# Patient Record
Sex: Male | Born: 1935 | Race: White | Hispanic: No | Smoking: Never smoker
Health system: Southern US, Community
[De-identification: ages and names within clinical notes are randomized; demographics above are authoritative.]

## PROBLEM LIST (undated history)

## (undated) DIAGNOSIS — IMO0002 Reserved for concepts with insufficient information to code with codable children: Secondary | ICD-10-CM

## (undated) DIAGNOSIS — I509 Heart failure, unspecified: Secondary | ICD-10-CM

## (undated) DIAGNOSIS — I480 Paroxysmal atrial fibrillation: Secondary | ICD-10-CM

## (undated) DIAGNOSIS — Z95810 Presence of automatic (implantable) cardiac defibrillator: Secondary | ICD-10-CM

## (undated) DIAGNOSIS — Z77090 Contact with and (suspected) exposure to asbestos: Secondary | ICD-10-CM

## (undated) DIAGNOSIS — I10 Essential (primary) hypertension: Secondary | ICD-10-CM

## (undated) DIAGNOSIS — I2510 Atherosclerotic heart disease of native coronary artery without angina pectoris: Secondary | ICD-10-CM

## (undated) DIAGNOSIS — Z950 Presence of cardiac pacemaker: Secondary | ICD-10-CM

## (undated) DIAGNOSIS — I219 Acute myocardial infarction, unspecified: Secondary | ICD-10-CM

## (undated) DIAGNOSIS — R0602 Shortness of breath: Secondary | ICD-10-CM

## (undated) DIAGNOSIS — Z9289 Personal history of other medical treatment: Secondary | ICD-10-CM

## (undated) DIAGNOSIS — I442 Atrioventricular block, complete: Secondary | ICD-10-CM

## (undated) DIAGNOSIS — I255 Ischemic cardiomyopathy: Secondary | ICD-10-CM

## (undated) DIAGNOSIS — J449 Chronic obstructive pulmonary disease, unspecified: Secondary | ICD-10-CM

## (undated) DIAGNOSIS — I4821 Permanent atrial fibrillation: Secondary | ICD-10-CM

## (undated) DIAGNOSIS — E039 Hypothyroidism, unspecified: Secondary | ICD-10-CM

## (undated) DIAGNOSIS — M1990 Unspecified osteoarthritis, unspecified site: Secondary | ICD-10-CM

## (undated) HISTORY — DX: Ischemic cardiomyopathy: I25.5

## (undated) HISTORY — PX: CORONARY ANGIOPLASTY: SHX604

## (undated) HISTORY — DX: Atrioventricular block, complete: I44.2

## (undated) HISTORY — DX: Chronic obstructive pulmonary disease, unspecified: J44.9

## (undated) HISTORY — DX: Personal history of other medical treatment: Z92.89

## (undated) HISTORY — DX: Reserved for concepts with insufficient information to code with codable children: IMO0002

## (undated) HISTORY — DX: Contact with and (suspected) exposure to asbestos: Z77.090

## (undated) HISTORY — DX: Paroxysmal atrial fibrillation: I48.0

## (undated) HISTORY — DX: Permanent atrial fibrillation: I48.21

## (undated) HISTORY — PX: CATARACT EXTRACTION W/ INTRAOCULAR LENS  IMPLANT, BILATERAL: SHX1307

## (undated) HISTORY — PX: INGUINAL HERNIA REPAIR: SUR1180

## (undated) HISTORY — DX: Presence of cardiac pacemaker: Z95.0

---

## 1994-11-01 HISTORY — PX: CORONARY ARTERY BYPASS GRAFT: SHX141

## 1998-10-05 HISTORY — PX: INSERT / REPLACE / REMOVE PACEMAKER: SUR710

## 1998-10-10 ENCOUNTER — Encounter: Payer: Self-pay | Admitting: Family Medicine

## 1998-10-10 ENCOUNTER — Inpatient Hospital Stay (HOSPITAL_COMMUNITY): Admission: EM | Admit: 1998-10-10 | Discharge: 1998-10-22 | Payer: Self-pay | Admitting: *Deleted

## 1998-10-11 ENCOUNTER — Encounter: Payer: Self-pay | Admitting: Family Medicine

## 1998-10-15 ENCOUNTER — Encounter: Payer: Self-pay | Admitting: Cardiology

## 1998-10-16 ENCOUNTER — Encounter: Payer: Self-pay | Admitting: Internal Medicine

## 1998-10-21 ENCOUNTER — Encounter: Payer: Self-pay | Admitting: Cardiology

## 1999-04-24 ENCOUNTER — Emergency Department (HOSPITAL_COMMUNITY): Admission: EM | Admit: 1999-04-24 | Discharge: 1999-04-24 | Payer: Self-pay | Admitting: Emergency Medicine

## 2000-01-25 ENCOUNTER — Encounter: Admission: RE | Admit: 2000-01-25 | Discharge: 2000-01-25 | Payer: Self-pay | Admitting: Neurosurgery

## 2000-01-25 ENCOUNTER — Encounter: Payer: Self-pay | Admitting: Neurosurgery

## 2000-05-25 ENCOUNTER — Ambulatory Visit (HOSPITAL_COMMUNITY): Admission: RE | Admit: 2000-05-25 | Discharge: 2000-05-25 | Payer: Self-pay | Admitting: Internal Medicine

## 2001-12-14 ENCOUNTER — Encounter: Payer: Self-pay | Admitting: Cardiology

## 2001-12-14 ENCOUNTER — Encounter: Admission: RE | Admit: 2001-12-14 | Discharge: 2001-12-14 | Payer: Self-pay | Admitting: Cardiology

## 2001-12-17 ENCOUNTER — Ambulatory Visit (HOSPITAL_COMMUNITY): Admission: RE | Admit: 2001-12-17 | Discharge: 2001-12-18 | Payer: Self-pay | Admitting: Cardiology

## 2001-12-17 HISTORY — PX: CORONARY ANGIOPLASTY WITH STENT PLACEMENT: SHX49

## 2002-07-23 ENCOUNTER — Ambulatory Visit (HOSPITAL_COMMUNITY): Admission: RE | Admit: 2002-07-23 | Discharge: 2002-07-23 | Payer: Self-pay | Admitting: Cardiology

## 2002-07-23 ENCOUNTER — Encounter: Payer: Self-pay | Admitting: Cardiology

## 2004-01-06 ENCOUNTER — Encounter: Admission: RE | Admit: 2004-01-06 | Discharge: 2004-01-06 | Payer: Self-pay | Admitting: Internal Medicine

## 2005-03-23 ENCOUNTER — Inpatient Hospital Stay (HOSPITAL_COMMUNITY): Admission: AD | Admit: 2005-03-23 | Discharge: 2005-03-25 | Payer: Self-pay | Admitting: *Deleted

## 2006-03-12 ENCOUNTER — Inpatient Hospital Stay (HOSPITAL_COMMUNITY): Admission: EM | Admit: 2006-03-12 | Discharge: 2006-03-17 | Payer: Self-pay | Admitting: Obstetrics and Gynecology

## 2006-03-13 ENCOUNTER — Encounter (INDEPENDENT_AMBULATORY_CARE_PROVIDER_SITE_OTHER): Payer: Self-pay | Admitting: *Deleted

## 2006-03-13 ENCOUNTER — Encounter (INDEPENDENT_AMBULATORY_CARE_PROVIDER_SITE_OTHER): Payer: Self-pay | Admitting: Cardiology

## 2006-03-15 ENCOUNTER — Encounter: Payer: Self-pay | Admitting: *Deleted

## 2006-03-16 HISTORY — PX: CARDIAC CATHETERIZATION: SHX172

## 2006-04-17 ENCOUNTER — Ambulatory Visit: Payer: Self-pay | Admitting: Internal Medicine

## 2006-04-27 ENCOUNTER — Ambulatory Visit (HOSPITAL_COMMUNITY): Admission: RE | Admit: 2006-04-27 | Discharge: 2006-04-27 | Payer: Self-pay | Admitting: Internal Medicine

## 2006-04-28 ENCOUNTER — Ambulatory Visit (HOSPITAL_COMMUNITY): Admission: RE | Admit: 2006-04-28 | Discharge: 2006-04-28 | Payer: Self-pay | Admitting: Internal Medicine

## 2006-05-08 ENCOUNTER — Ambulatory Visit: Payer: Self-pay | Admitting: Internal Medicine

## 2006-06-07 ENCOUNTER — Encounter: Admission: RE | Admit: 2006-06-07 | Discharge: 2006-06-07 | Payer: Self-pay | Admitting: Gastroenterology

## 2006-07-10 ENCOUNTER — Ambulatory Visit: Payer: Self-pay | Admitting: Internal Medicine

## 2006-07-12 ENCOUNTER — Ambulatory Visit: Payer: Self-pay | Admitting: Cardiology

## 2006-11-28 ENCOUNTER — Ambulatory Visit: Payer: Self-pay | Admitting: Internal Medicine

## 2006-12-04 ENCOUNTER — Encounter: Admission: RE | Admit: 2006-12-04 | Discharge: 2006-12-04 | Payer: Self-pay | Admitting: Cardiology

## 2006-12-07 ENCOUNTER — Ambulatory Visit (HOSPITAL_COMMUNITY): Admission: RE | Admit: 2006-12-07 | Discharge: 2006-12-07 | Payer: Self-pay | Admitting: Cardiology

## 2007-01-08 ENCOUNTER — Ambulatory Visit: Payer: Self-pay | Admitting: Internal Medicine

## 2007-01-16 ENCOUNTER — Ambulatory Visit (HOSPITAL_COMMUNITY): Admission: RE | Admit: 2007-01-16 | Discharge: 2007-01-16 | Payer: Self-pay | Admitting: Gastroenterology

## 2007-01-16 ENCOUNTER — Encounter (INDEPENDENT_AMBULATORY_CARE_PROVIDER_SITE_OTHER): Payer: Self-pay | Admitting: *Deleted

## 2007-02-22 ENCOUNTER — Encounter: Admission: RE | Admit: 2007-02-22 | Discharge: 2007-02-22 | Payer: Self-pay | Admitting: Internal Medicine

## 2007-05-02 IMAGING — CR DG CHEST 2V
2 series · 2 of 2 positions shown · non-contrast
Comparison: none

CLINICAL DATA: Post pacer; some left arm pain.  
 [id number] VIEWS:
 PA and lateral views of the chest are made and are compared to previous studies of 03/23/05 and show a new pacemaker battery pack and lead to have been inserted.  New lead is in the right ventricle.  There is another lead also in the right ventricle and one in the right atrium.  There is no pneumothorax or pleural effusion.  The patient has had previous coronary artery bypass grafts.  The heart is minimally prominent in size but no edema is present.  There is minimal blunting of the left costophrenic angle.  There is a stable compression fracture of the mid thoracic spine.

[w chest pa]
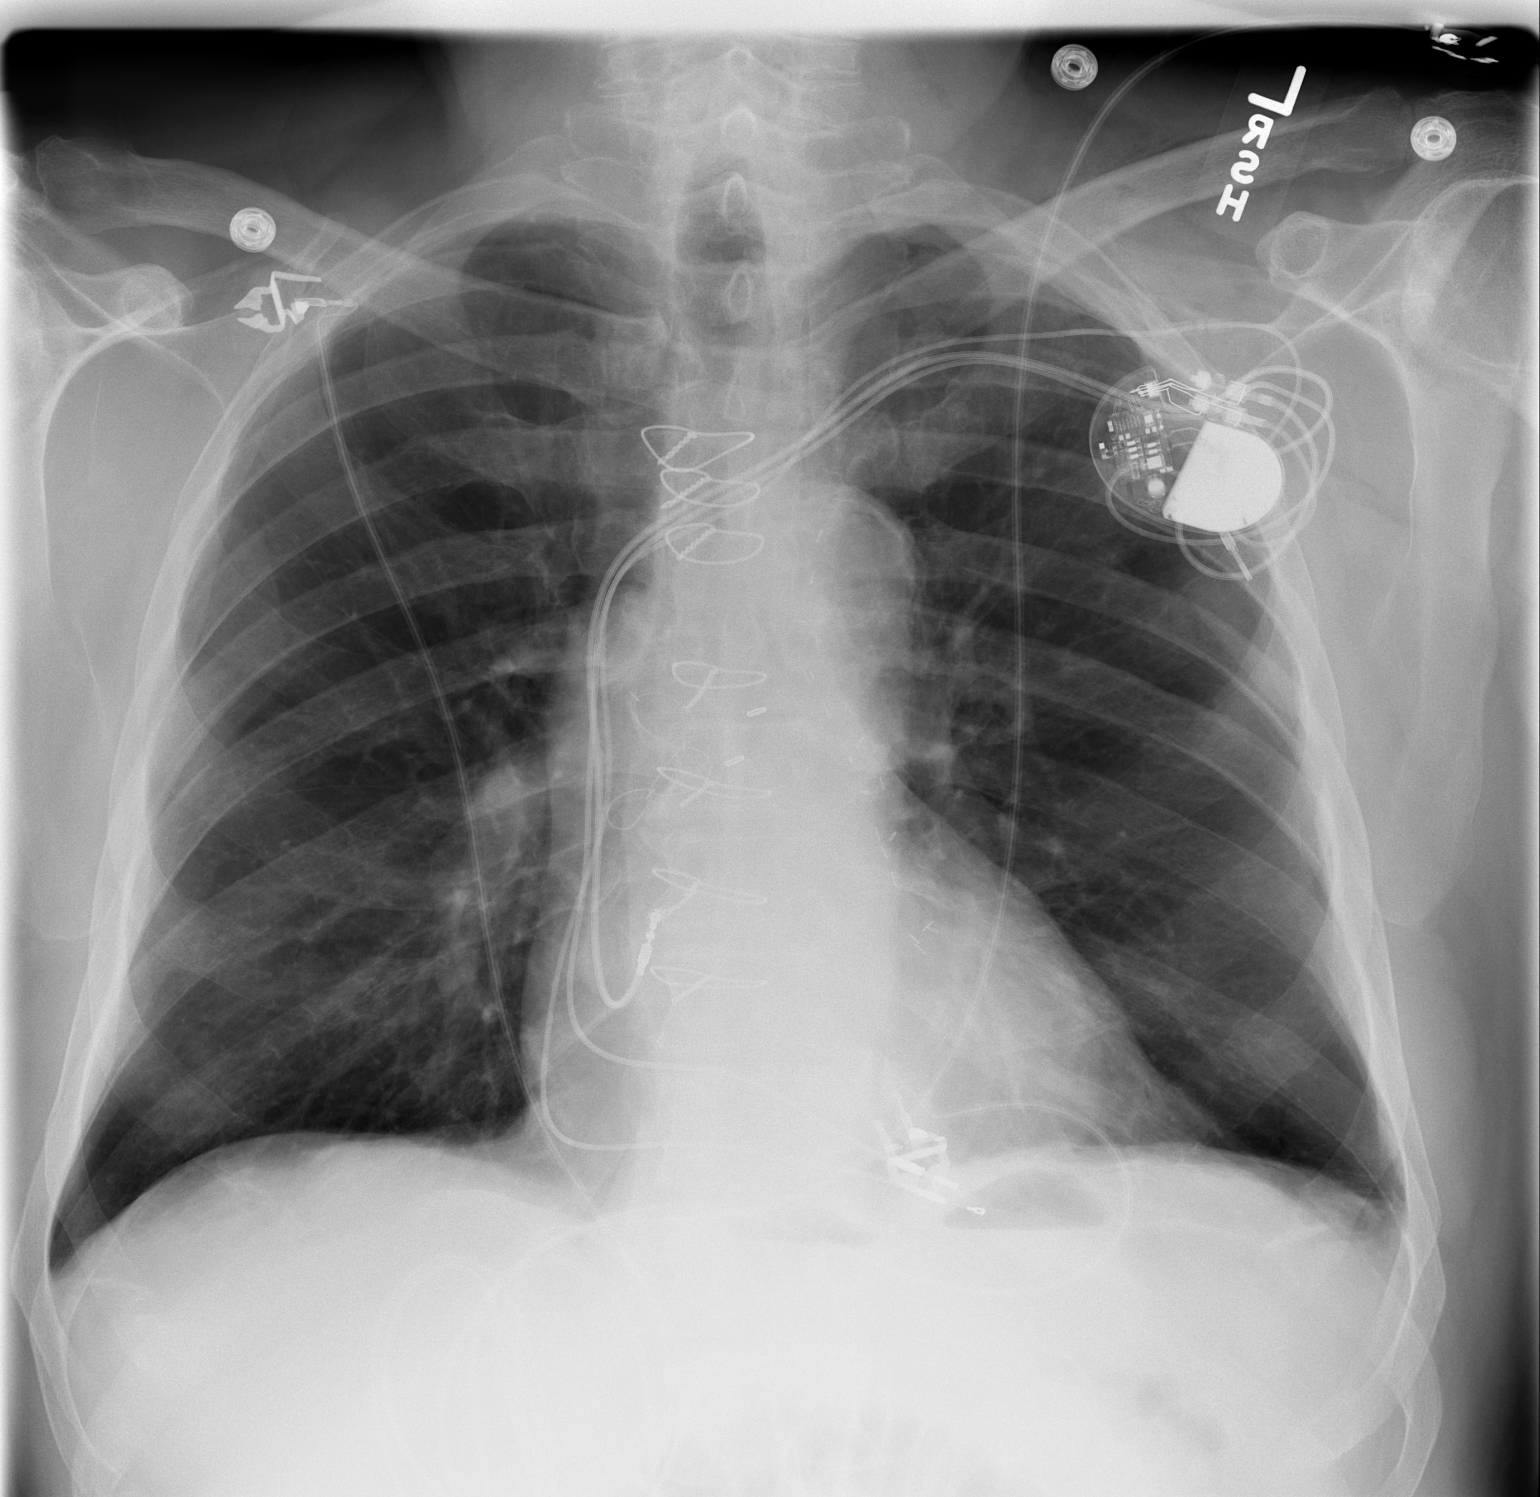

[w chest lat]
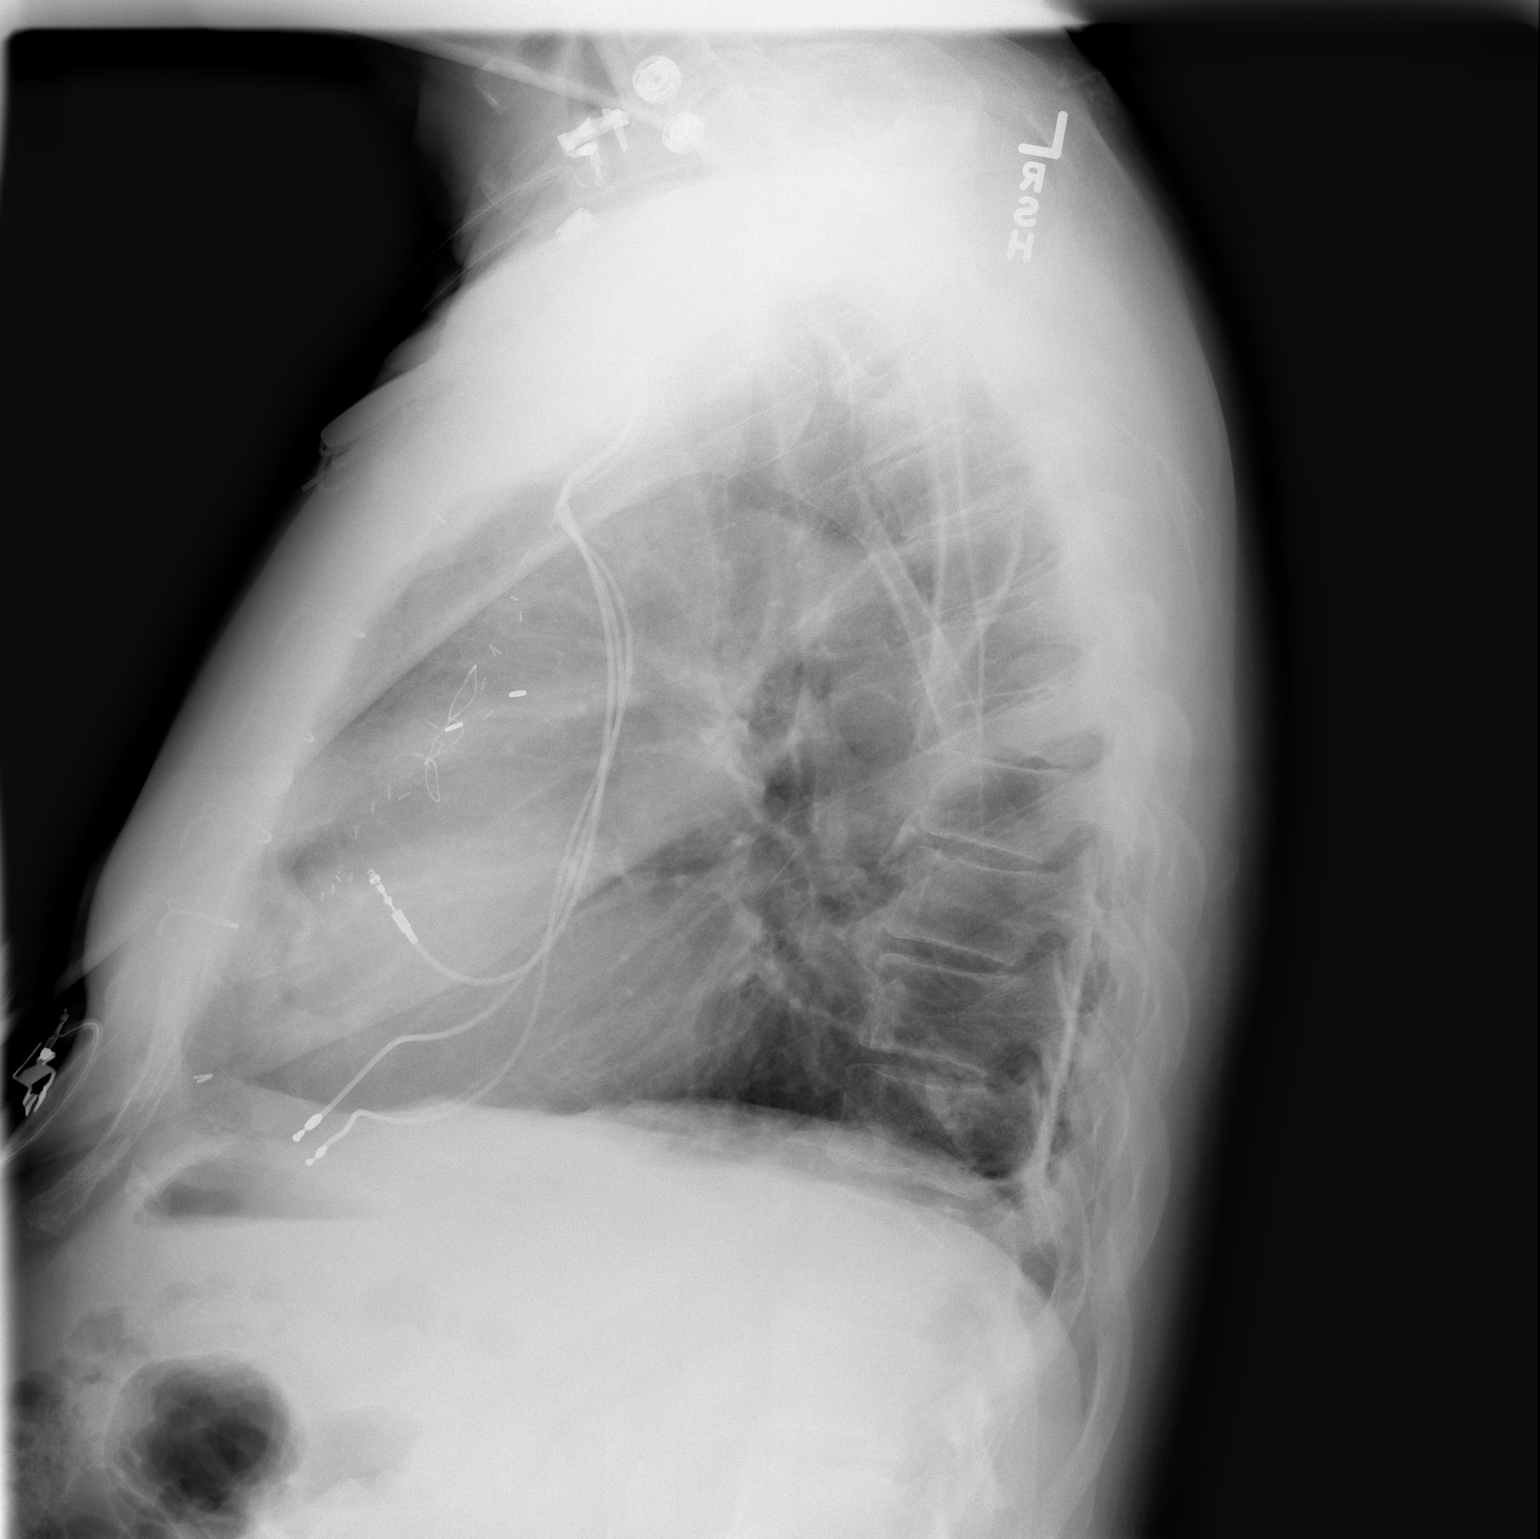

[2 of 2 positions shown; findings below may reference images not displayed]

IMPRESSION: New pacer and electrode appear to be in good position.  No definite pneumothorax.  Slight blunting, left costophrenic angle.  Stable anterior compression, mid thoracic vertebral body.

## 2007-05-14 ENCOUNTER — Ambulatory Visit: Payer: Self-pay | Admitting: Internal Medicine

## 2007-06-11 ENCOUNTER — Encounter: Admission: RE | Admit: 2007-06-11 | Discharge: 2007-06-11 | Payer: Self-pay | Admitting: Internal Medicine

## 2007-07-09 ENCOUNTER — Ambulatory Visit: Payer: Self-pay | Admitting: Internal Medicine

## 2007-09-17 ENCOUNTER — Ambulatory Visit (HOSPITAL_COMMUNITY): Admission: RE | Admit: 2007-09-17 | Discharge: 2007-09-17 | Payer: Self-pay | Admitting: Gastroenterology

## 2007-09-17 ENCOUNTER — Encounter (INDEPENDENT_AMBULATORY_CARE_PROVIDER_SITE_OTHER): Payer: Self-pay | Admitting: Gastroenterology

## 2007-10-08 ENCOUNTER — Ambulatory Visit: Payer: Self-pay | Admitting: Internal Medicine

## 2007-11-05 ENCOUNTER — Encounter: Admission: RE | Admit: 2007-11-05 | Discharge: 2007-11-05 | Payer: Self-pay | Admitting: Cardiology

## 2007-11-12 ENCOUNTER — Encounter: Payer: Self-pay | Admitting: Internal Medicine

## 2007-11-15 HISTORY — PX: CARDIAC CATHETERIZATION: SHX172

## 2007-11-21 ENCOUNTER — Encounter: Payer: Self-pay | Admitting: Internal Medicine

## 2007-11-22 ENCOUNTER — Encounter: Payer: Self-pay | Admitting: Internal Medicine

## 2007-11-26 ENCOUNTER — Encounter: Payer: Self-pay | Admitting: Internal Medicine

## 2007-11-26 ENCOUNTER — Ambulatory Visit: Payer: Self-pay | Admitting: Internal Medicine

## 2007-11-26 ENCOUNTER — Inpatient Hospital Stay (HOSPITAL_COMMUNITY): Admission: RE | Admit: 2007-11-26 | Discharge: 2007-11-28 | Payer: Self-pay | Admitting: Cardiology

## 2007-12-07 DIAGNOSIS — I2510 Atherosclerotic heart disease of native coronary artery without angina pectoris: Secondary | ICD-10-CM

## 2007-12-07 DIAGNOSIS — J45909 Unspecified asthma, uncomplicated: Secondary | ICD-10-CM | POA: Insufficient documentation

## 2007-12-07 DIAGNOSIS — J61 Pneumoconiosis due to asbestos and other mineral fibers: Secondary | ICD-10-CM

## 2007-12-07 DIAGNOSIS — R0602 Shortness of breath: Secondary | ICD-10-CM | POA: Insufficient documentation

## 2007-12-07 DIAGNOSIS — D71 Functional disorders of polymorphonuclear neutrophils: Secondary | ICD-10-CM

## 2007-12-10 ENCOUNTER — Ambulatory Visit: Payer: Self-pay | Admitting: Internal Medicine

## 2007-12-13 ENCOUNTER — Ambulatory Visit: Payer: Self-pay

## 2008-01-15 ENCOUNTER — Encounter: Payer: Self-pay | Admitting: Internal Medicine

## 2008-02-26 ENCOUNTER — Encounter: Payer: Self-pay | Admitting: Internal Medicine

## 2008-03-25 ENCOUNTER — Ambulatory Visit: Payer: Self-pay | Admitting: Internal Medicine

## 2008-06-09 ENCOUNTER — Ambulatory Visit: Payer: Self-pay | Admitting: Internal Medicine

## 2008-10-06 ENCOUNTER — Encounter: Payer: Self-pay | Admitting: Internal Medicine

## 2008-12-01 ENCOUNTER — Encounter: Admission: RE | Admit: 2008-12-01 | Discharge: 2008-12-01 | Payer: Self-pay | Admitting: Internal Medicine

## 2008-12-01 ENCOUNTER — Ambulatory Visit: Payer: Self-pay | Admitting: Internal Medicine

## 2009-01-08 ENCOUNTER — Encounter: Payer: Self-pay | Admitting: Internal Medicine

## 2009-02-23 ENCOUNTER — Ambulatory Visit: Payer: Self-pay | Admitting: Internal Medicine

## 2009-03-03 ENCOUNTER — Ambulatory Visit: Payer: Self-pay | Admitting: Internal Medicine

## 2009-03-16 ENCOUNTER — Encounter: Payer: Self-pay | Admitting: Internal Medicine

## 2009-07-02 ENCOUNTER — Encounter: Admission: RE | Admit: 2009-07-02 | Discharge: 2009-07-02 | Payer: Self-pay | Admitting: Internal Medicine

## 2009-07-02 ENCOUNTER — Ambulatory Visit: Payer: Self-pay | Admitting: Internal Medicine

## 2009-07-03 ENCOUNTER — Ambulatory Visit: Payer: Self-pay | Admitting: Internal Medicine

## 2009-07-09 ENCOUNTER — Ambulatory Visit: Payer: Self-pay | Admitting: Internal Medicine

## 2009-07-23 ENCOUNTER — Encounter: Admission: RE | Admit: 2009-07-23 | Discharge: 2009-07-23 | Payer: Self-pay | Admitting: Internal Medicine

## 2009-07-23 ENCOUNTER — Ambulatory Visit: Payer: Self-pay | Admitting: Internal Medicine

## 2009-08-24 ENCOUNTER — Encounter: Payer: Self-pay | Admitting: Internal Medicine

## 2009-08-24 ENCOUNTER — Encounter: Admission: RE | Admit: 2009-08-24 | Discharge: 2009-08-24 | Payer: Self-pay | Admitting: Internal Medicine

## 2009-08-24 ENCOUNTER — Ambulatory Visit: Payer: Self-pay | Admitting: Internal Medicine

## 2009-10-14 ENCOUNTER — Encounter: Payer: Self-pay | Admitting: Internal Medicine

## 2009-11-10 ENCOUNTER — Ambulatory Visit: Payer: Self-pay | Admitting: Internal Medicine

## 2010-01-22 ENCOUNTER — Encounter: Payer: Self-pay | Admitting: Internal Medicine

## 2010-02-01 ENCOUNTER — Ambulatory Visit: Payer: Self-pay | Admitting: Internal Medicine

## 2010-05-05 ENCOUNTER — Encounter: Payer: Self-pay | Admitting: Internal Medicine

## 2010-08-30 ENCOUNTER — Ambulatory Visit: Payer: Self-pay | Admitting: Internal Medicine

## 2010-09-08 ENCOUNTER — Encounter: Payer: Self-pay | Admitting: Internal Medicine

## 2010-10-06 ENCOUNTER — Encounter: Payer: Self-pay | Admitting: Internal Medicine

## 2010-10-06 ENCOUNTER — Encounter: Admission: RE | Admit: 2010-10-06 | Discharge: 2010-10-06 | Payer: Self-pay | Admitting: Cardiology

## 2010-10-12 ENCOUNTER — Ambulatory Visit (HOSPITAL_COMMUNITY)
Admission: RE | Admit: 2010-10-12 | Discharge: 2010-10-12 | Payer: Self-pay | Source: Home / Self Care | Admitting: Cardiology

## 2010-10-12 HISTORY — PX: CARDIOVERSION: SHX1299

## 2010-10-14 ENCOUNTER — Encounter: Payer: Self-pay | Admitting: Internal Medicine

## 2010-12-14 NOTE — Assessment & Plan Note (Signed)
Summary: rov/apc   Primary Provider/Referring Provider:  [place]/ Al Little  CC:  Follow up , pt had pacemaker replaced Jan. 2010 , and denies sob at this time.  History of Present Illness: 06/02/08-77 year old man returning for follow-up.  History of asthma/COPD, old granulomatous, pulmonary asbestosis, and coronary disease.  He has felt stable in the hot weather, with no new problems.  Pacemaker change made a big help for him as measured by his ability to go dancing.  He coughs up some phlegm from time to time, mostly in the mornings, mostly white.  He denies chest pain, palpitation, fever, purulent or bloody discharge, adenopathy, or leg edema. Pulmonary function testing in 2007 had indicated obstructive disease and his methacholine challenge test was positive, then.  His current study shows mild obstruction in small airways without response to bronchodilator.  FEV1 2.33/86%.  FEV1/FVC 0.67.  Small airway flows are at 50% of predicted.  Lung volumes are normal.  Diffusion is normal.  On a 6 minute walk test he went 516 m with saturations of 98%, 96%, 99% on room air.  This is quite good.  2010-02-20- Asthma/ COPD, old granulomatous diseae, Asbestosis, CAD Comes for F/U. Had a chest cold this winter but not full Flu syndrome. Otherwise has done very well. Says pacemaker change "cured my problems". Continues Amiodarone. Had CXR this year. Not much pollen rhinitis. Denies cough, tightness, wheeze, phlegm. Denies blood or chest pain. PFT 06/09/08- Mild bobstructive disease, FEV1/FVC, without response to BD. CXR 08/24/09- No residual infiltrate to suggest any amiodarone disease. Healing left rib fx, Stable CE, pacemaker.  Current Medications (verified): 1)  Symbicort 160-4.5 Mcg/act Aero (Budesonide-Formoterol Fumarate) .... Inhale 2 Puff Using Inhaler Twice A Day 2)  Synthroid 100 Mcg  Tabs (Levothyroxine Sodium) .... Take 1 Tablet By Mouth Once A Day 3)  Lipitor 10 Mg  Tabs (Atorvastatin  Calcium) .... Take 1 Tablet By Mouth Once A Day 4)  Vitamin E 400 Unit  Caps (Vitamin E) .... Take 1 Capsule By Mouth Once A Day 5)  Coq-10 100 Mg  Caps (Coenzyme Q10) .... Take 1 Capsule By Mouth Once A Day 6)  Lumigan 0.03 %  Soln (Bimatoprost) .... As Directed 7)  Cosopt 2-0.5 %  Soln (Dorzolamide-Timolol) .... As Directed 8)  Furosemide 80 Mg  Tabs (Furosemide) .... Take 1 Tablet By Mouth Once A Day 9)  Amiodarone Hcl 200 Mg  Tabs (Amiodarone Hcl) .... Take 1 Tablet By Mouth Once A Day 10)  Ventolin Hfa 108 (90 Base) Mcg/act  Aers (Albuterol Sulfate) .... As Needed 11)  Coumadin .... As Directed 12)  Captopril 25 Mg  Tabs (Captopril) .... 1/2 Two Times A Day 13)  Digoxin 0.125 Mg  Tabs (Digoxin) .... Once Daily 14)  Cvs Vitamin E 400 Unit  Caps (Vitamin E) .... Once Daily  Allergies (verified): No Known Drug Allergies  Past History:  Past Medical History: Last updated: 12/10/2007 ischemic cardiomyopathy-St. Jude BiV ICD Hx paroxysmal atrial fib-sinus rhythm on amiodarone COIPD Hx of asbestos exposure/ plaques on CT T7 compression fx inflammatory microdules in lung, resolved as of follow up CT 07/12/06  Family History: Last updated: 2010/02/20 Father- died MI age 43 Mother- died 25 old age  Social History: Last updated: 02/20/10 Patient never smoked.  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    Vital Signs:  Patient profile:    77 year old male Height:      71 inches Weight:      182.2 pounds BMI:     25.50 O2 Sat:      96 % on Room air Pulse rate:   102 / minute BP sitting:   110 / 68  (left arm)  Vitals Entered By: Renold Genta RCP, LPN (February 01, 2010 10:33 AM)  O2 Flow:  Room air CC: Follow up , pt had pacemaker replaced Jan. 2010 , denies sob at this time Comments Medications reviewed with patient Renold Genta RCP, LPN  February 01, 2010 10:44 AM    Physical Exam  Additional Exam:  GENERAL:  A/Ox3; pleasant & cooperative.NAD HEENT:  [place]/AT, EOM-wnl, PERRLA, EACs-clear, TMs-wnl, NOSE-clear, THROAT-clear & wnl. NECK:  Supple w/ fair ROM; no JVD; normal carotid impulses w/o bruits; no thyromegaly or nodules palpated; no lymphadenopathy. CHEST: Unlabored with bibasilar crackle. Room air sat 96%. HEART:  RRR, no m/r/g  heard (paced) ABDOMEN:  Soft & nt;  EXT: Warm bilat,  no calf pain, edema, clubbing, pulses intact Skin: no rash/lesion     Impression & Recommendations:  Problem # 1:  COPD (ICD-496) Never smoker with mild fixed small airway obstruction. We reviewed PFT. He does use Symbicort. Im not sure it is cost effective so we will try wihout it, but refill for now.  Problem # 2:  PULMONARY ASBESTOSIS (ICD-501)  Basilar crackles with previous imaging showing plaques. He had a CXR not showing interstitial disease from either amiodarone or asbedstos.  Other Orders: Est. Patient Level III (16109)  Patient Instructions: 1)  Please schedule a follow-up appointment in 1 year. 2)  We are refilling Symbicort. It would be ok to try doing without Symbicort to see if you really need. If you start noting cough, wheeze, shest tightness, then ok to restart. 3)  We will get your chest xray report to add to our file. Prescriptions: SYMBICORT 160-4.5 MCG/ACT AERO (BUDESONIDE-FORMOTEROL FUMARATE) Inhale 2 puff using inhaler twice a day  #3 x 3   Entered and Authorized by:   Waymon Budge MD   Signed  by:   Waymon Budge MD on 02/01/2010   Method used:   Print then Give to Patient   RxID:   [id number]

## 2010-12-14 NOTE — Letter (Signed)
Summary: [facility]   Imported By: Sherian Rein 09/25/2010 11:40:09  _____________________________________________________________________  External Attachment:    Type:   Image     Comment:   External Document

## 2010-12-14 NOTE — Letter (Signed)
Summary: [facility]  [facility]   Imported By: [name] Charlack 10/22/2010 11:11:51  _____________________________________________________________________  External Attachment:    Type:   Image     Comment:   External Document

## 2010-12-14 NOTE — Letter (Signed)
Summary: [facility]  [facility]   Imported By: Sherian Rein 02/15/2010 12:12:02  _____________________________________________________________________  External Attachment:    Type:   Image     Comment:   External Document

## 2010-12-14 NOTE — Letter (Signed)
Summary: [facility]  [facility]   Imported By: Sherian Rein 05/13/2010 07:51:42  _____________________________________________________________________  External Attachment:    Type:   Image     Comment:   External Document

## 2010-12-16 NOTE — Letter (Signed)
Summary: [facility]  [facility]   Imported By: [name] Chester 11/01/2010 08:39:07  _____________________________________________________________________  External Attachment:    Type:   Image     Comment:   External Document

## 2010-12-28 ENCOUNTER — Ambulatory Visit: Payer: Medicare Other | Admitting: Internal Medicine

## 2010-12-28 DIAGNOSIS — I2589 Other forms of chronic ischemic heart disease: Secondary | ICD-10-CM

## 2010-12-28 DIAGNOSIS — J209 Acute bronchitis, unspecified: Secondary | ICD-10-CM

## 2011-01-17 ENCOUNTER — Other Ambulatory Visit: Payer: Medicare Other | Admitting: Internal Medicine

## 2011-01-25 LAB — PROTIME-INR: Prothrombin Time: 28.3 seconds — ABNORMAL HIGH (ref 11.6–15.2)

## 2011-01-31 ENCOUNTER — Other Ambulatory Visit: Payer: Self-pay | Admitting: Dermatology

## 2011-03-29 NOTE — Op Note (Signed)
[name], [name]               ACCOUNT NO.:  [id number]   MEDICAL RECORD NO.:  [id number]          PATIENT TYPE:  OIB   LOCATION:  2037                         FACILITY:  [facility]   PHYSICIAN:  Doylene Canning. Ladona Ridgel, MD    DATE OF BIRTH:  Sep 21, 1926   DATE OF PROCEDURE:  11/27/2007  DATE OF DISCHARGE:                               OPERATIVE REPORT   PROCEDURE PERFORMED:  Implantation of a biventricular implantable cardio-  defibrillator with ICD pocket revision.   INDICATION FOR PROCEDURE:  The patient is an 77 year old male with  longstanding complete heart block and ischemic cardiomyopathy and  congestive heart failure, who had worsening symptoms over the last  several weeks.  He was admitted to hospital where he was found to have  congestive heart failure, but for the most part patent grafts.  He has  chronic renal insufficiency.  He is now referred for upgrade from his  dual-chamber pacemaker to his biventricular ICD.   PROCEDURE IN DETAIL:  After informed consent was obtained, the patient  was taken to the diagnostic EP lab in a fasting state.  After the usual  preparation and draping, intravenous fentanyl and midazolam was given  for sedation.  Next, 30 mL of lidocaine was infiltrated in the left  infraclavicular region.  A 7-cm incision was carried out over this  region and electrocautery was utilized to dissect down to the fascial  plane..  Care was taken not to enter the pacemaker pocket.  Next, 10 mL  of contrast was injected into the left upper extremity venous system  demonstrating a patent left subclavian vein.  It was subsequently  punctured x2 and the St. Jude, model 7121, 65-cm active fixation  defibrillation lead, serial number [id number], was advanced into the right  ventricle.  Mapping was carried out in the final site on the RV septum  and the R-waves measured 15.  Of note, at other locations the R-waves  were much decreased.  With these satisfactory parameters and with  the  lead actively fixed and threshold of 0.6 volts at 0.5 milliseconds, the  guiding EP catheter was advanced into the right atrium and the coronary  sinus was cannulated without particular difficulty.  Venography was  carried out in the coronary sinus demonstrating a posterior vein which  coursed laterally as well as a lateral vein that was fairly high lateral  and which was small.  Initial attempts to cannulate the lateral vein  were successful and a guidewire was advanced throughout the vein,  through the anastomoses and out actually back into the coronary sinus.  The initial attempts to place the [facility]. Jude quick flex 1156 passive  fixation lead were unsuccessful because of the small diameter of the  vein itself.  At this point, the lead was removed from the vein and the  guiding catheter withdrawn back into the portion near the os of the  coronary sinus.  In this location, the posterior vein was cannulated  without difficulty and advanced laterally onto the posterolateral wall  of left ventricle, a distance approximately two-thirds from base to  apex.  In this location, the LV threshold was 18, the impedance 480 ohms  and the threshold 2 V at 0.5 ms.  It was noted that the LV waves were  18.  The 10-volt pacing in this location did not stimulate the  diaphragm.  With both RV and LV leads in satisfactory position, they  were secured to the subpectoralis fascia with a figure-of-eight silk  suture.  The sewing sleeve was also secured with a silk suture.  At this  point, the previous subcutaneous pocket was entered and the old St. Jude  dual-chamber pacemaker was removed.  The old RV lead was capped.  The  atrial lead was evaluated and working satisfactorily with P-waves of 3  in a threshold 0.75 at 0.5, and at this point the pocket was irrigated  and electrocautery was utilized to assure hemostasis.  At this point,  having done so, the pocket was revised to accommodate the new   defibrillator lead, the new LV pacing lead and the new defibrillator  itself.  Having accomplished this, the St. Jude Promote RF, model [id number],  biventricular  ICD, serial number [id number], was advanced and connected to  the old right atrial, the new defibrillator lead in the RV and the new  LV lead and placed back in the subcutaneous pocket.  The pocket was then  irrigated with kanamycin.  Defibrillation threshold testing was carried  out.   After the patient was more deeply sedated with fentanyl and Versed, the  VF was induced with T-wave shock and a 15-joule shock which terminated  VF and restored sinus rhythm.  At this point, no additional  defibrillation threshold testing was carried out and the incision was  closed with a layer of 2-0 Vicryl, followed by a layer of 3-0 Vicryl.  Benzoin was painted on the skin, Steri-Strips were applied, a pressure  dressing was placed, and the patient was returned to his room in  satisfactory condition.   COMPLICATIONS:  There were no immediate complications.  Full results  demonstrated successful implantation of a St. Jude biventricular ICD,  utilizing a previously implanted right atrial pacing lead without  immediate procedure complication.      Doylene Canning. Ladona Ridgel, MD  Electronically Signed     GWT/MEDQ  D:  11/27/2007  T:  11/27/2007  Job:  [id number]   cc:   Thereasa Solo. Little, M.D.

## 2011-03-29 NOTE — Discharge Summary (Signed)
NAMETYDUS, SANMIGUEL               ACCOUNT NO.:  [id number]   MEDICAL RECORD NO.:  [id number]          PATIENT TYPE:  INP   LOCATION:  2037                         FACILITY:  [facility]   PHYSICIAN:  [name], P.A.   DATE OF BIRTH:  [birth year]   DATE OF ADMISSION:  11/26/2007  DATE OF DISCHARGE:  11/28/2007                               DISCHARGE SUMMARY   DISCHARGE DIAGNOSES:  1. Ischemic cardiomyopathy, status post upgrade to [facility]. Jude BiV ICD      this admission for worsening heart failure.  2. Coronary disease with history of coronary artery bypass grafting in      1995, catheterization this admission revealing patent grafts.  3. History of paroxysmal atrial fibrillation, sinus rhythm on      amiodarone.  4. History of chronic obstructive pulmonary disease with prior      asbestos exposure followed by Dr. Fannie Knee.   HOSPITAL COURSE:  Mr. [name] is a pleasant 77 year old male who was  seen by Dr. Clarene Duke in the office on November 22, 2007.  He had been having  increasing dyspnea and some chest pressure.  His previous EF had been in  the 35% range.  He was admitted for diagnostic catheterization and  further evaluation.  He was also seen in consult by the EP service for  evaluation of possible BiV ICD upgrade.  The patient had had a pacemaker  implanted in the past for heart block.  Catheterization was done by Dr.  Jenne Campus on November 26, 2007.  His grafts were patent.  His EF is 20-25%.  The EP service feels he has New York Heart Association class II to III  heart failure.  He was seen in consult by Dr. Ladona Ridgel.  His Coumadin had  been held prior to admission.  We went ahead and proceeded with a pacer  change out and upgrade to a BiV ICD with a St. Jude device.  This was  done on November 27, 2007.  The patient tolerated the procedure well.  We  feel he can be discharged on November 28, 2007.   DISCHARGE MEDICATIONS:  1. Amiodarone 200 mg a day.  2. Coreg 3.125 mg twice a  day.  3. Symbicort 2 puffs b.i.d.  4. Ventolin inhaler as taken at home.  5. Xopenex inhaler p.r.n.  6. Ipratropium as taken at home.  7. Spiriva daily.  8. Lasix 80 mg a day.  9. Potassium 10 mEq a day  10.Synthroid 0.1 mg a day.  11.Lipitor 10 mg a day.  12 . Coumadin 5 mg a day.   FOLLOWUP:  Dr. Ladona Ridgel would like to see him in follow up for a site  check on December 13, 2007 at 9:20, and then he will see Dr. Ladona Ridgel on  March 06, 2008.  He will follow up with Dr. Clarene Duke in a couple weeks.   LABORATORY DATA:  White count 4.7, hemoglobin 11.9, hematocrit 35.3,  platelets 126.  Sodium 139, potassium 3.4, BUN 20, creatinine 1.38.  BNP  is 1137.  TSH 5.5.  Chest x-ray shows no pneumothorax at discharge.  INR  at admission was 1.5.   DISPOSITION:  The patient is discharged in stable condition.  We did add  Lanoxin.  He can resume his Coumadin tonight and will get a pro time in  a week.  Keflex was added at discharge by the EP service.      [name], P.A.     LKK/MEDQ  D:  11/28/2007  T:  11/28/2007  Job:  [id number]   cc:   Doylene Canning. Ladona Ridgel, MD

## 2011-03-29 NOTE — Op Note (Signed)
NAME:  [name], [name]               ACCOUNT NO.:  [id number]   MEDICAL RECORD NO.:  [id number]          PATIENT TYPE:  AMB   LOCATION:  ENDO                         FACILITY:  [facility]   PHYSICIAN:  Petra Kuba, M.D.    DATE OF BIRTH:  11-Nov-1926   DATE OF PROCEDURE:  DATE OF DISCHARGE:                               OPERATIVE REPORT   PROCEDURES:  Colonoscopy and polypectomy.   INDICATION:  A patient with history of multiple difficult to remove  polyps due for repeat screening.  Consent was signed after risks,  benefits, methods, options thoroughly discussed multiple times in the  past.   MEDICINES USED:  Fentanyl 75 mcg, Versed 10.5 mg.   PROCEDURE IN DETAIL:  Rectal inspection is pertinent for external  hemorrhoids, small.  Digital exam is negative.  The video colonoscope  was inserted easily and advanced around the colon to the cecum.  This  did require some abdominal pressure but no position changes.  No  worrisome lesions were seen on insertion.  A moderate number of  diverticula in both left and right were seen on insertion.  The cecum  was identified by the appendiceal orifice and the ileocecal valve.  On  slow withdrawal through the colon probably 5 small polyps and one medium  sessile polyp were all snared in the right side of the colon.  All were  put in the same container.  All pieces removed were suctioned through  the scope and collected in the trap.  We did piecemeal the moderate  ascending one which was along the poles with 3 small snares.  The scope  was further withdrawn.  In the transverse approximately 5 other small  polyps were seen, one was a sessile one possibly even residual from one  of the previous polypectomies and all were snared and two were hot  biopsied including the sessile one and put in a second container.  No  worrisome lesions were seen as we slowly withdrew back to the rectum.  We did look at the right side with him in the transverse and several  on  his back and his left side.  No additional polyps or significant  findings were seen.  The scope was slowly withdrawn.  Anal rectal pull  through and retroflexion back in the rectum confirmed some small  hemorrhoids.  Scope was straightened and readvanced a short ways up the  left side of the colon.  Air was suctioned.  Scope was removed.  The  patient tolerated the procedure well.  There was no obvious immediate  complication.   ENDOSCOPIC DIAGNOSES:  1. Internal and external small hemorrhoids.  2. Left and right moderate diverticula.  3. Multiple small with two medium sized sessile polyps status post      multiple snares and a few hot biopsies in both the transverse and      the ascending colon.  4. Otherwise within normal limits to the cecum.   PLAN:  Await pathology and consider repeat colonic screening pending his  medical problems, otherwise, observe for delayed complications and if  none, slowly  advanced Coumadin, watch for signs of bleeding and happy to  see back p.r.n.           ______________________________  Petra Kuba, M.D.     MEM/MEDQ  D:  09/17/2007  T:  09/17/2007  Job:  [id number]   cc:   Petra Kuba, M.D.  Thereasa Solo. Little, M.D.  Luanna Cole. Lenord Fellers, M.D.

## 2011-03-29 NOTE — Assessment & Plan Note (Signed)
[facility]                             PULMONARY OFFICE NOTE   NAME:Sipos, ZIGMOND TRELA                      MRN:          [id number]  DATE:07/09/2007                            DOB:          09-Sep-1926    PROBLEM:  1. Dyspnea.  2. Pulmonary asbestosis.  3. Coronary disease/bypass graft/Coumadin.  4. Old granulomatous disease.  5. Asthma with COPD.  6. Atrial fibrillation/Coumadin/pacemaker.   HISTORY:  He had an episode of asthmatic bronchitis about a month ago  and went first to Urgent Care and then to Dr. Lenord Fellers, getting two rounds  of antibiotics. Now his nebulizer machine using Xopenex and ipratropium  but has not been using it recently.  Dr. Lenord Fellers did a chest x-ray about  a month ago.  He feels his chest has cleared from that acute episode.   MEDICATIONS:  His list is reviewed and charted noting he continues  Symbicort 160/4.5, Ventolin HFA and he does have a nebulizer machine  with Xopenex and ipratropium if needed.  He is on amiodarone.  Nobody  has told him he shows any signs of pulmonary toxicity so far.   OBJECTIVE:  Weight 174 pounds. Blood pressure 110/58, pulse 88, room air  saturation 94%, pulse is regular.  CHEST:  Sounds clear.  /NECK:  There is no neck vein distention or edema.  CARDIAC:  Heart sounds are regular without murmur.   IMPRESSION:  1. Asthma/COPD.  2. History of atrial fibrillation/pacemaker/coronary disease/Coumadin.   PLAN:  1. Medication talk. We reviewed appropriate use of his nebulizer      machine.  2. Scheduled to return in 6 months, earlier p.r.n.  3. Chest x-rays should be watched for evidence of progressive      interstitial disease that might reflect his amiodarone.     Clinton D. Maple Hudson, MD, Tonny Bollman, FACP  Electronically Signed   CDY/MedQ  DD: 07/16/2007  DT: 07/16/2007  Job #: [id number]   cc:   Luanna Cole. Lenord Fellers, M.D.  Thereasa Solo. Little, M.D.

## 2011-03-29 NOTE — Assessment & Plan Note (Signed)
[facility]                             PULMONARY OFFICE NOTE   NAME:[name], [name]                      MRN:          [id number]  DATE:05/14/2007                            DOB:          November 13, 1926    HISTORY OF PRESENT ILLNESS:  This is a 77 year old white male patient of  Dr. Maple Hudson who has a known history of pulmonary asbestosis, asthma with  COPD who presents today for an acute office visit.  Patient complains of  a 5-day history of increased wheezing, dry cough, and shortness of  breath.  Patient denies any fever, purulent sputum, chest pain,  orthopnea, PND, or leg swelling.   PAST MEDICAL HISTORY:  Reviewed.   CURRENT MEDICATIONS:  Reviewed.   PHYSICAL EXAMINATION:  Patient is a pleasant male in no acute distress.  He is afebrile with stable vital signs.  His O2 saturation is 99% on  room air.  HEENT:  Unremarkable.  NECK:  Supple without cervical adenopathy.  No JVD.  LUNGS:  Sounds reveal diminished breath sounds at the bases.  CARDIAC:  Regular rate.  ABDOMEN:  Soft and non-tender.  EXTREMITIES:  Warm without any edema.   IMPRESSION AND PLAN:  Mild asthmatic flare.  Patient is given a Xopenex  nebulizer treatment in the office.  Add in Mucinex DM twice daily, and  Depo-Medrol 80 mg IM was given today.  Patient is to return back with  Dr. Maple Hudson as scheduled, or sooner if needed.  Patient will contact our  office for sooner followup if symptoms do not improve or worsen.      Rubye Oaks, NP  Electronically Signed      Clinton D. Maple Hudson, MD, Tonny Bollman, FACP  Electronically Signed   TP/MedQ  DD: 05/15/2007  DT: 05/15/2007  Job #: [id number]

## 2011-03-29 NOTE — Assessment & Plan Note (Signed)
[facility]                             PULMONARY OFFICE NOTE   NAME:Agyeman, SENDER RUEB                      MRN:          [id number]  DATE:10/08/2007                            DOB:          18-Feb-1926    PROBLEM:  1. Dyspnea.  2. Pulmonary asbestosis.  3. Coronary disease/bypass graft/Coumadin.  4. Old granulomatous disease.  5. Asthma with chronic obstructive pulmonary disease.  6. Atrial fibrillation/Coumadin/pacemaker.   HISTORY:  He has been going dancing at 2 different places each weekend,  once on Friday night, once on Saturday night.  Last week he felt a  distinct increase in shortness of breath after dancing on Saturday  night.  He got a nebulizer treatment, next day he again felt somewhat  tight then raspy in the throat, developed progressive cough and went to  Dr. Lenord Fellers.  He is taking a prednisone taper and Levaquin.  Still feels  tight, can not sleep lying flat.  He denies any sense of reflux or  postnasal drainage.  Sitting here in my exam room he feels okay now.  Daughter implies that his short term exacerbations happen every month or  so and they suspect it might be something in one of the buildings where  he dances.   MEDICATIONS:  1. Coreg 3.125 mg b.i.d.  2. Synthroid 100 mcg.  3. Lipitor 10 mg.  4. Vasotek 10 mg times one-half.  5. Vitamins.  6. Coumadin.  7. Lumigan eye drops.  8. Cosopt eye drops.  9. Symbicort 160/4.5 one b.i.d.  10.Furosemide 80 mg.  11.Potassium.  12.Amiodarone 200 mg.  13.Folbic.  14.Ventolin HFA 2 puffs q.i.d. p.r.n.  15.Home nebulizer with Xopenex and ipratropium q.i.d. p.r.n.   NO MEDICATION ALLERGY.   OBJECTIVE:  Weight 181 pounds, blood pressure 110/58, pulse 70, room air  saturation 98%.  CHEST:  Clear, worker breathing is not increased.  There is no neck vein distention or edema.  His methacholine inhalation  challenge test had been negative for hyperreactive airways.   PLAN:  1. We  will ask Dr. Caprice Kluver to consider alternatives to his Jiles Harold      because of his recurrent pattern of chest tightness and shortness      of breath, but I think that the real problem is his dancing.  2. Try Spiriva.  3. I have suggested that he stop dancing on the day that he suspects      may be triggering him.  We discussed the availability of various      inhaled medication choices.  We are going to try Spiriva once      daily, he will schedule followup in about 6 months.     Clinton D. Maple Hudson, MD, Tonny Bollman, FACP  Electronically Signed    CDY/MedQ  DD: 10/08/2007  DT: 10/09/2007  Job #: [id number]   cc:   Luanna Cole. Lenord Fellers, M.D.  Thereasa Solo. Little, M.D.

## 2011-03-29 NOTE — Assessment & Plan Note (Signed)
[facility]                         ELECTROPHYSIOLOGY OFFICE NOTE   NAME:Rivero, JAZIEL BENNETT                      MRN:          [id number]  DATE:03/25/2008                            DOB:          1926-05-14    HISTORY:  Mr. Otten returns today for followup.  He is a very  pleasant man with a history of ischemic cardiomyopathy, congestive heart  failure, complete heart block and severe LV dysfunction who is status  post BIV/ICD insertion which was carried out back in January.  At that  time, he had an upgrade of his St. Jude pacemaker to a St. Jude BIV/ICD.  He returns today for followup.  He is doing well.  His heart failure has  improved.  He has gone back to fishing and recently got back from a trip  striker fishing in [place].  He had no specific complaints today.  His defibrillator site has healed nicely.   MEDICATIONS:  1. Captopril 20 mg half tablet twice a day.  2. Furosemide 80 a day.  3. Digoxin 0.125 daily.  4. Aspirin 81 mg daily.  5. Potassium 2 tablets daily.  6. Spiriva daily.   PHYSICAL EXAMINATION:  GENERAL:  He is a pleasant, elderly-appearing man  in no acute distress.  VITAL SIGNS:  Blood pressure was 124/64, pulse 72 and regular,  respirations were 18, weight was 177 pounds.  NECK:  Revealed no jugular venous distention.  LUNGS:  Clear bilaterally to auscultation.  No wheezes, rales or rhonchi  are present.  CARDIOVASCULAR:  Regular rate and rhythm.  Normal S1-S2.  His ICD  incision site was healed nicely.  The PMI was enlarged and laterally  displaced.  ABDOMINAL:  Soft, nontender.  EXTREMITIES:  Demonstrated no cyanosis, clubbing or edema.  Pulses were  2+ and symmetric.   Interrogation of his defibrillator demonstrates a St. Jude Promote.  P-  waves were 3.  There are no R-waves secondary to complete heart block.  The impedance is 340 in the A and 410 in the V, 510 in the LV, threshold  0.75 at 0.5 in the right  atrium, 0.5 at 0.5 in the RV and 1.5 at 0.7 in  the LV.  Today, we turned his outputs down in the RV to 2.5 at 0.5 in  the RV and 2.5 at 0.7 in the LV to maximize battery longevity.   IMPRESSION:  1. Ischemic cardiomyopathy.  2. Congestive heart failure.  3. Complete heart block.  4. Status post biventricular implantable cardioverter-defibrillator      insertion.   DISCUSSION:  Overall, Mr. Maberry is stable.  His defibrillator is  working normally.  We will see him back in the office on a p.r.n. basis.     Doylene Canning. Ladona Ridgel, MD  Electronically Signed    GWT/MedQ  DD: 03/25/2008  DT: 03/25/2008  Job #: [id number]   cc:   Thereasa Solo. Little, M.D.

## 2011-03-29 NOTE — Cardiovascular Report (Signed)
NAMEMAY, OZMENT               ACCOUNT NO.:  [id number]   MEDICAL RECORD NO.:  [id number]          PATIENT TYPE:  OIB   LOCATION:  2037                         FACILITY:  [facility]   PHYSICIAN:  Darlin Priestly, MD  DATE OF BIRTH:  [birth year]   DATE OF PROCEDURE:  11/26/2007  DATE OF DISCHARGE:                            CARDIAC CATHETERIZATION   PROCEDURES:  1. Left heart catheterization.  2. Coronary angiography.  3. Left ventriculogram.  4. Saphenous vein graft angiography.  5. Left internal mammary angiography.   SURGEON:  Darlin Priestly, MD.   COMPLICATIONS:  None.   INDICATIONS:  Mr. Bartolomei is an 77 year old male patient of Dr. Caprice Kluver, Dr. Luanna Cole. Baxley, Dr. Fannie Knee with a history of paroxysmal  atrial fibrillation with underlying complete heart block, now status  post dual-chamber pacer implant, history of ischemic cardiomyopathy, EF  approximately 35%, history of asbestos exposure with COPD who has  recently complained of increasing shortness of breath.  He is status  post coronary bypass surgery consisting of LIMA to LAD, vein graft to  diagonal, vein graft to ramus intermedius, vein graft to RCA.  He has  undergone previous stenting of the vein graft to the diagonal.  He is  now referred for repeat catheterization to reassess his grafts secondary  to his increasing shortness of breath.   DESCRIPTION OF PROCEDURE:  After getting informed written consent, the  patient was brought to the cardiac cath lab.  Right groin shaved,  prepped and draped in usual sterile fashion.  ECG monitoring was  established.  Using modified Seldinger technique, a #6-French arterial  sheath inserted in the femoral artery.  A 6-French diagnostic catheter  was used to perform diagnostic angiography.   Left main is a medium-size vessel with no significant disease.   The LAD is a medium-size vessel which gives rise to one diagonal branch  and is totally occluded.  The LAD  fills in the middle and distal segment  via patent LIMA.  There is no significant disease in the IMA or distal  IMA insertion.   First diagonal is a medium-size vessel which bifurcates distally with no  significant disease.   There is a second diagonal which fills via patent saphenous vein graft.  There is a stent noted in the midportion of the vein graft.  There is  mild 30% in-stent restenosis.  There is no further significant disease  in the body of the graft or distal graft insertion.   Left circumflex is a small vessel which gives rise to one small obtuse  marginal branch and is totally occluded.  The first OM is a small vessel  with an 80% proximal lesion.   There is a patent vein graft which inserts into the midportion of the  ramus intermedius.  The ramus intermedius then bifurcates.  There is no  significant disease in the body of the graft or the distal graft  insertion.   The right coronary artery is totally occluded in its mid segment.  There  is an 80% long midvessel stenosis.  There is a  patent vein graft which  fills the distal portion of the RCA as well as the PDA and  posterolateral branch.  The vein graft is widely patent.  PDA is a  medium-size vessel which bifurcates in the mid segment and no  significant disease.   The PLA is a small to medium size vessel with 70% midvessel lesion which  is unchanged.   Left ventriculogram reveals a severely dilated left ventricle with an EF  approximately 25% with global hypokinesis.   HEMODYNAMICS:  Systemic arterial pressure 102/52, LV systemic pressure  102/11, LVEDP of 30.   CONCLUSION:  1. Significant three-vessel coronary artery disease.  2. Patent left internal mammary artery to left anterior descending      with no significant disease in the internal mammary artery or      distal internal mammary artery insertion.  3. Patent vein graft to the diagonal with mild 30% in-stent restenosis      but no further  significant disease in the graft or distal graft      insertion.  4. Patent vein graft to the ramus intermedius with no significant      disease in the graft or distal graft insertion.  5. Patent vein graft to the right coronary artery with 70% mid      posterolateral lesion which is unchanged.  6. Severely depressed left ventricular function.  7. Elevated left ventricular end diastolic pressure.      Darlin Priestly, MD  Electronically Signed     RHM/MEDQ  D:  11/26/2007  T:  11/26/2007  Job:  [id number]   cc:   Thereasa Solo. Little, M.D.  Luanna Cole. Lenord Fellers, M.D.  Clinton D. Maple Hudson, MD, FCCP, FACP

## 2011-04-01 NOTE — Discharge Summary (Signed)
[name], [name]               ACCOUNT NO.:  [id number]   MEDICAL RECORD NO.:  [id number]          PATIENT TYPE:  INP   LOCATION:  2027                         FACILITY:  [facility]   PHYSICIAN:  Luanna Cole. Lenord Fellers, M.D.   DATE OF BIRTH:  1926-02-05   DATE OF ADMISSION:  03/12/2006  DATE OF DISCHARGE:  03/17/2006                                 DISCHARGE SUMMARY   DISCHARGE DIAGNOSES:  1.  Congestive heart failure secondary to ischemic myopathy.  2.  Coronary artery disease, patent grafts.  3.  History of second-degree heart block requiring pacemaker insertion      status post syncopal episode.  Episode December 1999.  4.  Chronic obstructive pulmonary disease.  5.  Possible asbestoses.  6.  Abdominal distention ? partial small bowel obstruction - resolved.   CONSULTANTS:  1.  Thereasa Solo. Little, M.D.  2.  Bernette Redbird, M.D.   BRIEF HISTORY:  This 77 year old white male with history of hypertension,  coronary artery disease status post coronary artery bypass graft x4 in 1995,  syncope secondary to AV nodal disease requiring pacemaker insertion in 1999,  called early this morning complaining of abdominal distention since March 10, 2006.  Was advised to present to the emergency department for further  evaluation.  He has had no vomiting, but has had decreased appetite.  He had  a normal movement April 27.  No melena.  Had small bowel movement on the day  of admission, but ate and drank very little the day prior to admission.  In  the emergency department, he had bowel sounds, but had marked abdominal  distention.  No significant tenderness to palpation.  He had some end  expiratory wheezing bilaterally and decreased breath sounds bilaterally,  with some crackles in the lung bases.  His sodium was noted to be 127.  His  white count was normal, and his hemoglobin was 12.2 g.  He has never had a  colonoscopy (had refused to do this in the past).  Hemoglobin in July 2006  was 13.0 g.  He  says he has not been exercising quite as much due to a right  knee injury 3 weeks ago.  He is on chronic Coumadin therapy.  His ejection  fraction in 2003, at the time of his last cardiac catheterization was noted  to be 35-40%.  At the time, a saphenous graft was stented with a non drug-  eluting stent.  The pacemaker was changed out in 2006.  He has a history of  right carotid bruit.  Doppler study in 2001 showed mild plaque.  He has a  history of allergic rhinitis, with positive allergy testing to ragweed, tree  pollen and mold.  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  History of  hypothyroidism.  An inferior myocardial infarction in 1992 and had  angioplasty of the right coronary artery in April 1992, July 1992 and  September 1992.  He had angioplasty of the circumflex artery in November  1993 and coronary artery bypass surgery with 4  grafts in 1995.  Right  inguinal hernia repair in 1997.  Stent to the saphenous vein graft was done  in 2003.   CURRENT MEDICATIONS:  1.  Vasotec 10 mg daily.  2.  Norvasc 10 mg daily.  3.  Coumadin 5 mg daily, except 2.5 mg on Tuesdays and Saturdays.  4.  Maxzide 25 daily.  5.  Synthroid 0.05 mg daily.  6.  Lipitor 10 mg daily.  7.  Toprol XL 25 mg daily.  8.  P.r.n. Viagra or Cialis.   IMMUNIZATIONS:  1.  Pneumovax immunization given in June 2004.  2.  Tetanus immunization done in June 1997.   PAST MEDICAL HISTORY:  Please see dictated history and physical exam.   ADDITIONAL PAST MEDICAL HISTORY:  Please see dictated history and physical  exam.   SOCIAL HISTORY:  Please see dictated history and physical exam.   FAMILY HISTORY:  Please see dictated history and physical exam.   HOSPITAL COURSE:  The patient was admitted to the hospital for further  evaluation and observation.  He was found to have very small bilateral  pleural effusions on CT scan and diverticulosis of the  colon without  diverticulitis.  The perforation of the intestines was noted.  No  hydronephrosis or focal renal mass.  There was some fatty changes in the  head of the pancreas.  Calcified granulomata noted in the liver.  Pelvic CT  showed a normal appendix and diverticulosis of the sigmoid colon.  However,  the patient was noted to be markedly distended and a GI consultation was  called.  Dr. Evette Cristal, on call for Dr. Matthias Hughs, saw the patient, and basically  just recommended observation.  Abdominal ultrasound was done on March 13, 2006 to assess for possible ascites, but no ascites was noted.  The patient  was reassessed on the evening of admission and was found to have a PO2 of 88  on 2 liters of oxygen.  He had slight JVD at 45 degrees.  I gave him 20 mg  of IV Lasix.  Cardiac enzymes were checked and proved to be negative for  myocardial infarction.  We held Vasotec and gave him Toprol.  He had slight  increase SGOT and SGPT, which was new since the last time we did lab work in  the office.  He had carotid Doppler studies on March 13, 2006, showing no  significant IC stenosis.  The next morning he felt better and had passed a  lot of gas.  The breathing was better after having had Lasix.  He tolerated  clear liquids okay and was noted to be less distended in his abdomen.  Dr.  Matthias Hughs saw the patient March 13, 2006 and felt that the patient had  underlying central adiposity with a probable mild colonic ileus.  The  patient was given potassium supplementation status post IV Lasix.  Observation was continued.  The patient continued to feel some discomfort in  his abdomen.  There was some concern about pulmonary embolism.  We did do a  CT of the chest to rule out pulmonary embolism, but he was noted to have an  abnormal chest CT, with a suggestion of possible asbestoses.  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  He continued to  have small bilateral pleural effusions on  radiology studies.  He was seen by  a cardiology consultant, Dr. Caprice Kluver, who was concerned about patient  having ischemia.  The patient underwent a  cardiac catheterization on Mar 16, 2006, showing patency of his grafts.  His ejection fraction was noted to be  30%.  He had left ventricular dilatation and some global dysfunction on the  ventricle.  Dr. Clarene Duke wondered if the patient had a partial small bowel  obstruction.  This seemed to be a reasonable assumption.  He got better with  conservative therapy.  Dr. Clarene Duke changed the patient on Toprol to Coreg  3.125 mg and placed him on Lasix 40 mg daily instead of Maxzide.  He also  gave him KCL 10 mEq daily.  The patient was restarted on Coumadin, but in  the meantime his INR had become subtherapeutic, and he had to receive some  Lovenox.  Pacemaker was noted to be working well.  The patient remained  afebrile.  O2 saturation improved to 96% on room air after receiving Lasix.  He had pulmonary function tests showing a decreased FVC, FEV1 and FEF 25-75  consistent with obstructive lung disease and small airways disease.  It was  decided that we would have a pulmonary consultation as an outpatient.  Wheezing and rales in the lower lung zones improved.  Cath site did well.  The patient was discharged home on Mar 17, 2006 in stable condition, and it  was anticipated the patient would be maintained on Lovenox for approximately  3 days, and the INR  would be checked at [facility].  Therefore, his  new medications are Lasix and Coreg as well as potassium supplement 10 mEq  daily.  The patient was to see Dr. Clarene Duke 2 weeks after discharge.  We will  arrange for pulmonary consultation as an outpatient.           ______________________________  Luanna Cole. Lenord Fellers, M.D.     MJB/MEDQ  D:  06/03/2006  T:  06/03/2006  Job:  [id number]   cc:   Thereasa Solo. Little, M.D.  Fax: [phone]   Bernette Redbird, M.D.  Fax: [phone]    [facility]

## 2011-04-01 NOTE — Discharge Summary (Signed)
NAMEJAMIERE, [name]               ACCOUNT NO.:  [id number]   MEDICAL RECORD NO.:  [id number]          PATIENT TYPE:  INP   LOCATION:  4713                         FACILITY:  [facility]   PHYSICIAN:  Darlin Priestly, MD  DATE OF BIRTH:  June 01, 1926   DATE OF ADMISSION:  03/23/2005  DATE OF DISCHARGE:  03/25/2005                                 DISCHARGE SUMMARY   ADMISSION DIAGNOSES:  1. End-of-life permanent transvenous pacemaker.  2. History of coronary artery disease, status post coronary artery bypass      grafting in 1994 by Dr. Tyrone Sage.  3. Ischemic cardiomyopathy, bifascicular block with frank syncope and      permanent transvenous pacemaker placement in 1999.  4. Paroxysmal atrial fibrillation on chronic Coumadin.  5. Hypertension.  6. Hyperlipidemia.  7. Hypothyroidism.     DISCHARGE DIAGNOSES:  1. End-of-life permanent transvenous pacemaker.  2. History of coronary artery disease, status post coronary artery bypass      grafting in 1994 by Dr. Tyrone Sage.  3. Ischemic cardiomyopathy, bifascicular block with frank syncope and      permanent transvenous pacemaker placement in 1999.  4. Paroxysmal atrial fibrillation on chronic Coumadin.  5. Hypertension.  6. Hyperlipidemia.  7. Hypothyroidism.     PROCEDURES:  Explantation Trilogy DR 2360 and implantation of a new DDDR St.  Jude's Model Victory-XLDR (Model No. [id number]) on Mar 24, 2005 by Dr. Pearletha Furl.  Alanda Amass, M.D.   BRIEF HISTORY:  The patient is a 77 year old white male, medical patient of  Dr. Lenise Herald, and followed by Luanna Cole. Lenord Fellers, M.D. as primary care.  The patient has the above-noted history and underwent coronary artery bypass  grafting in 1994.  Had an episode of frank syncope, and a bifascicular block  with prolonged pauses.  Underwent permanent transvenous pacemaker placement  in 1999.  He has an ischemic cardiomyopathy, a Class II congestive heart  failure, and a history of PAF on chronic  Coumadin.  He was recently seen in  the office and his pacemaker showed that nearing end of life.  He was  scheduled electively for pacemaker replacement at this time.  The patient is  chronically on Coumadin, so he was admitted a day early to make sure he was  at a safe prothrombin time and R-level prior to explantation of his old  pacemaker and implantation of a new pacemaker.   On admission, his INR was 1.6.  He was placed on heparin overnight; it was  discontinued prior to his surgery.  He was taken to the catheterization lab  and Dr. Alanda Amass did the above-noted replacement.  This included a [employer]. Jude's DDDR Model [id number] permanent transvenous pacemaker (Ser. No.  [id number]), and endocardial steroid eluding tine lead (58 cm; #[id number]).   The patient tolerated the procedure well. The following day the patient was  doing well.  He was currently in a sinus rhythm and DVD pacing.  It was  opted to let his INR come up as an outpatient, and he was discharged home on  Mar 25, 2005.   DISCHARGE MEDICATIONS:  He will go home on his preadmission medications,  which included:  1. Vasotec 10 mg b.i.d.  2. Valtrex one q.d.  3. Synthroid 50 mcg one q.d.  4. Toprol XL 25 mg q.d.  5. Lipitor 10 mg q.d.  6. Maxzide one tablet q.d.  7. Tylenol #3 or plain Tylenol 1-2 q.4h. p.r.n. pain.     DISPOSITION:  On his Coumadin, he will be given 5 mg on Friday, Saturday and  Sunday and Monday.  Starting Tuesday, he will resume his home dose (which is  5 mg Monday, Wednesday and Friday; 2.5 mg Tuesday, Thursday, Saturday and  Sunday).  We will recheck his prothrombin time on May 17 or 18, 2006.  We  will arrange for a followup appointment for a pacer wound check next week,  and then routine follow-up for pacer evaluation and office evaluation by Dr.  Jenne Campus.      WDJ/MEDQ  D:  03/25/2005  T:  03/26/2005  Job:  [id number]   cc:   Luanna Cole. Lenord Fellers, M.D.  [address], [place]  [place]  [place]  [address]  Fax: [phone]

## 2011-04-01 NOTE — Discharge Summary (Signed)
[name], [name]               ACCOUNT NO.:  [id number]   MEDICAL RECORD NO.:  [id number]          PATIENT TYPE:  INP   LOCATION:  2037                         FACILITY:  [facility]   PHYSICIAN:  Thereasa Solo. Little, M.D. DATE OF BIRTH:  [birth year]   DATE OF ADMISSION:  11/26/2007  DATE OF DISCHARGE:  11/28/2007                               DISCHARGE SUMMARY   ADDENDUM:   [name] was also discharged on aspirin 81 mg a day.      [name], P.A.    ______________________________  Thereasa Solo. Little, M.D.    Lenard Lance  D:  12/26/2007  T:  12/27/2007  Job:  [id number]

## 2011-04-01 NOTE — Cardiovascular Report (Signed)
[place]. [facility]  Patient:    [name], [name] Visit Number: [id number] MRN: [id number]          Service Type: CAT Location: [id number] [name] Dictated by:   [name], M.D. [name]   CC:         [name]. [name], M.D.  Cardiac Catheterization Laboratory  [name], M.D.   Cardiac Catheterization  INDICATIONS FOR TEST:  [name] is a 77 year old male who had had bypass surgery in 1995.  He presented with about three weeks of worsening exertional angina and is brought in for outpatient cardiac catheterization.  He is pain free.  DESCRIPTION OF PROCEDURE:  The patient is prepped and draped in the usual fashion exposing the right groin.  Following local anesthetic with 1% Xylocaine, the Seldinger technique was employed and a 6-French introducer sheath was placed into the right femoral artery.  Selective right and left coronary arteriography, graft visualization x 4, and ventriculography in the RAO projection was performed.  RESULTS:    I. HEMODYNAMIC MONITORING:          a. Central aortic pressure 156/62.          b. Left ventricular pressure 157/19.          c. There was no significant aortic valve gradient noted at the time             of pullback.   II. VENTRICULOGRAPHY:  Ventriculography in the RAO projection using 20 cc of      contrast at 12 cc/sec, revealed global hypokinesis of the left ventricle.      Ejection fraction was approximately 35-40%.  The end-diastolic pressure      was 19.  III. CORONARY ARTERIOGRAPHY:  There was calcification noted in the      distribution of the left main.           a. Left main:  Normal.          b. Left anterior descending:  The LAD had an 80% area of narrowing             just distal to the septal perforator.  There was bidirectional             flow in the mid portion of the vessel.  The first diagonal which             came off before the septal perforator was free of disease.          c. Circumflex:  The circumflex in the AV groove appeared to be             normal.  OM-1 showed mild bidirectional flow.          d. Right coronary artery:  The right coronary artery was 100%             occluded in its mid portion, however, I could not exclude some             mild bidirectional flow.   IV. VEIN GRAFTS:          a. Saphenous vein graft to the intermediate:  This graft was widely             patent and the intermediate was widely patent.  I cannot tell if             this is truly an intermediate or  a high first OM.          b. Saphenous vein graft to the PDA:  The graft itself is widely             patent.  The PDA is patent, as is the posterolateral branch.          c. Saphenous vein graft to diagonal #2:  The mid portion of the graft             was subtotaled.  There was TIMI-1 flow and questionable thrombus             material at this area.          d. Left internal mammary artery to the LAD:  The LIMA was widely             patent.  The LAD was widely patent.  Because of the high-grade complex stenosis with questionable thrombus in the saphenous vein graft to diagonal #2, arrangements were made for intervention.  INTERVENTIONAL PROCEDURE:  A 7-French left coronary bypass catheter was used, but it gave relatively poor backup.  A short luge wire was able to be placed through the area of obstruction into the distal diagonal.  A 2.5 x 15 mm CrossSail balloon was then placed and a single inflation of 11 atm for 60 seconds was performed.  Once flow was established, there was clear filling defect consistent with thrombus.  An Export catheter was then used and placed distal in the graft, right into the diagonal, and two passes with each of them showing some clot being aspirated were performed.  Following this, the vessel had a high-grade stenosis in the mid portion, but there were no real  filling defects left.  A 3.0 x 18 mm Zeta stent was then placed in the area of the initial obstruction, deployed at 16 atm for 60 seconds with a second inflation being 16 atm for 63 seconds.  The area of obstruction appeared to be normal after stenting, but there was a small filling defect just distal to the stent.  It appeared to be thrombus.  I did not feel comfortable trying to pass the Export catheter across a freshly placed stent and because of this, I took the stent balloon into this region with a single inflation of 10 atm for 60 seconds. This resolved, but it was still slightly hazy.  Again, I suspect this is thrombus.  PLAN:  The patient was on Integrilin and will be maintained on Integrilin for 24 hours.  Following the intervention, the vessel appeared to be normal. There was brisk TIMI-3 flow, with no evidence of a dissection.  I will plan to check cardiac enzymes and troponins in the morning, since the patient could have very easily had an event prior to his admission. Dictated by:   [name], M.D. [name] DD:  12/17/01 TD:  12/17/01 Job: [id number]

## 2011-04-01 NOTE — Assessment & Plan Note (Signed)
[facility]                             PULMONARY OFFICE NOTE   NAME:[name], HENSLEY TREAT                      MRN:          [id number]  DATE:11/28/2006                            DOB:          02-18-1926    PROBLEM:  1. Dyspnea.  2. Pulmonary asbestosis.  3. Coronary disease/bypass graft/Coumadin.  4. Old granulomatous.  5. Asthma with COPD.   HISTORY:  In the past week he has been more short of breath while  dancing, which he does 2 or 3 times a week.  He only notices it with  this exertion.  He denies chest pain, fevers, sputum, cold symptoms or  ankle edema.  He had called Dr. Clarene Duke who noted he was not using a  rescue inhaler and gave him a prescription for Ventolin HCFA which does  give temporary help.  I discussed this medication.  There has been no  change in the little bit of morning cough.  He had run out of  Symbicort  for about 4 days at the first of January but he has resumed it.  We  talked about how that might have contributed to his increased dyspnea  and we also discussed weather changes.  His chest CT scan at [facility] on  July 12, 2006 had shown interval resolution of some micronodulars in  the upper zones, consistent with a resolving inflammatory process as  well as stable small mediastinal nodes, thought to be reactive.  There  were stable pleural plaques consistent with previous asbestos exposure  and stable T7 vertebral compression.   MEDICATION:  1. Coreg 3.125 mg b.i.d.  2. Synthroid 50 mcg.  3. Lipitor 10 mg.  4. Vasotec 10 mg x1/2.  5. COQ10.  6. Coumadin.  7. Lumigan eye drops.  8. Cosopt eye drops.  9. Symbicort 160/4.5, 2 puffs b.i.d.  10.Furosemide 80 mg.  11.Potassium.  12.Amiodarone  200 mg.  13.Bentyl and HFA.   No medication allergy.   OBJECTIVE:  Weight 176 pounds which is stable.  Blood pressure 98/58,  pulse 70, room air saturation 99%.  He seems unlabored.  I do not hear rales or crackles.  He is not  coughing.  There is no neck vein distention or edema and no adenopathy.  Heart sounds are regular without murmur.   IMPRESSION:  1. 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  2. Asthma with chronic obstructive pulmonary disease, exacerbation      with dyspnea mainly associated with exercise. A cardiac component      is not yet excluded.  He does add that he is waking at 2 or 3 in      the morning, tight in the chest with some relief from his Ventolin      inhaler which suggests this is bronchospasm.   PLAN:  1. Chest x-ray.  2. Nebulizer treatment here.  Xopenex 0.63 with Depo-Medrol 80 mg IM      and steroid talk.  3. He is going to keep his appointment in February, earlier p.r.n.     Clinton D. Maple Hudson, MD, [facility], FACP  Electronically  Signed    CDY/MedQ  DD: 11/28/2006  DT: 11/29/2006  Job #: [id number]   cc:   Thereasa Solo. Little, M.D.  Luanna Cole. Lenord Fellers, M.D.

## 2011-04-01 NOTE — Assessment & Plan Note (Signed)
[facility]                             PULMONARY OFFICE NOTE   NAME:[name], [name]                      MRN:          [id number]  DATE:01/08/2007                            DOB:          [birth year]    PROBLEMS:  1. Dyspnea.  2. Pulmonary asbestosis.  3. Coronary disease/bypass graft/Coumadin.  4. Old granulomatous disease.  5. Asthma with COPD.  6. Atrial fibrillation/Coumadin/pacemaker.   HISTORY:  He says he is doing fine and thinks Symbicort has been a big  help.  He has not needed albuterol in quite a while.  Minimal morning  cough, mostly just as he first gets out of bed, which he does not seem  to mind.  There has been no chest pain, blood, adenopathy or  palpitation.  Has had cardioversion.   MEDICATIONS:  1. Coreg 3.125 mg.  2. Synthroid 50 mcg.  3. Lipitor 10 mg.  4. Vasotec one-half x10 mg.  5. Coumadin.  6. Lumigan eye drops.  7. Cosopt eye drops.  8. Symbicort 160/4.5 two puffs b.i.d.  9. Furosemide 80 mg.  10.Potassium.  11.Amiodarone 200 mg daily.  12.Ventolin rescue inhaler.   OBJECTIVE:  Weight 172 pounds, BP 108/68, pulse regular to my palpation  at 70, room air saturation 97%.  I do not hear a murmur.  Lungs sound clear, I do not hear crackles, pleural rub or dullness.  I find no adenopathy, no peripheral edema and no clubbing.   CHEST X-RAY:  Film of November 28, 2006, showed cardiomegaly, COPD and  pacemaker with degenerative changes in the thoracic spine and a  vertebral compression fracture, which appears old.  No obvious  interstitial disease or plaques.  Pulmonary function testing last summer  had shown normal total lung capacity, no evidence of restriction.  There  was mild obstruction with minimal response to bronchodilator and a  positive methacholine inhalation challenge test.   IMPRESSION:  Overall stable asthma with chronic obstructive pulmonary  disease.  Little radiologic evidence to suggest  asbestos-related  disease.  Chronic atrial fibrillation and coronary disease managed as  above.   PLAN:  No changes to offer.  I have encouraged him to walk for  endurance.  Schedule return in 6 months, earlier p.r.n.     Clinton D. Maple Hudson, MD, [facility], FACP  Electronically Signed    CDY/MedQ  DD: 01/10/2007  DT: 01/10/2007  Job #: [id number]   cc:   Luanna Cole. Lenord Fellers, M.D.  Thereasa Solo. Little, M.D.

## 2011-04-01 NOTE — Op Note (Signed)
NAME:  [name], [name]               ACCOUNT NO.:  [id number]   MEDICAL RECORD NO.:  [id number]          PATIENT TYPE:  INP   LOCATION:  4713                         FACILITY:  [facility]   PHYSICIAN:  Richard A. Alanda Amass, M.D.DATE OF BIRTH:  [birth year]   DATE OF PROCEDURE:  03/24/2005  DATE OF DISCHARGE:                                 OPERATIVE REPORT   PROCEDURE:  Pacemaker revision with end of life generator change and  implantation of new bipolar IS1 steroid eluting in line coaxial Pacesetter  number 1646T-58 cm, SN [id number] passive fixation ventricular electrode, left  upper extremity venogram, explant of end of life premature expired Trilogy  DR Pacesetter number [id number], SN [id number] (DOI 10/20/98).   COMPLICATIONS:  None.   ESTIMATED BLOOD LOSS:  Approximately 40 mL.   ANESTHESIA:  5 mg Valium p.o. premedication, Versed 4 mg IV in divided  doses, Fentanyl 37.5 mcg in divided doses IV for sedation.   PREOPERATIVE ANTIBIOTIC PROPHYLAXIS:  Ancef 1 gram IV (to be continued x 3  postoperative).   Pulse generator [employer] number [id number], [place] [id number] DDDR A-V  universal multi-programmable, auto-threshold, rate responsive.  Capped  previous atrial and ventricular electrode DOI 10/20/98, Pacesetter 13246T-58  cm, SN [id number], retained old atrial electrode DOI 10/20/98, Pacesetter  1388TC/52 cm, SN [id number], active fixation screw in bipolar IS1.   PREOPERATIVE DIAGNOSIS:  1. Bifascicular block with prolonged HV interval and frank syncope in      1999.  2. PTVP October 20, 1998, DDDR as outlined above.  3. Premature end of life due to chronic high ventricular thresholds.  4. Hypothyroidism on supplement.  5. Chronic ischemic cardiomyopathy well compensated class II status post      coronary artery bypass grafting in 1994, Dr. Tyrone Sage.  6. Recatheterization December 17, 2001, with left internal mammary artery      to left anterior descending  patent, saphenous vein graft to posterior     descending artery patent, saphenous vein graft to OD patent, subtotal      saphenous vein graft to second diagonal, ejection fraction 35-40%.  7. Hyperlipidemia.  8. Paroxysmal atrial fibrillation on chronic Coumadin therapy not      requiring anti-arrhythmic therapy on chronic beta blockers.     POSTOPERATIVE DIAGNOSIS:  1. Bifascicular block with prolonged HV interval and frank syncope in      1999.  2. PTVP October 20, 1998, DDDR as outlined above.  3. Premature end of life due to chronic high ventricular thresholds.  4. Hypothyroidism on supplement.  5. Chronic ischemic cardiomyopathy well compensated class II status post      coronary artery bypass grafting in 1994, Dr. Tyrone Sage.  6. Recatheterization December 17, 2001, with left internal mammary artery      to left anterior descending  patent, saphenous vein graft to posterior      descending artery patent, saphenous vein graft to OD patent, subtotal      saphenous vein graft to second diagonal, ejection fraction 35-40%.  7. Hyperlipidemia.  8. Paroxysmal atrial fibrillation on chronic Coumadin therapy not  requiring anti-arrhythmic therapy on chronic beta blockers.    Right atrial thresholds:  Unipolar V 4.8 MV, threshold 0.2 V at 0.5 milliseconds, resistance 327 ohms.  Bipolar atrial P 3 MV, threshold 0.7 V at 0.5 milliseconds, resistance 387  ohms.   New ventricular electrode:  Unipolar R 14 MV, resistance 503 ohms, threshold 0.1 V at 0.5 milliseconds.  Bipolar R 9.1 MV, resistance 809 ohms, threshold 0.4 V at 0.5 milliseconds.   There was antegrade AV node Wenckebach at an atrial pacing rate of 70-80 per  minute.   Explanted lead that was capped (DOI 10/20/98) unipolar threshold 2.8 volts at  0.5 milliseconds, resistance 490 ohms, R wave 6.9 MV, bipolar old  ventricular electrode R 5.8 MV, threshold 2 volts at 0.5 milliseconds,  resistance 575 ohms.   DESCRIPTION OF PROCEDURE:  The patient was brought to the  second floor EP  lab in a post absorptive state after 5 mg Valium p.o. premedication.  Informed consent was obtained to proceed with premature end of life  generator replacement along with consideration for new RV lead as  recommended to the patient following pacemaker expert Dr. Lenise Herald.  The patient had prior pacemaker implanted as outlined above in 1999 after a  syncopal episode when falling from a deer stand.  He had bifascicular block  with prolonged HV prompting pacemaker insertion, he has had no recurrent  syncope.  He has been stable clinically with cardiac history as outlined but  has had chronic high ventricular thresholds.  The most recent outpatient  threshold at 1.2 millisecond pulse width was 2 volts (Dr. Jenne Campus March 09, 2005).  It was felt that the patient would probably be best suited with a  new ventricular electrode to provide longer generator life and, hopefully,  avoid further operative procedures.   The left anterior chest was prepped and draped in the usual manner.  1%  Xylocaine was used for local anesthesia.  The previous pacemaker was  implanted by Dr. Armanda Magic.  It was positioned close to the left shoulder  laterally.  An incision was performed over the previously placed slightly  angulated incision.  The incision was brought down to the anterior fibrous  capsule using blunt dissection and electrocautery to control hemostasis.  1%  Xylocaine and conscious sedation were used during the procedure.  The  fibrous capsule was identified and incised and opened.  The generator was  delivered from the pocket.  The patient was overriding the pacer, was not  pacer dependent, with it programmed down with a long AV delay.  The  electrodes were disconnected from the pulse generator unscrewing the single  hex nut for each electrode.  By inspection, both pin connectors looked good and the proximal electrodes appeared normal with no blood in the leads or  defects  visible.  Fluoroscopically, there were no electrode defects or  insulation defects or conductor coil defects visualized.  The ventricular  electrode had continued significantly high threshold and it was felt best to  attempt to put in a new ventricular electrode in this setting.  Initial  attempt at subclavian puncture was not successful using fluoroscopic control  in the previously placed leads.  A left upper extremity venogram was then  done x 2 with 20 mL of dye.  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  There  were no significant collaterals demonstrated.  There was some vague  turbulent flow seen in the  proximal portion of the lead slightly superior.  Preoperative venous Dopplers did not show subclavian obstruction.  We then  made a second puncture with an 18 thin wall needle more medially and I was  able to get free flow back and I advanced the J-tip stainless steel guide-  wire and a 7 French peel away [place] introducer.  Because of some hang up at  the SVC RA junction, a long guide-wire was inserted and the sheath was  placed with the 7 [place] long safe sheath.  The ventricular electrode was  then inserted, the sheath was peeled away, and the ventricular electrode was  positioned in the RV apex inferior to the prior electrode in good position.  There were excellent acute unipolar bipolar thresholds and no diaphragmatic  stimulation at 10 volt output with PSA testing.  The previously placed  ventricular lead was then capped with a silicone cap and secured with two #1  silk suture ties.  The new electrode was secured at the insertion site with  a #1 figure-of-eight silk suture around the sewing collar to help control  hemostasis and prevent migration.  It was further secured with two  interrupted #1 silk sutures around the sewing collar.  The pocket was  irrigated with 500 mg kanamycin solution and was electrocautery was  used to  control hemostasis and oozing.  The patient had been on Coumadin  preoperatively which was held the night before.  The generator was hooked to  the new ventricular and old atrial electrodes with the single hex nut  tightened for each electrode.  The capped electrode was positioned in the  inferior portion of the pocket behind the generator and the generator was  delivered in the pocket with the electrodes still behind it and loosely  secured with #1 silk suture to prevent migration.  Sponge count was correct.  Hemostasis was controlled.  The subcutaneous tissue was closed with two  separate running layers of 2-0 Vicryl suture and the skin was closed with 5-  0 subcuticular Vicryl suture.  Steri-Strips were applied.  Fluoroscopy  showed good position of the electrodes and pulse generator.  There was no  pneumothorax.  The patient tolerated the procedure well and was transferred to the holding area for postoperative programming in stable condition.      RAW/MEDQ  D:  03/24/2005  T:  03/24/2005  Job:  [id number]   cc:   Darlin Priestly, MD  [address]  [place]  [place] [address]  Fax: [phone]   Thereasa Solo. Little, M.D.  [address]  [address]  [place]  [place] [address]  Fax: [phone]   Luanna Cole. Lenord Fellers, M.D.  [address] [place]  [place]  [place] [address]  Fax: [phone]

## 2011-04-01 NOTE — Discharge Summary (Signed)
NAMELUVERN, MCISAAC               ACCOUNT NO.:  [id number]   MEDICAL RECORD NO.:  [id number]          PATIENT TYPE:  INP   LOCATION:  2037                         FACILITY:  [facility]   PHYSICIAN:  Thereasa Solo. Little, M.D. DATE OF BIRTH:  [birth year]   DATE OF ADMISSION:  11/26/2007  DATE OF DISCHARGE:  11/28/2007                               DISCHARGE SUMMARY   ADDENDUM:  This is an Addendum to a Discharge Summary that was  originally dictated November 28, 2007, on [facility] [address].  This is Corine Shelter, [place], dictating for Dr. Clarene Duke.   Addendum to the Discharge Summary:  Mr. Siler was sent home on  aspirin 81 mg a day.  He is not on an ACE inhibitor because of  hypotension.      [name], P.A.    ______________________________  Thereasa Solo. Little, M.D.    Lenard Lance  D:  12/17/2007  T:  12/17/2007  Job:  [id number]

## 2011-04-01 NOTE — Assessment & Plan Note (Signed)
[facility]                               PULMONARY OFFICE NOTE   NAME:[name]                        MRN:          [id number]  DATE:07/10/2006                            DOB:          04-Dec-1925    PROBLEMS:  1. Dyspnea.  2. Pulmonary asbestosis.  3. Coronary disease/bypass graft/Coumadin.  4. Old granulomas.  5. Asthma with COPD.   HISTORY:  Hyperactive airways were demonstrated on his methacholine  inhalation challenge test with mild obstructive airways disease responding  to bronchodilator as described at the June visit.  Since then, he has  started Symbicort 160/4.5 and he says that he likes this a lot.  He is  noticing distinctly less dyspnea during his routine activities.  He has had  no mouth irritation, palpitations, or problems from it.  Today, we reviewed  his chest CT from May which had shown evidence of prior asbestos exposure  with pleural thickening and calcification, a suggestion of mild pulmonary  vascular congestion, some clustered micronodules, possibly from an old  atypical infection, mild mediastinal adenopathy, and a T7 vertebral  compression fracture with minimal impression upon the canal.  He agrees to a  follow-up film looking for progression.   MEDICATIONS:  1. Maxzide.  2. Coreg 3.125 mg b.i.d.  3. Synthroid 50 mcg.  4. Lipitor 10 mg.  5. Vasotec.  6. Coumadin.  7. Lumigan eye drops.  8. Symbicort 160/4.5 two puffs b.i.d.   No medication allergy.   PHYSICAL EXAMINATION:  Weight:  176 pounds.  BP:  98/58.  Pulse feels  regular to me at 70 per minute.  Room air saturation 98%.  __________  with  no adenopathy.  I do not hear crackles, rales, or wheeze on chest exam.  Work of breathing is not increased.  Heart sounds are without murmur or  gallop.  There is no clubbing, no edema, no adenopathy.   IMPRESSION:  1. 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  2. Asthma with COPD.  3. Lung nodules for followup.   PLAN:  1. He will continue Symbicort 160/4.5 at two puffs b.i.d.  2. We are scheduling a follow-up CT scan of the chest to be compared with      his May study.  3. I again discussed warning symptoms that might mark him as at increased      risk for developing asbestos-related disease, especially change in      cough, chest pain, and respiratory or GI tract blood.  4. Schedule return in six months, earlier p.r.n.                                   Dylan D. Maple Hudson, MD, FCCP, FACP   CDY/MedQ  DD:  07/12/2006  DT:  07/13/2006  Job #:  [id number]   cc:   Dylan Cole. Lenord Fellers, MD  Dylan Solo Little, MD

## 2011-04-01 NOTE — Op Note (Signed)
NAME:  [name], [name]               ACCOUNT NO.:  [id number]   MEDICAL RECORD NO.:  [id number]          PATIENT TYPE:  AMB   LOCATION:  ENDO                         FACILITY:  [facility]   PHYSICIAN:  Petra Kuba, M.D.    DATE OF BIRTH:  March 01, 1926   DATE OF PROCEDURE:  01/16/2007  DATE OF DISCHARGE:                               OPERATIVE REPORT   PROCEDURE:  Colonoscopy with polypectomy.   INDICATION:  Patient with history of colon polyps, one quite worrisome,  not sure if completely removed.  Consent was signed after risks,  benefits, methods, and options were thoroughly discussed multiple times  in the past.   MEDICINES USED:  Fentanyl 70 mcg, Versed 7 mg.   PROCEDURE:  Rectal inspections pertinent for external hemorrhoids.  Digital exam was negative.  The video colonoscope was inserted and  easily advanced from the colon to the cecum.  This did require some  abdominal pressure, but no position changes.  No significant mass lesion  was seen on insertion.  Prep was fairly adequate.  It did require a  moderate amount of washing and suctioning for adequate visualization.  There were diverticula throughout the colon.  The cecum was identified  by the appendiceal orifice and the ileocecal valve.  The cecum and  ascending polyps were essentially unchanged from previous seen.  Four of  them we elected to go ahead and snare.  Electrocautery was applied at a  lower setting of 15/15 and they were all suctioned through the scope and  collected in the trap.  There were 2 medium-sized sessile polyps that  were deemed difficult to remove and based on the other polyps in the  transverse and splenic flexure from previous colonoscopy, we elected to  withdraw.  Two were left, 1 in the cecum and 1 in the proximal  ascending.  Neither had worrisome stigmata, but they were medium-sized  sessile polyps.  On slow withdrawal back to the mid transverse, a small  proximal transverse polyp was seen and  again we did not elect to remove  that.  The medium-size sessile polyp that had been partially removed  last time was found.  Two snares were done and 3 hot biopsies and it was  put in a separate container.  The scope was further withdrawn.  We did  not find any evidence of the splenic flexure worrisome polyp that we had  seen before.  The scope was withdrawn back to the rectum.  Anorectal  pull through and retroflexion confirmed the hemorrhoids, internal and  external. The scope was then reinserted and advanced to the mid  transverse polypectomy site.  Again on insertion the splenic flexure  polyp was not seen and we withdrew one more time, again without signs of  this previously worrisome polyp.  Once back in the rectum one more time,  the scope is reinserted a short ways.  Air was suctioned.  The scope was  removed.  The patient tolerated the procedure well.  There was no  obvious immediate complication endoscopic.   DIAGNOSES:  1. Internal and external hemorrhoids.  2. Diverticula throughout.  3. Tiny sigmoid polyp not mentioned above and a small transverse      proximal small polyp seen but not biopsied.  4. Splenic flexure previously worrisome polyp not found in exam      crossing this area x2.  5. Medium sessile mid transverse polyp snared x2, hot biopsied x3.  6. Four cecum and ascending small and medium-sized sessile polyps were      snared x4 and put in a separate container.  7. Two cecum and proximal ascending medium-sized sessile polyps not      biopsied, not worrisome.  8. Otherwise within normal limits in the cecum.   PLAN:  Slowly readvance Coumadin.  Await pathology.  Probably recheck  colon screening in 6-12 months if doing well to re-evaluate the splenic  flexure, re-evaluate these other polyps, and try to remove the residual  ones if he does well with these polypectomies at this time.  Happy to  see back sooner p.r.n.           ______________________________   Petra Kuba, M.D.     MEM/MEDQ  D:  01/16/2007  T:  01/16/2007  Job:  [id number]   cc:   Luanna Cole. Lenord Fellers, M.D.

## 2011-04-01 NOTE — Op Note (Signed)
NAME:  [name], [name]               ACCOUNT NO.:  [id number]   MEDICAL RECORD NO.:  [id number]          PATIENT TYPE:  INP   LOCATION:  6532                         FACILITY:  [facility]   PHYSICIAN:  Thereasa Solo. Little, M.D. DATE OF BIRTH:  1926/02/16   DATE OF PROCEDURE:  03/16/2006  DATE OF DISCHARGE:                                 OPERATIVE REPORT   CARDIAC CATH REPORT.   INDICATIONS FOR TEST:  This 77 year old male was admitted with abdominal  distention and significant shortness of breath.  His BNP was elevated at  greater than 600.  His echocardiogram showed an ejection fraction around  35%, which is chronic, but it showed significant (moderate to severe) mitral  regurgitation.  He had had bypass surgery in 1999 and I had placed a stent  in the saphenous vein graft to his diagonal in the distant past.  He is  brought to the Cath Lab for right and left heart catheterization and graft  visualization.   After obtaining informed consent, the patient was prepped and draped in the  usual sterile fashion exposing the right groin.  Following local anesthetic  with 1% Xylocaine, the Seldinger technique was employed and a 6 [place]  introducer sheath was placed in the right femoral artery, a 7 French  introducer sheath in the right femoral vein.  The Swan-Ganz catheter was  then advanced to its normal root in the pulmonary artery with hemodynamic  monitoring undertaken throughout each station.  Cardiac output by thermal  dilution was then performed.  Ventriculography in the RAO projection, left  and right coronary arteriography and graft visualization was also performed.   COMPLICATIONS:  None.   TOTAL CONTRAST:  140 mL.   EQUIPMENT:  6 French diagnostic Judkins configuration catheters and a JL4  catheter was used to visualize all grafts.  A 7 French Swan-Ganz catheter  was used.   RESULTS:  Hemodynamic monitoring.  Right atrial pressure was 8, right  ventricular pressure 33/9,  pulmonary  artery pressure 29/15, weight was 15.   Central aortic pressure 119/64, left ventricular pressure 120/9 with an end-  diastolic pressure of 15.   VENTRICULOGRAPHY:  Ventriculography in the RAO projection using 25 mL of  contrast at 50 mL/sec, revealed the left ventricle to be markedly dilated  and with severe global LV dysfunction, ejection fraction around 25-30%, +1  mitral regurgitation was seen.   Cardiac output by thermodilution 5.9, by FICK 3.7, cardiac index  thermodilution 1.8, FICK 2.8.   CORONARY ARTERIOGRAPHY:  1.  Left main normal.  2.  Circumflex 100% occluded proximally.  3.  LAD.  There was bidirectional flow in the mid LAD.  The proximal LAD      after the first diagonal had an area of 80% narrowing.  The first      diagonal was free of disease.  The second diagonal was not visualized.  4.  Intermediate (optional diagonal).  This was a small vessel with proximal      60-70% area of narrowing and it was grafted.  5.  Right right coronary artery 100% occluded in  its mid portion.   GRAFTS:  1.  Saphenous vein graft to the optional diagonal of the graft was patent,      the optional diagonal was small but patent.  2.  Saphenous vein graft to diagonal-2.  There was stent in the mid portion      of this graft which was patent.  There was slow flow in the distal      portion of the vessel and the diagonal itself was very small. There was      no evidence of any thrombus within this graft.  3.  Saphenous vein graft to the RCA.  The graft was widely patent.  The PDA      and the posterolateral branches were all visualized and free of disease.  4.  Internal mammary artery to the LAD.  The internal mammary artery is      widely patent and inserted in the mid portion of the LAD and the distal      LAD showed good flow.   CONCLUSION:  1.  All grafts patent including the saphenous vein graft to the diagonal      which has a stent in its mid portion.  2.  Severe LV  dysfunction with an ejection fraction 25-30%.  3.  Mitral regurgitation +1.  4.  Slight elevation of his pulmonary artery pressure at 29/15.   The patient's INR was 1.8 on Mar 15, 2006.  After he was placed on the table  his INR came back at 1.9, but he was on heparin.  The heparin was, of  course, discontinued and an INR has been sent to the lab STAT.  If the INR  is still greater than 1.7, I plan to give the patient 1 unit of fresh frozen  plasma before the sheaths are removed.  Otherwise, the sheaths will be  removed, he will be started back on heparin later today and Coumadin with  plans for Lovenox as an outpatient as a bridge until his INR is 2.0.   It appears that his symptoms of increasing dyspnea are related to LV  dysfunction.  I have increased his Vasotec to 10 mg b.i.d., changed his  Toprol to Coreg 3.125 mg b.i.d., added Lasix 40 mg a day and potassium 10 mg  a day.   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  His CT of chest clearly shows asbestosis.  Pulmonary  function studies were performed and showed rather severe COPD with an FEV1  of around 47%.  He will need referral to Pulmonary for potential management  of COPD related issues.           ______________________________  Thereasa Solo Little, M.D.     ABL/MEDQ  D:  03/16/2006  T:  03/16/2006  Job:  [id number]   cc:   Luanna Cole. Lenord Fellers, M.D.  Fax: [phone]   Cath Lab

## 2011-04-01 NOTE — H&P (Signed)
NAME:  [name], [name]               ACCOUNT NO.:  [id number]   MEDICAL RECORD NO.:  [id number]           PATIENT TYPE:   LOCATION:                                 FACILITY:   PHYSICIAN:  Luanna Cole. Lenord Fellers, M.D.        DATE OF BIRTH:   DATE OF ADMISSION:  03/12/2006  DATE OF DISCHARGE:                                HISTORY & PHYSICAL   CHIEF COMPLAINT:  Abdominal distention.   HISTORY OF PRESENT ILLNESS:  This 77 year old white male well known to me  with history of hypertension, coronary artery disease, status post coronary  artery bypass graft x4 in 1995, syncope secondary to AV nodal disease with  DDD pacer insertion on November 01, 1998 and pacer change-out in 2006,  called at 7:00 a.m. today complaining of a two day history of abdominal  distention.  The last cardiac cath was in 2003 and at that time, he had an  ejection fraction of 35-45%.  He had thrombus in his saphenous vein graft,  which was stented with a non-drug eluting stent.  The patient is on chronic  Coumadin therapy with an INR of 2.5.  The patient first noted abdominal  distention while doing his routine daily walking on March 10, 2006.  He had  not been walking very regularly for three weeks previously because of a  right knee injury.  He noticed his abdomen was distended.  He felt pressure  and some shortness of breath but had no chest pain.  He had normal bowel  movement on April 27th.  Denies nausea and vomiting.  He has noted decreased  appetite, particularly yesterday.  No melena.  He had a small bowel movement  this morning.  In the emergency department, he has marked abdominal  distention, But bowel sounds are present.  There is no significant  tenderness to palpation.  Abdomen is tympanic.  He has some end-expiratory  wheezing bilaterally with decreased breath sounds bilaterally with crackles  in the lung bases.  His sodium is 127, presumably volume depletion with poor  p.o. intake over the past 24 hours or  so.  His white blood cell count is  normal.  His hemoglobin is 12.2 gm.  He has refused colonoscopy previously  in the past.  His hemoglobin in July, 2006 was 13 gm with normal MCV.  Hemoglobin July 2005 was 13.9 with a normal MCV.   PAST MEDICAL HISTORY:  No known drug allergies.  He has a history of right  carotid bruit on Doppler study in 2001 and CVTS showing some mild plaque  with no significant ICA stenosis.  History of allergic rhinitis with  positive skin test to ragweed, tree pollen, and mold.  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  History of paroxysmal atrial fibrillation.  He is on chronic Coumadin  therapy.  He had a Cardiolite study in June, 2006.  History of basal cell  carcinoma of the left chest, left and right back in 2002.  History of  hypothyroidism.  History of  hypertension.  He had a pacemaker insertion  secondary to syncope in November, 1999 after having fallen from a deer  hunting tree stand and suffering a fracture of T7 and C7.  He had an  inferior MI in 1992.  He has had angioplasty to the right coronary artery in  April 1992, July, 1992, September, 1992.  He had PTCA of the circumflex  artery in November, 1993.  Coronary artery bypass graft x4 in December,  1995.  Right inguinal hernia repair in February, 1997.  Stent to saphenous  vein graft in 2003.   MEDICATIONS:  1.  Vasotec 10 mg daily.  2.  Norvasc 10 mg daily.  3.  Coumadin 5 mg per day except for 2.5 mg on Tuesdays and Saturdays.  4.  Maxzide 25 daily.  5.  Synthroid 0.05 mg daily.  6.  Lipitor 10 mg daily.  7.  Toprol XL 25 mg daily.  8.  Viagra p.r.n. or Cialis.   Tetanus immunization in January, 1997.  Pneumovax immunization in June,  2004.   SOCIAL HISTORY:  He is a retired Advertising account planner.  He  is a widower and has five children, all adult, including one set of twin  girls.  Social alcohol consumption.   Nonsmoker.   FAMILY HISTORY:  Only child.  Father died at age 41 of acute MI.  Mother  died at age 80 with complications of arteriosclerotic cardiovascular  disease.   PHYSICAL EXAMINATION:  VITAL SIGNS:  Per emergency department.  SKIN:  Warm and dry.  NODES:  None.  HEENT:  Head is normocephalic and atraumatic.  Sclerae and conjunctivae are  clear.  TM's and pharynx are clear.  NECK:  Supple.  He has a right carotid bruit.  No thyromegaly.  CHEST:  Decreased breath sounds in the bases and noted end-expiratory  wheezes bilaterally, which were faint.  Crackles, both bases.  CARDIAC:  Irregularly irregular rhythm.  Normal S1 and S2.  ABDOMEN:  Distended, tympanic.  Bowel sounds are present.  No significant  tenderness.  Cannot address organomegaly.  RECTAL:  Soft stool.  Brown in color.  Guaiac negative in the emergency  department lab.  Prostate slightly enlarged.  No nodules.  EXTREMITIES:  Without pitting edema.  NEURO:  No gross focal deficits on brief neurological exam.   An abdominal CT is pending.   IMPRESSION:  1.  Small bowel obstruction versus acute diverticulitis.  2.  History of coronary artery disease, status post coronary artery bypass      graft and multiple angioplasties.  3.  History of paroxysmal atrial fibrillation on chronic Coumadin therapy.  4.  History of hypothyroidism.  5.  Mild hyponatremia, presumably from volume depletion, poor p.o. intake.  6.  History of pacemaker insertion secondary to AV nodal disease.  7.  History of fracture of T7 and C7 secondary to fall from deer stand in      1999.  8.  History of basal cell carcinoma __________  skin.   PLAN:  Patient will be admitted and placed to telemetry.  GI and cardiology  consults will be obtained.  Currently, he is on IV and normal saline at 50  cc/hr.           ______________________________  Luanna Cole. Lenord Fellers, M.D.    MJB/MEDQ  D:  03/12/2006  T:  03/12/2006  Job:  [id number]   cc:   Graylin Shiver, M.D.  Fax: [phone]   Cristy Hilts.  Jacinto Halim, MD  Fax: [phone]

## 2011-04-01 NOTE — Cardiovascular Report (Signed)
NAME:  [name], [name]               ACCOUNT NO.:  [id number]   MEDICAL RECORD NO.:  [id number]          PATIENT TYPE:  OIB   LOCATION:  2852                         FACILITY:  [facility]   PHYSICIAN:  Thereasa Solo. Little, M.D. DATE OF BIRTH:  June 15, 1926   DATE OF PROCEDURE:  DATE OF DISCHARGE:                            CARDIAC CATHETERIZATION   This 77 year old male has a permanent pacemaker bradycardia.  He has an  ejection fraction of 30% and has had recurrent problems with congestive  heart failure and has recently been diagnosed as having asthma.   Approximately 12 weeks ago, he was noted to be in atrial flutter which  was the onset of another exacerbation of his congestive heart failure.  He was placed on amiodarone and after having an appropriate INR for  greater than 2 months he is brought in for outpatient elective  cardioversion.   He has a Engineer, water. Jude pacer in place and __________ from [place]. Jude was  present.   Interrogation of his pacemaker shows underlying atrial flutter with a  rate of 300.   After obtaining an appropriate level of anesthesia using 200 mg of IV  Pentothal, the patient underwent elective cardioversion.  120 watt  seconds were used and he converted to an atrial rate of 70.   His pacemaker was programmed to a DDDR mode.   He should be ready for discharge later today and follow up in my office  tomorrow.           ______________________________  Thereasa Solo Little, M.D.     ABL/MEDQ  D:  12/07/2006  T:  12/07/2006  Job:  [id number]   cc:   Luanna Cole. Lenord Fellers, M.D.  Cath Lab

## 2011-04-01 NOTE — Consult Note (Signed)
NAME:  [name], [name] NO.:  [id number]   MEDICAL RECORD NO.:  [id number]          PATIENT TYPE:  INP   LOCATION:  6731                         FACILITY:  [facility]   PHYSICIAN:  Graylin Shiver, M.D.   DATE OF BIRTH:  [birth year]   DATE OF CONSULTATION:  03/12/2006  DATE OF DISCHARGE:                                   CONSULTATION   REASON FOR CONSULTATION:  The patient is a 77 year old male who for the past  three days has been experiencing what he describes as abdominal bloating and  distension.  He presented to the emergency room with this complaint.  He was  also short of breath.  Dr. Lenord Fellers saw the patient in the emergency room and  felt that his abdomen was abnormally distended.  A CT scan of the abdomen  was ordered.  Nothing acute was seen.  There was no evidence of bowel  obstruction, ileus, inflammatory condition, or ascites.  There was a lot of  intraabdominal fat.  I reviewed the CT scan with Dr. Jolaine Click from  radiology.  The patient denies vomiting.  He has been having some belching.  He has been having bowel movements and passing flatus.   PAST HISTORY:  Coronary artery disease status post coronary artery bypass  graft in the past, ischemic cardiomyopathy, atrial fibrillation,  hypertension, hyperlipidemia, hypothyroidism.  The patient has a pacemaker.   MEDICATIONS:  Vasotec, Norvasc, Coumadin, Maxzide, Synthroid, Lipitor,  Toprol XL, Viagra.   SOCIAL HISTORY:  He does not smoke but drinks occasional alcohol.   REVIEW OF SYSTEMS:  No complaints of chest pain.  He has been experiencing  some shortness of breath.   PHYSICAL EXAMINATION:  VITAL SIGNS:  Vital signs are stable.  GENERAL:  He does not appear in any acute distress.  HEENT:  He is nonicteric.  HEART:  Irregular rhythm.  LUNGS:  Crackles at bases, also some wheezing heard.  ABDOMEN:  Abdomen does appear distended, slightly tympanitic, not tender to  deep palpation, no obvious  hepatosplenomegaly.   IMPRESSION:  Abdominal distension of unclear etiology.  There is nothing  obvious on the CT scan to explain this.  There is a lot of intraperitoneal  fat, and in reviewing the CT scan with Dr. Bonnielee Haff, he feels that the enlarged  abdomen is secondary to adipose tissue.   PLAN:  Observe the patient.  Repeat x-ray of the abdomen with flat and  upright tomorrow.           ______________________________  Graylin Shiver, M.D.     SFG/MEDQ  D:  03/12/2006  T:  03/13/2006  Job:  [id number]   cc:   Luanna Cole. Lenord Fellers, M.D.  Fax: [phone]

## 2011-04-11 ENCOUNTER — Other Ambulatory Visit: Payer: Self-pay | Admitting: Internal Medicine

## 2011-04-21 ENCOUNTER — Telehealth: Payer: Self-pay | Admitting: Internal Medicine

## 2011-04-21 MED ORDER — BUDESONIDE-FORMOTEROL FUMARATE 160-4.5 MCG/ACT IN AERO: 2.0000 | INHALATION_SPRAY | Freq: Two times a day (BID) | RESPIRATORY_TRACT | Status: DC

## 2011-04-21 NOTE — Telephone Encounter (Signed)
Patient last seen by CDY 3.21.11 and was told to follow up in 1 year.  No pending appts.  appt needs to be scheduled before we can send refills.  LMOM TCB x1.

## 2011-04-21 NOTE — Telephone Encounter (Signed)
Pt returned call.  He was ok to schedule f/u appt.  Scheduled for CDY's first availabe - July 10 at 2:45 -- pt aware.  3 month symbicort rx sent to [facility] and pt was informed he must keep this appt for additional rxs.  He verbalized understanding of this and voiced no further questions at this time.

## 2011-05-23 ENCOUNTER — Encounter: Payer: Self-pay | Admitting: Internal Medicine

## 2011-05-24 ENCOUNTER — Ambulatory Visit (INDEPENDENT_AMBULATORY_CARE_PROVIDER_SITE_OTHER): Payer: Medicare Other | Admitting: Internal Medicine

## 2011-05-24 ENCOUNTER — Encounter: Payer: Self-pay | Admitting: Internal Medicine

## 2011-05-24 ENCOUNTER — Ambulatory Visit (INDEPENDENT_AMBULATORY_CARE_PROVIDER_SITE_OTHER)
Admission: RE | Admit: 2011-05-24 | Discharge: 2011-05-24 | Disposition: A | Discharge status: Home / Self Care | Payer: Medicare Other | Source: Ambulatory Visit | Attending: Internal Medicine | Admitting: Internal Medicine

## 2011-05-24 VITALS — BP 114/70 | HR 79 | Ht 71.0 in | Wt 176.0 lb

## 2011-05-24 DIAGNOSIS — J61 Pneumoconiosis due to asbestos and other mineral fibers: Secondary | ICD-10-CM

## 2011-05-24 DIAGNOSIS — J45909 Unspecified asthma, uncomplicated: Secondary | ICD-10-CM

## 2011-05-24 NOTE — Progress Notes (Signed)
  Subjective:    Patient ID: [name], male    DOB: [birth year], 77 y.o.   MRN: [id number]  HPI 05/24/11- COPD, Asbestosis, chronic granulomatous disease, hx AFib Last here - February 01, 2010 after pacemaker He has had a good year without major flare ups. He tried off Symbicort but resumed after 10 days because of increasing dyspnea, tightness and chest congestion. Still on amiodarone. Has not had major cardiac problems since last here.  Denies usual cough or wheeze. Ok around home activities, but deer hunting, will have to stop to rest while climbing a hill.   Review of Systems Constitutional:   No weight loss, night sweats,  Fevers, chills, fatigue, lassitude. HEENT:   No headaches,  Difficulty swallowing,  Tooth/dental problems,  Sore throat,                No sneezing, itching, ear ache, nasal congestion, post nasal drip,   CV:  No chest pain,  Orthopnea, PND, swelling in lower extremities, anasarca, dizziness, palpitations  GI  No heartburn, indigestion, abdominal pain, nausea, vomiting, diarrhea, change in bowel habits, loss of appetite  Resp: No acute shortness of breath with exertion or at rest.  No excess mucus, no productive cough,  No non-productive cough,  No coughing up of blood.  No change in color of mucus.  No wheezing.    Skin: no rash or lesions.  GU: no dysuria, change in color of urine, no urgency or frequency.  No flank pain.  MS:  No joint pain or swelling.  No decreased range of motion.  No back pain.  Psych:  No change in mood or affect. No depression or anxiety.  No memory loss.     Objective:   Physical Exam General- Alert, Oriented, Affect-appropriate, Distress- none acute  Skin- rash-none, lesions- none, excoriation- none  Lymphadenopathy- none  Head- atraumatic  Eyes- Gross vision intact, PERRLA, conjunctivae clear secretions  Ears- Hearing, canals, Tm- normal  Nose- Clear, No- Septal dev, mucus, polyps, erosion, perforation   Throat-  Mallampati II , mucosa clear , drainage- none, tonsils- atrophic  Neck- flexible , trachea midline, no stridor , thyroid nl, carotid no bruit  Chest - symmetrical excursion , unlabored     Heart/CV- RRR , no murmur , no gallop  , no rub, nl s1 s2                     - JVD- none , edema- none, stasis changes- none, varices- none     Lung- clear to P&A, wheeze- none, cough- none , dullness-none, rub- none     Chest wall-  Pacemaker left  Abd- tender-no, distended-no, bowel sounds-present, HSM- no  Br/ Gen/ Rectal- Not done, not indicated  Extrem- cyanosis- none, clubbing, none, atrophy- none, strength- nl  Neuro- grossly intact to observation          Assessment & Plan:

## 2011-05-24 NOTE — Assessment & Plan Note (Addendum)
Well controlled. Current meds are sufficient.

## 2011-05-24 NOTE — Assessment & Plan Note (Addendum)
We have not seen clear radiologic changes of asbestos exposure. Will follow-up CXR today, but sounds good

## 2011-05-24 NOTE — Patient Instructions (Signed)
Order- CXR   Dx asbestosis, Asthma  Continue present meds. Please call as needed

## 2011-05-26 ENCOUNTER — Encounter: Payer: Self-pay | Admitting: Internal Medicine

## 2011-05-26 ENCOUNTER — Telehealth: Payer: Self-pay | Admitting: Internal Medicine

## 2011-05-26 NOTE — Telephone Encounter (Signed)
Called and spoke with pt.  Pt aware of cxr results.  

## 2011-05-26 NOTE — Progress Notes (Signed)
Quick Note:  LMTCB ______ 

## 2011-07-27 ENCOUNTER — Encounter: Payer: Self-pay | Admitting: Internal Medicine

## 2011-07-28 ENCOUNTER — Ambulatory Visit: Payer: Medicare Other | Admitting: Internal Medicine

## 2011-08-03 LAB — CBC
HCT: 41.1
Hemoglobin: 13.9
MCHC: 33.8
MCV: 92.7
Platelets: 126 — ABNORMAL LOW
Platelets: 159
WBC: 5

## 2011-08-03 LAB — BASIC METABOLIC PANEL
BUN: 22
BUN: 25 — ABNORMAL HIGH
CO2: 29
Calcium: 9
Chloride: 102
Creatinine, Ser: 1.38
GFR calc non Af Amer: 46 — ABNORMAL LOW
Glucose, Bld: 99
Potassium: 3.4 — ABNORMAL LOW
Sodium: 141

## 2011-08-03 LAB — URINALYSIS, ROUTINE W REFLEX MICROSCOPIC
Glucose, UA: NEGATIVE
Hgb urine dipstick: NEGATIVE
Ketones, ur: NEGATIVE
Protein, ur: NEGATIVE

## 2011-08-03 LAB — B-NATRIURETIC PEPTIDE (CONVERTED LAB): Pro B Natriuretic peptide (BNP): 1137 — ABNORMAL HIGH

## 2011-08-03 LAB — PROTIME-INR: INR: 1.5

## 2011-08-08 ENCOUNTER — Other Ambulatory Visit: Payer: Self-pay | Admitting: Internal Medicine

## 2011-08-08 NOTE — Telephone Encounter (Signed)
Have approved but check chart- may need appt.

## 2011-08-08 NOTE — Telephone Encounter (Signed)
Approve but may need appointment

## 2011-10-05 ENCOUNTER — Other Ambulatory Visit: Payer: Self-pay | Admitting: Internal Medicine

## 2012-01-23 ENCOUNTER — Other Ambulatory Visit: Payer: Self-pay

## 2012-01-23 MED ORDER — SYNTHROID 125 MCG PO TABS: 125.0000 ug | ORAL_TABLET | Freq: Every day | ORAL | Status: DC

## 2012-02-21 ENCOUNTER — Telehealth: Payer: Self-pay | Admitting: Internal Medicine

## 2012-02-22 ENCOUNTER — Other Ambulatory Visit: Payer: Self-pay

## 2012-02-22 MED ORDER — ATORVASTATIN CALCIUM 10 MG PO TABS: 10.0000 mg | ORAL_TABLET | Freq: Every day | ORAL | Status: DC

## 2012-02-23 NOTE — Telephone Encounter (Signed)
Rx faxed per Odette Horns.

## 2012-03-08 ENCOUNTER — Other Ambulatory Visit: Payer: Self-pay | Admitting: Internal Medicine

## 2012-04-19 ENCOUNTER — Telehealth: Payer: Self-pay | Admitting: Internal Medicine

## 2012-04-19 NOTE — Telephone Encounter (Signed)
He can have labs drawn for CPE now and book CPE in July

## 2012-04-19 NOTE — Telephone Encounter (Signed)
CPE labs to be done 6/11. CPE sched for 7/26  @ 3pm. He will see his Dr. Clarene Duke on 6/20 and we'll send his labs over to him.

## 2012-04-24 ENCOUNTER — Other Ambulatory Visit: Payer: Self-pay | Admitting: Internal Medicine

## 2012-04-24 ENCOUNTER — Other Ambulatory Visit: Payer: Medicare Other | Admitting: Internal Medicine

## 2012-04-24 DIAGNOSIS — Z79899 Other long term (current) drug therapy: Secondary | ICD-10-CM

## 2012-04-24 DIAGNOSIS — Z8679 Personal history of other diseases of the circulatory system: Secondary | ICD-10-CM

## 2012-04-24 DIAGNOSIS — J449 Chronic obstructive pulmonary disease, unspecified: Secondary | ICD-10-CM

## 2012-04-24 DIAGNOSIS — I2510 Atherosclerotic heart disease of native coronary artery without angina pectoris: Secondary | ICD-10-CM

## 2012-04-25 LAB — CBC WITH DIFFERENTIAL/PLATELET
Eosinophils Absolute: 0.1 10*3/uL (ref 0.0–0.7)
Hemoglobin: 12.5 g/dL — ABNORMAL LOW (ref 13.0–17.0)
Lymphocytes Relative: 23 % (ref 12–46)
Lymphs Abs: 0.8 10*3/uL (ref 0.7–4.0)
MCH: 30.6 pg (ref 26.0–34.0)
MCV: 91.4 fL (ref 78.0–100.0)
Monocytes Relative: 11 % (ref 3–12)
Neutrophils Relative %: 62 % (ref 43–77)
RBC: 4.08 MIL/uL — ABNORMAL LOW (ref 4.22–5.81)

## 2012-04-25 LAB — CMP AND LIVER
Alkaline Phosphatase: 64 U/L (ref 39–117)
BUN: 22 mg/dL (ref 6–23)
Bilirubin, Direct: 0.1 mg/dL (ref 0.0–0.3)
Creat: 1.38 mg/dL — ABNORMAL HIGH (ref 0.50–1.35)
Glucose, Bld: 100 mg/dL — ABNORMAL HIGH (ref 70–99)
Indirect Bilirubin: 0.6 mg/dL (ref 0.0–0.9)
Sodium: 141 mEq/L (ref 135–145)
Total Bilirubin: 0.7 mg/dL (ref 0.3–1.2)
Total Protein: 6.6 g/dL (ref 6.0–8.3)

## 2012-04-25 LAB — LIPID PANEL
HDL: 30 mg/dL — ABNORMAL LOW (ref >39)
Total CHOL/HDL Ratio: 4.1 Ratio
Triglycerides: 147 mg/dL (ref <150)

## 2012-04-25 LAB — TSH: TSH: 0.707 u[IU]/mL (ref 0.350–4.500)

## 2012-05-22 ENCOUNTER — Ambulatory Visit (INDEPENDENT_AMBULATORY_CARE_PROVIDER_SITE_OTHER): Payer: Medicare Other | Admitting: Internal Medicine

## 2012-05-22 ENCOUNTER — Ambulatory Visit (INDEPENDENT_AMBULATORY_CARE_PROVIDER_SITE_OTHER)
Admission: RE | Admit: 2012-05-22 | Discharge: 2012-05-22 | Disposition: A | Discharge status: Home / Self Care | Payer: Medicare Other | Source: Ambulatory Visit | Attending: Internal Medicine | Admitting: Internal Medicine

## 2012-05-22 ENCOUNTER — Encounter: Payer: Self-pay | Admitting: Internal Medicine

## 2012-05-22 VITALS — BP 108/76 | HR 80 | Ht 71.0 in | Wt 175.4 lb

## 2012-05-22 DIAGNOSIS — J61 Pneumoconiosis due to asbestos and other mineral fibers: Secondary | ICD-10-CM

## 2012-05-22 DIAGNOSIS — Z8679 Personal history of other diseases of the circulatory system: Secondary | ICD-10-CM

## 2012-05-22 DIAGNOSIS — J4489 Other specified chronic obstructive pulmonary disease: Secondary | ICD-10-CM

## 2012-05-22 DIAGNOSIS — J449 Chronic obstructive pulmonary disease, unspecified: Secondary | ICD-10-CM

## 2012-05-22 MED ORDER — BUDESONIDE-FORMOTEROL FUMARATE 160-4.5 MCG/ACT IN AERO: 2.0000 | INHALATION_SPRAY | Freq: Two times a day (BID) | RESPIRATORY_TRACT | Status: DC

## 2012-05-22 NOTE — Patient Instructions (Addendum)
Script to refill Symbicort  Order CXR- dx COPD, asbestosis

## 2012-05-22 NOTE — Progress Notes (Signed)
Subjective:    Patient ID: [name], male    DOB: [birth year], 78 y.o.   MRN: [id number]  HPI 05/24/11- COPD, Asbestosis, chronic granulomatous disease, hx AFib Last here - February 01, 2010 after pacemaker He has had a good year without major flare ups. He tried off Symbicort but resumed after 10 days because of increasing dyspnea, tightness and chest congestion. Still on amiodarone. Has not had major cardiac problems since last here.  Denies usual cough or wheeze. Ok around home activities, but deer hunting, will have to stop to rest while climbing a hill.   05/22/12-  86 yoM never smoker followed for COPD, Asbestosis, complicated by chronic granulomatous disease, hx AFib:  Pt states no complaints-breathing is good.  He finds one puff of Symbicort twice daily sufficient. Does not use a rescue inhaler. No edema on furosemide, with an occasional extra dose. Continues amiodarone with no recent cardiac event CXR 05/26/11 IMPRESSION:  Stable cardiomegaly and chronic pulmonary venous hypertension. No  acute findings.  Original Report Authenticated By: Danae Orleans, M.D.   ROS-see HPI Constitutional:   No-   weight loss, night sweats, fevers, chills, fatigue, lassitude. HEENT:   No-  headaches, difficulty swallowing, tooth/dental problems, sore throat,       No-  sneezing, itching, ear ache, nasal congestion, post nasal drip,  CV:  No-   chest pain, orthopnea, PND, swelling in lower extremities, anasarca, dizziness, palpitations Resp: No-  acute shortness of breath with exertion or at rest.              No-   productive cough,  No non-productive cough,  No- coughing up of blood.              No-   change in color of mucus.  No- wheezing.   Skin: No-   rash or lesions. GI:  No-   heartburn, indigestion, abdominal pain, nausea, vomiting,  GU:  MS:  No-   joint pain or swelling.  Neuro-     nothing unusual Psych:  No- change in mood or affect. No depression or anxiety.  No memory  loss.  OBJ- Physical Exam BP 108/76  Pulse 80  Ht 5\' 11"  (1.803 m)  Wt 175 lb 6.4 oz (79.561 kg)  BMI 24.46 kg/m2  SpO2 98%  General- Alert, Oriented, Affect-appropriate, Distress- none acute Skin- rash-none, lesions- none, excoriation- none Lymphadenopathy- none Head- atraumatic            Eyes- Gross vision intact, PERRLA, conjunctivae and secretions clear            Ears- Hearing, canals-normal            Nose- Clear, no-Septal dev, mucus, polyps, erosion, perforation             Throat- Mallampati II , mucosa clear , drainage- none, tonsils- atrophic Neck- flexible , trachea midline, no stridor , thyroid nl, carotid no bruit Chest - symmetrical excursion , unlabored           Heart/CV- RRR , no murmur , no gallop  , no rub, nl s1, ? Increased P2                           - JVD- none , edema- none, stasis changes- none, varices- none           Lung- clear to P&A, unlabored, wheeze- none, cough- none , dullness-none, rub- none  Chest wall-  Abd-  Br/ Gen/ Rectal- Not done, not indicated Extrem- cyanosis- none, clubbing, none, atrophy- none, strength- nl Neuro- grossly intact to observation

## 2012-05-25 ENCOUNTER — Telehealth: Payer: Self-pay | Admitting: Internal Medicine

## 2012-05-25 NOTE — Telephone Encounter (Signed)
Notes Recorded by Waymon Budge, MD on 05/22/2012 at 8:53 PM Nothing active. Chronic heart changes including pacemaker and old surgery. Small scar might possibly be from asbestos, but too small to be sure.  I spoke with patient about results and he verbalized understanding and had no questions

## 2012-05-25 NOTE — Progress Notes (Signed)
Quick Note:  LMTCB ______ 

## 2012-05-28 NOTE — Assessment & Plan Note (Signed)
We discussed potential impact of chronic amiodarone on his lungs. For now he is doing well.

## 2012-05-28 NOTE — Assessment & Plan Note (Addendum)
He finds Symbicort sufficient as a maintenance medicine without need for rescue inhaler. The reactive component of his lung disease has been well controlled. Watch for pulmonary hypertension Plan-chest x-ray

## 2012-06-08 ENCOUNTER — Ambulatory Visit (INDEPENDENT_AMBULATORY_CARE_PROVIDER_SITE_OTHER): Payer: Medicare Other | Admitting: Internal Medicine

## 2012-06-08 ENCOUNTER — Encounter: Payer: Self-pay | Admitting: Internal Medicine

## 2012-06-08 VITALS — BP 126/70 | HR 76 | Temp 97.8°F | Ht 70.25 in | Wt 173.0 lb

## 2012-06-08 DIAGNOSIS — D531 Other megaloblastic anemias, not elsewhere classified: Secondary | ICD-10-CM

## 2012-06-08 DIAGNOSIS — R6889 Other general symptoms and signs: Secondary | ICD-10-CM

## 2012-06-08 DIAGNOSIS — D649 Anemia, unspecified: Secondary | ICD-10-CM

## 2012-06-08 DIAGNOSIS — D638 Anemia in other chronic diseases classified elsewhere: Secondary | ICD-10-CM

## 2012-06-08 DIAGNOSIS — Z0000 Encounter for general adult medical examination without abnormal findings: Secondary | ICD-10-CM

## 2012-06-08 DIAGNOSIS — D539 Nutritional anemia, unspecified: Secondary | ICD-10-CM

## 2012-06-08 DIAGNOSIS — R7301 Impaired fasting glucose: Secondary | ICD-10-CM

## 2012-06-08 DIAGNOSIS — J449 Chronic obstructive pulmonary disease, unspecified: Secondary | ICD-10-CM

## 2012-06-08 DIAGNOSIS — Z125 Encounter for screening for malignant neoplasm of prostate: Secondary | ICD-10-CM

## 2012-06-08 LAB — CREATININE, SERUM: Creat: 1.12 mg/dL (ref 0.50–1.35)

## 2012-06-08 LAB — RETICULOCYTES
ABS Retic: 63.4 10*3/uL (ref 19.0–186.0)
RBC.: 4.53 MIL/uL (ref 4.22–5.81)
Retic Ct Pct: 1.4 % (ref 0.4–2.3)

## 2012-06-08 LAB — VITAMIN B12: Vitamin B-12: >2000 pg/mL — ABNORMAL HIGH (ref 211–911)

## 2012-06-08 NOTE — Progress Notes (Signed)
Subjective:    Patient ID: [name], male    DOB: 1926-03-21, 78 y.o.   MRN: [id number] 78 year old white male with history of coronary artery disease, right carotid bruit, AICD device, hypertension, hyperlipidemia, hypothyroidism, glaucoma in today for health maintenance and evaluation of medical problems. He also has asthma and pulmonary asbestosis followed by Dr. Griselda Miner and treated with albuterol and Symbicort inhalers. He is retired Personnel officer. In 1999 he had a syncopal episode due to AV nodal disease and fell from a tree fracturing T7 and C7. A pacemaker was inserted at that time. The device was upgraded in 2009 to a [employer] promote device. His ejection fraction is approximately 30%. History of chronic atrial fibrillation on chronic Coumadin therapy. He also has been on amiodarone. He was on Lipitor for while but that was discontinued by Dr. Clarene Duke and he feels better off that medication. Sometimes feels a bit orthostatic with captopril. He does have recurrent respiratory infections from time to time. Enjoys dancing at public dances throughout the [place] several nights a week. He is an Teacher, music. He is a widow with 5 adult children including twin daughters. Only son lives in [place]. 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  Family history: Mother died with complications from dementia. Father died of a sudden MI. No siblings.  Social history: Occasional wine. Never smoked.  Coronary artery bypass surgery times 10/17/1994  Right inguinal hernia repair 1997.  Inferior MI 1992  Angioplasty right coronary artery April 1992, July 1992, September 1992  PTCA of the circumflex November 1993  Colonoscopy 2008  Pneumovax June 2004  History of urticaria and allergic rhinitis. Has had allergy skin test positive to ragweed tree pollens and molds tested by Dr. Quinn Plowman in 2004. This was because he had had hives off and on for 3 years. Treated for glaucoma by Dr.  Dione Booze. HPI Subjective:   Patient presents for Medicare Annual/Subsequent preventive examination.   Review Past Medical/Family/Social: see  Above    Risk Factors  Current exercise habits: dance twice a week Dietary issues discussed: not overweight  Cardiac risk factors: CAD   Depression Screen  (Note: if answer to either of the following is "Yes", a more complete depression screening is indicated)  Over the past two weeks, have you felt down, depressed or hopeless? no Over the past two weeks, have you felt little interest or pleasure in doing things? no Have you lost interest or pleasure in daily life? no Do you often feel hopeless? no Do you cry easily over simple problems? No   Activities of Daily Living  In your present state of health, do you have any difficulty performing the following activities?:  Driving? No  Managing money? No  Feeding yourself? No  Getting from bed to chair? No  Climbing a flight of stairs? No  Preparing food and eating?: No  Bathing or showering? No  Getting dressed: No  Getting to the toilet? No  Using the toilet:No  Moving around from place to place: No  In the past year have you fallen or had a near fall?:No  Are you sexually active? No  Do you have more than one partner? No   Hearing Difficulties: No  Do you often ask people to speak up or repeat themselves? No  Do you experience ringing or noises in your ears? Yes  Do you have difficulty understanding soft or whispered voices? No  Do you feel that you have a problem with memory? no Do  you often misplace items? No  Do you feel safe at home? Yes   Cognitive Testing  Alert? Yes Normal Appearance?Yes  Oriented to person? Yes Place? Yes  Time? Yes  Recall of three objects? Yes  Can perform simple calculations? Yes  Displays appropriate judgment?Yes  Can read the correct time from a watch face?Yes   List the Names of Other Physician/Practitioners you currently use: Dr. Caprice Kluver,  cardiologist and also ophthalmologist for glaucoma treatment   Indicate any recent Medical Services you may have received from other than [facility] providers in the past year (date may be approximate).   Screening Tests / Date Colonoscopy  -2008                   Zostavax -2013 Mammogram not applicable  Influenza Vaccine 2012 Tetanus/tdap  Pneumovax 2004; ophthalmologist    Objective:      General appearance: Appears stated age and mildly obese  Head: Normocephalic, without obvious abnormality, atraumatic  Eyes: conj clear, EOMi PEERLA  Ears: normal TM's and external ear canals both ears  Nose: Nares normal. Septum midline. Mucosa normal. No drainage or sinus tenderness.  Throat: lips, mucosa, and tongue normal; teeth and gums normal  Neck: no adenopathy, no carotid bruit, no JVD, supple, symmetrical, trachea midline and thyroid not enlarged, symmetric, no tenderness/mass/nodules  No CVA tenderness.  Lungs: clear to auscultation bilaterally  Breasts: normal appearance, no masses or tenderness, top of the pacemaker on left upper chest. Incision well-healed. It is tender.  Heart: regular rate and rhythm, S1, S2 normal, no murmur, click, rub or gallop  Abdomen: soft, non-tender; bowel sounds normal; no masses, no organomegaly  Musculoskeletal: ROM normal in all joints, no crepitus, no deformity, Normal muscle strengthen. Back  is symmetric, no curvature. Skin: Skin color, texture, turgor normal. No rashes or lesions  Lymph nodes: Cervical, supraclavicular, and axillary nodes normal.  Neurologic: CN 2 -12 Normal, Normal symmetric reflexes. Normal coordination and gait  Psych: Alert & Oriented x 3, Mood appear stable.    Assessment:    Annual wellness medicare exam   Plan:    During the course of the visit the patient was educated and counseled about appropriate screening and preventive services. Had colonoscopy 2008 patient is now 78 years old. Repeat testing is unlikely to be  necessary. Zostavax vaccine given 2013. Is to get annual influenza immunization. Has had Pneumovax immunization.  Diet review for nutrition referral? Yes ____ Not Indicated __x__  Patient Instructions (the written plan) was given to the patient.  Medicare Attestation  I have personally reviewed:  The patient's medical and social history  Their use of alcohol, tobacco or illicit drugs  Their current medications and supplements  The patient's functional ability including ADLs,fall risks, home safety risks, cognitive, and hearing and visual impairment  Diet and physical activities  Evidence for depression or mood disorders  The patient's weight, height, BMI, and visual acuity have been recorded in the chart. I have made referrals, counseling, and provided education to the patient based on review of the above and I have provided the patient with a written personalized care plan for preventive services.         Review of Systems  Constitutional: Negative.   HENT: Negative.   Eyes: Negative.   Respiratory: Negative.   Cardiovascular: Negative.   Gastrointestinal: Negative.   Genitourinary: Negative.   Musculoskeletal: Negative.   Neurological: Negative.   Hematological: Negative.   Psychiatric/Behavioral: Negative.  Objective:   Physical Exam  Vitals reviewed. Constitutional: He is oriented to person, place, and time. He appears well-developed and well-nourished. No distress.  HENT:  Head: Normocephalic and atraumatic.  Right Ear: External ear normal.  Left Ear: External ear normal.  Mouth/Throat: Oropharynx is clear and moist. No oropharyngeal exudate.  Eyes: Conjunctivae normal and EOM are normal. Pupils are equal, round, and reactive to light. Right eye exhibits no discharge. Left eye exhibits no discharge. No scleral icterus.  Neck: Neck supple. No JVD present. No thyromegaly present.  Cardiovascular: Normal rate, regular rhythm, normal heart sounds and intact distal  pulses.   No murmur heard. Pulmonary/Chest: Effort normal and breath sounds normal. He has no wheezes. He has no rales.  Abdominal: Soft. Bowel sounds are normal. He exhibits no distension and no mass. There is no tenderness. There is no rebound and no guarding.  Genitourinary: Prostate normal.  Musculoskeletal: Normal range of motion. He exhibits no edema and no tenderness.  Lymphadenopathy:    He has no cervical adenopathy.  Neurological: He is alert and oriented to person, place, and time. He has normal reflexes. No cranial nerve deficit. Coordination normal.  Skin: Skin is warm and dry. No rash noted. He is not diaphoretic.  Psychiatric: He has a normal mood and affect. His behavior is normal. Judgment and thought content normal.          Assessment & Plan:  Coronary artery disease  AICD implanted 2009 biventricular  History of chronic atrial fibrillation on chronic Coumadin therapy  Ejection fraction approximately 30%  Hypothyroidism  Glaucoma  Pulmonary asbestosis  Asthma  Allergic rhinitis  History of chronic urticaria  History of hypertension  History of hyperlipidemia  Glaucoma  Plan: Continue same medications and return in 6 months.

## 2012-06-19 ENCOUNTER — Other Ambulatory Visit: Payer: Medicare Other | Admitting: Internal Medicine

## 2012-06-19 DIAGNOSIS — R6889 Other general symptoms and signs: Secondary | ICD-10-CM

## 2012-06-19 LAB — CREATININE, SERUM: Creat: 1.19 mg/dL (ref 0.50–1.35)

## 2012-06-22 LAB — CREATININE CLEARANCE, URINE, 24 HOUR
Creatinine Clearance: 69 mL/min — ABNORMAL LOW (ref 75–125)
Creatinine, 24H Ur: 1175 mg/d (ref 800–2000)
Creatinine, Urine: 65.3 mg/dL
Creatinine: 1.19 mg/dL (ref 0.50–1.35)

## 2012-07-30 ENCOUNTER — Other Ambulatory Visit: Payer: Self-pay | Admitting: Internal Medicine

## 2012-08-02 ENCOUNTER — Encounter: Payer: Self-pay | Admitting: Internal Medicine

## 2012-08-02 ENCOUNTER — Ambulatory Visit (INDEPENDENT_AMBULATORY_CARE_PROVIDER_SITE_OTHER): Payer: Medicare Other | Admitting: Internal Medicine

## 2012-08-02 VITALS — BP 142/82 | HR 84 | Temp 97.1°F | Wt 174.0 lb

## 2012-08-02 DIAGNOSIS — Z95810 Presence of automatic (implantable) cardiac defibrillator: Secondary | ICD-10-CM

## 2012-08-02 DIAGNOSIS — R9430 Abnormal result of cardiovascular function study, unspecified: Secondary | ICD-10-CM

## 2012-08-02 DIAGNOSIS — Z8679 Personal history of other diseases of the circulatory system: Secondary | ICD-10-CM

## 2012-08-02 DIAGNOSIS — H409 Unspecified glaucoma: Secondary | ICD-10-CM

## 2012-08-02 DIAGNOSIS — E039 Hypothyroidism, unspecified: Secondary | ICD-10-CM

## 2012-08-02 DIAGNOSIS — I509 Heart failure, unspecified: Secondary | ICD-10-CM

## 2012-08-02 DIAGNOSIS — J40 Bronchitis, not specified as acute or chronic: Secondary | ICD-10-CM

## 2012-08-02 DIAGNOSIS — R0989 Other specified symptoms and signs involving the circulatory and respiratory systems: Secondary | ICD-10-CM

## 2012-08-02 DIAGNOSIS — Z862 Personal history of diseases of the blood and blood-forming organs and certain disorders involving the immune mechanism: Secondary | ICD-10-CM

## 2012-08-02 DIAGNOSIS — Z8639 Personal history of other endocrine, nutritional and metabolic disease: Secondary | ICD-10-CM

## 2012-08-02 MED ORDER — CEFTRIAXONE SODIUM 1 G IJ SOLR
1.0000 g | Freq: Once | INTRAMUSCULAR | Status: AC
  Administered 2012-08-02: 1 g via INTRAMUSCULAR

## 2012-08-02 NOTE — Patient Instructions (Signed)
Rocephin one gram IM given today. Levaquin 500 mg daily for 10 days.

## 2012-08-06 ENCOUNTER — Other Ambulatory Visit: Payer: Self-pay | Admitting: Dermatology

## 2012-08-10 ENCOUNTER — Encounter: Payer: Self-pay | Admitting: Internal Medicine

## 2012-08-10 DIAGNOSIS — Z8639 Personal history of other endocrine, nutritional and metabolic disease: Secondary | ICD-10-CM | POA: Insufficient documentation

## 2012-08-10 DIAGNOSIS — I5042 Chronic combined systolic (congestive) and diastolic (congestive) heart failure: Secondary | ICD-10-CM | POA: Insufficient documentation

## 2012-08-10 DIAGNOSIS — Z95810 Presence of automatic (implantable) cardiac defibrillator: Secondary | ICD-10-CM | POA: Insufficient documentation

## 2012-08-10 DIAGNOSIS — Z8679 Personal history of other diseases of the circulatory system: Secondary | ICD-10-CM | POA: Insufficient documentation

## 2012-08-10 DIAGNOSIS — H409 Unspecified glaucoma: Secondary | ICD-10-CM | POA: Insufficient documentation

## 2012-08-10 DIAGNOSIS — E039 Hypothyroidism, unspecified: Secondary | ICD-10-CM | POA: Insufficient documentation

## 2012-08-10 NOTE — Progress Notes (Signed)
  Subjective:    Patient ID: [name], male    DOB: 1925/12/04, 78 y.o.   MRN: [id number]  HPI 78 year old white male with coronary artery disease, hypothyroidism, asthma, pulmonary asbestosis in today with URI symptoms for several days. Doesn't think that he's had any fever but has cough and congestion. Cough is not productive. No shaking chills. Patient has a pacemaker. Cardiologist is Dr. Clarene Duke. He has been taken off of Lipitor by cardiologist. Says he feels better off of it with less myalgias. He is  on chronic Coumadin therapy. Uses Symbicort and albuterol inhalers. He has glaucoma. History of right carotid bruit. History of hypertension.  Anterior MI 1992, angioplasty the right coronary artery April 1992, July 1992, September 1992. PTCA of the circumflex November 1993. Syncope while deer hunting in a tree stay and November 1999 with subsequent T7 and C7 fractures. This resulted in him obtaining a pacemaker significant AV nodal disease. He initially had a DDD pacemaker however in 2009 he had an AICD placed. History of paroxysmal atrial fibrillation and underwent cardioversion November 2011. He is on Coumadin for this.  He sometimes has some mild orthostasis which is thought to be due to Captopril. His ejection fraction has been approximately 30%. He has a Arts administrator in a  V V I R mode, upper rate 120, lower rate 170,  V- paced 99% of the time. Underlying rhythm is paced at 40. It is expected he will need battery change sometime in early 2014.  History of coronary artery bypass surgery times 4 in 1995. Right inguinal hernia repair 1997.  Pneumovax 2004, and gets annual influenza immunization. Zostavax vaccine August 2013.  Colonoscopy 2008.          Review of Systems     Objective:   Physical Exam HEENT exam: Pharynx is injected without exudate. TMs are clear. Neck is supple without adenopathy. Chest clear to auscultation. Cardiac exam regular rate and  rhythm normal S1 and S2. Extremities without edema. Skin is warm and dry. He is alert and oriented x3. Pulse oximetry is within normal limits.        Assessment & Plan:  Bronchitis: Given 1 g IM Rocephin and started on Levaquin 500 milligrams daily for one week. Call if symptoms do not improve.   Asthma-continue Symbicort and albuterol inhalers  Pulmonary asbestosis-stable followed by Dr. Maple Hudson  Hypothyroidism-stable on Synthroid   Coronary artery disease  Chronic atrial fibrillation on chronic Coumadin therapy  AICD biventricular device  History of mild congestive heart failure  History of hyperlipidemia-off statin therapy

## 2012-08-10 NOTE — Patient Instructions (Addendum)
Continue same medications and return in 6 months 

## 2012-08-14 ENCOUNTER — Telehealth: Payer: Self-pay | Admitting: Internal Medicine

## 2012-08-15 ENCOUNTER — Ambulatory Visit (INDEPENDENT_AMBULATORY_CARE_PROVIDER_SITE_OTHER): Payer: Medicare Other | Admitting: Internal Medicine

## 2012-08-15 DIAGNOSIS — Z23 Encounter for immunization: Secondary | ICD-10-CM

## 2012-10-22 ENCOUNTER — Ambulatory Visit
Admission: RE | Admit: 2012-10-22 | Discharge: 2012-10-22 | Disposition: A | Discharge status: Home / Self Care | Payer: Medicare Other | Source: Ambulatory Visit | Attending: Cardiovascular Disease | Admitting: Cardiovascular Disease

## 2012-10-22 ENCOUNTER — Other Ambulatory Visit: Payer: Self-pay | Admitting: Cardiovascular Disease

## 2012-10-22 DIAGNOSIS — Z01811 Encounter for preprocedural respiratory examination: Secondary | ICD-10-CM

## 2012-10-23 ENCOUNTER — Encounter (HOSPITAL_COMMUNITY): Payer: Self-pay | Admitting: Respiratory Therapy

## 2012-10-24 ENCOUNTER — Other Ambulatory Visit: Payer: Self-pay | Admitting: Cardiovascular Disease

## 2012-10-25 MED ORDER — CEFAZOLIN SODIUM-DEXTROSE 2-3 GM-% IV SOLR
2.0000 g | INTRAVENOUS | Status: AC
  Filled 2012-10-25: qty 50

## 2012-10-25 MED ORDER — SODIUM CHLORIDE 0.9 % IR SOLN
80.0000 mg | Status: AC
  Filled 2012-10-25: qty 2

## 2012-10-26 ENCOUNTER — Encounter (HOSPITAL_COMMUNITY)
Admission: RE | Disposition: A | Discharge status: Home / Self Care | Payer: Self-pay | Source: Ambulatory Visit | Attending: Cardiovascular Disease

## 2012-10-26 ENCOUNTER — Ambulatory Visit (HOSPITAL_COMMUNITY)
Admission: RE | Admit: 2012-10-26 | Discharge: 2012-10-26 | Disposition: A | Discharge status: Home / Self Care | Payer: Medicare Other | Source: Ambulatory Visit | Attending: Cardiovascular Disease | Admitting: Cardiovascular Disease

## 2012-10-26 DIAGNOSIS — Z4502 Encounter for adjustment and management of automatic implantable cardiac defibrillator: Secondary | ICD-10-CM | POA: Insufficient documentation

## 2012-10-26 HISTORY — PX: IMPLANTABLE CARDIOVERTER DEFIBRILLATOR (ICD) GENERATOR CHANGE: SHX5469

## 2012-10-26 SURGERY — ICD GENERATOR CHANGE
Anesthesia: LOCAL

## 2012-10-26 MED ORDER — SODIUM CHLORIDE 0.9 % IV SOLN
INTRAVENOUS | Status: DC
  Administered 2012-10-26: 13:00:00 via INTRAVENOUS

## 2012-10-26 MED ORDER — MUPIROCIN 2 % EX OINT
TOPICAL_OINTMENT | CUTANEOUS | Status: AC
  Administered 2012-10-26: 1 via NASAL
  Filled 2012-10-26: qty 22

## 2012-10-26 MED ORDER — MUPIROCIN 2 % EX OINT
TOPICAL_OINTMENT | Freq: Two times a day (BID) | CUTANEOUS | Status: DC
  Administered 2012-10-26: 1 via NASAL

## 2012-10-26 MED ORDER — SODIUM CHLORIDE 0.9 % IJ SOLN: 3.0000 mL | INTRAMUSCULAR | Status: DC | PRN

## 2012-10-26 MED ORDER — SODIUM CHLORIDE 0.45 % IV SOLN
INTRAVENOUS | Status: DC
  Administered 2012-10-26: 10:00:00 via INTRAVENOUS

## 2012-10-26 MED ORDER — ACETAMINOPHEN 325 MG PO TABS: 325.0000 mg | ORAL_TABLET | ORAL | Status: DC | PRN

## 2012-10-26 MED ORDER — HEPARIN (PORCINE) IN NACL 2-0.9 UNIT/ML-% IJ SOLN
INTRAMUSCULAR | Status: AC
  Filled 2012-10-26: qty 500

## 2012-10-26 MED ORDER — FENTANYL CITRATE 0.05 MG/ML IJ SOLN
INTRAMUSCULAR | Status: AC
  Filled 2012-10-26: qty 2

## 2012-10-26 MED ORDER — LIDOCAINE HCL (PF) 1 % IJ SOLN
INTRAMUSCULAR | Status: AC
  Filled 2012-10-26: qty 30

## 2012-10-26 MED ORDER — ONDANSETRON HCL 4 MG/2ML IJ SOLN: 4.0000 mg | Freq: Four times a day (QID) | INTRAMUSCULAR | Status: DC | PRN

## 2012-10-26 MED ORDER — MIDAZOLAM HCL 2 MG/2ML IJ SOLN
INTRAMUSCULAR | Status: AC
  Filled 2012-10-26: qty 2

## 2012-10-26 MED ORDER — CHLORHEXIDINE GLUCONATE 4 % EX LIQD
60.0000 mL | Freq: Once | CUTANEOUS | Status: AC
  Administered 2012-10-26: 4 via TOPICAL

## 2012-10-26 MED ORDER — CEFAZOLIN SODIUM-DEXTROSE 2-3 GM-% IV SOLR
INTRAVENOUS | Status: AC
  Filled 2012-10-26: qty 50

## 2012-10-26 NOTE — CV Procedure (Signed)
[name],[name] Male, 78 y.o., 06-18-1926  Location: [facility]  Bed: NONE  MRN: [id number]  CSN: [id number]  Admit Dt: 10/26/12  Procedure report  Procedure performed:  1. Dual chamber CRT-D generator changeout  2. Light sedation  Reason for procedure:  1. Device generator at elective replacement interval  Procedure performed by:  Thurmon Fair, MD  Complications:  None  Estimated blood loss:  <5 mL  Medications administered during procedure:  Ancef 2 g intravenously,  lidocaine 1% 30 mL locally, fentanyl 50 mcg intravenously, Versed 3 mg intravenously Device details:   [employer] model number [id number], serial number [id number] Right atrial lead (chronic) SJM 1388TC/52, serial number [id number] (implanted 10/20/1998) Right ventricular lead (chronic)  SJM 7121/65, serial number [id number] (implanted 11/27/2007) Left ventricular lead  SJM 1156T/86, serial number [id number], (implanted 11/27/2007)  Explanted generator SJM Promote 3207-36, serial number  [id number] (implanted 11/27/2007)  Procedure details:  After the risks and benefits of the procedure were discussed the patient provided informed consent. She was brought to the cardiac catheter lab in the fasting state. The patient was prepped and draped in usual sterile fashion. Local anesthesia with 1% lidocaine was administered to to the left infraclavicular area. A 5-6cm horizontal incision was made parallel with and 2-3 cm caudal to the left clavicle. An older scar was seen closer to the left clavicle. Using minimal electrocautery and mostly sharp and blunt dissection the prepectoral pocket was opened carefully to avoid injury to the loops of chronic leads. Extensive dissection was necessary. Large subcutaneous veins (posible signs of collateral vein formation from the left upper extremity) were repeatedly encountered and ligated for hemostasis. Multiple loops of leads, encased in capsule scar, were carefully detached  from around the device. The device was explanted. The pocket had to be extensively revised to accomodate the new device, with a different shape and size from the old one. The pocket was carefully inspected for hemostasis and flushed with copious amounts of antibiotic solution.   The leads were disconnected from the old generator and testing of the lead parameters later showed excellent values. The new generator was connected to the chronic leads, with appropriate pacing noted.   The entire system was then carefully inserted in the pocket with care been taking that the leads and device assumed a comfortable position without pressure on the incision. Great care was taken that the leads be located deep to the generator. The pocket was then closed in layers using 2 layers of 2-0 Vicryl and cutaneous staples after which a sterile dressing was applied.   At the end of the procedure the following lead parameters were encountered:   Right atrial lead sensed P waves 0.4-1.2 mV (atrial fibrillation), impedance 380 ohms, threshold not tested. Right ventricular lead sensed R waves:  None detected, impedance 400 ohms, threshold 0.75 at 0.5 ms pulse width. High voltage 47 ohm. Left ventricular lead sensed R waves  Paced R waves 14 mV mV, impedance 560 ohms, threshold 1.5 at 1.0 ms pulse width (LV ring to RV coil).  Thurmon Fair, MD, [facility] [phone] office [phone] pager  10/26/2012 12:29 PM

## 2012-10-26 NOTE — H&P (Signed)
Date of Initial H&P: 10/22/2012  History reviewed, patient examined, no change in status, stable for surgery. CRT-D device at [facility]. Here for generator change. This procedure has been fully reviewed with the patient and written informed consent has been obtained.  Thurmon Fair, MD, [facility] [phone] office [phone] pager 10/26/2012 12:16 PM

## 2012-12-13 ENCOUNTER — Other Ambulatory Visit: Payer: Medicare Other | Admitting: Internal Medicine

## 2012-12-13 DIAGNOSIS — I10 Essential (primary) hypertension: Secondary | ICD-10-CM

## 2012-12-13 DIAGNOSIS — E785 Hyperlipidemia, unspecified: Secondary | ICD-10-CM

## 2012-12-13 DIAGNOSIS — E039 Hypothyroidism, unspecified: Secondary | ICD-10-CM

## 2012-12-13 LAB — LIPID PANEL
HDL: 28 mg/dL — ABNORMAL LOW (ref >39)
LDL Cholesterol: 92 mg/dL (ref 0–99)

## 2012-12-13 LAB — CBC WITH DIFFERENTIAL/PLATELET
Eosinophils Absolute: 0.1 10*3/uL (ref 0.0–0.7)
Eosinophils Relative: 3 % (ref 0–5)
HCT: 39.5 % (ref 39.0–52.0)
Hemoglobin: 13.4 g/dL (ref 13.0–17.0)
Lymphs Abs: 0.9 10*3/uL (ref 0.7–4.0)
MCH: 31.2 pg (ref 26.0–34.0)
MCV: 92.1 fL (ref 78.0–100.0)
Monocytes Absolute: 0.3 10*3/uL (ref 0.1–1.0)
Monocytes Relative: 8 % (ref 3–12)
RBC: 4.29 MIL/uL (ref 4.22–5.81)

## 2012-12-13 LAB — BASIC METABOLIC PANEL
CO2: 31 mEq/L (ref 19–32)
Calcium: 9.4 mg/dL (ref 8.4–10.5)
Glucose, Bld: 115 mg/dL — ABNORMAL HIGH (ref 70–99)
Sodium: 140 mEq/L (ref 135–145)

## 2012-12-14 ENCOUNTER — Ambulatory Visit (INDEPENDENT_AMBULATORY_CARE_PROVIDER_SITE_OTHER): Payer: Medicare Other | Admitting: Internal Medicine

## 2012-12-14 ENCOUNTER — Encounter: Payer: Self-pay | Admitting: Internal Medicine

## 2012-12-14 VITALS — BP 134/76 | HR 76 | Temp 96.9°F | Wt 177.0 lb

## 2012-12-14 DIAGNOSIS — I2510 Atherosclerotic heart disease of native coronary artery without angina pectoris: Secondary | ICD-10-CM

## 2012-12-14 DIAGNOSIS — Z8639 Personal history of other endocrine, nutritional and metabolic disease: Secondary | ICD-10-CM

## 2012-12-14 DIAGNOSIS — E039 Hypothyroidism, unspecified: Secondary | ICD-10-CM

## 2012-12-14 DIAGNOSIS — J61 Pneumoconiosis due to asbestos and other mineral fibers: Secondary | ICD-10-CM

## 2012-12-14 DIAGNOSIS — Z862 Personal history of diseases of the blood and blood-forming organs and certain disorders involving the immune mechanism: Secondary | ICD-10-CM

## 2012-12-14 DIAGNOSIS — Z95810 Presence of automatic (implantable) cardiac defibrillator: Secondary | ICD-10-CM

## 2012-12-14 DIAGNOSIS — I10 Essential (primary) hypertension: Secondary | ICD-10-CM

## 2012-12-15 ENCOUNTER — Encounter: Payer: Self-pay | Admitting: Internal Medicine

## 2012-12-15 NOTE — Patient Instructions (Addendum)
Continue same medications and return for physical exam in 6 months. Continue followup with cardiologist.

## 2012-12-15 NOTE — Progress Notes (Signed)
  Subjective:    Patient ID: [name], male    DOB: Jul 14, 1926, 79 y.o.   MRN: [id number]  HPI 79 year old white male in today for six-month followup. History of coronary artery disease status post anterior MI in 1992. He had angioplasty of the right coronary artery April 1992, July 1992, September 1992. PTCA of the circumflex November 1993. He had syncope while deer hunting in a tree stand in November 1999 with subsequent fall from the stand causing T7 and C7 fractures. This resulted in him obtaining a pacemaker for significant AV nodal disease. He initially had a DDD pacemaker, however in 2009 he had an AICD placed. He recently had pacemaker replaced. History of paroxysmal atrial fibrillation. Underwent cardioversion November 2011. He is on Coumadin for this. He sometimes has some mild orthostasis which is thought to be due to captopril. His ejection fraction has been approximately 30%.  History of coronary artery bypass surgery x 4 1995. Right inguinal hernia repair 1997.  Pneumovax given 2004. Gets annual influenza immunization. Had Zostavax vaccine August 2013 colonoscopy in 2008.  He has a history of pulmonary asbestosis. Gets bronchitis from time to time. Followed by [facility] as well as with our pulmonary. He has been taken off of Lipitor by cardiologist and says he feels better off of it would last myalgias. He uses Symbicort and albuterol inhalers. History of glaucoma. History of hypertension and right carotid bruit.  Continues to go to dances several nights a week. 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  Father died of a sudden MI. Mother died of complications of dementia. No siblings. He is a widow. Has 5 adult children including one set of twins. Maternal aunt with history of multiple myeloma died of sudden MI. Another maternal aunt died  of acute leukemia. Another maternal aunt with history of nasal plasmacytoma but not multiple myeloma treated successfully with radiation but died of mesenteric ischemia. Another maternal aunt died of complications of dementia and possible stroke.    Review of Systems     Objective:   Physical Exam skin warm and dry. Chest clear to auscultation. Pacemaker noted left upper anterior chest. Cardiac exam regular rate and rhythm normal S1 and S2. Extremities without edema. Neck supple without JVD thyromegaly or carotid bruits.        Assessment & Plan:  Hypothyroidism-on thyroid replacement therapy and TSH is within normal limits  Hyperlipidemia-stable off statin therapy  History of chronic Coumadin therapy for history of paroxysmal atrial fibrillation  Coronary artery disease-stable  History of pulmonary asbestosis-stable  History of hypertension-stable on current regimen  Plan: Return in 6 months for physical exam and evaluation of medical issues. Continue followup with cardiologist.

## 2012-12-31 ENCOUNTER — Telehealth: Payer: Self-pay

## 2012-12-31 ENCOUNTER — Telehealth: Payer: Self-pay | Admitting: Internal Medicine

## 2012-12-31 NOTE — Telephone Encounter (Signed)
Patient called this morning with nosebleed that would not stop after 10 minutes of holding pressure. He is on Coumadin. Says recent protime a couple weeks ago was 2.1. Usually does not have issues with anticoagulation. We will get him in to see ENT physician. Patient says cautery did not work and they packed his nose for 2 weeks. He called cardiologist. They had advised that he not take Coumadin for several days. He says nose is still oozing just a bit.

## 2012-12-31 NOTE — Telephone Encounter (Signed)
Patient call this am with nosebleed that he cannot stop. Is on Coumadin. Given instructions on how to pinch his nose and tilt head forward. Still bleeding. Referred to [facility]NT as walk-in. Patient informed

## 2013-01-23 ENCOUNTER — Other Ambulatory Visit: Payer: Self-pay | Admitting: Internal Medicine

## 2013-01-31 ENCOUNTER — Ambulatory Visit: Payer: Self-pay | Admitting: Cardiovascular Disease

## 2013-01-31 DIAGNOSIS — I480 Paroxysmal atrial fibrillation: Secondary | ICD-10-CM | POA: Insufficient documentation

## 2013-01-31 DIAGNOSIS — I4891 Unspecified atrial fibrillation: Secondary | ICD-10-CM

## 2013-01-31 DIAGNOSIS — Z7901 Long term (current) use of anticoagulants: Secondary | ICD-10-CM | POA: Insufficient documentation

## 2013-04-14 ENCOUNTER — Other Ambulatory Visit: Payer: Self-pay | Admitting: Cardiovascular Disease

## 2013-04-14 DIAGNOSIS — I428 Other cardiomyopathies: Secondary | ICD-10-CM

## 2013-04-14 LAB — ICD DEVICE OBSERVATION

## 2013-04-25 ENCOUNTER — Ambulatory Visit (INDEPENDENT_AMBULATORY_CARE_PROVIDER_SITE_OTHER): Payer: Medicare Other | Admitting: Pharmacist Clinician (PhC)/ Clinical Pharmacy Specialist

## 2013-04-25 VITALS — BP 116/56 | HR 72

## 2013-04-25 DIAGNOSIS — Z7901 Long term (current) use of anticoagulants: Secondary | ICD-10-CM

## 2013-04-25 DIAGNOSIS — I4891 Unspecified atrial fibrillation: Secondary | ICD-10-CM

## 2013-04-25 LAB — POCT INR: INR: 2.2

## 2013-05-09 ENCOUNTER — Encounter: Payer: Self-pay | Admitting: *Deleted

## 2013-05-14 HISTORY — PX: PACEMAKER REMOVAL: SHX5066

## 2013-05-20 ENCOUNTER — Other Ambulatory Visit: Payer: Self-pay | Admitting: *Deleted

## 2013-05-20 MED ORDER — CAPTOPRIL 25 MG PO TABS: 12.5000 mg | ORAL_TABLET | Freq: Two times a day (BID) | ORAL | Status: DC

## 2013-05-21 ENCOUNTER — Encounter: Payer: Self-pay | Admitting: Cardiology

## 2013-05-21 ENCOUNTER — Encounter (HOSPITAL_COMMUNITY): Payer: Self-pay | Admitting: General Practice

## 2013-05-21 ENCOUNTER — Inpatient Hospital Stay (HOSPITAL_COMMUNITY)
Admission: AD | Admit: 2013-05-21 | Discharge: 2013-05-22 | DRG: 315 | Disposition: A | Discharge status: Home / Self Care | Payer: Medicare Other | Source: Ambulatory Visit | Attending: Cardiology | Admitting: Cardiology

## 2013-05-21 ENCOUNTER — Ambulatory Visit (INDEPENDENT_AMBULATORY_CARE_PROVIDER_SITE_OTHER): Payer: Medicare Other | Admitting: Cardiology

## 2013-05-21 ENCOUNTER — Inpatient Hospital Stay (HOSPITAL_COMMUNITY): Payer: Medicare Other

## 2013-05-21 VITALS — BP 112/60 | Ht 71.0 in | Wt 171.1 lb

## 2013-05-21 DIAGNOSIS — Z862 Personal history of diseases of the blood and blood-forming organs and certain disorders involving the immune mechanism: Secondary | ICD-10-CM

## 2013-05-21 DIAGNOSIS — T827XXA Infection and inflammatory reaction due to other cardiac and vascular devices, implants and grafts, initial encounter: Principal | ICD-10-CM | POA: Diagnosis present

## 2013-05-21 DIAGNOSIS — I4891 Unspecified atrial fibrillation: Secondary | ICD-10-CM | POA: Diagnosis present

## 2013-05-21 DIAGNOSIS — J449 Chronic obstructive pulmonary disease, unspecified: Secondary | ICD-10-CM

## 2013-05-21 DIAGNOSIS — I480 Paroxysmal atrial fibrillation: Secondary | ICD-10-CM

## 2013-05-21 DIAGNOSIS — J4489 Other specified chronic obstructive pulmonary disease: Secondary | ICD-10-CM | POA: Diagnosis present

## 2013-05-21 DIAGNOSIS — Z95810 Presence of automatic (implantable) cardiac defibrillator: Secondary | ICD-10-CM

## 2013-05-21 DIAGNOSIS — Z8679 Personal history of other diseases of the circulatory system: Secondary | ICD-10-CM

## 2013-05-21 DIAGNOSIS — Z8639 Personal history of other endocrine, nutritional and metabolic disease: Secondary | ICD-10-CM

## 2013-05-21 DIAGNOSIS — Z7901 Long term (current) use of anticoagulants: Secondary | ICD-10-CM

## 2013-05-21 DIAGNOSIS — Z951 Presence of aortocoronary bypass graft: Secondary | ICD-10-CM

## 2013-05-21 DIAGNOSIS — T829XXA Unspecified complication of cardiac and vascular prosthetic device, implant and graft, initial encounter: Secondary | ICD-10-CM

## 2013-05-21 DIAGNOSIS — Y831 Surgical operation with implant of artificial internal device as the cause of abnormal reaction of the patient, or of later complication, without mention of misadventure at the time of the procedure: Secondary | ICD-10-CM | POA: Diagnosis present

## 2013-05-21 DIAGNOSIS — Z77090 Contact with and (suspected) exposure to asbestos: Secondary | ICD-10-CM

## 2013-05-21 DIAGNOSIS — R9430 Abnormal result of cardiovascular function study, unspecified: Secondary | ICD-10-CM

## 2013-05-21 DIAGNOSIS — I2589 Other forms of chronic ischemic heart disease: Secondary | ICD-10-CM | POA: Diagnosis present

## 2013-05-21 DIAGNOSIS — I509 Heart failure, unspecified: Secondary | ICD-10-CM | POA: Diagnosis present

## 2013-05-21 DIAGNOSIS — I5022 Chronic systolic (congestive) heart failure: Secondary | ICD-10-CM | POA: Diagnosis present

## 2013-05-21 DIAGNOSIS — I2510 Atherosclerotic heart disease of native coronary artery without angina pectoris: Secondary | ICD-10-CM

## 2013-05-21 HISTORY — DX: Atherosclerotic heart disease of native coronary artery without angina pectoris: I25.10

## 2013-05-21 HISTORY — DX: Acute myocardial infarction, unspecified: I21.9

## 2013-05-21 HISTORY — DX: Essential (primary) hypertension: I10

## 2013-05-21 HISTORY — DX: Unspecified osteoarthritis, unspecified site: M19.90

## 2013-05-21 HISTORY — DX: Presence of cardiac pacemaker: Z95.0

## 2013-05-21 LAB — COMPREHENSIVE METABOLIC PANEL
ALT: 26 U/L (ref 0–53)
AST: 36 U/L (ref 0–37)
Albumin: 3.8 g/dL (ref 3.5–5.2)
Alkaline Phosphatase: 113 U/L (ref 39–117)
BUN: 28 mg/dL — ABNORMAL HIGH (ref 6–23)
CO2: 29 mEq/L (ref 19–32)
Calcium: 9 mg/dL (ref 8.4–10.5)
Chloride: 101 mEq/L (ref 96–112)
Creatinine, Ser: 1.06 mg/dL (ref 0.50–1.35)
GFR calc Af Amer: 72 mL/min — ABNORMAL LOW (ref >90)
GFR calc non Af Amer: 62 mL/min — ABNORMAL LOW (ref >90)
Glucose, Bld: 170 mg/dL — ABNORMAL HIGH (ref 70–99)
Potassium: 4.1 mEq/L (ref 3.5–5.1)
Sodium: 138 mEq/L (ref 135–145)
Total Bilirubin: 0.8 mg/dL (ref 0.3–1.2)
Total Protein: 7.2 g/dL (ref 6.0–8.3)

## 2013-05-21 LAB — CBC WITH DIFFERENTIAL/PLATELET
Basophils Absolute: 0 10*3/uL (ref 0.0–0.1)
Basophils Relative: 0 % (ref 0–1)
Eosinophils Absolute: 0.1 10*3/uL (ref 0.0–0.7)
Eosinophils Relative: 2 % (ref 0–5)
HCT: 38.1 % — ABNORMAL LOW (ref 39.0–52.0)
Hemoglobin: 12.6 g/dL — ABNORMAL LOW (ref 13.0–17.0)
Lymphocytes Relative: 20 % (ref 12–46)
Lymphs Abs: 0.8 10*3/uL (ref 0.7–4.0)
MCH: 29.3 pg (ref 26.0–34.0)
MCHC: 33.1 g/dL (ref 30.0–36.0)
MCV: 88.6 fL (ref 78.0–100.0)
Monocytes Absolute: 0.3 10*3/uL (ref 0.1–1.0)
Monocytes Relative: 8 % (ref 3–12)
Neutro Abs: 2.6 10*3/uL (ref 1.7–7.7)
Neutrophils Relative %: 70 % (ref 43–77)
Platelets: 108 10*3/uL — ABNORMAL LOW (ref 150–400)
RBC: 4.3 MIL/uL (ref 4.22–5.81)
RDW: 16.2 % — ABNORMAL HIGH (ref 11.5–15.5)
WBC: 3.7 10*3/uL — ABNORMAL LOW (ref 4.0–10.5)

## 2013-05-21 LAB — URINALYSIS, ROUTINE W REFLEX MICROSCOPIC
Bilirubin Urine: NEGATIVE
Glucose, UA: NEGATIVE mg/dL
Hgb urine dipstick: NEGATIVE
Ketones, ur: NEGATIVE mg/dL
Leukocytes, UA: NEGATIVE
Nitrite: NEGATIVE
Protein, ur: NEGATIVE mg/dL
Specific Gravity, Urine: 1.019 (ref 1.005–1.030)
Urobilinogen, UA: 0.2 mg/dL (ref 0.0–1.0)
pH: 5.5 (ref 5.0–8.0)

## 2013-05-21 LAB — PROTIME-INR
INR: 1.62 — ABNORMAL HIGH (ref 0.00–1.49)
Prothrombin Time: 18.8 seconds — ABNORMAL HIGH (ref 11.6–15.2)

## 2013-05-21 MED ORDER — OMEGA-3 FATTY ACIDS 1000 MG PO CAPS: 2.0000 g | ORAL_CAPSULE | Freq: Every day | ORAL | Status: DC

## 2013-05-21 MED ORDER — DORZOLAMIDE HCL 2 % OP SOLN
1.0000 [drp] | Freq: Two times a day (BID) | OPHTHALMIC | Status: DC
  Administered 2013-05-21 – 2013-05-22 (×2): 1 [drp] via OPHTHALMIC
  Filled 2013-05-21: qty 10

## 2013-05-21 MED ORDER — DIGOXIN 125 MCG PO TABS
125.0000 ug | ORAL_TABLET | Freq: Every day | ORAL | Status: DC
  Administered 2013-05-22: 125 ug via ORAL
  Filled 2013-05-21: qty 1

## 2013-05-21 MED ORDER — FUROSEMIDE 80 MG PO TABS
80.0000 mg | ORAL_TABLET | Freq: Every day | ORAL | Status: DC
  Administered 2013-05-22: 80 mg via ORAL
  Filled 2013-05-21: qty 1

## 2013-05-21 MED ORDER — MAGNESIUM OXIDE 400 (241.3 MG) MG PO TABS
400.0000 mg | ORAL_TABLET | Freq: Every day | ORAL | Status: DC
  Administered 2013-05-22: 400 mg via ORAL
  Filled 2013-05-21: qty 1

## 2013-05-21 MED ORDER — WARFARIN - PHARMACIST DOSING INPATIENT: Freq: Every day | Status: DC

## 2013-05-21 MED ORDER — ALBUTEROL SULFATE HFA 108 (90 BASE) MCG/ACT IN AERS: 2.0000 | INHALATION_SPRAY | Freq: Four times a day (QID) | RESPIRATORY_TRACT | Status: DC | PRN

## 2013-05-21 MED ORDER — ALPRAZOLAM 0.25 MG PO TABS: 0.2500 mg | ORAL_TABLET | Freq: Three times a day (TID) | ORAL | Status: DC | PRN

## 2013-05-21 MED ORDER — CAPTOPRIL 12.5 MG PO TABS
12.5000 mg | ORAL_TABLET | Freq: Two times a day (BID) | ORAL | Status: DC
  Administered 2013-05-21 – 2013-05-22 (×2): 12.5 mg via ORAL
  Filled 2013-05-21 (×3): qty 1

## 2013-05-21 MED ORDER — BUDESONIDE-FORMOTEROL FUMARATE 160-4.5 MCG/ACT IN AERO
1.0000 | INHALATION_SPRAY | Freq: Two times a day (BID) | RESPIRATORY_TRACT | Status: DC
  Administered 2013-05-21 – 2013-05-22 (×2): 1 via RESPIRATORY_TRACT
  Filled 2013-05-21: qty 6

## 2013-05-21 MED ORDER — TIMOLOL MALEATE 0.5 % OP SOLN
1.0000 [drp] | Freq: Two times a day (BID) | OPHTHALMIC | Status: DC
  Administered 2013-05-22: 1 [drp] via OPHTHALMIC
  Filled 2013-05-21: qty 5

## 2013-05-21 MED ORDER — LEVOTHYROXINE SODIUM 125 MCG PO TABS
125.0000 ug | ORAL_TABLET | Freq: Every day | ORAL | Status: DC
  Administered 2013-05-22: 125 ug via ORAL
  Filled 2013-05-21 (×2): qty 1

## 2013-05-21 MED ORDER — BIMATOPROST 0.01 % OP SOLN
1.0000 [drp] | Freq: Every day | OPHTHALMIC | Status: DC
  Administered 2013-05-21: 1 [drp] via OPHTHALMIC
  Filled 2013-05-21: qty 2.5

## 2013-05-21 MED ORDER — BIMATOPROST 0.03 % OP SOLN
1.0000 [drp] | Freq: Every day | OPHTHALMIC | Status: DC
  Filled 2013-05-21: qty 2.5

## 2013-05-21 MED ORDER — BRIMONIDINE TARTRATE 0.2 % OP SOLN
1.0000 [drp] | Freq: Two times a day (BID) | OPHTHALMIC | Status: DC
  Administered 2013-05-21 – 2013-05-22 (×2): 1 [drp] via OPHTHALMIC
  Filled 2013-05-21: qty 5

## 2013-05-21 MED ORDER — OMEGA-3-ACID ETHYL ESTERS 1 G PO CAPS
2.0000 g | ORAL_CAPSULE | Freq: Every day | ORAL | Status: DC
  Administered 2013-05-22: 2 g via ORAL
  Filled 2013-05-21: qty 2

## 2013-05-21 MED ORDER — FA-PYRIDOXINE-CYANOCOBALAMIN 2.5-25-2 MG PO TABS
1.0000 | ORAL_TABLET | Freq: Every day | ORAL | Status: DC
  Administered 2013-05-22: 1 via ORAL
  Filled 2013-05-21: qty 1

## 2013-05-21 MED ORDER — MAGNESIUM OXIDE 400 MG PO TABS: 400.0000 mg | ORAL_TABLET | Freq: Every day | ORAL | Status: DC

## 2013-05-21 MED ORDER — WARFARIN SODIUM 5 MG PO TABS
5.0000 mg | ORAL_TABLET | Freq: Once | ORAL | Status: DC
  Filled 2013-05-21: qty 1

## 2013-05-21 MED ORDER — COQ10 100 MG PO CAPS: 100.0000 mg | ORAL_CAPSULE | Freq: Every day | ORAL | Status: DC

## 2013-05-21 MED ORDER — BRIMONIDINE TARTRATE-TIMOLOL 0.2-0.5 % OP SOLN: 1.0000 [drp] | Freq: Two times a day (BID) | OPHTHALMIC | Status: DC

## 2013-05-21 MED ORDER — TRAMADOL HCL 50 MG PO TABS: 50.0000 mg | ORAL_TABLET | Freq: Four times a day (QID) | ORAL | Status: DC | PRN

## 2013-05-21 MED ORDER — POTASSIUM CHLORIDE ER 10 MEQ PO TBCR
10.0000 meq | EXTENDED_RELEASE_TABLET | Freq: Every day | ORAL | Status: DC
  Administered 2013-05-22: 10 meq via ORAL
  Filled 2013-05-21: qty 1

## 2013-05-21 MED ORDER — VITAMIN E 180 MG (400 UNIT) PO CAPS
400.0000 [IU] | ORAL_CAPSULE | Freq: Every day | ORAL | Status: DC
  Administered 2013-05-22: 400 [IU] via ORAL
  Filled 2013-05-21: qty 1

## 2013-05-21 MED ORDER — ACETAMINOPHEN 325 MG PO TABS: 650.0000 mg | ORAL_TABLET | ORAL | Status: DC | PRN

## 2013-05-21 MED ORDER — ZOLPIDEM TARTRATE 5 MG PO TABS: 5.0000 mg | ORAL_TABLET | Freq: Every evening | ORAL | Status: DC | PRN

## 2013-05-21 NOTE — Assessment & Plan Note (Signed)
Controlled.  

## 2013-05-21 NOTE — Assessment & Plan Note (Signed)
Stable

## 2013-05-21 NOTE — Assessment & Plan Note (Signed)
Pacer site erosion, appears to be sterile

## 2013-05-21 NOTE — H&P (Signed)
Patient ID: [name] MRN: [id number], DOB/AGE: September 11, 1926   Admit date: 05/21/13   Primary Physician: Margaree Mackintosh, MD Primary Cardiologist: Dr Royann Shivers  HPI:  [name] is an 79y/o pt followed by our group with a history of CAD and ICM. He had CABG X 4 in '95. He had a pacemaker placed in 1999 after he had a syncopal spell while in a deer stand. He had EOL Gen change in 2006. Cath in 2009 revealed patent grafts but severe LVD. He had a BiV ICD placed in 2009 by Dr Ladona Ridgel. Dr Royann Shivers notes he has PAF and is pacer dependent. He had a device change 12/13  by Dr Royann Shivers.  Dr [name]'s last note mentions concern for the site, this was 01/09/13. He presents to the office today with complaints of an open area on the lateral aspect of his device. There is no drainage. He denies fever or chills. The site was inspected by Dr Alanda Amass and myself. He is admitted now for further evaluation by EP service. As there is no sign of obvious infection will hold off on ABs for now.   Problem List: Past Medical History  Diagnosis Date  . Ischemic cardiomyopathy     St. Jude BiV ICD  . Paroxysmal atrial fibrillation     sinus rhythm on amiodarone  . COPD (chronic obstructive pulmonary disease)   . Asbestos exposure     plaques on CT  . Compression fracture     T7    Past Surgical History  Procedure Laterality Date  . Coronary artery bypass graft  11/01/1994    SVG to first diagonal, to distal RCA, to the ramus intermedius artery, and LIMA the LAD  . Pacemaker insertion    . Inguinal hernia repair       Allergies: No Known Allergies   Home Medications No current facility-administered medications for this encounter.   Current Outpatient Prescriptions  Medication Sig Dispense Refill  . albuterol (VENTOLIN HFA) 108 (90 BASE) MCG/ACT inhaler Inhale 2 puffs into the lungs every 6 (six) hours as needed.        . bimatoprost (LUMIGAN) 0.03 % ophthalmic solution Place 1 drop into both eyes at  bedtime.       . budesonide-formoterol (SYMBICORT) 160-4.5 MCG/ACT inhaler Inhale 1 puff into the lungs 2 (two) times daily.      . captopril (CAPOTEN) 25 MG tablet Take 0.5 tablets (12.5 mg total) by mouth 2 (two) times daily.  90 tablet  2  . Coenzyme Q10 (COQ10) 100 MG CAPS Take 100 mg by mouth daily.       . COMBIGAN 0.2-0.5 % ophthalmic solution Place 1 drop into both eyes Twice daily.      . digoxin (LANOXIN) 0.125 MG tablet Take 125 mcg by mouth daily.        . dorzolamide (TRUSOPT) 2 % ophthalmic solution Place 1 drop into the left eye Twice daily.      . fish oil-omega-3 fatty acids 1000 MG capsule Take 2 g by mouth daily.      . folic acid-pyridoxine-cyancobalamin (FOLTX) 2.5-25-2 MG TABS Take 1 tablet by mouth daily.      . furosemide (LASIX) 80 MG tablet Take 80 mg by mouth daily.        Marland Kitchen KLOR-CON 10 10 MEQ CR tablet Take 10 mEq by mouth daily.       . magnesium oxide (MAG-OX) 400 MG tablet Take 400 mg by mouth daily.      Marland Kitchen  SYNTHROID 125 MCG tablet TAKE 1 TABLET (125 MCG TOTAL) BY MOUTH DAILY.  90 tablet  1  . vitamin E 400 UNIT capsule Take 400 Units by mouth daily.        Marland Kitchen warfarin (COUMADIN) 5 MG tablet Take 2.5-5 mg by mouth daily. Alternating between a half and a whole tablet. Starting on 10/21/12 2.5mg , 5mg , 2.5mg , 5mg ...         Family History  Problem Relation Age of Onset  . Heart disease Father      History   Social History  . Marital Status: Widowed    Spouse Name: N/A    Number of Children: N/A  . 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 Smoking status: Never Smoker   . Smokeless tobacco: Never Used  . Alcohol Use: 0.6 oz/week    1 Glasses of wine per week  . Drug Use: Not on file  . Sexually Active: Not on file   Other Topics Concern  . Not on file   Social History Narrative  . No narrative on file     Review of Systems: General: negative for chills, fever,  night sweats or weight changes.  Cardiovascular: negative for chest pain, dyspnea on exertion, edema, orthopnea, palpitations, paroxysmal nocturnal dyspnea or shortness of breath Dermatological: negative for rash Respiratory: negative for cough or wheezing Urologic: negative for hematuria Abdominal: negative for nausea, vomiting, diarrhea, bright red blood per rectum, melena, or hematemesis Neurologic: negative for visual changes, syncope, or dizziness All other systems reviewed and are otherwise negative except as noted above.  Physical Exam: There were no vitals taken for this visit.  General appearance: alert, cooperative, appears stated age and no distress Neck: no carotid bruit and no JVD Lungs: clear to auscultation bilaterally and Device site on Lt chest has a small area of bare metal showing at the lateral wound edge Heart: regular rate and rhythm Abdomen: soft, non-tender; bowel sounds normal; no masses,  no organomegaly, ventral hernia noted Extremities: extremities normal, atraumatic, no cyanosis or edema Pulses: 2+ and symmetric Skin: pale Neurologic: Grossly normal    Labs:  No results found for this or any previous visit (from the past 24 hour(s)).   Radiology/Studies: No results found.   ASSESSMENT AND PLAN:    Sterile Device erosion CAD- CABG X 4 '99 COPD PAF Coumadin Rx    PLAN:  Admit,  EP consult. No antibiotics at this point until seen by EP.   Deland Pretty, PA-C 05/21/2013, 4:44 PM

## 2013-05-21 NOTE — Progress Notes (Signed)
ANTICOAGULATION CONSULT NOTE - Initial Consult  Pharmacy Consult for Coumadin Indication: atrial fibrillation  No Known Allergies  Patient Measurements: Height: 5\' 11"  (180.3 cm) Weight: 168 lb 10.4 oz (76.5 kg) IBW/kg (Calculated) : 75.3  Vital Signs: Temp: 97.6 F (36.4 C) (07/08 2117) Temp src: Oral (07/08 2117) BP: 131/65 mmHg (07/08 2117) Pulse Rate: 72 (07/08 2117)  Labs:  Recent Labs  05/21/13 2034  HGB 12.6*  HCT 38.1*  PLT PENDING  LABPROT 18.8*  INR 1.62*  CREATININE 1.06    Estimated Creatinine Clearance: 54.3 ml/min (by C-G formula based on Cr of 1.06).   Medical History: Past Medical History  Diagnosis Date  . Ischemic cardiomyopathy     St. Jude BiV ICD  . Paroxysmal atrial fibrillation     sinus rhythm on amiodarone  . COPD (chronic obstructive pulmonary disease)   . Asbestos exposure     plaques on CT  . Compression fracture     T7   Assessment:  Admitted today with open area on lateral aspect of BiV ICD.  Evaluated by PA and MD. No drainage noted, no signs of infection, no antibiotics for now.  On Coumadin for atrial fibrillation.    Home Coumadin regimen:  5 mg MWF, 2.5 mg TTSS. Last dose taken at home on 7/7.   INR is subtherapeutic tonight.  Goal of Therapy:  INR 2-3 Monitor platelets by anticoagulation protocol: Yes   Plan:   Coumadin to be held tonight.  RN just took telephone order from on call MD (Dr. Jon Billings).  ICD site may need to be explored.  Daily PT/INR ordered.  Will follow up on 7/9.  Dennie Fetters, [place] Pager: [phone] 05/21/2013,9:25 PM

## 2013-05-21 NOTE — Assessment & Plan Note (Signed)
Chronic Coumadin 

## 2013-05-21 NOTE — Assessment & Plan Note (Signed)
PTVDP '99, gen Change '06, BiV ICD '09 (st Jude), gen change 12/13

## 2013-05-22 ENCOUNTER — Encounter (HOSPITAL_COMMUNITY): Payer: Self-pay | Admitting: Nurse Practitioner

## 2013-05-22 DIAGNOSIS — R0989 Other specified symptoms and signs involving the circulatory and respiratory systems: Secondary | ICD-10-CM

## 2013-05-22 DIAGNOSIS — Z8639 Personal history of other endocrine, nutritional and metabolic disease: Secondary | ICD-10-CM

## 2013-05-22 DIAGNOSIS — Z862 Personal history of diseases of the blood and blood-forming organs and certain disorders involving the immune mechanism: Secondary | ICD-10-CM

## 2013-05-22 DIAGNOSIS — Z8679 Personal history of other diseases of the circulatory system: Secondary | ICD-10-CM

## 2013-05-22 DIAGNOSIS — T827XXA Infection and inflammatory reaction due to other cardiac and vascular devices, implants and grafts, initial encounter: Principal | ICD-10-CM

## 2013-05-22 DIAGNOSIS — T82897A Other specified complication of cardiac prosthetic devices, implants and grafts, initial encounter: Secondary | ICD-10-CM

## 2013-05-22 LAB — PROTIME-INR: INR: 1.66 — ABNORMAL HIGH (ref 0.00–1.49)

## 2013-05-22 MED ORDER — WARFARIN SODIUM 5 MG PO TABS
5.0000 mg | ORAL_TABLET | Freq: Once | ORAL | Status: AC
  Administered 2013-05-22: 5 mg via ORAL
  Filled 2013-05-22: qty 1

## 2013-05-22 MED ORDER — CEPHALEXIN 500 MG PO CAPS: 500.0000 mg | ORAL_CAPSULE | Freq: Two times a day (BID) | ORAL | Status: DC

## 2013-05-22 NOTE — Progress Notes (Signed)
Reviewed discharge instructions with pt and daughter, questions answered, verbalized understanding. Dc home with dc information and belongings Georgette Dover

## 2013-05-22 NOTE — Consult Note (Signed)
Reason for Consult: ICD pocket erosion  Referring Physician: Dr. Ivan Croft is an 79 y.o. male.   HPI: The patient is a very pleasant and functional 79 yo man with a h/o an ICM, chronic systolic CHF, CHB, s/p multiple device implants including BiV ICD, chronic atrial fibrillation who presented with his ICD open at the skin, exposing the device. The patient denies fevers/chills/night sweats/weight loss or malaise. No anorexia. He has not had tenderness at the site. He did have some drainage at the site.   PMH: Past Medical History  Diagnosis Date  . Ischemic cardiomyopathy     St. Jude BiV ICD  . Paroxysmal atrial fibrillation     sinus rhythm on amiodarone  . Asbestos exposure     plaques on CT  . Compression fracture     T7  . Pacemaker   . Hypertension   . Coronary artery disease   . Arthritis     "little, in my hands" (05/21/2013)  . COPD (chronic obstructive pulmonary disease)     pt denies this hx on 05/21/2013    PSHX: Past Surgical History  Procedure Laterality Date  . Inguinal hernia repair Right   . Coronary angioplasty      "several through the years; 3 or 4 or 5" (05/21/2013)  . Coronary angioplasty with stent placement      "I have 1 stent" (05/21/2013)  . Coronary artery bypass graft  11/01/1994    SVG to first diagonal, to distal RCA, to the ramus intermedius artery, and LIMA the LAD  . Insert / replace / remove pacemaker  ?1999  . Cataract extraction w/ intraocular lens  implant, bilateral Bilateral     FAMHX: Family History  Problem Relation Age of Onset  . Heart disease Father     Social History:  reports that he has never smoked. He has never used smokeless tobacco. He reports that he drinks about 8.4 ounces of alcohol per week. He reports that he does not use illicit drugs.  Allergies: No Known Allergies  Medications: reviewed  Dg Chest 2 View  05/21/2013   *RADIOLOGY REPORT*  Clinical Data: Pacemaker placement  CHEST - 2 VIEW   Comparison: 10/22/2012  Findings: Cardiomegaly again noted.  Four leads cardiac pacemaker is unchanged in position.  No acute infiltrate or pleural effusion. No pulmonary edema.  Bony thorax is stable.  IMPRESSION: No active disease.  Stable four leads cardiac pacemaker position.   Original Report Authenticated By: Natasha Mead, M.D.    ROS  As stated in the HPI and negative for all other systems.  Physical Exam  Vitals:Blood pressure 130/65, pulse 73, temperature 97.6 F (36.4 C), temperature source Oral, resp. rate 18, height 5\' 11"  (1.803 m), weight 168 lb 10.4 oz (76.5 kg), SpO2 97.00%.  Well appearing elderly man, NAD HEENT: Unremarkable Neck:  7 cm JVD, no thyromegally Lungs:  Clear with no wheezes; ICD insertion site demonstrates direct visualization of the device, no erythema HEART:  Regular rate rhythm, no murmurs, no rubs, no clicks Abd:  Flat, positive bowel sounds, no organomegally, no rebound, no guarding Ext:  2 plus pulses, no edema, no cyanosis, no clubbing Skin:  No rashes no nodules Neuro:  CN II through XII intact, motor grossly intact  CXR - demonstrates an atrial lead, 2 RV pacing lead, an ICD lead and an LV lead  Assessment/Plan: 1.  ICD pocket infection with drainage 2. CHB 3. ICM 4. Chronic systolic CHF 5. Atrial  fibrillation Rec: This is a difficult problem. The pocket likely has a Staph Epi infection as the patient does not appear systemically ill. His system will need extraction as there is no way to heal pocket without total removal of entire device. As he is pacemaker dependent, he will require a temporary perm pacemaker for several weeks prior to re-implantation of a new device. I suspect a BiV PPM would be more appropriate in the future but will discuss this once his device is out.   Tawney Vanorman,M.D.  Sharlot Gowda TaylorMD 05/22/2013, 10:58 AM

## 2013-05-22 NOTE — Discharge Summary (Signed)
Physician Discharge Summary  Patient ID: [name] MRN: [id number] DOB/AGE: 04-17-1926 79 y.o.  Admit date: 05/21/2013 Discharge date: 05/22/2013  Admission Diagnoses: AICD Pocket Erosion  Discharge Diagnoses:  Active Problems:   AICD Pocket Erosion   AICD (automatic cardioverter/defibrillator) present   ICM- lat EF 35-40% Nov 2011   Discharged Condition: stable  Hospital Course: The patient is a 79 y/o male, followed at [facility] by Dr. Royann Shivers, with a biventricular pacemaker defibrillator, who inially presented to [facility] on 05/21/13 with a complaint of an open area on the lateral aspect of his device. He was seen and examined by Clarene Duke, PA-C and Dr. Susa Griffins. The patient had denied fever and chills. He did however have mild drainage from the site, but no erythema or tenderness. He was admitted directly to [facility] for further evaluation. He was afebrile and no Leukocytosis. Electrophysiology was consulted. He was seen and examined by Dr. Lewayne Bunting, who felt that the site was likely infected with Staph Epi, since he was not systemically ill. It was felt that in order for the pocket to heal, the entire device wold have to be extracted. As he is pacemaker dependent, he will require a temporary pacemaker for several weeks prior to re-implantation of a new device. Dr. Ladona Ridgel discussed the nature of the situation in detail with the patient and his family. It was decided to have the patient return, at a later day for the device extraction. He was last seen and examined by Dr. Ladona Ridgel and Dr. Tresa Endo, who felt that he was stable for discharge home. He was ordered to take a course of oral antibiotics. 500 mg of Kelfelx, BID x 10 days was prescribed. The patient was also ordered to temporarily discontinue he coumadin, until after the extraction. The patient verbalized understanding. He will return [facility] on 05/27/13 to undergo device extraction, to be performed by Dr. Ladona Ridgel.   Consults:  Electrophysiology  Significant Diagnostic Studies: none  Treatments: See Hospital Course  Discharge Exam: Blood pressure 127/69, pulse 69, temperature 97.7 F (36.5 C), temperature source Oral, resp. rate 18, height 5\' 11"  (1.803 m), weight 168 lb 10.4 oz (76.5 kg), SpO2 98.00%.   Disposition: 01-Home or Self Care       Future Appointments Provider Department Dept Phone   06/06/2013 10:00 AM Phillips Hay, RPH-CPP [facility] [place] [phone]   06/07/2013 9:00 AM Margaree Mackintosh, MD Sharlet Salina, MD [phone]   06/10/2013 10:00 AM Margaree Mackintosh, MD Sharlet Salina, MD [phone]       Medication List    STOP taking these medications       warfarin 5 MG tablet  Commonly known as:  COUMADIN      TAKE these medications       albuterol 108 (90 BASE) MCG/ACT inhaler  Commonly known as:  PROVENTIL HFA;VENTOLIN HFA  Inhale 2 puffs into the lungs every 6 (six) hours as needed for wheezing or shortness of breath.     bimatoprost 0.03 % ophthalmic solution  Commonly known as:  LUMIGAN  Place 1 drop into both eyes at bedtime.     budesonide-formoterol 160-4.5 MCG/ACT inhaler  Commonly known as:  SYMBICORT  Inhale 1 puff into the lungs 2 (two) times daily.     captopril 25 MG tablet  Commonly known as:  CAPOTEN  Take 0.5 tablets (12.5 mg total) by mouth 2 (two) times daily.     COMBIGAN 0.2-0.5 % ophthalmic solution  Generic drug:  brimonidine-timolol  Place 1 drop into both eyes Twice daily.     CoQ10 100 MG Caps  Take 100 mg by mouth daily.     digoxin 0.125 MG tablet  Commonly known as:  LANOXIN  Take 125 mcg by mouth daily.     dorzolamide 2 % ophthalmic solution  Commonly known as:  TRUSOPT  Place 1 drop into the left eye Twice daily.     fish oil-omega-3 fatty acids 1000 MG capsule  Take 2 g by mouth daily.     folic acid-pyridoxine-cyancobalamin 2.5-25-2 MG Tabs  Commonly known as:  FOLTX  Take 1 tablet by mouth  daily.     furosemide 80 MG tablet  Commonly known as:  LASIX  Take 80 mg by mouth daily.     KLOR-CON 10 10 MEQ tablet  Generic drug:  potassium chloride  Take 10 mEq by mouth daily.     levothyroxine 125 MCG tablet  Commonly known as:  SYNTHROID, LEVOTHROID  Take 125 mcg by mouth daily before breakfast.     magnesium oxide 400 MG tablet  Commonly known as:  MAG-OX  Take 400 mg by mouth daily.     vitamin E 400 UNIT capsule  Take 400 Units by mouth daily.       Follow-up Information   Follow up with Lewayne Bunting, MD. (Dr.Taylor's office will call you with day and time for pacemaker removal)    Contact information:   [address] [place] [address] [phone]      TIME SPENT ON DISCHARGE, INCLUDING PHYSICIAN TIME: >30 MINUTES  Signed: Allayne Butcher, PA-C 05/24/2013, 1:36 PM

## 2013-05-22 NOTE — Progress Notes (Signed)
[facility]  Subjective: No fevers, chills, no drainage or pain near device site.   Objective: Vital signs in last 24 hours: Temp:  [97.6 F (36.4 C)] 97.6 F (36.4 C) (07/09 0506) Pulse Rate:  [70-72] 70 (07/09 0506) Resp:  [18] 18 (07/09 0506) BP: (112-134)/(60-70) 127/69 mmHg (07/09 0506) SpO2:  [97 %-100 %] 97 % (07/09 0756) Weight:  [168 lb 10.4 oz (76.5 kg)-171 lb 1.6 oz (77.61 kg)] 168 lb 10.4 oz (76.5 kg) (07/08 1745) Last BM Date: 05/20/13  Intake/Output from previous day: 07/08 0701 - 07/09 0700 In: 240 [P.O.:240] Out: -  Intake/Output this shift:    Medications Current Facility-Administered Medications  Medication Dose Route Frequency Provider Last Rate Last Dose  . acetaminophen (TYLENOL) tablet 650 mg  650 mg Oral Q4H PRN Luke K Kilroy, PA-C      . albuterol (PROVENTIL HFA;VENTOLIN HFA) 108 (90 BASE) MCG/ACT inhaler 2 puff  2 puff Inhalation Q6H PRN [name], PA-C      . ALPRAZolam Prudy Feeler) tablet 0.25 mg  0.25 mg Oral TID PRN [name], PA-C      . bimatoprost (LUMIGAN) 0.01 % ophthalmic solution 1 drop  1 drop Both Eyes QHS Marykay Lex, MD   1 drop at 05/21/13 2130  . brimonidine (ALPHAGAN) 0.2 % ophthalmic solution 1 drop  1 drop Both Eyes BID Marykay Lex, MD   1 drop at 05/21/13 2130   And  . timolol (TIMOPTIC) 0.5 % ophthalmic solution 1 drop  1 drop Both Eyes BID Marykay Lex, MD      . budesonide-formoterol [facility]) 160-4.5 MCG/ACT inhaler 1 puff  1 puff Inhalation BID [name], PA-C   1 puff at 05/22/13 0755  . captopril (CAPOTEN) tablet 12.5 mg  12.5 mg Oral BID [name], PA-C   12.5 mg at 05/21/13 2132  . digoxin (LANOXIN) tablet 125 mcg  125 mcg Oral Daily Luke K Kilroy, PA-C      . dorzolamide (TRUSOPT) 2 % ophthalmic solution 1 drop  1 drop Left Eye BID Eda Paschal [place], PA-C   1 drop at 05/21/13 2130  . folic acid-pyridoxine-cyancobalamin (FOLTX) 2.5-25-2 MG per tablet 1 tablet  1 tablet Oral  Daily Luke K Kilroy, PA-C      . furosemide (LASIX) tablet 80 mg  80 mg Oral Daily Luke K Kilroy, PA-C      . levothyroxine (SYNTHROID, LEVOTHROID) tablet 125 mcg  125 mcg Oral QAC breakfast [name], PA-C   125 mcg at 05/22/13 [phone]  . magnesium oxide (MAG-OX) tablet 400 mg  400 mg Oral Daily Marykay Lex, MD      . omega-3 acid ethyl esters (LOVAZA) capsule 2 g  2 g Oral Daily Marykay Lex, MD      . potassium chloride (K-DUR) CR tablet 10 mEq  10 mEq Oral Daily Luke K Kilroy, PA-C      . traMADol Janean Sark) tablet 50 mg  50 mg Oral Q6H PRN Eda Paschal Kilroy, PA-C      . vitamin E capsule 400 Units  400 Units Oral Daily [name], PA-C      . Warfarin - Pharmacist Dosing Inpatient   Does not apply q1800 Dennie Fetters, RPH      . zolpidem (AMBIEN) tablet 5 mg  5 mg Oral QHS PRN [name], PA-C        PE: General appearance: alert, cooperative  and no distress Lungs: clear to auscultation bilaterally Heart: regular rate and rhythm Extremities: no LEE Pulses: 2+ and symmetric Skin: warm and dry, Device site on Lt chest has a small area of bare metal showing at the lateral wound edge, no erythema, or drainage  Neurologic: Grossly normal  Lab Results:   Recent Labs  05/21/13 2034  WBC 3.7*  HGB 12.6*  HCT 38.1*  PLT 108*   BMET  Recent Labs  05/21/13 2034  NA 138  K 4.1  CL 101  CO2 29  GLUCOSE 170*  BUN 28*  CREATININE 1.06  CALCIUM 9.0   PT/INR  Recent Labs  05/21/13 2034 05/22/13 0455  LABPROT 18.8* 19.1*  INR 1.62* 1.66*    Assessment/Plan Device erosion  CAD- CABG X 4 '99  COPD  PAF  Coumadin Rx   Plan: Per patient and family members by bedside, he has already been evaluated by Dr. Ladona Ridgel (no written consult note yet). Apparently, Dr. Ladona Ridgel feels that the site is infected with staph. Plan is for device/lead removal and reinsertion to the right side. Target surgery date is next week. ? Discharging home today on antibiotics and have pt  return for procedure. Will need to discuss with Dr. Ladona Ridgel the definite plan.     LOS: 1 day    Brittainy M. Delmer Islam 05/22/2013 10:19 AM   Patient seen and examined. Agree with assessment and plan. Afebrile. Pacemaker site examined. For initiation of antiobiotic therapy today with ultimate plan for removal and re-implantation per Dr. Ladona Ridgel. Awaiting his final recommendations. Prob DC home later today.   Lennette Bihari, MD, [facility] 05/22/2013 10:35 AM

## 2013-05-22 NOTE — Progress Notes (Signed)
See H&P   Chandria Rookstool PA-C 05/22/2013 8:07 AM

## 2013-05-23 ENCOUNTER — Ambulatory Visit (INDEPENDENT_AMBULATORY_CARE_PROVIDER_SITE_OTHER): Payer: Medicare Other | Admitting: Internal Medicine

## 2013-05-23 ENCOUNTER — Telehealth: Payer: Self-pay | Admitting: *Deleted

## 2013-05-23 ENCOUNTER — Encounter: Payer: Self-pay | Admitting: Internal Medicine

## 2013-05-23 ENCOUNTER — Other Ambulatory Visit: Payer: Self-pay | Admitting: *Deleted

## 2013-05-23 VITALS — BP 114/60 | HR 74 | Ht 71.0 in | Wt 170.8 lb

## 2013-05-23 DIAGNOSIS — J61 Pneumoconiosis due to asbestos and other mineral fibers: Secondary | ICD-10-CM

## 2013-05-23 DIAGNOSIS — Z95810 Presence of automatic (implantable) cardiac defibrillator: Secondary | ICD-10-CM

## 2013-05-23 DIAGNOSIS — J449 Chronic obstructive pulmonary disease, unspecified: Secondary | ICD-10-CM

## 2013-05-23 MED ORDER — BUDESONIDE-FORMOTEROL FUMARATE 160-4.5 MCG/ACT IN AERO: INHALATION_SPRAY | RESPIRATORY_TRACT | Status: DC

## 2013-05-23 NOTE — Progress Notes (Signed)
Subjective:    Patient ID: [name], male    DOB: 1926-04-01, 79 y.o.   MRN: [id number]  HPI 05/24/11- COPD, Asbestosis, chronic granulomatous disease, hx AFib Last here - February 01, 2010 after pacemaker He has had a good year without major flare ups. He tried off Symbicort but resumed after 10 days because of increasing dyspnea, tightness and chest congestion. Still on amiodarone. Has not had major cardiac problems since last here.  Denies usual cough or wheeze. Ok around home activities, but deer hunting, will have to stop to rest while climbing a hill.   05/22/12-  86 yoM never smoker followed for COPD, Asbestosis, complicated by chronic granulomatous disease, hx AFib:  Pt states no complaints-breathing is good.  He finds one puff of Symbicort twice daily sufficient. Does not use a rescue inhaler. No edema on furosemide, with an occasional extra dose. Continues amiodarone with no recent cardiac event CXR 05/26/11 IMPRESSION:  Stable cardiomegaly and chronic pulmonary venous hypertension. No  acute findings.  Original Report Authenticated By: Danae Orleans, M.D.   05/23/13- 43 yoM never smoker followed for COPD, Asbestosis, complicated by  Hx chronic granulomatous disease, hx AFib follows for:  pt stated no complaints today.  Eroding a hole in the skin over his pacemaker, pending repair. He feels he depends on Symbicort, one puff daily, but does not need a rescue inhaler. PFT 2009- FEV1/FVC 0.67- mild obstructive airways disease mainly in small airways with little response to bronchodilator. CXR 05/21/13 IMPRESSION:  No active disease. Stable four leads cardiac pacemaker position.  Original Report Authenticated By: Natasha Mead, M.D.  ROS-see HPI Constitutional:   No-   weight loss, night sweats, fevers, chills, fatigue, lassitude. HEENT:   No-  headaches, difficulty swallowing, tooth/dental problems, sore throat,       No-  sneezing, itching, ear ache, nasal congestion, post nasal  drip,  CV:  No-   chest pain, orthopnea, PND, swelling in lower extremities, anasarca, dizziness, palpitations Resp: No-  acute shortness of breath with exertion or at rest.              No-   productive cough,  No non-productive cough,  No- coughing up of blood.              No-   change in color of mucus.  No- wheezing.   Skin: No-   rash or lesions. GI:  No-   heartburn, indigestion, abdominal pain, nausea, vomiting,  GU:  MS:  No-   joint pain or swelling.  Neuro-     nothing unusual Psych:  No- change in mood or affect. No depression or anxiety.  No memory loss.  OBJ- Physical Exam  General- Alert, Oriented, Affect-appropriate, Distress- none acute Skin- rash-none, lesions- none, excoriation- none Lymphadenopathy- none Head- atraumatic            Eyes- Gross vision intact, PERRLA, conjunctivae and secretions clear            Ears- Hearing, canals-normal            Nose- Clear, no-Septal dev, mucus, polyps, erosion, perforation             Throat- Mallampati II , mucosa clear , drainage- none, tonsils- atrophic Neck- flexible , trachea midline, no stridor , thyroid nl, carotid no bruit Chest - symmetrical excursion , unlabored           Heart/CV- RRR , no murmur , no gallop  , no rub,  nl s1, ? Increased P2                           - JVD- none , edema- none, stasis changes- none, varices- none           Lung- clear to P&A, unlabored, wheeze- none, cough- none , dullness-none, rub- none           Chest wall- Pacemaker L Abd-  Br/ Gen/ Rectal- Not done, not indicated Extrem- cyanosis- none, clubbing, none, atrophy- none, strength- nl Neuro- grossly intact to observation

## 2013-05-23 NOTE — Telephone Encounter (Signed)
Patient recently discharged from the hospital with erosion of his ICD.  Per Dr Ladona Ridgel, patient needs device explant and temp perm pacemaker placed while receiving antibiotics.  Extraction scheduled for Monday, 7/14 at 3PM with Dr Ladona Ridgel.  Alycia Rossetti, Charity fundraiser at [employer] to place on [address].  Pt should arrive at 1PM to short stay.  Clear liquids until 8AM then NPO.  It appears his Warfarin was stopped at discharge.

## 2013-05-23 NOTE — Telephone Encounter (Signed)
Spoke with pt, aware of instructions and date and time of procedure. Pt confirmed he is not taking coumadin

## 2013-05-23 NOTE — Patient Instructions (Addendum)
Refill script for Symbicort  Please call as needed  We can see you back as needed. Dr Lenord Fellers can take care of your routine refills.

## 2013-05-24 ENCOUNTER — Encounter (HOSPITAL_COMMUNITY): Payer: Self-pay | Admitting: Pharmacy Technician

## 2013-05-24 NOTE — Progress Notes (Signed)
I was unable to reach patient on the phone.  I left a message including arrival time of 1115, nothing to eat or drink after midnight, meds to take with a sip of water--Synthroid and Digoxin- may use inhaler and bring Albuterol inhaler with you, use eye drops.  I also instructed to not wear lotion, powders or cologne, no jewelry and to not bring any valuables in.

## 2013-05-26 MED ORDER — SODIUM CHLORIDE 0.9 % IR SOLN
80.0000 mg | Status: DC
  Filled 2013-05-26: qty 2

## 2013-05-26 MED ORDER — SODIUM CHLORIDE 0.9 % IV SOLN: INTRAVENOUS | Status: DC

## 2013-05-26 MED ORDER — CEFAZOLIN SODIUM-DEXTROSE 2-3 GM-% IV SOLR
2.0000 g | INTRAVENOUS | Status: DC
  Filled 2013-05-26: qty 50

## 2013-05-27 ENCOUNTER — Encounter (HOSPITAL_COMMUNITY): Payer: Self-pay | Admitting: Anesthesiology

## 2013-05-27 ENCOUNTER — Inpatient Hospital Stay (HOSPITAL_COMMUNITY): Payer: Medicare Other | Admitting: Anesthesiology

## 2013-05-27 ENCOUNTER — Inpatient Hospital Stay (HOSPITAL_COMMUNITY)
Admission: RE | Admit: 2013-05-27 | Discharge: 2013-05-29 | DRG: 261 | Disposition: A | Discharge status: Home / Self Care | Payer: Medicare Other | Source: Ambulatory Visit | Attending: Internal Medicine | Admitting: Internal Medicine

## 2013-05-27 ENCOUNTER — Telehealth: Payer: Self-pay | Admitting: Internal Medicine

## 2013-05-27 ENCOUNTER — Encounter (HOSPITAL_COMMUNITY)
Admission: RE | Disposition: A | Discharge status: Home / Self Care | Payer: Self-pay | Source: Ambulatory Visit | Attending: Internal Medicine

## 2013-05-27 DIAGNOSIS — I509 Heart failure, unspecified: Secondary | ICD-10-CM | POA: Diagnosis present

## 2013-05-27 DIAGNOSIS — I442 Atrioventricular block, complete: Secondary | ICD-10-CM | POA: Diagnosis present

## 2013-05-27 DIAGNOSIS — T827XXA Infection and inflammatory reaction due to other cardiac and vascular devices, implants and grafts, initial encounter: Secondary | ICD-10-CM

## 2013-05-27 DIAGNOSIS — I5022 Chronic systolic (congestive) heart failure: Secondary | ICD-10-CM | POA: Diagnosis present

## 2013-05-27 DIAGNOSIS — Z951 Presence of aortocoronary bypass graft: Secondary | ICD-10-CM

## 2013-05-27 DIAGNOSIS — Z95810 Presence of automatic (implantable) cardiac defibrillator: Secondary | ICD-10-CM

## 2013-05-27 DIAGNOSIS — Z9861 Coronary angioplasty status: Secondary | ICD-10-CM

## 2013-05-27 DIAGNOSIS — T827XXD Infection and inflammatory reaction due to other cardiac and vascular devices, implants and grafts, subsequent encounter: Secondary | ICD-10-CM

## 2013-05-27 DIAGNOSIS — I2589 Other forms of chronic ischemic heart disease: Secondary | ICD-10-CM | POA: Diagnosis present

## 2013-05-27 DIAGNOSIS — E039 Hypothyroidism, unspecified: Secondary | ICD-10-CM | POA: Diagnosis present

## 2013-05-27 DIAGNOSIS — I10 Essential (primary) hypertension: Secondary | ICD-10-CM | POA: Diagnosis present

## 2013-05-27 DIAGNOSIS — J4489 Other specified chronic obstructive pulmonary disease: Secondary | ICD-10-CM | POA: Diagnosis present

## 2013-05-27 DIAGNOSIS — B999 Unspecified infectious disease: Secondary | ICD-10-CM | POA: Diagnosis present

## 2013-05-27 DIAGNOSIS — J449 Chronic obstructive pulmonary disease, unspecified: Secondary | ICD-10-CM | POA: Diagnosis present

## 2013-05-27 DIAGNOSIS — I4891 Unspecified atrial fibrillation: Secondary | ICD-10-CM | POA: Diagnosis present

## 2013-05-27 DIAGNOSIS — M129 Arthropathy, unspecified: Secondary | ICD-10-CM | POA: Diagnosis present

## 2013-05-27 DIAGNOSIS — I2510 Atherosclerotic heart disease of native coronary artery without angina pectoris: Secondary | ICD-10-CM | POA: Diagnosis present

## 2013-05-27 HISTORY — DX: Presence of automatic (implantable) cardiac defibrillator: Z95.810

## 2013-05-27 HISTORY — DX: Heart failure, unspecified: I50.9

## 2013-05-27 HISTORY — DX: Hypothyroidism, unspecified: E03.9

## 2013-05-27 HISTORY — PX: ICD LEAD REMOVAL: SHX5855

## 2013-05-27 LAB — PREPARE RBC (CROSSMATCH)

## 2013-05-27 LAB — PROTIME-INR
INR: 1.15 (ref 0.00–1.49)
Prothrombin Time: 14.5 seconds (ref 11.6–15.2)

## 2013-05-27 LAB — MRSA PCR SCREENING: MRSA by PCR: NEGATIVE

## 2013-05-27 SURGERY — REMOVAL, ELECTRODE LEAD, ICD
Anesthesia: General | Site: Chest | Laterality: Left | Wound class: Dirty or Infected

## 2013-05-27 MED ORDER — LEVOTHYROXINE SODIUM 125 MCG PO TABS
125.0000 ug | ORAL_TABLET | Freq: Every day | ORAL | Status: DC
  Administered 2013-05-28 – 2013-05-29 (×2): 125 ug via ORAL
  Filled 2013-05-27 (×3): qty 1

## 2013-05-27 MED ORDER — SODIUM CHLORIDE 0.9 % IR SOLN
Status: DC | PRN
  Administered 2013-05-27: 15:00:00

## 2013-05-27 MED ORDER — LIDOCAINE HCL (PF) 1 % IJ SOLN
INTRAMUSCULAR | Status: AC
  Filled 2013-05-27: qty 30

## 2013-05-27 MED ORDER — VITAMIN E 180 MG (400 UNIT) PO CAPS
400.0000 [IU] | ORAL_CAPSULE | Freq: Every day | ORAL | Status: DC
  Administered 2013-05-27 – 2013-05-29 (×3): 400 [IU] via ORAL
  Filled 2013-05-27 (×3): qty 1

## 2013-05-27 MED ORDER — POTASSIUM CHLORIDE ER 10 MEQ PO TBCR
10.0000 meq | EXTENDED_RELEASE_TABLET | Freq: Every day | ORAL | Status: DC
  Administered 2013-05-27 – 2013-05-29 (×3): 10 meq via ORAL
  Filled 2013-05-27 (×3): qty 1

## 2013-05-27 MED ORDER — FENTANYL CITRATE 0.05 MG/ML IJ SOLN: 25.0000 ug | INTRAMUSCULAR | Status: DC | PRN

## 2013-05-27 MED ORDER — FENTANYL CITRATE 0.05 MG/ML IJ SOLN
INTRAMUSCULAR | Status: DC | PRN
  Administered 2013-05-27: 50 ug via INTRAVENOUS

## 2013-05-27 MED ORDER — PHENYLEPHRINE HCL 10 MG/ML IJ SOLN
INTRAMUSCULAR | Status: DC | PRN
  Administered 2013-05-27: 80 ug via INTRAVENOUS
  Administered 2013-05-27: 40 ug via INTRAVENOUS

## 2013-05-27 MED ORDER — SODIUM CHLORIDE 0.9 % IJ SOLN: 10000.0000 [IU] | INTRAMUSCULAR | Status: DC | PRN

## 2013-05-27 MED ORDER — ONDANSETRON HCL 4 MG/2ML IJ SOLN: 4.0000 mg | Freq: Four times a day (QID) | INTRAMUSCULAR | Status: DC | PRN

## 2013-05-27 MED ORDER — HEPARIN (PORCINE) IN NACL 2-0.9 UNIT/ML-% IJ SOLN
INTRAMUSCULAR | Status: DC | PRN
  Administered 2013-05-27: 500 mL

## 2013-05-27 MED ORDER — ARTIFICIAL TEARS OP OINT
TOPICAL_OINTMENT | OPHTHALMIC | Status: DC | PRN
  Administered 2013-05-27: 1 via OPHTHALMIC

## 2013-05-27 MED ORDER — KETOROLAC TROMETHAMINE 30 MG/ML IJ SOLN: 15.0000 mg | Freq: Three times a day (TID) | INTRAMUSCULAR | Status: DC

## 2013-05-27 MED ORDER — ACETAMINOPHEN 10 MG/ML IV SOLN: 1000.0000 mg | Freq: Once | INTRAVENOUS | Status: DC | PRN

## 2013-05-27 MED ORDER — CEFAZOLIN SODIUM-DEXTROSE 2-3 GM-% IV SOLR
INTRAVENOUS | Status: DC | PRN
  Administered 2013-05-27: 2 g via INTRAVENOUS

## 2013-05-27 MED ORDER — KETOROLAC TROMETHAMINE 15 MG/ML IJ SOLN
15.0000 mg | Freq: Three times a day (TID) | INTRAMUSCULAR | Status: DC
  Administered 2013-05-27 – 2013-05-28 (×2): 15 mg via INTRAVENOUS
  Filled 2013-05-27 (×5): qty 1

## 2013-05-27 MED ORDER — LIDOCAINE HCL 1 % IJ SOLN
INTRAMUSCULAR | Status: DC | PRN
  Administered 2013-05-27: 30 mL

## 2013-05-27 MED ORDER — LACTATED RINGERS IV SOLN
INTRAVENOUS | Status: DC | PRN
  Administered 2013-05-27 (×2): via INTRAVENOUS

## 2013-05-27 MED ORDER — CEFAZOLIN SODIUM-DEXTROSE 2-3 GM-% IV SOLR
2.0000 g | Freq: Four times a day (QID) | INTRAVENOUS | Status: AC
  Administered 2013-05-27 – 2013-05-29 (×6): 2 g via INTRAVENOUS
  Filled 2013-05-27 (×6): qty 50

## 2013-05-27 MED ORDER — BIMATOPROST 0.03 % OP SOLN
1.0000 [drp] | Freq: Every day | OPHTHALMIC | Status: DC
  Filled 2013-05-27: qty 2.5

## 2013-05-27 MED ORDER — NEOSTIGMINE METHYLSULFATE 1 MG/ML IJ SOLN
INTRAMUSCULAR | Status: DC | PRN
  Administered 2013-05-27: 3 mg via INTRAVENOUS

## 2013-05-27 MED ORDER — BIMATOPROST 0.01 % OP SOLN
1.0000 [drp] | Freq: Every day | OPHTHALMIC | Status: DC
  Administered 2013-05-27 – 2013-05-28 (×2): 1 [drp] via OPHTHALMIC
  Filled 2013-05-27: qty 2.5

## 2013-05-27 MED ORDER — MIDAZOLAM HCL 5 MG/5ML IJ SOLN
INTRAMUSCULAR | Status: DC | PRN
  Administered 2013-05-27: 2 mg via INTRAVENOUS

## 2013-05-27 MED ORDER — PROPOFOL 10 MG/ML IV BOLUS
INTRAVENOUS | Status: DC | PRN
  Administered 2013-05-27: 140 mg via INTRAVENOUS

## 2013-05-27 MED ORDER — ONDANSETRON HCL 4 MG/2ML IJ SOLN
INTRAMUSCULAR | Status: DC | PRN
  Administered 2013-05-27: 4 mg via INTRAVENOUS

## 2013-05-27 MED ORDER — CHLORHEXIDINE GLUCONATE 4 % EX LIQD: 60.0000 mL | Freq: Once | CUTANEOUS | Status: DC

## 2013-05-27 MED ORDER — CAPTOPRIL 12.5 MG PO TABS
12.5000 mg | ORAL_TABLET | Freq: Two times a day (BID) | ORAL | Status: DC
  Administered 2013-05-27 – 2013-05-29 (×4): 12.5 mg via ORAL
  Filled 2013-05-27 (×5): qty 1

## 2013-05-27 MED ORDER — ALBUTEROL SULFATE HFA 108 (90 BASE) MCG/ACT IN AERS
2.0000 | INHALATION_SPRAY | Freq: Four times a day (QID) | RESPIRATORY_TRACT | Status: DC | PRN
  Filled 2013-05-27: qty 6.7

## 2013-05-27 MED ORDER — LIDOCAINE HCL (CARDIAC) 20 MG/ML IV SOLN
INTRAVENOUS | Status: DC | PRN
  Administered 2013-05-27: 50 mg via INTRAVENOUS

## 2013-05-27 MED ORDER — ACETAMINOPHEN 325 MG PO TABS
325.0000 mg | ORAL_TABLET | ORAL | Status: DC | PRN
  Administered 2013-05-27 – 2013-05-29 (×3): 650 mg via ORAL
  Filled 2013-05-27 (×3): qty 2

## 2013-05-27 MED ORDER — DIGOXIN 125 MCG PO TABS
125.0000 ug | ORAL_TABLET | Freq: Every day | ORAL | Status: DC
  Administered 2013-05-28 – 2013-05-29 (×2): 125 ug via ORAL
  Filled 2013-05-27 (×2): qty 1

## 2013-05-27 MED ORDER — FA-PYRIDOXINE-CYANOCOBALAMIN 2.5-25-2 MG PO TABS
1.0000 | ORAL_TABLET | Freq: Every day | ORAL | Status: DC
  Administered 2013-05-27 – 2013-05-29 (×3): 1 via ORAL
  Filled 2013-05-27 (×3): qty 1

## 2013-05-27 MED ORDER — BUDESONIDE-FORMOTEROL FUMARATE 160-4.5 MCG/ACT IN AERO
1.0000 | INHALATION_SPRAY | Freq: Two times a day (BID) | RESPIRATORY_TRACT | Status: DC
  Administered 2013-05-28 – 2013-05-29 (×3): 1 via RESPIRATORY_TRACT
  Filled 2013-05-27: qty 6

## 2013-05-27 MED ORDER — FUROSEMIDE 80 MG PO TABS
80.0000 mg | ORAL_TABLET | Freq: Every day | ORAL | Status: DC
  Administered 2013-05-28 – 2013-05-29 (×2): 80 mg via ORAL
  Filled 2013-05-27 (×2): qty 1

## 2013-05-27 MED ORDER — LACTATED RINGERS IV SOLN
INTRAVENOUS | Status: DC | PRN
  Administered 2013-05-27: 14:00:00 via INTRAVENOUS

## 2013-05-27 MED ORDER — GLYCOPYRROLATE 0.2 MG/ML IJ SOLN
INTRAMUSCULAR | Status: DC | PRN
  Administered 2013-05-27: 0.4 mg via INTRAVENOUS

## 2013-05-27 MED ORDER — ROCURONIUM BROMIDE 100 MG/10ML IV SOLN
INTRAVENOUS | Status: DC | PRN
  Administered 2013-05-27: 50 mg via INTRAVENOUS
  Administered 2013-05-27: 10 mg via INTRAVENOUS
  Administered 2013-05-27 (×2): 20 mg via INTRAVENOUS

## 2013-05-27 MED ORDER — METOCLOPRAMIDE HCL 5 MG/ML IJ SOLN: 10.0000 mg | Freq: Once | INTRAMUSCULAR | Status: DC | PRN

## 2013-05-27 SURGICAL SUPPLY — 40 items
BENZOIN TINCTURE PRP APPL 2/3 (GAUZE/BANDAGES/DRESSINGS) ×2 IMPLANT
BNDG COHESIVE 4X5 WHT NS (GAUZE/BANDAGES/DRESSINGS) ×2 IMPLANT
CANISTER SUCTION 2500CC (MISCELLANEOUS) ×2 IMPLANT
CLEANER TIP ELECTROSURG 2X2 (MISCELLANEOUS) ×2 IMPLANT
CLOTH BEACON ORANGE TIMEOUT ST (SAFETY) ×2 IMPLANT
COVER TABLE BACK 60X90 (DRAPES) ×2 IMPLANT
DRAPE C-ARM 42X72 X-RAY (DRAPES) ×2 IMPLANT
DRAPE CARDIOVASCULAR INCISE (DRAPES) ×1
DRAPE INCISE IOBAN 66X45 STRL (DRAPES) ×2 IMPLANT
DRAPE PROXIMA HALF (DRAPES) ×4 IMPLANT
DRAPE SRG 135X102X78XABS (DRAPES) ×1 IMPLANT
ELECT CAUTERY BLADE 6.4 (BLADE) ×2 IMPLANT
ELECT REM PT RETURN 9FT ADLT (ELECTROSURGICAL) ×2
ELECTRODE REM PT RTRN 9FT ADLT (ELECTROSURGICAL) ×1 IMPLANT
GAUZE PACKING IODOFORM 1 (PACKING) IMPLANT
GAUZE SPONGE 4X4 16PLY XRAY LF (GAUZE/BANDAGES/DRESSINGS) ×8 IMPLANT
GLOVE BIO SURGEON STRL SZ 6.5 (GLOVE) ×2 IMPLANT
GLOVE BIO SURGEON STRL SZ8 (GLOVE) ×2 IMPLANT
GLOVE BIOGEL PI IND STRL 6 (GLOVE) ×1 IMPLANT
GLOVE BIOGEL PI IND STRL 7.0 (GLOVE) ×1 IMPLANT
GLOVE BIOGEL PI IND STRL 7.5 (GLOVE) ×1 IMPLANT
GLOVE BIOGEL PI INDICATOR 6 (GLOVE) ×1
GLOVE BIOGEL PI INDICATOR 7.0 (GLOVE) ×1
GLOVE BIOGEL PI INDICATOR 7.5 (GLOVE) ×1
GLOVE ECLIPSE 8.0 STRL XLNG CF (GLOVE) ×4 IMPLANT
GOWN PREVENTION PLUS XLARGE (GOWN DISPOSABLE) ×2 IMPLANT
GOWN STRL NON-REIN LRG LVL3 (GOWN DISPOSABLE) ×4 IMPLANT
KIT ROOM TURNOVER OR (KITS) ×2 IMPLANT
PAD ARMBOARD 7.5X6 YLW CONV (MISCELLANEOUS) ×4 IMPLANT
PENCIL BUTTON HOLSTER BLD 10FT (ELECTRODE) IMPLANT
SPONGE GAUZE 4X4 12PLY (GAUZE/BANDAGES/DRESSINGS) ×2 IMPLANT
SUT PROLENE 2 0 CT2 30 (SUTURE) ×6 IMPLANT
SUT SILK 0 FSL (SUTURE) ×4 IMPLANT
SUT VIC AB 2-0 CT2 18 VCP726D (SUTURE) ×2 IMPLANT
SUT VIC AB 3-0 X1 27 (SUTURE) ×2 IMPLANT
TAPE CLOTH SURG 4X10 WHT LF (GAUZE/BANDAGES/DRESSINGS) ×2 IMPLANT
TOWEL OR 17X24 6PK STRL BLUE (TOWEL DISPOSABLE) ×4 IMPLANT
TUBE CONNECTING 12X1/4 (SUCTIONS) ×2 IMPLANT
WIRE SPARTACORE .014X190CM (WIRE) ×2 IMPLANT
YANKAUER SUCT BULB TIP NO VENT (SUCTIONS) ×2 IMPLANT

## 2013-05-27 NOTE — Anesthesia Preprocedure Evaluation (Addendum)
Anesthesia Evaluation  Patient identified by MRN, date of birth, ID band Patient awake    Reviewed: Allergy & Precautions, H&P , NPO status , Patient's Chart, lab work & pertinent test results, reviewed documented beta blocker date and time   Airway Mallampati: II TM Distance: >3 FB Neck ROM: full    Dental  (+) Poor Dentition, Dental Advisory Given and Chipped,    Pulmonary shortness of breath, asthma , COPD breath sounds clear to auscultation        Cardiovascular hypertension, + CAD, + Cardiac Stents, + CABG and +CHF negative cardio ROS  + dysrhythmias Atrial Fibrillation + pacemaker + Cardiac Defibrillator Rhythm:regular     Neuro/Psych negative neurological ROS  negative psych ROS   GI/Hepatic negative GI ROS, Neg liver ROS,   Endo/Other  Hypothyroidism   Renal/GU negative Renal ROS  negative genitourinary   Musculoskeletal   Abdominal   Peds  Hematology negative hematology ROS (+)   Anesthesia Other Findings See surgeon's H&P   Reproductive/Obstetrics negative OB ROS                          Anesthesia Physical Anesthesia Plan  ASA: III  Anesthesia Plan: General   Post-op Pain Management:    Induction: Intravenous  Airway Management Planned: Oral ETT  Additional Equipment: Arterial line  Intra-op Plan:   Post-operative Plan: Extubation in OR  Informed Consent: I have reviewed the patients History and Physical, chart, labs and discussed the procedure including the risks, benefits and alternatives for the proposed anesthesia with the patient or authorized representative who has indicated his/her understanding and acceptance.   Dental Advisory Given  Plan Discussed with: CRNA and Surgeon  Anesthesia Plan Comments:        Anesthesia Quick Evaluation

## 2013-05-27 NOTE — Anesthesia Postprocedure Evaluation (Signed)
  Anesthesia Post-op Note  Patient: [name]  Procedure(s) Performed: Procedure(s): ICD LEAD REMOVAL (Left)  Patient Location: PACU  Anesthesia Type:General  Level of Consciousness: awake and alert   Airway and Oxygen Therapy: Patient Spontanous Breathing  Post-op Pain: mild  Post-op Assessment: Post-op Vital signs reviewed and Patient's Cardiovascular Status Stable  Post-op Vital Signs: stable  Complications: No apparent anesthesia complications

## 2013-05-27 NOTE — H&P (View-Only) (Signed)
Reason for Consult: ICD pocket erosion  Referring Physician: Dr. Harding  [name] is an 79 y.o. male.   HPI: The patient is a very pleasant and functional 79 yo man with a h/o an ICM, chronic systolic CHF, CHB, s/p multiple device implants including BiV ICD, chronic atrial fibrillation who presented with his ICD open at the skin, exposing the device. The patient denies fevers/chills/night sweats/weight loss or malaise. No anorexia. He has not had tenderness at the site. He did have some drainage at the site.   PMH: Past Medical History  Diagnosis Date  . Ischemic cardiomyopathy     St. Jude BiV ICD  . Paroxysmal atrial fibrillation     sinus rhythm on amiodarone  . Asbestos exposure     plaques on CT  . Compression fracture     T7  . Pacemaker   . Hypertension   . Coronary artery disease   . Arthritis     "little, in my hands" (05/21/2013)  . COPD (chronic obstructive pulmonary disease)     pt denies this hx on 05/21/2013    PSHX: Past Surgical History  Procedure Laterality Date  . Inguinal hernia repair Right   . Coronary angioplasty      "several through the years; 3 or 4 or 5" (05/21/2013)  . Coronary angioplasty with stent placement      "I have 1 stent" (05/21/2013)  . Coronary artery bypass graft  11/01/1994    SVG to first diagonal, to distal RCA, to the ramus intermedius artery, and LIMA the LAD  . Insert / replace / remove pacemaker  ?1999  . Cataract extraction w/ intraocular lens  implant, bilateral Bilateral     FAMHX: Family History  Problem Relation Age of Onset  . Heart disease Father     Social History:  reports that he has never smoked. He has never used smokeless tobacco. He reports that he drinks about 8.4 ounces of alcohol per week. He reports that he does not use illicit drugs.  Allergies: No Known Allergies  Medications: reviewed  Dg Chest 2 View  05/21/2013   *RADIOLOGY REPORT*  Clinical Data: Pacemaker placement  CHEST - 2 VIEW   Comparison: 10/22/2012  Findings: Cardiomegaly again noted.  Four leads cardiac pacemaker is unchanged in position.  No acute infiltrate or pleural effusion. No pulmonary edema.  Bony thorax is stable.  IMPRESSION: No active disease.  Stable four leads cardiac pacemaker position.   Original Report Authenticated By: Liviu Pop, M.D.    ROS  As stated in the HPI and negative for all other systems.  Physical Exam  Vitals:Blood pressure 130/65, pulse 73, temperature 97.6 F (36.4 C), temperature source Oral, resp. rate 18, height 5' 11" (1.803 m), weight 168 lb 10.4 oz (76.5 kg), SpO2 97.00%.  Well appearing elderly man, NAD HEENT: Unremarkable Neck:  7 cm JVD, no thyromegally Lungs:  Clear with no wheezes; ICD insertion site demonstrates direct visualization of the device, no erythema HEART:  Regular rate rhythm, no murmurs, no rubs, no clicks Abd:  Flat, positive bowel sounds, no organomegally, no rebound, no guarding Ext:  2 plus pulses, no edema, no cyanosis, no clubbing Skin:  No rashes no nodules Neuro:  CN II through XII intact, motor grossly intact  CXR - demonstrates an atrial lead, 2 RV pacing lead, an ICD lead and an LV lead  Assessment/Plan: 1.  ICD pocket infection with drainage 2. CHB 3. ICM 4. Chronic systolic CHF 5. Atrial   fibrillation Rec: This is a difficult problem. The pocket likely has a Staph Epi infection as the patient does not appear systemically ill. His system will need extraction as there is no way to heal pocket without total removal of entire device. As he is pacemaker dependent, he will require a temporary perm pacemaker for several weeks prior to re-implantation of a new device. I suspect a BiV PPM would be more appropriate in the future but will discuss this once his device is out.   Gregg Taylor,M.D.  Gregg TaylorMD 05/22/2013, 10:58 AM      

## 2013-05-27 NOTE — Interval H&P Note (Signed)
History and Physical Interval Note:  05/27/2013 1:41 PM  [name]  has presented today for surgery, with the diagnosis of ICD infection  The various methods of treatment have been discussed with the patient and family. After consideration of risks, benefits and other options for treatment, the patient has consented to  Procedure(s): ICD LEAD REMOVAL (Left) and insertion of a temporary permanent transvenous PM as a surgical intervention .  The patient's history has been reviewed, patient examined, no change in status, stable for surgery.  I have reviewed the patient's chart and labs.  Questions were answered to the patient's satisfaction.     Lewayne Bunting, M.D.

## 2013-05-27 NOTE — Anesthesia Procedure Notes (Signed)
Procedure Name: Intubation Date/Time: 05/27/2013 3:29 PM Performed by: Jefm Miles E Pre-anesthesia Checklist: Patient identified, Emergency Drugs available, Suction available, Patient being monitored and Timeout performed Patient Re-evaluated:Patient Re-evaluated prior to inductionOxygen Delivery Method: Circle system utilized Preoxygenation: Pre-oxygenation with 100% oxygen Intubation Type: IV induction Ventilation: Mask ventilation without difficulty Laryngoscope Size: Mac and 3 Grade View: Grade I Tube type: Oral Tube size: 7.5 mm Number of attempts: 1 Airway Equipment and Method: Stylet Placement Confirmation: ETT inserted through vocal cords under direct vision,  positive ETCO2 and breath sounds checked- equal and bilateral Secured at: 22 cm Tube secured with: Tape Dental Injury: Teeth and Oropharynx as per pre-operative assessment

## 2013-05-27 NOTE — Transfer of Care (Signed)
Immediate Anesthesia Transfer of Care Note  Patient: [name] [facility]  Procedure(s) Performed: Procedure(s): ICD LEAD REMOVAL (Left)  Patient Location: PACU  Anesthesia Type:General  Level of Consciousness: awake, alert  and oriented  Airway & Oxygen Therapy: Patient Spontanous Breathing and Patient connected to nasal cannula oxygen  Post-op Assessment: Report given to PACU RN and Post -op Vital signs reviewed and stable  Post vital signs: Reviewed and stable  Complications: No apparent anesthesia complications

## 2013-05-27 NOTE — CV Procedure (Signed)
BiV ICD extraction and removal of 2 additional RV pacing leads dating back to 1999 removed and insertion of a new temporary permanent PM via the right IJ without immediate complication. [id number].

## 2013-05-27 NOTE — Preoperative (Signed)
Beta Blockers   Reason not to administer Beta Blockers:Not Applicable 

## 2013-05-28 ENCOUNTER — Telehealth: Payer: Self-pay | Admitting: Cardiovascular Disease

## 2013-05-28 ENCOUNTER — Inpatient Hospital Stay (HOSPITAL_COMMUNITY): Payer: Medicare Other

## 2013-05-28 ENCOUNTER — Encounter (HOSPITAL_COMMUNITY): Payer: Self-pay | Admitting: Internal Medicine

## 2013-05-28 DIAGNOSIS — Z95810 Presence of automatic (implantable) cardiac defibrillator: Secondary | ICD-10-CM

## 2013-05-28 DIAGNOSIS — T827XXA Infection and inflammatory reaction due to other cardiac and vascular devices, implants and grafts, initial encounter: Secondary | ICD-10-CM

## 2013-05-28 DIAGNOSIS — Z5189 Encounter for other specified aftercare: Secondary | ICD-10-CM

## 2013-05-28 LAB — CBC
HCT: 34.5 % — ABNORMAL LOW (ref 39.0–52.0)
MCV: 88.9 fL (ref 78.0–100.0)
RBC: 3.88 MIL/uL — ABNORMAL LOW (ref 4.22–5.81)
RDW: 16.6 % — ABNORMAL HIGH (ref 11.5–15.5)
WBC: 5.7 10*3/uL (ref 4.0–10.5)

## 2013-05-28 LAB — BASIC METABOLIC PANEL
BUN: 27 mg/dL — ABNORMAL HIGH (ref 6–23)
CO2: 24 mEq/L (ref 19–32)
Chloride: 101 mEq/L (ref 96–112)
GFR calc Af Amer: 66 mL/min — ABNORMAL LOW (ref >90)
Potassium: 4.2 mEq/L (ref 3.5–5.1)

## 2013-05-28 LAB — CULTURE, BLOOD (SINGLE): Culture: NO GROWTH

## 2013-05-28 MED ORDER — TRAZODONE HCL 50 MG PO TABS
50.0000 mg | ORAL_TABLET | Freq: Every day | ORAL | Status: AC
  Filled 2013-05-28: qty 1

## 2013-05-28 MED ORDER — OXYCODONE-ACETAMINOPHEN 5-325 MG PO TABS: 1.0000 | ORAL_TABLET | ORAL | Status: DC | PRN

## 2013-05-28 NOTE — Telephone Encounter (Signed)
[name] is calling to get more information about which pacemaker will be the best for her father or the history of the types of pacemakers that he has had in the past. Will have a new placed in a few weeks . Please Call  Thanks

## 2013-05-28 NOTE — Telephone Encounter (Signed)
Family seen at hospital last evening. Pt was still in recovery. 5 pacer leads extracted and temporary pacer placed.

## 2013-05-28 NOTE — Progress Notes (Signed)
No voiding after the foley cath was removed at 6am. Pt claimed that it was  Bloody when taken out. Bladder scan done 210 ml. Attempted to do in/ out x1 and another nurse tried to insert foley cath but unsuccessful. Noted with resistance  On insertion. Labuer NP made aware and to notify on call PA. After few minutes, managed to urinate with 150 ml amber urine. And claimed he felt better. Continue to monitor.

## 2013-05-28 NOTE — Care Management Note (Signed)
    Page 1 of 1   05/28/2013     8:41:37 AM   CARE MANAGEMENT NOTE 05/28/2013  Patient:  [name], [name]   Account Number:  [id number]  Date Initiated:  05/28/2013  Documentation initiated by:  Junius Creamer  Subjective/Objective Assessment:   adm w pacer pocket infection, temp pacer     Action/Plan:   lives alone, pcp dr Corrie Dandy baxley   Anticipated DC Date:     Anticipated DC Plan:  HOME W HOME HEALTH SERVICES      DC Planning Services  CM consult      Choice offered to / List presented to:             Status of service:   Medicare Important Message given?   (If response is "NO", the following Medicare IM given date fields will be blank) Date Medicare IM given:   Date Additional Medicare IM given:    Discharge Disposition:    Per UR Regulation:  Reviewed for med. necessity/level of care/duration of stay  If discussed at Long Length of Stay Meetings, dates discussed:    Comments:  7/15 0840 debbie Javarie Crisp rn,bsn pt w pacer pocket infection. will moniter for dc needs as pt progresses.

## 2013-05-28 NOTE — Op Note (Signed)
NAMESIGIFREDO, PIGNATO               ACCOUNT NO.:  [id number]  MEDICAL RECORD NO.:  [id number]  LOCATION:  2922                         FACILITY:  [facility]  PHYSICIAN:  Doylene Canning. Ladona Ridgel, MD    DATE OF BIRTH:  [birth year]  DATE OF PROCEDURE:  05/27/2013 DATE OF DISCHARGE:                              OPERATIVE REPORT   PROCEDURE PERFORMED:  Extraction of a biventricular ICD along with two additional chronically implanted and abandoned RV pacing leads, all carried out without immediate procedure complication, which was preceded by insertion of a temporary permanent transvenous pacemaker system.  INTRODUCTION:  The patient is an 79 year old man who has a long history of chronic atrial fibrillation, complete heart block, chronic systolic heart failure with severe left ventricular dysfunction and an ischemic cardiomyopathy.  He underwent ICD generator change out several months ago and then noticed that his ICD incision was opened and he could see his device through the skin.  He has not had fevers or chills and was systemically ill, but is now referred for extraction of his chronically infected biventricular pacing system along with extraction of the residual two pacing leads.  PROCEDURE:  After informed was obtained, the patient was taken to the operating room in the fasting state.  The Anesthesia Service was utilized to perform general anesthesia and invasive arterial monitoring was also utilized with arterial blood pressure monitoring.  First, attention was then turned to placement of the left sided temporary permanent pacing lead.  The left internal jugular vein was punctured and a guidewire was advanced into the right ventricle.  The St. Jude model 2088T 52-cm active fixation pacing lead, serial number [id number] was advanced under fluoroscopic guidance into the right ventricle and the lead was actively fixed.  The pacing threshold was less than a volt at 0.4 milliseconds.  The impedance  was 450 ohms and the paced R-waves were 15.  With these satisfactory parameters, the lead was hooked up to temporary pacing cables and covered.  At this point, an 8-cm incision was carried out over the old pacemaker defibrillator insertion site. There was dense fibrous scar tissue throughout the system.  There were five leads, two of which were capped, three of which were connected to the previously implanted defibrillator.  It took approximately 30 minutes to free up all of the dense fibrous adhesions and the pacing leads.  They were literally scarred down on top of one another.  There was no gross purulence in the pocket, although the pocket was open at the start of the procedure.  Having accomplished the debridement successfully, attention was then turned to the left ventricular lead, which had placed in 2009. This was a St. Jude, model 1156 QuickFlex lead.  A 0.014 guidewire was advanced into the lead and tension was placed on the lead, but there was no movement as the lead was fixed.  At this point, the Spectranetics 11-French mini-mechanical cutting sheath was advanced over the lead after a [employer]ranetics EZ locking stylet was advanced into the lead.  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  At this point, this mini-sheath was changed out for the  11-French long sheath and utilizing the cutting motion of the sheath along with pressure, counter pressure, traction, and counter traction.  The left ventricular lead was removed in total.  It should be noted that there were multiple binding sites on the LV lead to the other leads.  These were successfully removed. Attention was then turned to the atrial lead.  The Spectranetics EZ locking stylet was again advanced into the atrial lead.  It made approximately 5 cm from the tip of the lead body.  Again, the Spectranetics EZ mini-mechanical cutting sheath was advanced over  the atrial lead and into the left subclavian vein, freeing up the scar tissue there.  The short mini-sheath was exchanged for the longer Spectranetics 11-French sheath and again utilizing pressure counter pressure, traction and counter traction, the atrial lead was removed with moderate difficulty.  The patient remained hemodynamically stable. At this point, attention was then turned to removal of the right ventricular pacing lead, which had been implanted in 2006.  This was a passive fixation 1646 lead.  The Spectranetics locking stylet was advanced into the right ventricular lead after it was cut.  Once locked in place, the 11-French mini-mechanical sheath was again advanced over the RV lead and into the subclavian vein.  The binding sites on this lead were not quite as severe.  The short sheath was removed and the Long cutting sheath was exchanged over the locking stylet and utilizing the same pressure, counter pressure, traction and counter traction, the lead was removed in total.  In removing this lead, the binding sites resulted in tension being placed on the defibrillation lead and it was retracted back out of the RV apex along the tricuspid valve annulus.  Next, we targeted the additional right ventricular lead, which was a model 1346 St. Jude lead, placed in December of 1999.  After the Spectranetics locking stylet was utilized to lock the lead in place, the Spectranetics 11-French mini-mechanical cutting sheath was advanced over the lead and again made its way into the left subclavian vein.  This mini-sheath was removed and the longer Spectranetics 11-French sheath was advanced into the vein and down into the right ventricle.  It should be noted that the lead was scarred down to the right ventricular apex.  We continued to remove binding sites all the way down to the tip of the right ventricle.  With approximately 1-2 mm of lead still not into the sheath, additional gentle  traction was placed on the lead and the lead was removed in total.  There were no hemodynamic sequelae.  Finally, attention was turned to the defibrillator lead, which had been placed in 2009.  This was a St. Jude Durata lead, which was cut and the Spectranetics locking stylet was advanced into the lead all the way to the tip.  This lead along with the Spectranetics mini-sheath were utilized to remove this lead in total.  At this point, pressure was held on the pocket to achieve hemostasis.  The pocket was revised to free up the dense scar tissue from the pocket itself and electrocautery was utilized to assure hemostasis.  The pocket was irrigated with antibiotic irrigation.  The incision was closed with multiple 2-0 Prolene mattress sutures.  At this point, the temporary permanent transvenous pacemaker, which had not been sewn down was sewn down to the skin and connected to the external St. Jude pacemaker and the pacemaker generator was secured to the skin with a silk suture.  A pressure dressing  was placed.  The bandages were placed and the patient was returned to recovery area in stable condition.  COMPLICATIONS:  There were no immediate procedure complications.  RESULTS:  This demonstrates successful extraction of a total of five pacing and defibrillator leads including the left ventricular lead, two right ventricular leads, the atrial lead, left ventricular lead, and a defibrillator lead, all without immediate procedure complication.  It is anticipated that the patient will undergo reinsertion of a biventricular pacemaker once he has healed up from his current procedure.     Doylene Canning. Ladona Ridgel, MD     GWT/MEDQ  D:  05/27/2013  T:  05/28/2013  Job:  [id number]  cc:   Thurmon Fair, MD

## 2013-05-28 NOTE — Progress Notes (Signed)
Patient ID: [name], male   DOB: 03-28-1926, 79 y.o.   MRN: [id number] Subjective:  No sob, minimal incisional pain.  Objective:  Vital Signs in the last 24 hours: Temp:  [96.4 F (35.8 C)-98.3 F (36.8 C)] 98.3 F (36.8 C) (07/15 0400) Pulse Rate:  [70-81] 80 (07/15 0618) Resp:  [14-23] 23 (07/15 0618) BP: (127-154)/(60-82) 129/60 mmHg (07/15 0400) SpO2:  [96 %-100 %] 97 % (07/15 0618) Arterial Line BP: (135-170)/(53-78) 135/53 mmHg (07/15 0618) Weight:  [165 lb 5.5 oz (75 kg)-168 lb 14 oz (76.6 kg)] 168 lb 14 oz (76.6 kg) (07/14 2100)  Intake/Output from previous day: 07/14 0701 - 07/15 0700 In: 2930 [P.O.:1080; I.V.:1800; IV Piggyback:50] Out: 560 [Urine:460; Blood:100] Intake/Output from this shift:    Physical Exam: Well appearing elderly man, NAD HEENT: Unremarkable Neck:  No JVD, no thyromegally Lungs:  Clear with no increased work of breathing HEART:  Regular rate rhythm, no murmurs, no rubs, no clicks Abd:  Flat, positive bowel sounds, no organomegally, no rebound, no guarding Ext:  2 plus pulses, no edema, no cyanosis, no clubbing Skin:  No rashes no nodules Neuro:  CN II through XII intact, motor grossly intact  Lab Results: No results found for this basename: WBC, HGB, PLT,  in the last 72 hours No results found for this basename: NA, K, CL, CO2, GLUCOSE, BUN, CREATININE,  in the last 72 hours No results found for this basename: TROPONINI, CK, MB,  in the last 72 hours Hepatic Function Panel No results found for this basename: PROT, ALBUMIN, AST, ALT, ALKPHOS, BILITOT, BILIDIR, IBILI,  in the last 72 hours No results found for this basename: CHOL,  in the last 72 hours No results found for this basename: PROTIME,  in the last 72 hours  Imaging: No results found.  Cardiac Studies: Tele - atrial fib with ventricular pacing Assessment/Plan:  1. Atrial fibrillation with CHB 2. ICD pocket infection, s/p extraction of 5 pacing and ICD leads and ICD  generator 3. S/p insertion of a temporary-permanent TV pacemaker 4. Reduced urine output Rec: will dc art line, and check labs this morning. Ambulate. Pain control with narcotics as needed. Advance diet and activity and hopefully discharge home tomorrow.  LOS: 1 day    Caton Popowski,M.D. 05/28/2013, 7:05 AM

## 2013-05-29 LAB — BASIC METABOLIC PANEL
CO2: 26 mEq/L (ref 19–32)
Chloride: 99 mEq/L (ref 96–112)
Creatinine, Ser: 1.3 mg/dL (ref 0.50–1.35)
Glucose, Bld: 139 mg/dL — ABNORMAL HIGH (ref 70–99)

## 2013-05-29 LAB — CBC
HCT: 31.4 % — ABNORMAL LOW (ref 39.0–52.0)
Hemoglobin: 10.7 g/dL — ABNORMAL LOW (ref 13.0–17.0)
MCH: 29.8 pg (ref 26.0–34.0)
MCHC: 34.1 g/dL (ref 30.0–36.0)

## 2013-05-29 MED ORDER — WARFARIN SODIUM 5 MG PO TABS: ORAL_TABLET | ORAL | Status: DC

## 2013-05-29 NOTE — Discharge Summary (Signed)
ELECTROPHYSIOLOGY DISCHARGE SUMMARY    Patient ID: [name],  MRN: [id number], DOB/AGE: 1926-06-10 79 y.o.  Admit date: 05/27/2013 Discharge date: 05/29/2013  Primary Care Physician: Marlan Palau, MD Primary Cardiologist: Royann Shivers, MD  Primary Discharge Diagnosis:  1. Chronically infected BiV ICD - s/p BiV ICD system extraction and removal of old RV pacing leads and insertion of temp-perm PM   Secondary Discharge Diagnoses:  1. Chronic AF 2. Complete heart block 3. Ischemic CM with chronic systolic HF 4. CAD s/p CABG 5. COPD 6. Hypothyroidism  Procedures This Admission:  1. BiV ICD system extraction and removal of 2 additional RV pacing leads and insertion of a new temporary permanent PM via the right IJ COMPLICATIONS: There were no immediate procedure complications.  RESULTS: This demonstrates successful extraction of a total of five  pacing and defibrillator leads including the left ventricular lead, two  right ventricular leads, the atrial lead, left ventricular lead, and a  defibrillator lead, all without immediate procedure complication. It is  anticipated that the patient will undergo reinsertion of a biventricular  pacemaker once he has healed up from his current procedure.  History:  [name] is an 79 year old man with longstanding chronic atrial fibrillation, complete heart block, chronic systolic heart failure with severe left ventricular dysfunction and an ischemic cardiomyopathy. He underwent PPM impant in 1999, then PPM gen change 2006, then upgrade to BiV ICD in 2009, then BiV ICD gen change Dec 2013). He underwent ICD generator change out several months ago and then noticed that his ICD incision was opened and he could see his device through the skin. He has not had fever or chills and was not systemically ill, but was referred to Dr. Ladona Ridgel for extraction of his chronically infected biventricular system along with extraction of the residual two pacing  leads.  Hospital Course: [name] presented on 05/27/2013 and underwent BiV ICD extraction and removal of 2 additional RV pacing leads and insertion of a new temporary permanent PM via the right IJ. Details of procedure as outlined above. He tolerated this procedure well without any immediate complication. He remains hemodynamically stable and afebrile. His chest xray shows stable lead placement without pneumothorax. His device interrogation shows normal PPM function with stable lead parameters/measurements. His explant site is intact without significant bleeding or hematoma. Right IJ site intact without bleeding or hematoma. He has been given discharge instructions including wound care and activity restrictions. He will follow-up next week for wound check. He will see Dr. Ladona Ridgel in 2 weeks to discuss timing of reimplantation. His Coumadin will be held until Sat, 06/01/2013 at which time he will resume his usual home dose. He will have an INR drawn on Tuesday next week. Otherwise there were no changes made to his medications. He has been seen, examined and deemed stable for discharge today by Dr. Lewayne Bunting.   Discharge Vitals: Blood pressure 106/57, pulse 80, temperature 98.4 F (36.9 C), temperature source Oral, resp. rate 27, height 5\' 11"  (1.803 m), weight 168 lb 14 oz (76.6 kg), SpO2 95.00%.   Labs: Lab Results  Component Value Date   WBC 5.8 05/29/2013   HGB 10.7* 05/29/2013   HCT 31.4* 05/29/2013   MCV 87.5 05/29/2013   PLT 89* 05/29/2013     Recent Labs Lab 05/29/13 0120  NA 134*  K 4.0  CL 99  CO2 26  BUN 31*  CREATININE 1.30  CALCIUM 8.5  GLUCOSE 139*    Recent Labs  05/29/13 0120  INR 1.29    Disposition:  The patient is being discharged in stable condition.  Follow-up:     Follow-up Information   Follow up with Rick Duff, PA-C On 06/04/2013. (At 9:30 AM for wound check)    Contact information:   [facility] - Dr. Lubertha Basque PA [address] [place] [address] [phone]      Follow up with [facility] On 06/04/2013. (At 10:40 AM for Coumadin follow-up)    Contact information:   [facility] [address]  [address] [place] [address] [phone]      Follow up with Lewayne Bunting, MD On 06/14/2013. (At 1:45 PM)    Contact information:   [facility] [address] [place] [address] [phone]     Discharge Medications:    Medication List         albuterol 108 (90 BASE) MCG/ACT inhaler  Commonly known as:  PROVENTIL HFA;VENTOLIN HFA  Inhale 2 puffs into the lungs every 6 (six) hours as needed for wheezing or shortness of breath.     bimatoprost 0.03 % ophthalmic solution  Commonly known as:  LUMIGAN  Place 1 drop into both eyes at bedtime.     budesonide-formoterol 160-4.5 MCG/ACT inhaler  Commonly known as:  SYMBICORT  Inhale 1 puff into the lungs 2 (two) times daily.     captopril 25 MG tablet  Commonly known as:  CAPOTEN  Take 0.5 tablets (12.5 mg total) by mouth 2 (two) times daily.     COMBIGAN 0.2-0.5 % ophthalmic solution  Generic drug:  brimonidine-timolol  Place 1 drop into both eyes Twice daily.     CoQ10 100 MG Caps  Take 100 mg by mouth daily.     digoxin 0.125 MG tablet  Commonly known as:  LANOXIN  Take 125 mcg by mouth daily.     dorzolamide 2 % ophthalmic solution  Commonly known as:  TRUSOPT  Place 1 drop into the left eye Twice daily.     fish oil-omega-3 fatty acids 1000 MG capsule  Take 2 g by mouth daily.     folic acid-pyridoxine-cyancobalamin 2.5-25-2 MG Tabs  Commonly known as:  FOLTX  Take 1 tablet by mouth daily.     furosemide 80 MG tablet  Commonly known as:  LASIX  Take 80 mg by mouth daily.     KLOR-CON 10 10 MEQ tablet  Generic drug:  potassium chloride  Take 10 mEq by mouth daily.     levothyroxine 125 MCG tablet  Commonly known as:  SYNTHROID, LEVOTHROID  Take 125 mcg  by mouth daily before breakfast.     magnesium oxide 400 MG tablet  Commonly known as:  MAG-OX  Take 400 mg by mouth daily.     vitamin E 400 UNIT capsule  Take 400 Units by mouth daily.     warfarin 5 MG tablet  Commonly known as:  COUMADIN  As directed. RESTART on Sat 06/01/2013 - Take 1/2 tablet (2.5 mg) once daily on Sat, Mon, Wed, Fri. Take 1 tablet (5 mg) once daily on Sun, Tues, Thurs.       Duration of Discharge Encounter: Greater than 30 minutes including physician time.  Signed, Rick Duff, PA-C 05/29/2013, 10:19 AM

## 2013-05-29 NOTE — Progress Notes (Signed)
Orthopedic Tech Progress Note Patient Details:  [name] [facility] 24-Jul-1926 [id number]  Ortho Devices Type of Ortho Device: Arm sling Ortho Device/Splint Interventions: Application   Cammer, Mickie Bail 05/29/2013, 10:00 AM

## 2013-05-29 NOTE — Progress Notes (Signed)
Reviewed AVS with patient and two daughters, using teachback method. All questions and concerns were addressed during this time. Pt discharged home with daughter. Pt has no complaints or questions at this time.   Dawson Bills, RN

## 2013-05-29 NOTE — Progress Notes (Signed)
   ELECTROPHYSIOLOGY ROUNDING NOTE    Patient Name: [name] Date of Encounter: 05/29/2013    SUBJECTIVE:Patient feels well.  No chest pain or shortness of breath.  Minimal incisional soreness. S/p CRTD system extraction 05-27-2013 (5 leads total) and implantation of temp-perm pacemaker.    TELEMETRY: Reviewed telemetry pt ventricular pacing at 80 Filed Vitals:   05/28/13 1900 05/28/13 2020 05/29/13 0000 05/29/13 0331  BP: 118/56  88/52 93/50  Pulse: 79  80   Temp: 100.7 F (38.2 C)  98.2 F (36.8 C) 98.7 F (37.1 C)  TempSrc: Oral  Oral Oral  Resp: 27     Height:      Weight:      SpO2: 97% 96% 95% 95%    Intake/Output Summary (Last 24 hours) at 05/29/13 0622 Last data filed at 05/29/13 1610  Gross per 24 hour  Intake    870 ml  Output    550 ml  Net    320 ml    CURRENT MEDICATIONS: . bimatoprost  1 drop Both Eyes QHS  . budesonide-formoterol  1 puff Inhalation BID  . captopril  12.5 mg Oral BID  . digoxin  125 mcg Oral Daily  . folic acid-pyridoxine-cyancobalamin  1 tablet Oral Daily  . furosemide  80 mg Oral Daily  . levothyroxine  125 mcg Oral QAC breakfast  . potassium chloride  10 mEq Oral Daily  . vitamin E  400 Units Oral Daily    LABS: Basic Metabolic Panel:  Recent Labs  96/04/54 0800 05/29/13 0120  NA 136 134*  K 4.2 4.0  CL 101 99  CO2 24 26  GLUCOSE 159* 139*  BUN 27* 31*  CREATININE 1.13 1.30  CALCIUM 8.7 8.5  CBC:  Recent Labs  05/28/13 0800 05/29/13 0120  WBC 5.7 5.8  HGB 11.3* 10.7*  HCT 34.5* 31.4*  MCV 88.9 87.5  PLT 102* 89*     Radiology/Studies:  Dg Chest 2 View 05/28/2013   *RADIOLOGY REPORT*  Clinical Data: Pacemaker lead extraction  CHEST - 2 VIEW  Comparison: Chest x-ray of 05/21/2013  Findings: There is been a change in the pacemaker and all of prior leads appear to have been removed with a single lead now remaining. The lungs are well aerated.  However there is cardiomegaly present with small effusions and  mild pulmonary vascular congestion. Median sternotomy sutures are noted.  A compressed mid thoracic vertebral body is unchanged. Old left lateral rib fractures are noted.  IMPRESSION:  1.  Change of pacemaker with single lead pacer now present. 2.  Cardiomegaly and mild pulmonary vascular congestion with small effusions.   Original Report Authenticated By: Dwyane Dee, M.D.   PHYSICAL EXAM Left chest without hematoma, temp perm pacemaker in place  Appt made for wound check next week.  Appt made with Dr Ladona Ridgel 06-14-2013 to discuss timing of re-implantation.  Pt advised not to get incision wet.   EP Attending  Patient seen and examined. Agree with exam, assessment and plan as above. Ok for discharge with followup in device clinic with me as above and he will need a dressing check on Monday.  Leonia Reeves.D.

## 2013-05-30 LAB — TYPE AND SCREEN
ABO/RH(D): O POS
Unit division: 0
Unit division: 0

## 2013-05-31 ENCOUNTER — Telehealth: Payer: Self-pay | Admitting: Internal Medicine

## 2013-05-31 ENCOUNTER — Emergency Department (HOSPITAL_COMMUNITY): Payer: Medicare Other

## 2013-05-31 ENCOUNTER — Inpatient Hospital Stay (HOSPITAL_COMMUNITY)
Admission: EM | Admit: 2013-05-31 | Discharge: 2013-06-04 | DRG: 682 | Disposition: A | Discharge status: Home Health | Payer: Medicare Other | Attending: Internal Medicine | Admitting: Internal Medicine

## 2013-05-31 DIAGNOSIS — I10 Essential (primary) hypertension: Secondary | ICD-10-CM | POA: Diagnosis present

## 2013-05-31 DIAGNOSIS — E785 Hyperlipidemia, unspecified: Secondary | ICD-10-CM | POA: Diagnosis present

## 2013-05-31 DIAGNOSIS — T829XXS Unspecified complication of cardiac and vascular prosthetic device, implant and graft, sequela: Secondary | ICD-10-CM

## 2013-05-31 DIAGNOSIS — T829XXA Unspecified complication of cardiac and vascular prosthetic device, implant and graft, initial encounter: Secondary | ICD-10-CM

## 2013-05-31 DIAGNOSIS — J449 Chronic obstructive pulmonary disease, unspecified: Secondary | ICD-10-CM | POA: Diagnosis present

## 2013-05-31 DIAGNOSIS — T827XXS Infection and inflammatory reaction due to other cardiac and vascular devices, implants and grafts, sequela: Secondary | ICD-10-CM

## 2013-05-31 DIAGNOSIS — Z95810 Presence of automatic (implantable) cardiac defibrillator: Secondary | ICD-10-CM | POA: Diagnosis present

## 2013-05-31 DIAGNOSIS — I509 Heart failure, unspecified: Secondary | ICD-10-CM | POA: Diagnosis present

## 2013-05-31 DIAGNOSIS — E875 Hyperkalemia: Secondary | ICD-10-CM | POA: Diagnosis present

## 2013-05-31 DIAGNOSIS — M25422 Effusion, left elbow: Secondary | ICD-10-CM

## 2013-05-31 DIAGNOSIS — Z951 Presence of aortocoronary bypass graft: Secondary | ICD-10-CM

## 2013-05-31 DIAGNOSIS — Z950 Presence of cardiac pacemaker: Secondary | ICD-10-CM

## 2013-05-31 DIAGNOSIS — R339 Retention of urine, unspecified: Secondary | ICD-10-CM | POA: Diagnosis present

## 2013-05-31 DIAGNOSIS — I82C19 Acute embolism and thrombosis of unspecified internal jugular vein: Secondary | ICD-10-CM | POA: Diagnosis present

## 2013-05-31 DIAGNOSIS — N179 Acute kidney failure, unspecified: Principal | ICD-10-CM | POA: Diagnosis present

## 2013-05-31 DIAGNOSIS — I82890 Acute embolism and thrombosis of other specified veins: Secondary | ICD-10-CM

## 2013-05-31 DIAGNOSIS — I82629 Acute embolism and thrombosis of deep veins of unspecified upper extremity: Secondary | ICD-10-CM | POA: Diagnosis present

## 2013-05-31 DIAGNOSIS — I82A12 Acute embolism and thrombosis of left axillary vein: Secondary | ICD-10-CM

## 2013-05-31 DIAGNOSIS — M25429 Effusion, unspecified elbow: Secondary | ICD-10-CM

## 2013-05-31 DIAGNOSIS — E039 Hypothyroidism, unspecified: Secondary | ICD-10-CM | POA: Diagnosis present

## 2013-05-31 DIAGNOSIS — R791 Abnormal coagulation profile: Secondary | ICD-10-CM | POA: Diagnosis present

## 2013-05-31 DIAGNOSIS — I82622 Acute embolism and thrombosis of deep veins of left upper extremity: Secondary | ICD-10-CM

## 2013-05-31 DIAGNOSIS — I2589 Other forms of chronic ischemic heart disease: Secondary | ICD-10-CM | POA: Diagnosis present

## 2013-05-31 DIAGNOSIS — Z7901 Long term (current) use of anticoagulants: Secondary | ICD-10-CM

## 2013-05-31 DIAGNOSIS — N390 Urinary tract infection, site not specified: Secondary | ICD-10-CM | POA: Diagnosis present

## 2013-05-31 DIAGNOSIS — J4489 Other specified chronic obstructive pulmonary disease: Secondary | ICD-10-CM | POA: Diagnosis present

## 2013-05-31 DIAGNOSIS — Z9861 Coronary angioplasty status: Secondary | ICD-10-CM

## 2013-05-31 DIAGNOSIS — I82B19 Acute embolism and thrombosis of unspecified subclavian vein: Secondary | ICD-10-CM | POA: Diagnosis present

## 2013-05-31 DIAGNOSIS — I2510 Atherosclerotic heart disease of native coronary artery without angina pectoris: Secondary | ICD-10-CM | POA: Diagnosis present

## 2013-05-31 DIAGNOSIS — B965 Pseudomonas (aeruginosa) (mallei) (pseudomallei) as the cause of diseases classified elsewhere: Secondary | ICD-10-CM | POA: Diagnosis present

## 2013-05-31 DIAGNOSIS — I480 Paroxysmal atrial fibrillation: Secondary | ICD-10-CM | POA: Diagnosis present

## 2013-05-31 DIAGNOSIS — R9430 Abnormal result of cardiovascular function study, unspecified: Secondary | ICD-10-CM

## 2013-05-31 DIAGNOSIS — E876 Hypokalemia: Secondary | ICD-10-CM | POA: Diagnosis present

## 2013-05-31 DIAGNOSIS — I4891 Unspecified atrial fibrillation: Secondary | ICD-10-CM | POA: Diagnosis present

## 2013-05-31 DIAGNOSIS — I5043 Acute on chronic combined systolic (congestive) and diastolic (congestive) heart failure: Secondary | ICD-10-CM | POA: Diagnosis present

## 2013-05-31 LAB — URINE MICROSCOPIC-ADD ON

## 2013-05-31 LAB — URINALYSIS, ROUTINE W REFLEX MICROSCOPIC
Bilirubin Urine: NEGATIVE
Specific Gravity, Urine: 1.015 (ref 1.005–1.030)
pH: 5.5 (ref 5.0–8.0)

## 2013-05-31 LAB — CBC WITH DIFFERENTIAL/PLATELET
Eosinophils Absolute: 0 10*3/uL (ref 0.0–0.7)
Eosinophils Relative: 0 % (ref 0–5)
Hemoglobin: 11.4 g/dL — ABNORMAL LOW (ref 13.0–17.0)
Lymphs Abs: 0.3 10*3/uL — ABNORMAL LOW (ref 0.7–4.0)
MCH: 29.4 pg (ref 26.0–34.0)
MCV: 87.6 fL (ref 78.0–100.0)
Monocytes Relative: 9 % (ref 3–12)
RBC: 3.88 MIL/uL — ABNORMAL LOW (ref 4.22–5.81)

## 2013-05-31 LAB — POCT I-STAT, CHEM 8
BUN: 47 mg/dL — ABNORMAL HIGH (ref 6–23)
Calcium, Ion: 1.13 mmol/L (ref 1.13–1.30)
Creatinine, Ser: 1.7 mg/dL — ABNORMAL HIGH (ref 0.50–1.35)
Sodium: 133 mEq/L — ABNORMAL LOW (ref 135–145)
TCO2: 22 mmol/L (ref 0–100)

## 2013-05-31 MED ORDER — DEXTROSE 5 % IV SOLN
1.0000 g | Freq: Once | INTRAVENOUS | Status: AC
  Administered 2013-06-01: 1 g via INTRAVENOUS
  Filled 2013-05-31: qty 10

## 2013-05-31 MED ORDER — IOHEXOL 350 MG/ML SOLN
80.0000 mL | Freq: Once | INTRAVENOUS | Status: AC | PRN
  Administered 2013-05-31: 80 mL via INTRAVENOUS

## 2013-05-31 NOTE — Telephone Encounter (Signed)
will defer to Dr. Royann Shivers and Levora Angel for answer.

## 2013-05-31 NOTE — ED Notes (Signed)
Patient is resting comfortably. 

## 2013-05-31 NOTE — Telephone Encounter (Signed)
Spoke with pt's daughter who reports they are going to call 911 because pt is so short of breath that he is having trouble talking. He has used inhaler but is still wheezing. Also feet are swollen. I told pt I would notify team at the hospital. He will be transported to [facility]

## 2013-05-31 NOTE — ED Notes (Signed)
Daguhter(Ann) at bedside. [phone]

## 2013-05-31 NOTE — Telephone Encounter (Signed)
New Prob      Daughter calling on behalf of pt. States pt is having SOB and would like to speak to nurse regarding this.

## 2013-05-31 NOTE — ED Notes (Signed)
Per EMS: Pt sent here by family with c/o dizziness, SOB. Pt denies both at this time. Pt does report urinary frequency and dysuria x 2 days. AO x 4. Pt seen here earlier this week to have internal pacemaker removed, external pacemaker placed. Per pt, MD told him that an infection was noted where pacemaker was removed. 132/74. 80 SR. 18 RR. 98% RA.

## 2013-05-31 NOTE — Telephone Encounter (Signed)
Follow up  Pt daughter is calling wanting to speak with a nurse.

## 2013-05-31 NOTE — ED Provider Notes (Signed)
History    CSN: [id number] Arrival date & time 05/31/13  1810  First MD Initiated Contact with Patient 05/31/13 1822     Chief Complaint  Patient presents with  . Urinary Tract Infection   she is obtained from patient's daughter and from patient (Consider location/radiation/quality/duration/timing/severity/associated sxs/prior Treatment) HPI  patient complained of shortness of breath and chills onset 3 days ago. He's shortness of breath improved after he he treated himself with his rescue inhaler. He also complains of burning on urination since being catheterized for urine while in the hospital. He denies cough maximum temperature 99.5. No chest pain. Daughter also reports that his legs have been swollen in his abdomen appears swollen. Patient denies pain anywhere. Denies shortness of breath currently. Also of note is left upper extremity has been swollen and red since pacemaker was removed  days ago, and replace with a temporary external pacemaker. Past Medical History  Diagnosis Date  . Ischemic cardiomyopathy     [employer] Jude BiV ICD  . Paroxysmal atrial fibrillation     sinus rhythm on amiodarone  . Asbestos exposure     plaques on CT  . Compression fracture     T7  . Pacemaker   . Hypertension   . Coronary artery disease   . Arthritis     "little, in my hands" (05/21/2013)  . COPD (chronic obstructive pulmonary disease)     pt denies this hx on 05/21/2013  . Automatic implantable cardioverter-defibrillator in situ   . Hypothyroidism   . CHF (congestive heart failure)     hx.   Past Surgical History  Procedure Laterality Date  . Inguinal hernia repair Right   . Coronary angioplasty      "several through the years; 3 or 4 or 5" (05/21/2013)  . Coronary angioplasty with stent placement      "I have 1 stent" (05/21/2013)  . Coronary artery bypass graft  11/01/1994    SVG to first diagonal, to distal RCA, to the ramus intermedius artery, and LIMA the LAD  . Insert / replace / remove  pacemaker  ?1999  . Cataract extraction w/ intraocular lens  implant, bilateral Bilateral   . Icd lead removal Left 05/27/2013    Procedure: ICD LEAD REMOVAL;  Surgeon: Marinus Maw, MD;  Location: [facility] OR;  Service: Cardiovascular;  Laterality: Left;   Family History  Problem Relation Age of Onset  . Heart disease Father    History  Substance Use Topics  . Smoking status: Never Smoker   . Smokeless tobacco: Never Used  . Alcohol Use: 8.4 oz/week    14 Glasses of wine per week     Comment: 05/21/2013 "couple glasses of wine qd"    Review of Systems  Constitutional: Positive for chills.  HENT: Negative.   Respiratory: Positive for shortness of breath.   Cardiovascular: Positive for leg swelling.  Gastrointestinal: Negative.   Genitourinary: Positive for dysuria.  Musculoskeletal: Negative.   Skin: Negative.   Neurological: Negative.   Psychiatric/Behavioral: Negative.   All other systems reviewed and are negative.    Allergies  Review of patient's allergies indicates no known allergies.  Home Medications   Current Outpatient Rx  Name  Route  Sig  Dispense  Refill  . albuterol (PROVENTIL HFA;VENTOLIN HFA) 108 (90 BASE) MCG/ACT inhaler   Inhalation   Inhale 2 puffs into the lungs every 6 (six) hours as needed for wheezing or shortness of breath.         Marland Kitchen  bimatoprost (LUMIGAN) 0.03 % ophthalmic solution   Both Eyes   Place 1 drop into both eyes at bedtime.          . budesonide-formoterol (SYMBICORT) 160-4.5 MCG/ACT inhaler   Inhalation   Inhale 1 puff into the lungs 2 (two) times daily.         . captopril (CAPOTEN) 25 MG tablet   Oral   Take 0.5 tablets (12.5 mg total) by mouth 2 (two) times daily.   90 tablet   2   . Coenzyme Q10 (COQ10) 100 MG CAPS   Oral   Take 100 mg by mouth daily.          . COMBIGAN 0.2-0.5 % ophthalmic solution   Both Eyes   Place 1 drop into both eyes Twice daily.         . digoxin (LANOXIN) 0.125 MG tablet   Oral    Take 125 mcg by mouth daily.           . dorzolamide (TRUSOPT) 2 % ophthalmic solution   Left Eye   Place 1 drop into the left eye Twice daily.         . fish oil-omega-3 fatty acids 1000 MG capsule   Oral   Take 2 g by mouth daily.         . folic acid-pyridoxine-cyancobalamin (FOLTX) 2.5-25-2 MG TABS   Oral   Take 1 tablet by mouth daily.         . furosemide (LASIX) 80 MG tablet   Oral   Take 80 mg by mouth daily.           Marland Kitchen KLOR-CON 10 10 MEQ CR tablet   Oral   Take 10 mEq by mouth daily.          Marland Kitchen levothyroxine (SYNTHROID, LEVOTHROID) 125 MCG tablet   Oral   Take 125 mcg by mouth daily before breakfast.         . magnesium oxide (MAG-OX) 400 MG tablet   Oral   Take 400 mg by mouth daily.         . vitamin E 400 UNIT capsule   Oral   Take 400 Units by mouth daily.           Marland Kitchen warfarin (COUMADIN) 5 MG tablet      As directed. RESTART on Sat 06/01/2013 - Take 1/2 tablet (2.5 mg) once daily on Sat, Mon, Wed, Fri. Take 1 tablet (5 mg) once daily on Sun, Tues, Thurs.          BP 120/68  Pulse 80  Resp 29  SpO2 100% Physical Exam  Nursing note and vitals reviewed. Constitutional: He appears well-developed and well-nourished.  HENT:  Head: Normocephalic and atraumatic.  Eyes: Conjunctivae are normal. Pupils are equal, round, and reactive to light.  Neck: Neck supple. No tracheal deviation present. No thyromegaly present.  Cardiovascular: Normal rate and regular rhythm.   No murmur heard. Pulmonary/Chest: Effort normal and breath sounds normal.  External pacemaker left chest wall  Abdominal: Soft. Bowel sounds are normal. He exhibits no distension. There is no tenderness.  Genitourinary: Penis normal.  Musculoskeletal: Normal range of motion. He exhibits edema. He exhibits no tenderness.  Left upper extremity is ecchymotic and swollen diffusely. Radial pulse 2+.  Neurological: He is alert. Coordination normal.  Skin: Skin is warm and dry.  No rash noted.  Psychiatric: He has a normal mood and affect.    ED Course  Procedures (  including critical care time) Labs Reviewed - No data to display No results found. No diagnosis found.  Date: 05/31/2013  Rate: 80  Rhythm: Ventricular pacemaker rhythm  QRS Axis: left  Intervals: normal  ST/T Wave abnormalities: nonspecific T wave changes  Conduction Disutrbances:left bundle branch block  Narrative Interpretation:   Old EKG Reviewed: Unchanged from 05/28/2013 interpreted by me Results for orders placed during the hospital encounter of 05/31/13  URINALYSIS, ROUTINE W REFLEX MICROSCOPIC      Result Value Range   Color, Urine AMBER (*) YELLOW   APPearance CLOUDY (*) CLEAR   Specific Gravity, Urine 1.015  1.005 - 1.030   pH 5.5  5.0 - 8.0   Glucose, UA NEGATIVE  NEGATIVE mg/dL   Hgb urine dipstick MODERATE (*) NEGATIVE   Bilirubin Urine NEGATIVE  NEGATIVE   Ketones, ur NEGATIVE  NEGATIVE mg/dL   Protein, ur 30 (*) NEGATIVE mg/dL   Urobilinogen, UA 0.2  0.0 - 1.0 mg/dL   Nitrite NEGATIVE  NEGATIVE   Leukocytes, UA MODERATE (*) NEGATIVE  CBC WITH DIFFERENTIAL      Result Value Range   WBC 11.7 (*) 4.0 - 10.5 K/uL   RBC 3.88 (*) 4.22 - 5.81 MIL/uL   Hemoglobin 11.4 (*) 13.0 - 17.0 g/dL   HCT 81.1 (*) 91.4 - 78.2 %   MCV 87.6  78.0 - 100.0 fL   MCH 29.4  26.0 - 34.0 pg   MCHC 33.5  30.0 - 36.0 g/dL   RDW 95.6 (*) 21.3 - 08.6 %   Platelets 119 (*) 150 - 400 K/uL   Neutrophils Relative % 88 (*) 43 - 77 %   Neutro Abs 10.3 (*) 1.7 - 7.7 K/uL   Lymphocytes Relative 3 (*) 12 - 46 %   Lymphs Abs 0.3 (*) 0.7 - 4.0 K/uL   Monocytes Relative 9  3 - 12 %   Monocytes Absolute 1.1 (*) 0.1 - 1.0 K/uL   Eosinophils Relative 0  0 - 5 %   Eosinophils Absolute 0.0  0.0 - 0.7 K/uL   Basophils Relative 0  0 - 1 %   Basophils Absolute 0.0  0.0 - 0.1 K/uL  PRO B NATRIURETIC PEPTIDE      Result Value Range   Pro B Natriuretic peptide (BNP) 8821.0 (*) 0 - 450 pg/mL  URINE  MICROSCOPIC-ADD ON      Result Value Range   Squamous Epithelial / LPF FEW (*) RARE   WBC, UA 21-50  <3 WBC/hpf   RBC / HPF 3-6  <3 RBC/hpf   Bacteria, UA FEW (*) RARE   Casts HYALINE CASTS (*) NEGATIVE   Urine-Other SPERM PRESENT    POCT I-STAT, CHEM 8      Result Value Range   Sodium 133 (*) 135 - 145 mEq/L   Potassium 5.2 (*) 3.5 - 5.1 mEq/L   Chloride 100  96 - 112 mEq/L   BUN 47 (*) 6 - 23 mg/dL   Creatinine, Ser 5.78 (*) 0.50 - 1.35 mg/dL   Glucose, Bld 469 (*) 70 - 99 mg/dL   Calcium, Ion 6.29  5.28 - 1.30 mmol/L   TCO2 22  0 - 100 mmol/L   Hemoglobin 11.9 (*) 13.0 - 17.0 g/dL   HCT 41.3 (*) 24.4 - 01.0 %   Dg Chest 2 View  05/28/2013   *RADIOLOGY REPORT*  Clinical Data: Pacemaker lead extraction  CHEST - 2 VIEW  Comparison: Chest x-ray of 05/21/2013  Findings: There is been  a change in the pacemaker and all of prior leads appear to have been removed with a single lead now remaining. The lungs are well aerated.  However there is cardiomegaly present with small effusions and mild pulmonary vascular congestion. Median sternotomy sutures are noted.  A compressed mid thoracic vertebral body is unchanged. Old left lateral rib fractures are noted.  IMPRESSION:  1.  Change of pacemaker with single lead pacer now present. 2.  Cardiomegaly and mild pulmonary vascular congestion with small effusions.   Original Report Authenticated By: Dwyane Dee, M.D.   Dg Chest 2 View  05/21/2013   *RADIOLOGY REPORT*  Clinical Data: Pacemaker placement  CHEST - 2 VIEW  Comparison: 10/22/2012  Findings: Cardiomegaly again noted.  Four leads cardiac pacemaker is unchanged in position.  No acute infiltrate or pleural effusion. No pulmonary edema.  Bony thorax is stable.  IMPRESSION: No active disease.  Stable four leads cardiac pacemaker position.   Original Report Authenticated By: Natasha Mead, M.D.   Ct Angio Chest Pe W/cm &/or Wo Cm  05/31/2013   *RADIOLOGY REPORT*  Clinical Data: Recent AICD lead removal.   History of a cardiomyopathy.  CT ANGIOGRAPHY CHEST  Technique:  Multidetector CT imaging of the chest using the standard protocol during bolus administration of intravenous contrast. Multiplanar reconstructed images including MIPs were obtained and reviewed to evaluate the vascular anatomy.  Contrast: 80mL OMNIPAQUE IOHEXOL 350 MG/ML SOLN  Comparison: Chest radiograph, 05/28/2013  Findings: No evidence of a pulmonary embolus.  There is moderate enlargement of the heart.  There are dense coronary artery calcifications and changes from previous CABG surgery.  No mediastinal or hilar masses are appreciated and there are no pathologically enlarged lymph nodes.  There is lung base subsegmental atelectasis.  There are mild areas of peripheral interstitial thickening which appear chronic.  No infiltrate or pulmonary edema.  There is no pleural effusion or pneumothorax.  Limited evaluation of the upper abdomen is unremarkable.  There is a burst type fracture of T7, which appears old.  This was present on the prior chest radiograph.  IMPRESSION: No evidence of a pulmonary embolus.  Moderate cardiomegaly.  Mild lung base subsegmental atelectasis. No edema or infiltrate.   Original Report Authenticated By: Amie Portland, M.D.    11:45pm patient is resting comfortably. Denies shortness of breath  MDM  Spoke with Dr.Patel plan admission, telemetry, antibiotics Diagnosis #1 urinary tract infection 2 renal insufficiency #3 hyperglycemia   Doug Sou, MD 06/01/13 0003

## 2013-06-01 ENCOUNTER — Encounter (HOSPITAL_COMMUNITY): Payer: Self-pay | Admitting: Internal Medicine

## 2013-06-01 DIAGNOSIS — I2510 Atherosclerotic heart disease of native coronary artery without angina pectoris: Secondary | ICD-10-CM

## 2013-06-01 DIAGNOSIS — N390 Urinary tract infection, site not specified: Secondary | ICD-10-CM | POA: Diagnosis present

## 2013-06-01 DIAGNOSIS — M7989 Other specified soft tissue disorders: Secondary | ICD-10-CM

## 2013-06-01 DIAGNOSIS — Z95810 Presence of automatic (implantable) cardiac defibrillator: Secondary | ICD-10-CM

## 2013-06-01 DIAGNOSIS — I82629 Acute embolism and thrombosis of deep veins of unspecified upper extremity: Secondary | ICD-10-CM | POA: Diagnosis present

## 2013-06-01 DIAGNOSIS — I5043 Acute on chronic combined systolic (congestive) and diastolic (congestive) heart failure: Secondary | ICD-10-CM | POA: Diagnosis present

## 2013-06-01 DIAGNOSIS — R0989 Other specified symptoms and signs involving the circulatory and respiratory systems: Secondary | ICD-10-CM

## 2013-06-01 LAB — COMPREHENSIVE METABOLIC PANEL
ALT: 120 U/L — ABNORMAL HIGH (ref 0–53)
AST: 220 U/L — ABNORMAL HIGH (ref 0–37)
CO2: 23 mEq/L (ref 19–32)
Calcium: 9.3 mg/dL (ref 8.4–10.5)
Creatinine, Ser: 1.47 mg/dL — ABNORMAL HIGH (ref 0.50–1.35)
GFR calc non Af Amer: 42 mL/min — ABNORMAL LOW (ref >90)
Sodium: 132 mEq/L — ABNORMAL LOW (ref 135–145)
Total Protein: 7 g/dL (ref 6.0–8.3)

## 2013-06-01 LAB — CBC
Hemoglobin: 11.2 g/dL — ABNORMAL LOW (ref 13.0–17.0)
MCH: 29.5 pg (ref 26.0–34.0)
MCHC: 33.2 g/dL (ref 30.0–36.0)

## 2013-06-01 LAB — TSH: TSH: 0.718 u[IU]/mL (ref 0.350–4.500)

## 2013-06-01 LAB — PRO B NATRIURETIC PEPTIDE: Pro B Natriuretic peptide (BNP): 9189 pg/mL — ABNORMAL HIGH (ref 0–450)

## 2013-06-01 LAB — PROTIME-INR: INR: 1.32 (ref 0.00–1.49)

## 2013-06-01 MED ORDER — WARFARIN SODIUM 2.5 MG PO TABS: 2.5000 mg | ORAL_TABLET | Freq: Once | ORAL | Status: DC

## 2013-06-01 MED ORDER — SODIUM POLYSTYRENE SULFONATE 15 GM/60ML PO SUSP
15.0000 g | Freq: Once | ORAL | Status: AC
  Administered 2013-06-01: 15 g via ORAL
  Filled 2013-06-01: qty 60

## 2013-06-01 MED ORDER — HEPARIN SODIUM (PORCINE) 5000 UNIT/ML IJ SOLN: 5000.0000 [IU] | Freq: Three times a day (TID) | INTRAMUSCULAR | Status: DC

## 2013-06-01 MED ORDER — WARFARIN SODIUM 5 MG PO TABS
5.0000 mg | ORAL_TABLET | Freq: Once | ORAL | Status: AC
  Administered 2013-06-01: 5 mg via ORAL
  Filled 2013-06-01: qty 1

## 2013-06-01 MED ORDER — FA-PYRIDOXINE-CYANOCOBALAMIN 2.5-25-2 MG PO TABS
1.0000 | ORAL_TABLET | Freq: Every day | ORAL | Status: DC
  Administered 2013-06-01 – 2013-06-04 (×4): 1 via ORAL
  Filled 2013-06-01 (×4): qty 1

## 2013-06-01 MED ORDER — HEPARIN BOLUS VIA INFUSION
4000.0000 [IU] | Freq: Once | INTRAVENOUS | Status: AC
  Administered 2013-06-01: 4000 [IU] via INTRAVENOUS
  Filled 2013-06-01: qty 4000

## 2013-06-01 MED ORDER — SODIUM CHLORIDE 0.9 % IJ SOLN
3.0000 mL | Freq: Two times a day (BID) | INTRAMUSCULAR | Status: DC
  Administered 2013-06-01 – 2013-06-04 (×4): 3 mL via INTRAVENOUS

## 2013-06-01 MED ORDER — TIMOLOL MALEATE 0.5 % OP SOLN
1.0000 [drp] | Freq: Two times a day (BID) | OPHTHALMIC | Status: DC
  Administered 2013-06-01 – 2013-06-04 (×6): 1 [drp] via OPHTHALMIC
  Filled 2013-06-01: qty 5

## 2013-06-01 MED ORDER — DORZOLAMIDE HCL 2 % OP SOLN
1.0000 [drp] | Freq: Two times a day (BID) | OPHTHALMIC | Status: DC
  Administered 2013-06-01 – 2013-06-04 (×6): 1 [drp] via OPHTHALMIC
  Filled 2013-06-01: qty 10

## 2013-06-01 MED ORDER — BUDESONIDE-FORMOTEROL FUMARATE 160-4.5 MCG/ACT IN AERO
1.0000 | INHALATION_SPRAY | Freq: Two times a day (BID) | RESPIRATORY_TRACT | Status: DC
  Administered 2013-06-01 – 2013-06-04 (×6): 1 via RESPIRATORY_TRACT
  Filled 2013-06-01: qty 6

## 2013-06-01 MED ORDER — FUROSEMIDE 40 MG PO TABS
40.0000 mg | ORAL_TABLET | Freq: Every day | ORAL | Status: DC
  Filled 2013-06-01: qty 1

## 2013-06-01 MED ORDER — WARFARIN - PHARMACIST DOSING INPATIENT
Freq: Every day | Status: DC
  Administered 2013-06-01 – 2013-06-02 (×2)

## 2013-06-01 MED ORDER — TAMSULOSIN HCL 0.4 MG PO CAPS
0.4000 mg | ORAL_CAPSULE | Freq: Every day | ORAL | Status: DC
  Administered 2013-06-01 – 2013-06-04 (×4): 0.4 mg via ORAL
  Filled 2013-06-01 (×4): qty 1

## 2013-06-01 MED ORDER — CAPTOPRIL 12.5 MG PO TABS
12.5000 mg | ORAL_TABLET | Freq: Two times a day (BID) | ORAL | Status: DC
  Administered 2013-06-01: 12.5 mg via ORAL
  Filled 2013-06-01 (×2): qty 1

## 2013-06-01 MED ORDER — BRIMONIDINE TARTRATE 0.2 % OP SOLN
1.0000 [drp] | Freq: Two times a day (BID) | OPHTHALMIC | Status: DC
  Administered 2013-06-01 – 2013-06-04 (×6): 1 [drp] via OPHTHALMIC
  Filled 2013-06-01: qty 5

## 2013-06-01 MED ORDER — ALBUTEROL SULFATE HFA 108 (90 BASE) MCG/ACT IN AERS: 2.0000 | INHALATION_SPRAY | Freq: Four times a day (QID) | RESPIRATORY_TRACT | Status: DC | PRN

## 2013-06-01 MED ORDER — DEXTROSE 5 % IV SOLN
1.0000 g | INTRAVENOUS | Status: DC
  Administered 2013-06-01: 1 g via INTRAVENOUS
  Filled 2013-06-01 (×2): qty 10

## 2013-06-01 MED ORDER — FUROSEMIDE 10 MG/ML IJ SOLN
80.0000 mg | Freq: Once | INTRAMUSCULAR | Status: AC
  Administered 2013-06-01: 80 mg via INTRAVENOUS
  Filled 2013-06-01: qty 8

## 2013-06-01 MED ORDER — HEPARIN (PORCINE) IN NACL 100-0.45 UNIT/ML-% IJ SOLN
1400.0000 [IU]/h | INTRAMUSCULAR | Status: AC
  Administered 2013-06-01: 1250 [IU]/h via INTRAVENOUS
  Administered 2013-06-02 – 2013-06-03 (×2): 1400 [IU]/h via INTRAVENOUS
  Filled 2013-06-01 (×4): qty 250

## 2013-06-01 MED ORDER — LEVOTHYROXINE SODIUM 125 MCG PO TABS
125.0000 ug | ORAL_TABLET | Freq: Every day | ORAL | Status: DC
  Administered 2013-06-01 – 2013-06-04 (×4): 125 ug via ORAL
  Filled 2013-06-01 (×6): qty 1

## 2013-06-01 MED ORDER — DIGOXIN 125 MCG PO TABS
125.0000 ug | ORAL_TABLET | Freq: Every day | ORAL | Status: DC
  Administered 2013-06-01 – 2013-06-02 (×2): 125 ug via ORAL
  Filled 2013-06-01 (×3): qty 1

## 2013-06-01 NOTE — Progress Notes (Signed)
06/01/2013 patient daughter was concern patient was not voiding enough. Bladder scan was done at 1700 and he had 372 cc. Before then patient voided 200 cc. Dr Gonzella Lex is aware and per MD continue to monitor, and do a bladder scan tonight if needed. [facility] RN.

## 2013-06-01 NOTE — Progress Notes (Signed)
ANTICOAGULATION CONSULT NOTE - Initial Consult  Pharmacy Consult for Heparin  Indication: DVT  No Known Allergies  Patient Measurements: Height: 5\' 11"  (180.3 cm) Weight: 176 lb 6.4 oz (80.015 kg) IBW/kg (Calculated) : 75.3 Heparin Dosing Weight: 80 kg  Vital Signs: Temp: 97.5 F (36.4 C) (07/19 0946) Temp src: Oral (07/19 0946) BP: 123/86 mmHg (07/19 0946) Pulse Rate: 80 (07/19 0946)  Labs:  Recent Labs  05/31/13 1951 05/31/13 2001 06/01/13 0400 06/01/13 0430  HGB 11.4* 11.9* 11.2*  --   HCT 34.0* 35.0* 33.7*  --   PLT 119*  --  124*  --   LABPROT  --   --   --  16.1*  INR  --   --   --  1.32  CREATININE  --  1.70* 1.47*  --     Estimated Creatinine Clearance: 39.1 ml/min (by C-G formula based on Cr of 1.47).   Medical History: Past Medical History  Diagnosis Date  . Ischemic cardiomyopathy     [employer]. Jude BiV ICD  . Paroxysmal atrial fibrillation     sinus rhythm on amiodarone  . Asbestos exposure     plaques on CT  . Compression fracture     T7  . Pacemaker   . Hypertension   . Coronary artery disease   . Arthritis     "little, in my hands" (05/21/2013)  . COPD (chronic obstructive pulmonary disease)     pt denies this hx on 05/21/2013  . Automatic implantable cardioverter-defibrillator in situ   . Hypothyroidism   . CHF (congestive heart failure)     hx.    Medications:  Scheduled:  . budesonide-formoterol  1 puff Inhalation BID  . cefTRIAXone (ROCEPHIN)  IV  1 g Intravenous Q24H  . digoxin  125 mcg Oral Daily  . folic acid-pyridoxine-cyancobalamin  1 tablet Oral Daily  . [START ON 06/02/2013] furosemide  40 mg Oral Daily  . levothyroxine  125 mcg Oral QAC breakfast  . sodium chloride  3 mL Intravenous Q12H  . warfarin  5 mg Oral ONCE-1800  . Warfarin - Pharmacist Dosing Inpatient   Does not apply q1800    Assessment: 79 yr old male on heparin bridge for DVT in the left internal jugular vein.  Patient is on chronic coumadin therapy at home,  doses held for ICD extraction, last dose on 7/9-pharmacy dosing coumadin as well.   INR today is 1.32.  Goal of Therapy:  Heparin level 0.3-0.7 units/ml Monitor platelets by anticoagulation protocol: Yes   Plan:  Heparin 4000 units IV bolus x 1, then start heparin at 1250 units/hr. Check Heparin level 8 hours after drip start. Daily Heparin level/cbc.  Wendie Simmer, PharmD, BCPS Clinical Pharmacist  Pager: [phone]

## 2013-06-01 NOTE — Progress Notes (Signed)
Attempted in and out catheter per MD order.  No resistance,  no urine return, and very painful for patient.  Benedetto Coons, NP notified.  Steele Berg RN

## 2013-06-01 NOTE — Progress Notes (Signed)
Late Entry:  Pt admitted to floor placed on tele. Pt comes from Home alone. Pt is AOx4, amb with standby assist.  Pt oriented to unit, staff, and safety measures.

## 2013-06-01 NOTE — Progress Notes (Signed)
[facility] PROGRESS NOTE  ANTUAN LIMES ZOX:[id number] DOB: [birth year] DOA: 05/31/2013 PCP: Margaree Mackintosh, MD  Assessment/Plan:  UTI Follow cx. Continue IV rocephin Tylenol prn for fever    left upper arm swelling, right leg swelling  The patient mentions that both swelling has been new since recent procedure to remove infected AICD. Check doppler to r/o DVT. No signs of infection - not taking his warfarin since last few days  -CT angio chest was negative for PE.    AKI  possibly from UTI and over diuresis  hold captopril. Reduce dose of lasix Monitor in am   CHF and CAD  - history of significant CHF with low ejection fraction.  reduce lasix dose given AKI Daily I.'s and O. daily weight  Continue digoxin . If renal fn worsens will hold digoxin Appreciate cardiology recs  .Hypothyroidism  Continue Synthroid check TSH   Recent extraction of AICD He currently has a temporary pacemaker  Hyperkalemia  ordered kayexalate   DVT Prophylaxis: Heparin  Nutrition: Fluid and salt restriction  Code Status: Full     Code Status: *full Family Communication: none at bedside Disposition Plan: home once stable   Consultants:  [facility]  Procedures:  none  Antibiotics:  rocephin  HPI/Subjective: Admission H7P reviewed  Objective: Filed Vitals:   06/01/13 0113 06/01/13 0538 06/01/13 0831 06/01/13 0946  BP: 120/66 117/69  123/86  Pulse: 80 81  80  Temp: 97.8 F (36.6 C) 98.5 F (36.9 C)  97.5 F (36.4 C)  TempSrc: Oral Oral  Oral  Resp: 20 18  18   Height: 5\' 11"  (1.803 m)     Weight: 80.015 kg (176 lb 6.4 oz)     SpO2: 96% 99% 97% 97%    Intake/Output Summary (Last 24 hours) at 06/01/13 1434 Last data filed at 06/01/13 0900  Gross per 24 hour  Intake    120 ml  Output     75 ml  Net     45 ml   Filed Weights   06/01/13 0113  Weight: 80.015 kg (176 lb 6.4 oz)    Exam:   General:  Elderly male in NAD  HEENT: no pallor, moist oral  mucosa  Chest: clear breath sounds b/l, no added sounds  Cardiovascular: NS1&S2, no murmurs. Pacemaker site clean  Abdomen: soft, NT, ND, BS+  Musculoskeletal: ext; swelling over left UE. Trace LE edema  CNS: aaox3  Data Reviewed: Basic Metabolic Panel:  Recent Labs Lab 05/28/13 0800 05/29/13 0120 05/31/13 2001 06/01/13 0400  NA 136 134* 133* 132*  K 4.2 4.0 5.2* 5.2*  CL 101 99 100 96  CO2 24 26  --  23  GLUCOSE 159* 139* 154* 132*  BUN 27* 31* 47* 52*  CREATININE 1.13 1.30 1.70* 1.47*  CALCIUM 8.7 8.5  --  9.3   Liver Function Tests:  Recent Labs Lab 06/01/13 0400  AST 220*  ALT 120*  ALKPHOS 107  BILITOT 1.4*  PROT 7.0  ALBUMIN 3.4*   No results found for this basename: LIPASE, AMYLASE,  in the last 168 hours No results found for this basename: AMMONIA,  in the last 168 hours CBC:  Recent Labs Lab 05/28/13 0800 05/29/13 0120 05/31/13 1951 05/31/13 2001 06/01/13 0400  WBC 5.7 5.8 11.7*  --  11.7*  NEUTROABS  --   --  10.3*  --   --   HGB 11.3* 10.7* 11.4* 11.9* 11.2*  HCT 34.5* 31.4* 34.0* 35.0* 33.7*  MCV 88.9 87.5  87.6  --  88.7  PLT 102* 89* 119*  --  124*   Cardiac Enzymes: No results found for this basename: CKTOTAL, CKMB, CKMBINDEX, TROPONINI,  in the last 168 hours BNP (last 3 results)  Recent Labs  05/31/13 1951 06/01/13 0430  PROBNP 8821.0* 9189.0*   CBG: No results found for this basename: GLUCAP,  in the last 168 hours  Recent Results (from the past 240 hour(s))  MRSA PCR SCREENING     Status: None   Collection Time    05/27/13  8:42 PM      Result Value Range Status   MRSA by PCR NEGATIVE  NEGATIVE Final   Comment:            The GeneXpert MRSA Assay (FDA     approved for NASAL specimens     only), is one component of a     comprehensive MRSA colonization     surveillance program. It is not     intended to diagnose MRSA     infection nor to guide or     monitor treatment for     MRSA infections.     Studies: Ct  Angio Chest Pe W/cm &/or Wo Cm  05/31/2013   *RADIOLOGY REPORT*  Clinical Data: Recent AICD lead removal.  History of a cardiomyopathy.  CT ANGIOGRAPHY CHEST  Technique:  Multidetector CT imaging of the chest using the standard protocol during bolus administration of intravenous contrast. Multiplanar reconstructed images including MIPs were obtained and reviewed to evaluate the vascular anatomy.  Contrast: 80mL OMNIPAQUE IOHEXOL 350 MG/ML SOLN  Comparison: Chest radiograph, 05/28/2013  Findings: No evidence of a pulmonary embolus.  There is moderate enlargement of the heart.  There are dense coronary artery calcifications and changes from previous CABG surgery.  No mediastinal or hilar masses are appreciated and there are no pathologically enlarged lymph nodes.  There is lung base subsegmental atelectasis.  There are mild areas of peripheral interstitial thickening which appear chronic.  No infiltrate or pulmonary edema.  There is no pleural effusion or pneumothorax.  Limited evaluation of the upper abdomen is unremarkable.  There is a burst type fracture of T7, which appears old.  This was present on the prior chest radiograph.  IMPRESSION: No evidence of a pulmonary embolus.  Moderate cardiomegaly.  Mild lung base subsegmental atelectasis. No edema or infiltrate.   Original Report Authenticated By: Amie Portland, M.D.    Scheduled Meds: . budesonide-formoterol  1 puff Inhalation BID  . captopril  12.5 mg Oral BID  . cefTRIAXone (ROCEPHIN)  IV  1 g Intravenous Q24H  . digoxin  125 mcg Oral Daily  . folic acid-pyridoxine-cyancobalamin  1 tablet Oral Daily  . [START ON 06/02/2013] furosemide  40 mg Oral Daily  . levothyroxine  125 mcg Oral QAC breakfast  . sodium chloride  3 mL Intravenous Q12H  . warfarin  5 mg Oral ONCE-1800  . Warfarin - Pharmacist Dosing Inpatient   Does not apply q1800   Continuous Infusions:    Time spent:25 minutes    Chiyoko Torrico  [facility] Pager [phone]  If 7PM-7AM, please contact night-coverage at [email or web address], password [facility] 06/01/2013, 2:34 PM  LOS: 1 day

## 2013-06-01 NOTE — Progress Notes (Signed)
  ANTICOAGULATION CONSULT NOTE - Follow Up Consult  Pharmacy Consult for Coumadin Indication: atrial fibrillation  No Known Allergies  Patient Measurements: Height: 5\' 11"  (180.3 cm) Weight: 176 lb 6.4 oz (80.015 kg) IBW/kg (Calculated) : 75.3  Vital Signs: Temp: 97.5 F (36.4 C) (07/19 0946) Temp src: Oral (07/19 0946) BP: 123/86 mmHg (07/19 0946) Pulse Rate: 80 (07/19 0946)  Labs:  Recent Labs  05/31/13 1951 05/31/13 2001 06/01/13 0400 06/01/13 0430  HGB 11.4* 11.9* 11.2*  --   HCT 34.0* 35.0* 33.7*  --   PLT 119*  --  124*  --   LABPROT  --   --   --  16.1*  INR  --   --   --  1.32  CREATININE  --  1.70* 1.47*  --     Estimated Creatinine Clearance: 39.1 ml/min (by C-G formula based on Cr of 1.47).   Medications:  Scheduled:  . budesonide-formoterol  1 puff Inhalation BID  . captopril  12.5 mg Oral BID  . cefTRIAXone (ROCEPHIN)  IV  1 g Intravenous Q24H  . digoxin  125 mcg Oral Daily  . folic acid-pyridoxine-cyancobalamin  1 tablet Oral Daily  . [START ON 06/02/2013] furosemide  40 mg Oral Daily  . levothyroxine  125 mcg Oral QAC breakfast  . sodium chloride  3 mL Intravenous Q12H  . Warfarin - Pharmacist Dosing Inpatient   Does not apply q1800    Assessment: 79 yo M on chromic coumadin at home (5mg  Tu,Th,Su, and 2.5 all other days). Held coumadin for ICD extraction(7/14), w/ plan to resume today. Last warfarin 5mg  taken 7/9. Coum pts 6. No reports of bleed. Hgb at baseline, and plt slowly rising to 124.    Goal of Therapy:  INR 2-3 Monitor platelets by anticoagulation protocol: Yes   Plan:  1. Start Coumadin 5mg  PO x1 2. Monitor INR, CBC, and s/s of bleed  Forestine Na M 06/01/2013,2:24 PM

## 2013-06-01 NOTE — Consult Note (Signed)
[facility]       CONSULTATION NOTE   Reason for Consult: CHF  Requesting Physician: Allena Katz  Cardiologist: Alanda Amass  HPI: This is a 79 y.o. male with a past medical history significant for long-standing congestive heart failure due to severe ischemic cardiomyopathy, which in the past has been well compensated. He has recently undergone extraction of a CRT-D device secondary to device extrusion, possibly due to smoldering low-grade infection. He has complete heart block and is pacemaker dependent, currently with a "temporary permanent" right ventricular pacemaker via the left internal jugular vein. His device was extracted on July 14. He presents now with hematuria, dysuria, shortness of breath and swelling of the left upper extremity and mild swelling of the left lower extremity.  PMHx:  Past Medical History  Diagnosis Date  . Ischemic cardiomyopathy     St. Jude BiV ICD  . Paroxysmal atrial fibrillation     sinus rhythm on amiodarone  . Asbestos exposure     plaques on CT  . Compression fracture     T7  . Pacemaker   . Hypertension   . Coronary artery disease   . Arthritis     "little, in my hands" (05/21/2013)  . COPD (chronic obstructive pulmonary disease)     pt denies this hx on 05/21/2013  . Automatic implantable cardioverter-defibrillator in situ   . Hypothyroidism   . CHF (congestive heart failure)     hx.   Past Surgical History  Procedure Laterality Date  . Inguinal hernia repair Right   . Coronary angioplasty      "several through the years; 3 or 4 or 5" (05/21/2013)  . Coronary angioplasty with stent placement      "I have 1 stent" (05/21/2013)  . Coronary artery bypass graft  11/01/1994    SVG to first diagonal, to distal RCA, to the ramus intermedius artery, and LIMA the LAD  . Insert / replace / remove pacemaker  ?1999  . Cataract extraction w/ intraocular lens  implant, bilateral Bilateral   . Icd lead removal Left 05/27/2013     Procedure: ICD LEAD REMOVAL;  Surgeon: Marinus Maw, MD;  Location: [facility] OR;  Service: Cardiovascular;  Laterality: Left;    FAMHx: Family History  Problem Relation Age of Onset  . Heart disease Father     SOCHx:  reports that he has never smoked. He has never used smokeless tobacco. He reports that he drinks about 8.4 ounces of alcohol per week. He reports that he does not use illicit drugs.  ALLERGIES: No Known Allergies  ROS: Constitutional: negative for chills, fevers and night sweats Eyes: negative Ears, nose, mouth, throat, and face: negative for epistaxis, hearing loss and sore throat Respiratory: positive for wheezing and Shortness of breath at rest and orthopnea Cardiovascular: positive for orthopnea, negative for chest pressure/discomfort and syncope Gastrointestinal: negative for abdominal pain, change in bowel habits, melena, nausea and vomiting Genitourinary:positive for dysuria and hematuria Hematologic/lymphatic: negative for bleeding and easy bruising Neurological: negative for speech problems and weakness Behavioral/Psych: negative Endocrine: negative  HOME MEDICATIONS: Prescriptions prior to admission  Medication Sig Dispense Refill  . albuterol (PROVENTIL HFA;VENTOLIN HFA) 108 (90 BASE) MCG/ACT inhaler Inhale 2 puffs into the lungs every 6 (six) hours as needed for wheezing or shortness of breath.      . bimatoprost (LUMIGAN) 0.03 % ophthalmic solution Place 1 drop into both eyes at bedtime.       Marland Kitchen  budesonide-formoterol (SYMBICORT) 160-4.5 MCG/ACT inhaler Inhale 1 puff into the lungs 2 (two) times daily.      . captopril (CAPOTEN) 25 MG tablet Take 0.5 tablets (12.5 mg total) by mouth 2 (two) times daily.  90 tablet  2  . Coenzyme Q10 (COQ10) 100 MG CAPS Take 100 mg by mouth daily.       . COMBIGAN 0.2-0.5 % ophthalmic solution Place 1 drop into both eyes Twice daily.      . digoxin (LANOXIN) 0.125 MG tablet Take 125 mcg by mouth daily.        .  dorzolamide (TRUSOPT) 2 % ophthalmic solution Place 1 drop into the left eye Twice daily.      . fish oil-omega-3 fatty acids 1000 MG capsule Take 2 g by mouth daily.      . folic acid-pyridoxine-cyancobalamin (FOLTX) 2.5-25-2 MG TABS Take 1 tablet by mouth daily.      . furosemide (LASIX) 80 MG tablet Take 80 mg by mouth daily.        Marland Kitchen KLOR-CON 10 10 MEQ CR tablet Take 10 mEq by mouth daily.       Marland Kitchen levothyroxine (SYNTHROID, LEVOTHROID) 125 MCG tablet Take 125 mcg by mouth daily before breakfast.      . magnesium oxide (MAG-OX) 400 MG tablet Take 400 mg by mouth daily.      . vitamin E 400 UNIT capsule Take 400 Units by mouth daily.        Marland Kitchen warfarin (COUMADIN) 5 MG tablet As directed. RESTART on Sat 06/01/2013 - Take 1/2 tablet (2.5 mg) once daily on Sat, Mon, Wed, Fri. Take 1 tablet (5 mg) once daily on Sun, Tues, Thurs.        HOSPITAL MEDICATIONS: Prior to Admission:  Prescriptions prior to admission  Medication Sig Dispense Refill  . albuterol (PROVENTIL HFA;VENTOLIN HFA) 108 (90 BASE) MCG/ACT inhaler Inhale 2 puffs into the lungs every 6 (six) hours as needed for wheezing or shortness of breath.      . bimatoprost (LUMIGAN) 0.03 % ophthalmic solution Place 1 drop into both eyes at bedtime.       . budesonide-formoterol (SYMBICORT) 160-4.5 MCG/ACT inhaler Inhale 1 puff into the lungs 2 (two) times daily.      . captopril (CAPOTEN) 25 MG tablet Take 0.5 tablets (12.5 mg total) by mouth 2 (two) times daily.  90 tablet  2  . Coenzyme Q10 (COQ10) 100 MG CAPS Take 100 mg by mouth daily.       . COMBIGAN 0.2-0.5 % ophthalmic solution Place 1 drop into both eyes Twice daily.      . digoxin (LANOXIN) 0.125 MG tablet Take 125 mcg by mouth daily.        . dorzolamide (TRUSOPT) 2 % ophthalmic solution Place 1 drop into the left eye Twice daily.      . fish oil-omega-3 fatty acids 1000 MG capsule Take 2 g by mouth daily.      . folic acid-pyridoxine-cyancobalamin (FOLTX) 2.5-25-2 MG TABS Take 1  tablet by mouth daily.      . furosemide (LASIX) 80 MG tablet Take 80 mg by mouth daily.        Marland Kitchen KLOR-CON 10 10 MEQ CR tablet Take 10 mEq by mouth daily.       Marland Kitchen levothyroxine (SYNTHROID, LEVOTHROID) 125 MCG tablet Take 125 mcg by mouth daily before breakfast.      . magnesium oxide (MAG-OX) 400 MG tablet Take 400 mg by mouth daily.      Marland Kitchen  vitamin E 400 UNIT capsule Take 400 Units by mouth daily.        Marland Kitchen warfarin (COUMADIN) 5 MG tablet As directed. RESTART on Sat 06/01/2013 - Take 1/2 tablet (2.5 mg) once daily on Sat, Mon, Wed, Fri. Take 1 tablet (5 mg) once daily on Sun, Tues, Thurs.        VITALS: Blood pressure 123/86, pulse 80, temperature 97.5 F (36.4 C), temperature source Oral, resp. rate 18, height 5\' 11"  (1.803 m), weight 176 lb 6.4 oz (80.015 kg), SpO2 97.00%.  PHYSICAL EXAM: General appearance: alert, cooperative, no distress and Clearly tachypneic at rest, speaking in interrupted sentences Neck: JVD - 6 cm above sternal notch, no adenopathy, no carotid bruit, supple, symmetrical, trachea midline and thyroid not enlarged, symmetric, no tenderness/mass/nodules Lungs: diminished breath sounds bilaterally Heart: regular rate and rhythm, S1: normal, S2: paradoxically splitting and S3 present Abdomen: soft, non-tender; bowel sounds normal; no masses,  no organomegaly Extremities: extremities normal, atraumatic. The left upper shoulder he is markedly edematous with dimpling but per his report is getting better as her some wrinkling of the hand, the left upper extremity is mildly cyanotic; there is minimal swelling of the right ankle Pulses: 2+ and symmetric Skin: Skin color, texture, turgor normal. No rashes or lesions Neurologic: Grossly normal  LABS: Results for orders placed during the hospital encounter of 05/31/13 (from the past 48 hour(s))  URINALYSIS, ROUTINE W REFLEX MICROSCOPIC     Status: Abnormal   Collection Time    05/31/13  7:26 PM      Result Value Range   Color,  Urine AMBER (*) YELLOW   Comment: BIOCHEMICALS MAY BE AFFECTED BY COLOR   APPearance CLOUDY (*) CLEAR   Specific Gravity, Urine 1.015  1.005 - 1.030   pH 5.5  5.0 - 8.0   Glucose, UA NEGATIVE  NEGATIVE mg/dL   Hgb urine dipstick MODERATE (*) NEGATIVE   Bilirubin Urine NEGATIVE  NEGATIVE   Ketones, ur NEGATIVE  NEGATIVE mg/dL   Protein, ur 30 (*) NEGATIVE mg/dL   Urobilinogen, UA 0.2  0.0 - 1.0 mg/dL   Nitrite NEGATIVE  NEGATIVE   Leukocytes, UA MODERATE (*) NEGATIVE  URINE MICROSCOPIC-ADD ON     Status: Abnormal   Collection Time    05/31/13  7:26 PM      Result Value Range   Squamous Epithelial / LPF FEW (*) RARE   WBC, UA 21-50  <3 WBC/hpf   RBC / HPF 3-6  <3 RBC/hpf   Bacteria, UA FEW (*) RARE   Casts HYALINE CASTS (*) NEGATIVE   Urine-Other SPERM PRESENT    CBC WITH DIFFERENTIAL     Status: Abnormal   Collection Time    05/31/13  7:51 PM      Result Value Range   WBC 11.7 (*) 4.0 - 10.5 K/uL   RBC 3.88 (*) 4.22 - 5.81 MIL/uL   Hemoglobin 11.4 (*) 13.0 - 17.0 g/dL   HCT 16.1 (*) 09.6 - 04.5 %   MCV 87.6  78.0 - 100.0 fL   MCH 29.4  26.0 - 34.0 pg   MCHC 33.5  30.0 - 36.0 g/dL   RDW 40.9 (*) 81.1 - 91.4 %   Platelets 119 (*) 150 - 400 K/uL   Comment: CONSISTENT WITH PREVIOUS RESULT   Neutrophils Relative % 88 (*) 43 - 77 %   Neutro Abs 10.3 (*) 1.7 - 7.7 K/uL   Lymphocytes Relative 3 (*) 12 - 46 %  Lymphs Abs 0.3 (*) 0.7 - 4.0 K/uL   Monocytes Relative 9  3 - 12 %   Monocytes Absolute 1.1 (*) 0.1 - 1.0 K/uL   Eosinophils Relative 0  0 - 5 %   Eosinophils Absolute 0.0  0.0 - 0.7 K/uL   Basophils Relative 0  0 - 1 %   Basophils Absolute 0.0  0.0 - 0.1 K/uL  PRO B NATRIURETIC PEPTIDE     Status: Abnormal   Collection Time    05/31/13  7:51 PM      Result Value Range   Pro B Natriuretic peptide (BNP) 8821.0 (*) 0 - 450 pg/mL  POCT I-STAT, CHEM 8     Status: Abnormal   Collection Time    05/31/13  8:01 PM      Result Value Range   Sodium 133 (*) 135 - 145 mEq/L    Potassium 5.2 (*) 3.5 - 5.1 mEq/L   Chloride 100  96 - 112 mEq/L   BUN 47 (*) 6 - 23 mg/dL   Creatinine, Ser 1.61 (*) 0.50 - 1.35 mg/dL   Glucose, Bld 096 (*) 70 - 99 mg/dL   Calcium, Ion 0.45  4.09 - 1.30 mmol/L   TCO2 22  0 - 100 mmol/L   Hemoglobin 11.9 (*) 13.0 - 17.0 g/dL   HCT 81.1 (*) 91.4 - 78.2 %  CBC     Status: Abnormal   Collection Time    06/01/13  4:00 AM      Result Value Range   WBC 11.7 (*) 4.0 - 10.5 K/uL   RBC 3.80 (*) 4.22 - 5.81 MIL/uL   Hemoglobin 11.2 (*) 13.0 - 17.0 g/dL   HCT 95.6 (*) 21.3 - 08.6 %   MCV 88.7  78.0 - 100.0 fL   MCH 29.5  26.0 - 34.0 pg   MCHC 33.2  30.0 - 36.0 g/dL   RDW 57.8 (*) 46.9 - 62.9 %   Platelets 124 (*) 150 - 400 K/uL  COMPREHENSIVE METABOLIC PANEL     Status: Abnormal   Collection Time    06/01/13  4:00 AM      Result Value Range   Sodium 132 (*) 135 - 145 mEq/L   Potassium 5.2 (*) 3.5 - 5.1 mEq/L   Chloride 96  96 - 112 mEq/L   CO2 23  19 - 32 mEq/L   Glucose, Bld 132 (*) 70 - 99 mg/dL   BUN 52 (*) 6 - 23 mg/dL   Creatinine, Ser 5.28 (*) 0.50 - 1.35 mg/dL   Calcium 9.3  8.4 - 41.3 mg/dL   Total Protein 7.0  6.0 - 8.3 g/dL   Albumin 3.4 (*) 3.5 - 5.2 g/dL   AST 244 (*) 0 - 37 U/L   ALT 120 (*) 0 - 53 U/L   Alkaline Phosphatase 107  39 - 117 U/L   Total Bilirubin 1.4 (*) 0.3 - 1.2 mg/dL   GFR calc non Af Amer 42 (*) >90 mL/min   GFR calc Af Amer 48 (*) >90 mL/min   Comment:            The eGFR has been calculated     using the CKD EPI equation.     This calculation has not been     validated in all clinical     situations.     eGFR's persistently     <90 mL/min signify     possible Chronic Kidney Disease.  PRO B NATRIURETIC PEPTIDE  Status: Abnormal   Collection Time    06/01/13  4:30 AM      Result Value Range   Pro B Natriuretic peptide (BNP) 9189.0 (*) 0 - 450 pg/mL  PROTIME-INR     Status: Abnormal   Collection Time    06/01/13  4:30 AM      Result Value Range   Prothrombin Time 16.1 (*) 11.6 -  15.2 seconds   INR 1.32  0.00 - 1.49    IMAGING: Ct Angio Chest Pe W/cm &/or Wo Cm  05/31/2013   *RADIOLOGY REPORT*  Clinical Data: Recent AICD lead removal.  History of a cardiomyopathy.  CT ANGIOGRAPHY CHEST  Technique:  Multidetector CT imaging of the chest using the standard protocol during bolus administration of intravenous contrast. Multiplanar reconstructed images including MIPs were obtained and reviewed to evaluate the vascular anatomy.  Contrast: 80mL OMNIPAQUE IOHEXOL 350 MG/ML SOLN  Comparison: Chest radiograph, 05/28/2013  Findings: No evidence of a pulmonary embolus.  There is moderate enlargement of the heart.  There are dense coronary artery calcifications and changes from previous CABG surgery.  No mediastinal or hilar masses are appreciated and there are no pathologically enlarged lymph nodes.  There is lung base subsegmental atelectasis.  There are mild areas of peripheral interstitial thickening which appear chronic.  No infiltrate or pulmonary edema.  There is no pleural effusion or pneumothorax.  Limited evaluation of the upper abdomen is unremarkable.  There is a burst type fracture of T7, which appears old.  This was present on the prior chest radiograph.  IMPRESSION: No evidence of a pulmonary embolus.  Moderate cardiomegaly.  Mild lung base subsegmental atelectasis. No edema or infiltrate.   Original Report Authenticated By: Amie Portland, M.D.    IMPRESSION: Patient Active Problem List   Diagnosis Date Noted  . Urinary tract infection 06/01/2013  . Left arm swelling 06/01/2013  . Acute on chronic combined systolic and diastolic CHF (congestive heart failure) 06/01/2013  . ICD (implantable cardioverter-defibrillator) infection 05/28/2013  . Pacemaker complications - sterile erosion 05/21/2013  . PAF (paroxysmal atrial fibrillation) 01/31/2013  . Long term (current) use of anticoagulants 01/31/2013  . Glaucoma 08/10/2012  . AICD (automatic cardioverter/defibrillator)  present 08/10/2012  . Hypothyroidism 08/10/2012  . Congestive heart failure 08/10/2012  . ICM- lat EF 35-40% Nov 2011 08/10/2012  . History of hyperlipidemia 08/10/2012  . History of hypertension 08/10/2012  . CHRONIC GRANULOMATOUS DISEASE 12/07/2007  . CAD- CABG X 4 '95, patent grafts '09 12/07/2007  . ASTHMA 12/07/2007  . COPD 12/07/2007  . PULMONARY ASBESTOSIS 12/07/2007  . DYSPNEA 12/07/2007   1. I believe he is showing evidence of decompensated heart failure following discontinuation of cardiac resynchronization therapy as a result of device explantation. Although understandably concerned about his renal function, I believe he requires diuretic therapy and have prescribed it today and for following days. Unfortunately he has received a contrast load for the CT angiogram of his chest and this was given while he had worse than usual renal function and was on ACE inhibitor therapy. There is therefore considerable risk for acute contrast nephrotoxicity and I would not be surprised if his renal function deteriorates further over the next few days. At this point diuretic therapy will not be deleterious. If his renal function worsens, I would temporarily discontinue ACE inhibitor therapy and would also be very cautious with digoxin therapy.  RECOMMENDATION: 1. Daily diuretic therapy 2. Hold ACE inhibitor if renal function deteriorates 3. Temporarily hold digoxin if creatinine  clearance decreases to less than 30 4. When it is time to reimplant his permanent pacemaker I believe he should receive a CRT P. Device (not a defibrillator). We discussed the different indications for pacemakers versus defibrillators and the fact that at his current age defibrillator therapy is less likely to be of true benefit. He agrees with this. He would prefer not to have a defibrillator. He does understand that a biventricular device would provide improved quality of life as well as length of life, without the risks  associated with defibrillator is. I believe this coincides with Dr. Lubertha Basque recommendation.  Time Spent Directly with Patient: 45 minutes  Thurmon Fair, MD, [facility] [phone] office [phone] pager  06/01/2013, 10:51 AM

## 2013-06-01 NOTE — H&P (Signed)
[facility] History and Physical  [name]  ZOX:[id number]  DOB: [birth year]  DOA: 05/31/2013  Referring physician: dr Ethelda Chick PCP: Margaree Mackintosh, MD   Chief Complaint: Shortness of breath and weakness  HPI: [name] is a 79 y.o. male who was recently admitted in the hospital for removal of infected AICD and replacement of temporary pacemaker. He had a Foley catheter placement at that time. His warfarin was on hold off for the procedure under the Saturday. Since the discharge she has been complaining of burning urination and some blood in the urine. He also had a progressive worsening of shortness of breath with clear mucus production without any blood. He has noted significant swelling of his left upper extremity off of the procedure. He mentions that it was red and warm, but there is no pain.  No focal neurological deficit, nausea, vomiting, diarrhea, headache.  Review of Systems: as mentioned in the history of present illness.  A Comprehensive review of the other systems is negative.  Past Medical History  Diagnosis Date  . Ischemic cardiomyopathy     St. Jude BiV ICD  . Paroxysmal atrial fibrillation     sinus rhythm on amiodarone  . Asbestos exposure     plaques on CT  . Compression fracture     T7  . Pacemaker   . Hypertension   . Coronary artery disease   . Arthritis     "little, in my hands" (05/21/2013)  . COPD (chronic obstructive pulmonary disease)     pt denies this hx on 05/21/2013  . Automatic implantable cardioverter-defibrillator in situ   . Hypothyroidism   . CHF (congestive heart failure)     hx.   Past Surgical History  Procedure Laterality Date  . Inguinal hernia repair Right   . Coronary angioplasty      "several through the years; 3 or 4 or 5" (05/21/2013)  . Coronary angioplasty with stent placement      "I have 1 stent" (05/21/2013)  . Coronary artery bypass graft  11/01/1994    SVG to first diagonal, to distal RCA, to the ramus  intermedius artery, and LIMA the LAD  . Insert / replace / remove pacemaker  ?1999  . Cataract extraction w/ intraocular lens  implant, bilateral Bilateral   . Icd lead removal Left 05/27/2013    Procedure: ICD LEAD REMOVAL;  Surgeon: Marinus Maw, MD;  Location: [facility] OR;  Service: Cardiovascular;  Laterality: Left;   Social History:  reports that he has never smoked. He has never used smokeless tobacco. He reports that he drinks about 8.4 ounces of alcohol per week. He reports that he does not use illicit drugs. Patient is coming from home. Patient can participate in ADLs.  No Known Allergies  Family History  Problem Relation Age of Onset  . Heart disease Father     Prior to Admission medications   Medication Sig Start Date End Date Taking? Authorizing Provider  albuterol (PROVENTIL HFA;VENTOLIN HFA) 108 (90 BASE) MCG/ACT inhaler Inhale 2 puffs into the lungs every 6 (six) hours as needed for wheezing or shortness of breath.   Yes Historical Provider, MD  bimatoprost (LUMIGAN) 0.03 % ophthalmic solution Place 1 drop into both eyes at bedtime.    Yes Historical Provider, MD  budesonide-formoterol (SYMBICORT) 160-4.5 MCG/ACT inhaler Inhale 1 puff into the lungs 2 (two) times daily.   Yes Historical Provider, MD  captopril (CAPOTEN) 25 MG tablet Take 0.5 tablets (12.5 mg total) by  mouth 2 (two) times daily. 05/20/13  Yes Mihai [name], MD  Coenzyme Q10 (COQ10) 100 MG CAPS Take 100 mg by mouth daily.    Yes Historical Provider, MD  COMBIGAN 0.2-0.5 % ophthalmic solution Place 1 drop into both eyes Twice daily. 03/17/12  Yes Historical Provider, MD  digoxin (LANOXIN) 0.125 MG tablet Take 125 mcg by mouth daily.     Yes Historical Provider, MD  dorzolamide (TRUSOPT) 2 % ophthalmic solution Place 1 drop into the left eye Twice daily. 04/16/12  Yes Historical Provider, MD  fish oil-omega-3 fatty acids 1000 MG capsule Take 2 g by mouth daily.   Yes Historical Provider, MD  folic  acid-pyridoxine-cyancobalamin (FOLTX) 2.5-25-2 MG TABS Take 1 tablet by mouth daily.   Yes Historical Provider, MD  furosemide (LASIX) 80 MG tablet Take 80 mg by mouth daily.     Yes Historical Provider, MD  KLOR-CON 10 10 MEQ CR tablet Take 10 mEq by mouth daily.  04/29/11  Yes Historical Provider, MD  levothyroxine (SYNTHROID, LEVOTHROID) 125 MCG tablet Take 125 mcg by mouth daily before breakfast.   Yes Historical Provider, MD  magnesium oxide (MAG-OX) 400 MG tablet Take 400 mg by mouth daily.   Yes Historical Provider, MD  vitamin E 400 UNIT capsule Take 400 Units by mouth daily.     Yes Historical Provider, MD  warfarin (COUMADIN) 5 MG tablet As directed. RESTART on Sat 06/01/2013 - Take 1/2 tablet (2.5 mg) once daily on Sat, Mon, Wed, Fri. Take 1 tablet (5 mg) once daily on Sun, Tues, Thurs. 05/29/13   Minda Meo, PA-C    Physical Exam: Filed Vitals:   05/31/13 1829 05/31/13 1840 06/01/13 0113  BP: 131/73 120/68 120/66  Pulse: 80 80 80  Temp:   97.8 F (36.6 C)  TempSrc:   Oral  Resp: 30 29 20   Height:   5\' 11"  (1.803 m)  Weight:   80.015 kg (176 lb 6.4 oz)  SpO2: 98% 100% 96%    General: Alert, Awake and Oriented to Time, Place and Person. Appear in mild distress Eyes: PERRL ENT: Oral Mucosa clear. Neck: Minimal JVD, no Carotid Bruits, no Stiffness Cardiovascular: S1 and S2 Present, Murmur systolic present, Peripheral Pulses Present Respiratory: Bilateral Air entry equal and Decreased, Crackles faint basal, wheezes occasional  Abdomen: Bowel Sound Present, Soft and Non tender, no  Organomegaly Skin: no decubitus Ulcer Extremities: Pedal edema bilaterally right more than left, left the extremity swelling with redness without any warmth or pain, calf tenderness Absent. Neurologic: Mental status, Motor strength, Sensation, reflexes, Proprioception Grossly Unremarkable.  Labs on Admission:  Basic Metabolic Panel:  Recent Labs Lab 05/28/13 0800 05/29/13 0120  05/31/13 2001  NA 136 134* 133*  K 4.2 4.0 5.2*  CL 101 99 100  CO2 24 26  --   GLUCOSE 159* 139* 154*  BUN 27* 31* 47*  CREATININE 1.13 1.30 1.70*  CALCIUM 8.7 8.5  --    Liver Function Tests: No results found for this basename: AST, ALT, ALKPHOS, BILITOT, PROT, ALBUMIN,  in the last 168 hours No results found for this basename: LIPASE, AMYLASE,  in the last 168 hours No results found for this basename: AMMONIA,  in the last 168 hours CBC:  Recent Labs Lab 05/28/13 0800 05/29/13 0120 05/31/13 1951 05/31/13 2001  WBC 5.7 5.8 11.7*  --   NEUTROABS  --   --  10.3*  --   HGB 11.3* 10.7* 11.4* 11.9*  HCT 34.5* 31.4*  34.0* 35.0*  MCV 88.9 87.5 87.6  --   PLT 102* 89* 119*  --    Cardiac Enzymes: No results found for this basename: CKTOTAL, CKMB, CKMBINDEX, TROPONINI,  in the last 168 hours  BNP (last 3 results)  Recent Labs  05/31/13 1951  PROBNP 8821.0*   CBG: No results found for this basename: GLUCAP,  in the last 168 hours  Radiological Exams on Admission: Ct Angio Chest Pe W/cm &/or Wo Cm  05/31/2013   *RADIOLOGY REPORT*  Clinical Data: Recent AICD lead removal.  History of a cardiomyopathy.  CT ANGIOGRAPHY CHEST  Technique:  Multidetector CT imaging of the chest using the standard protocol during bolus administration of intravenous contrast. Multiplanar reconstructed images including MIPs were obtained and reviewed to evaluate the vascular anatomy.  Contrast: 80mL OMNIPAQUE IOHEXOL 350 MG/ML SOLN  Comparison: Chest radiograph, 05/28/2013  Findings: No evidence of a pulmonary embolus.  There is moderate enlargement of the heart.  There are dense coronary artery calcifications and changes from previous CABG surgery.  No mediastinal or hilar masses are appreciated and there are no pathologically enlarged lymph nodes.  There is lung base subsegmental atelectasis.  There are mild areas of peripheral interstitial thickening which appear chronic.  No infiltrate or pulmonary  edema.  There is no pleural effusion or pneumothorax.  Limited evaluation of the upper abdomen is unremarkable.  There is a burst type fracture of T7, which appears old.  This was present on the prior chest radiograph.  IMPRESSION: No evidence of a pulmonary embolus.  Moderate cardiomegaly.  Mild lung base subsegmental atelectasis. No edema or infiltrate.   Original Report Authenticated By: Amie Portland, M.D.    Assessment/Plan Principal Problem:   Urinary tract infection Active Problems:   CAD- CABG X 4 '95, patent grafts '09   COPD   Congestive heart failure   Long term (current) use of anticoagulants   Left arm swelling   1. UTI Patient has procedure related UTI. He has been given IV ceftriaxone in the ED, the new change the antibiotic to IV ciprofloxacin to cover Pseudomonas.  2. left upper arm swelling, right leg swelling The patient mentions that both swelling has been new.  He Is not taking his warfarin since last few days He does have a CT chest NG tube which is negative for pulmonary embolism but that does not rule out any DVT in the extremities. Therefore we will obtain Doppler of both the extremities. It does not appear to be infected nor it does appear to be inflamed. Therefore antibiotics coverage but not changed based on that   3. CHF and CAD  Patient does have history of significant CHF with low ejection fraction. Who presents with acute kidney injury. Potential cause of acute kidney injury is over diuresis therefore we will hold off on Lasix and for one dose. And titrate it. Daily I.'s and O. daily weight  Continue digoxin and captopril   4.Hypothyroidism Continue Synthroid check TSH  DVT Prophylaxis: Heparin Nutrition: Fluid and salt restriction  Code Status: Full   Author: Lynden Oxford, MD [facility] Pager: [phone] 06/01/2013 2:00 AM    If 7PM-7AM, please contact night-coverage [email or web address] Password [email or web address]

## 2013-06-01 NOTE — Progress Notes (Signed)
Patient diet fluid consistency is pudding thick.  Family & patient stated that there is no reason it should be pudding thick. Patient denies swallowing issues and takes thin liquids and pills OK.  Spoke with Benedetto Coons, NP, will change fluid consistency to thin liquids. Steele Berg RN

## 2013-06-01 NOTE — Progress Notes (Signed)
VASCULAR LAB PRELIMINARY  PRELIMINARY  PRELIMINARY  PRELIMINARY  Left upper and right lower extremity Dopplers completed.    Preliminary report:  There is acute, occlusive DVT noted in the left internal jugular vein.  There is acute, non occlusive DVT noted in the left subclavian, axillary, and brachial veins.                                   There is no DVT or SVT noted in the right lower extremity.  Ladavia Lindenbaum, RVT 06/01/2013, 4:39 PM

## 2013-06-01 NOTE — Progress Notes (Addendum)
Preliminary doppler report reviewed. Left IJ non occlusive thrombus noted. Patient is restarted on coumadin for Afib. Has subtherapeutic INR as he was off it since recent ICD extraction. GFR is 42 only. Will bridge with IV hepari for acute DVT. Pharmacy consulted.

## 2013-06-01 NOTE — Progress Notes (Signed)
ANTICOAGULATION CONSULT NOTE - Initial Consult  Pharmacy Consult for Coumadin Indication: atrial fibrillation  No Known Allergies  Patient Measurements: Height: 5\' 11"  (180.3 cm) Weight: 176 lb 6.4 oz (80.015 kg) IBW/kg (Calculated) : 75.3  Vital Signs: Temp: 97.8 F (36.6 C) (07/19 0113) Temp src: Oral (07/19 0113) BP: 120/66 mmHg (07/19 0113) Pulse Rate: 80 (07/19 0113)  Labs:  Recent Labs  05/31/13 1951 05/31/13 2001  HGB 11.4* 11.9*  HCT 34.0* 35.0*  PLT 119*  --   CREATININE  --  1.70*    Estimated Creatinine Clearance: 33.8 ml/min (by C-G formula based on Cr of 1.7).   Medical History: Past Medical History  Diagnosis Date  . Ischemic cardiomyopathy     St. Jude BiV ICD  . Paroxysmal atrial fibrillation     sinus rhythm on amiodarone  . Asbestos exposure     plaques on CT  . Compression fracture     T7  . Pacemaker   . Hypertension   . Coronary artery disease   . Arthritis     "little, in my hands" (05/21/2013)  . COPD (chronic obstructive pulmonary disease)     pt denies this hx on 05/21/2013  . Automatic implantable cardioverter-defibrillator in situ   . Hypothyroidism   . CHF (congestive heart failure)     hx.    Medications:  Prescriptions prior to admission  Medication Sig Dispense Refill  . albuterol (PROVENTIL HFA;VENTOLIN HFA) 108 (90 BASE) MCG/ACT inhaler Inhale 2 puffs into the lungs every 6 (six) hours as needed for wheezing or shortness of breath.      . bimatoprost (LUMIGAN) 0.03 % ophthalmic solution Place 1 drop into both eyes at bedtime.       . budesonide-formoterol (SYMBICORT) 160-4.5 MCG/ACT inhaler Inhale 1 puff into the lungs 2 (two) times daily.      . captopril (CAPOTEN) 25 MG tablet Take 0.5 tablets (12.5 mg total) by mouth 2 (two) times daily.  90 tablet  2  . Coenzyme Q10 (COQ10) 100 MG CAPS Take 100 mg by mouth daily.       . COMBIGAN 0.2-0.5 % ophthalmic solution Place 1 drop into both eyes Twice daily.      . digoxin  (LANOXIN) 0.125 MG tablet Take 125 mcg by mouth daily.        . dorzolamide (TRUSOPT) 2 % ophthalmic solution Place 1 drop into the left eye Twice daily.      . fish oil-omega-3 fatty acids 1000 MG capsule Take 2 g by mouth daily.      . folic acid-pyridoxine-cyancobalamin (FOLTX) 2.5-25-2 MG TABS Take 1 tablet by mouth daily.      . furosemide (LASIX) 80 MG tablet Take 80 mg by mouth daily.        Marland Kitchen KLOR-CON 10 10 MEQ CR tablet Take 10 mEq by mouth daily.       Marland Kitchen levothyroxine (SYNTHROID, LEVOTHROID) 125 MCG tablet Take 125 mcg by mouth daily before breakfast.      . magnesium oxide (MAG-OX) 400 MG tablet Take 400 mg by mouth daily.      . vitamin E 400 UNIT capsule Take 400 Units by mouth daily.        Marland Kitchen warfarin (COUMADIN) 5 MG tablet As directed. RESTART on Sat 06/01/2013 - Take 1/2 tablet (2.5 mg) once daily on Sat, Mon, Wed, Fri. Take 1 tablet (5 mg) once daily on Sun, Tues, Thurs.       Scheduled:  . budesonide-formoterol  1 puff Inhalation BID  . captopril  12.5 mg Oral BID  . cefTRIAXone (ROCEPHIN)  IV  1 g Intravenous Q24H  . digoxin  125 mcg Oral Daily  . folic acid-pyridoxine-cyancobalamin  1 tablet Oral Daily  . levothyroxine  125 mcg Oral QAC breakfast  . sodium chloride  3 mL Intravenous Q12H  . Warfarin - Pharmacist Dosing Inpatient   Does not apply q1800    Assessment: 79yo male was discharged 7/16 for chronically infected BiV ICD, now c/o SOB and dizziness w/ urinary burning, frequency, and dysuria, admitted for UTI, to continue Coumadin for Afib; had been held for ICD extraction w/ plan to resume today.  Goal of Therapy:  INR 2-3   Plan:  Will obtain INR prior to dosing.  Vernard Gambles, PharmD, BCPS  06/01/2013,2:45 AM

## 2013-06-02 ENCOUNTER — Inpatient Hospital Stay (HOSPITAL_COMMUNITY): Payer: Medicare Other

## 2013-06-02 DIAGNOSIS — I82890 Acute embolism and thrombosis of other specified veins: Secondary | ICD-10-CM

## 2013-06-02 DIAGNOSIS — I5043 Acute on chronic combined systolic (congestive) and diastolic (congestive) heart failure: Secondary | ICD-10-CM

## 2013-06-02 DIAGNOSIS — I509 Heart failure, unspecified: Secondary | ICD-10-CM

## 2013-06-02 DIAGNOSIS — N390 Urinary tract infection, site not specified: Secondary | ICD-10-CM

## 2013-06-02 LAB — CBC
Hemoglobin: 10.9 g/dL — ABNORMAL LOW (ref 13.0–17.0)
MCHC: 34.2 g/dL (ref 30.0–36.0)
RDW: 16.9 % — ABNORMAL HIGH (ref 11.5–15.5)
WBC: 6.7 10*3/uL (ref 4.0–10.5)

## 2013-06-02 LAB — PROTIME-INR
INR: 1.38 (ref 0.00–1.49)
Prothrombin Time: 16.6 seconds — ABNORMAL HIGH (ref 11.6–15.2)

## 2013-06-02 LAB — HEPARIN LEVEL (UNFRACTIONATED)
Heparin Unfractionated: 0.26 IU/mL — ABNORMAL LOW (ref 0.30–0.70)
Heparin Unfractionated: 0.39 IU/mL (ref 0.30–0.70)

## 2013-06-02 LAB — BASIC METABOLIC PANEL
BUN: 46 mg/dL — ABNORMAL HIGH (ref 6–23)
Creatinine, Ser: 1.12 mg/dL (ref 0.50–1.35)
GFR calc Af Amer: 67 mL/min — ABNORMAL LOW (ref >90)
GFR calc non Af Amer: 58 mL/min — ABNORMAL LOW (ref >90)
Potassium: 3.8 mEq/L (ref 3.5–5.1)

## 2013-06-02 LAB — HEPATIC FUNCTION PANEL
Bilirubin, Direct: 0.5 mg/dL — ABNORMAL HIGH (ref 0.0–0.3)
Indirect Bilirubin: 0.6 mg/dL (ref 0.3–0.9)
Total Protein: 6.3 g/dL (ref 6.0–8.3)

## 2013-06-02 MED ORDER — DORZOLAMIDE HCL 2 % OP SOLN
1.0000 [drp] | Freq: Two times a day (BID) | OPHTHALMIC | Status: DC
  Filled 2013-06-02: qty 10

## 2013-06-02 MED ORDER — MAGNESIUM OXIDE 400 MG PO TABS
400.0000 mg | ORAL_TABLET | Freq: Every day | ORAL | Status: DC
  Administered 2013-06-02 – 2013-06-04 (×3): 400 mg via ORAL
  Filled 2013-06-02 (×3): qty 1

## 2013-06-02 MED ORDER — DEXTROSE 5 % IV SOLN
1.0000 g | Freq: Two times a day (BID) | INTRAVENOUS | Status: DC
  Administered 2013-06-02: 1 g via INTRAVENOUS
  Filled 2013-06-02 (×3): qty 1

## 2013-06-02 MED ORDER — HYDROCODONE-ACETAMINOPHEN 5-325 MG PO TABS
1.0000 | ORAL_TABLET | Freq: Four times a day (QID) | ORAL | Status: DC | PRN
  Administered 2013-06-02 – 2013-06-03 (×3): 1 via ORAL
  Filled 2013-06-02 (×4): qty 1

## 2013-06-02 MED ORDER — FUROSEMIDE 10 MG/ML IJ SOLN
80.0000 mg | Freq: Once | INTRAMUSCULAR | Status: AC
  Administered 2013-06-02: 80 mg via INTRAVENOUS
  Filled 2013-06-02: qty 8

## 2013-06-02 MED ORDER — BIMATOPROST 0.03 % OP SOLN: 1.0000 [drp] | Freq: Every day | OPHTHALMIC | Status: DC

## 2013-06-02 MED ORDER — OMEGA-3-ACID ETHYL ESTERS 1 G PO CAPS
1.0000 g | ORAL_CAPSULE | Freq: Every day | ORAL | Status: DC
  Administered 2013-06-02 – 2013-06-04 (×3): 1 g via ORAL
  Filled 2013-06-02 (×3): qty 1

## 2013-06-02 MED ORDER — BRIMONIDINE TARTRATE-TIMOLOL 0.2-0.5 % OP SOLN: 1.0000 [drp] | Freq: Two times a day (BID) | OPHTHALMIC | Status: DC

## 2013-06-02 MED ORDER — DEXTROSE 5 % IV SOLN
1.0000 g | INTRAVENOUS | Status: AC
  Administered 2013-06-02: 1 g via INTRAVENOUS
  Filled 2013-06-02: qty 1

## 2013-06-02 MED ORDER — OMEGA-3 FATTY ACIDS 1000 MG PO CAPS: 2.0000 g | ORAL_CAPSULE | Freq: Every day | ORAL | Status: DC

## 2013-06-02 MED ORDER — FUROSEMIDE 40 MG PO TABS
40.0000 mg | ORAL_TABLET | Freq: Two times a day (BID) | ORAL | Status: DC
  Filled 2013-06-02 (×3): qty 1

## 2013-06-02 MED ORDER — BIMATOPROST 0.01 % OP SOLN
1.0000 [drp] | Freq: Every day | OPHTHALMIC | Status: DC
  Administered 2013-06-02 – 2013-06-03 (×2): 1 [drp] via OPHTHALMIC
  Filled 2013-06-02: qty 2.5

## 2013-06-02 MED ORDER — ALBUTEROL SULFATE (5 MG/ML) 0.5% IN NEBU: 2.5000 mg | INHALATION_SOLUTION | Freq: Four times a day (QID) | RESPIRATORY_TRACT | Status: DC | PRN

## 2013-06-02 MED ORDER — WARFARIN SODIUM 5 MG PO TABS
5.0000 mg | ORAL_TABLET | Freq: Once | ORAL | Status: AC
  Administered 2013-06-02: 5 mg via ORAL
  Filled 2013-06-02: qty 1

## 2013-06-02 NOTE — Progress Notes (Signed)
Gave incentive spirometry to patient with teach back.  Patient demonstrated use and stated he feel comfortable using it.  Encouraged to keep IS at bedside within reach.

## 2013-06-02 NOTE — Progress Notes (Signed)
Physical Therapy Evaluation Patient Details Name: [name] MRN: [id number] DOB: [birth year] Today's Date: 06/02/2013 Time: 1010-1037 PT Time Calculation (min): 27 min  PT Assessment / Plan / Recommendation History of Present Illness  Pt was recently admitted for change of changing of infected AICD and now returns with symptoms of UTI and swelling of the LUE with DVT left internal jugular. Now on heparin  Clinical Impression  Pt dizzy with ambulation, nsg to get orthostatics when pt returns to bed. Expect this will clear once he is moving more but due to persistence throughout ambulation, PT will follow to r/o vestibular issues and advance safe mobility. Do not anticipate that pt will have any PT needs at d/c.    PT Assessment  Patient needs continued PT services    Follow Up Recommendations  No PT follow up    Does the patient have the potential to tolerate intense rehabilitation      Barriers to Discharge        Equipment Recommendations  Other (comment) (TBD)    Recommendations for Other Services     Frequency Min 3X/week    Precautions / Restrictions Precautions Precautions: None Restrictions Weight Bearing Restrictions: No Other Position/Activity Restrictions: restricted mvmt of LUE   Pertinent Vitals/Pain No c/o pain      Mobility  Bed Mobility Bed Mobility: Supine to Sit;Sitting - Scoot to Edge of Bed Supine to Sit: 7: Independent Sitting - Scoot to Edge of Bed: 7: Independent Transfers Transfers: Sit to Stand;Stand to Sit Sit to Stand: 5: Supervision;From bed Stand to Sit: 5: Supervision;To chair/3-in-1 Details for Transfer Assistance: pt with dizziness with standing that persisted throughout ambulation Ambulation/Gait Ambulation/Gait Assistance: 4: Min guard Ambulation Distance (Feet): 100 Feet Assistive device: Other (Comment) (pushed IV pole part of time) Ambulation/Gait Assistance Details: pt unsteady due to dizziness which remained stable  throughout ambulation. Expect that this is due to pt being in bed past 2 days but will follow to r/o vestibular issues. No increase in dizziness with changes of direction or head turns. Pt ambulated part of time without pushing IV pole with unsteadiness but no LOB. Gait Pattern: Step-through pattern Gait velocity: decreased Stairs: No Wheelchair Mobility Wheelchair Mobility: No    Exercises General Exercises - Lower Extremity Ankle Circles/Pumps: AROM;Both;10 reps;Seated   PT Diagnosis: Abnormality of gait  PT Problem List: Decreased balance;Decreased mobility;Decreased activity tolerance PT Treatment Interventions: DME instruction;Gait training;Functional mobility training;Therapeutic activities;Therapeutic exercise;Balance training;Patient/family education     PT Goals(Current goals can be found in the care plan section) Acute Rehab PT Goals Patient Stated Goal: return home PT Goal Formulation: With patient Time For Goal Achievement: 06/16/13 Potential to Achieve Goals: Good  Visit Information  Last PT Received On: 06/02/13 Assistance Needed: +1 History of Present Illness: Pt was recently admitted for change of changing of infected AICD and now returns with symptoms of UTI and swelling of the LUE with DVT left internal jugular. Now on heparin       Prior Functioning  Home Living Family/patient expects to be discharged to:: Private residence Living Arrangements: Alone Available Help at Discharge: Friend(s);Available PRN/intermittently;Family Type of Home: House Home Access: Level entry Home Layout: Two level;Able to live on main level with bedroom/bathroom Home Equipment: None Additional Comments: pt has daughters that help and a friend, Maralyn Sago, present on eval Prior Function Level of Independence: Independent Comments: pt still drives Communication Communication: No difficulties Dominant Hand: Right    Cognition  Cognition Arousal/Alertness: Awake/alert Behavior  During Therapy:  WFL for tasks assessed/performed Overall Cognitive Status: Within Functional Limits for tasks assessed    Extremity/Trunk Assessment Upper Extremity Assessment Upper Extremity Assessment: LUE deficits/detail LUE Deficits / Details: LUE swollen and with erythema Lower Extremity Assessment Lower Extremity Assessment: Overall WFL for tasks assessed Cervical / Trunk Assessment Cervical / Trunk Assessment: Kyphotic   Balance Balance Balance Assessed: Yes Dynamic Standing Balance Dynamic Standing - Balance Support: No upper extremity supported;During functional activity Dynamic Standing - Level of Assistance: 5: Stand by assistance  End of Session PT - End of Session Equipment Utilized During Treatment: Gait belt Activity Tolerance: Patient tolerated treatment well Patient left: in chair;with call bell/phone within reach;with family/visitor present Nurse Communication: Mobility status  GP   Lyanne Co, PT  Acute Rehab Services  [phone]  Lyanne Co 06/02/2013, 10:45 AM

## 2013-06-02 NOTE — Progress Notes (Signed)
ANTIBIOTIC CONSULT NOTE - INITIAL  Pharmacy Consult for Cefepime Indication: Pseudomonas UTI  No Known Allergies  Patient Measurements: Height: 5\' 11"  (180.3 cm) Weight: 176 lb 6.4 oz (80.015 kg) IBW/kg (Calculated) : 75.3 Adjusted Body Weight:   Vital Signs: Temp: 98 F (36.7 C) (07/20 1033) Temp src: Oral (07/20 1033) BP: 124/64 mmHg (07/20 1035) Pulse Rate: 80 (07/20 1035) Intake/Output from previous day: 07/19 0701 - 07/20 0700 In: 420 [P.O.:420] Out: 2225 [Urine:2225] Intake/Output from this shift: Total I/O In: 120 [P.O.:120] Out: -   Labs:  Recent Labs  05/31/13 1951 05/31/13 2001 06/01/13 0400 06/02/13 0600  WBC 11.7*  --  11.7* 6.7  HGB 11.4* 11.9* 11.2* 10.9*  PLT 119*  --  124* 126*  CREATININE  --  1.70* 1.47* 1.12   Estimated Creatinine Clearance: 51.4 ml/min (by C-G formula based on Cr of 1.12). No results found for this basename: VANCOTROUGH, Leodis Binet, VANCORANDOM, GENTTROUGH, GENTPEAK, GENTRANDOM, TOBRATROUGH, TOBRAPEAK, TOBRARND, AMIKACINPEAK, AMIKACINTROU, AMIKACIN,  in the last 72 hours   Microbiology: Recent Results (from the past 720 hour(s))  CULTURE, BLOOD (SINGLE)     Status: None   Collection Time    05/21/13  8:15 PM      Result Value Range Status   Specimen Description BLOOD LEFT ARM   Final   Special Requests BOTTLES DRAWN AEROBIC AND ANAEROBIC 10CC   Final   Culture  Setup Time 05/22/2013 03:18   Final   Culture NO GROWTH 5 DAYS   Final   Report Status 05/28/2013 FINAL   Final  MRSA PCR SCREENING     Status: None   Collection Time    05/27/13  8:42 PM      Result Value Range Status   MRSA by PCR NEGATIVE  NEGATIVE Final   Comment:            The GeneXpert MRSA Assay (FDA     approved for NASAL specimens     only), is one component of a     comprehensive MRSA colonization     surveillance program. It is not     intended to diagnose MRSA     infection nor to guide or     monitor treatment for     MRSA infections.  URINE  CULTURE     Status: None   Collection Time    05/31/13  7:26 PM      Result Value Range Status   Specimen Description URINE, CLEAN CATCH   Final   Special Requests ADDED 0045 06/01/13   Final   Culture  Setup Time 06/01/2013 00:40   Final   Colony Count >=100,000 COLONIES/ML   Final   Culture PSEUDOMONAS AERUGINOSA   Final   Report Status PENDING   Incomplete    Medical History: Past Medical History  Diagnosis Date  . Ischemic cardiomyopathy     St. Jude BiV ICD  . Paroxysmal atrial fibrillation     sinus rhythm on amiodarone  . Asbestos exposure     plaques on CT  . Compression fracture     T7  . Pacemaker   . Hypertension   . Coronary artery disease   . Arthritis     "little, in my hands" (05/21/2013)  . COPD (chronic obstructive pulmonary disease)     pt denies this hx on 05/21/2013  . Automatic implantable cardioverter-defibrillator in situ   . Hypothyroidism   . CHF (congestive heart failure)     hx.  Medications:  Scheduled:  . bimatoprost  1 drop Both Eyes QHS  . brimonidine  1 drop Both Eyes BID  . budesonide-formoterol  1 puff Inhalation BID  . ceFEPime (MAXIPIME) IV  1 g Intravenous NOW  . ceFEPime (MAXIPIME) IV  1 g Intravenous Q12H  . digoxin  125 mcg Oral Daily  . dorzolamide  1 drop Left Eye BID  . folic acid-pyridoxine-cyancobalamin  1 tablet Oral Daily  . levothyroxine  125 mcg Oral QAC breakfast  . magnesium oxide  400 mg Oral Daily  . omega-3 acid ethyl esters  1 g Oral Daily  . sodium chloride  3 mL Intravenous Q12H  . tamsulosin  0.4 mg Oral QPC supper  . timolol  1 drop Both Eyes BID  . warfarin  5 mg Oral ONCE-1800  . Warfarin - Pharmacist Dosing Inpatient   Does not apply q1800   Assessment: 79yo male with UTI, cx returning (+)pseudomonas and sensitivites currently pending.  Pt has been on Rocephin, changing to Cefepime.  Goal of Therapy:  resolution of UTI  Plan:  1.  Cefepime 1gm IV q12 2.  F/U cx results  Marisue Humble,  PharmD Clinical Pharmacist [facility]

## 2013-06-02 NOTE — Progress Notes (Signed)
ANTICOAGULATION CONSULT NOTE - Follow Up Consult  Pharmacy Consult for heparin Indication: DVT  Labs:  Recent Labs  05/31/13 1951 05/31/13 2001 06/01/13 0400 06/01/13 0430 06/02/13 0600  HGB 11.4* 11.9* 11.2*  --  10.9*  HCT 34.0* 35.0* 33.7*  --  31.9*  PLT 119*  --  124*  --  126*  LABPROT  --   --   --  16.1* 16.6*  INR  --   --   --  1.32 1.38  HEPARINUNFRC  --   --   --   --  0.26*  CREATININE  --  1.70* 1.47*  --   --     Assessment: 79yo male slightly subtherapeutic on heparin with initial dosing for DVT.  Goal of Therapy:  Heparin level 0.3-0.7 units/ml   Plan:  Will increase heparin gtt by 2 units/kg/hr to 1400 units/hr and check level in 8hr.  Vernard Gambles, PharmD, BCPS  06/02/2013,6:57 AM

## 2013-06-02 NOTE — Progress Notes (Signed)
ANTICOAGULATION CONSULT NOTE - Follow Up Consult  Pharmacy Consult for Heparin  Indication: atrial fibrillation, DVT-new  No Known Allergies  Patient Measurements: Height: 5\' 11"  (180.3 cm) Weight: 176 lb 6.4 oz (80.015 kg) IBW/kg (Calculated) : 75.3 Heparin Dosing Weight:   Vital Signs: Temp: 98.5 F (36.9 C) (07/20 1342) Temp src: Oral (07/20 1342) BP: 113/57 mmHg (07/20 1342) Pulse Rate: 79 (07/20 1342)  Labs:  Recent Labs  05/31/13 1951 05/31/13 2001 06/01/13 0400 06/01/13 0430 06/02/13 0600 06/02/13 1603  HGB 11.4* 11.9* 11.2*  --  10.9*  --   HCT 34.0* 35.0* 33.7*  --  31.9*  --   PLT 119*  --  124*  --  126*  --   LABPROT  --   --   --  16.1* 16.6*  --   INR  --   --   --  1.32 1.38  --   HEPARINUNFRC  --   --   --   --  0.26* 0.39  CREATININE  --  1.70* 1.47*  --  1.12  --     Estimated Creatinine Clearance: 51.4 ml/min (by C-G formula based on Cr of 1.12).   Medications:  Scheduled:  . bimatoprost  1 drop Both Eyes QHS  . brimonidine  1 drop Both Eyes BID  . budesonide-formoterol  1 puff Inhalation BID  . ceFEPime (MAXIPIME) IV  1 g Intravenous Q12H  . digoxin  125 mcg Oral Daily  . dorzolamide  1 drop Left Eye BID  . folic acid-pyridoxine-cyancobalamin  1 tablet Oral Daily  . levothyroxine  125 mcg Oral QAC breakfast  . magnesium oxide  400 mg Oral Daily  . omega-3 acid ethyl esters  1 g Oral Daily  . sodium chloride  3 mL Intravenous Q12H  . tamsulosin  0.4 mg Oral QPC supper  . timolol  1 drop Both Eyes BID  . warfarin  5 mg Oral ONCE-1800  . Warfarin - Pharmacist Dosing Inpatient   Does not apply q1800    Assessment: 79yo male with AFib on chronic Coumadin which had been held for ICD extraction and resumed yesterday.  Heparin was added for (+)DVT last PM.  No bleeding noted.  Heparin level at goal.    Goal of Therapy:  Heparin level 0.3-0.7 Monitor platelets by anticoagulation protocol: Yes   Plan:  Continue Heparin at 1400  units/hr. Check daily heparin level/cbc.  Wendie Simmer, PharmD, BCPS Clinical Pharmacist  Pager: [phone]

## 2013-06-02 NOTE — Progress Notes (Signed)
06/02/2013 patient daughter stated he was c/o dizziness after the cardiologist left out the room this morning. Patient got up with Physical therapy and Rn was told patient was dizzy after getting out of bed. Orthostatic BP was done and Dr. Gonzella Lex  Was aware. Continue to monitor patient.

## 2013-06-02 NOTE — Progress Notes (Signed)
ANTICOAGULATION CONSULT NOTE - Follow Up Consult  Pharmacy Consult for Heparin and Coumadin Indication: atrial fibrillation, DVT-new  No Known Allergies  Patient Measurements: Height: 5\' 11"  (180.3 cm) Weight: 176 lb 6.4 oz (80.015 kg) IBW/kg (Calculated) : 75.3 Heparin Dosing Weight:   Vital Signs: Temp: 97.7 F (36.5 C) (07/20 0928) Temp src: Oral (07/20 0928) BP: 118/75 mmHg (07/20 0928) Pulse Rate: 80 (07/20 0928)  Labs:  Recent Labs  05/31/13 1951 05/31/13 2001 06/01/13 0400 06/01/13 0430 06/02/13 0600  HGB 11.4* 11.9* 11.2*  --  10.9*  HCT 34.0* 35.0* 33.7*  --  31.9*  PLT 119*  --  124*  --  126*  LABPROT  --   --   --  16.1* 16.6*  INR  --   --   --  1.32 1.38  HEPARINUNFRC  --   --   --   --  0.26*  CREATININE  --  1.70* 1.47*  --  1.12    Estimated Creatinine Clearance: 51.4 ml/min (by C-G formula based on Cr of 1.12).   Medications:  Scheduled:  . bimatoprost  1 drop Both Eyes QHS  . brimonidine  1 drop Both Eyes BID  . budesonide-formoterol  1 puff Inhalation BID  . cefTRIAXone (ROCEPHIN)  IV  1 g Intravenous Q24H  . digoxin  125 mcg Oral Daily  . dorzolamide  1 drop Left Eye BID  . dorzolamide  1 drop Left Eye BID  . folic acid-pyridoxine-cyancobalamin  1 tablet Oral Daily  . furosemide  80 mg Intravenous Once  . levothyroxine  125 mcg Oral QAC breakfast  . magnesium oxide  400 mg Oral Daily  . omega-3 acid ethyl esters  1 g Oral Daily  . sodium chloride  3 mL Intravenous Q12H  . tamsulosin  0.4 mg Oral QPC supper  . timolol  1 drop Both Eyes BID  . Warfarin - Pharmacist Dosing Inpatient   Does not apply q1800    Assessment: 79yo male with AFib on chronic Coumadin which had been held for ICD extraction and resumed yesterday.  Heparin was added for (+)DVT last PM.  INR 1.38 this AM, Hg ~stable and pltc with upward trend.  No bleeding noted.  Goal of Therapy:  INR 2-3 and Heparin level 0.3-0.7 Monitor platelets by anticoagulation protocol:  Yes   Plan:  1.  Coumadin 5mg  today 2.  F/U heparin level to be drawn this afternoon 3.  Continue daily labs  Marisue Humble, PharmD Clinical Pharmacist [facility]

## 2013-06-02 NOTE — Progress Notes (Signed)
Coude catheter placed by Gean Maidens, RN & Gwenevere Abbot, RN from [facility].  600 cc clear urine return.

## 2013-06-02 NOTE — Progress Notes (Signed)
[facility]  DAILY PROGRESS NOTE   Subjective:  He denies dyspnea at rest, but is still clearly tachypneic. Can speak in longer sentences without taking a breath, compared to yesterday. Lots of trouble with urination, now has urinary catheter (which is a painful experience). Negative 1.9 L post diuretics, renal function has improved  Objective:  Temp:  [97.5 F (36.4 C)-98.7 F (37.1 C)] 97.7 F (36.5 C) (07/20 0449) Pulse Rate:  [80-82] 80 (07/20 0449) Resp:  [18] 18 (07/20 0449) BP: (111-123)/(65-86) 115/67 mmHg (07/20 0449) SpO2:  [97 %-100 %] 99 % (07/20 0449) Weight:  [176 lb 6.4 oz (80.015 kg)] 176 lb 6.4 oz (80.015 kg) (07/19 2055) Weight change: 0 lb (0 kg)  Intake/Output from previous day: 07/19 0701 - 07/20 0700 In: 420 [P.O.:420] Out: 2225 [Urine:2225]  Intake/Output from this shift:    Medications: Current Facility-Administered Medications  Medication Dose Route Frequency Provider Last Rate Last Dose  . albuterol (PROVENTIL HFA;VENTOLIN HFA) 108 (90 BASE) MCG/ACT inhaler 2 puff  2 puff Inhalation Q6H PRN Lynden Oxford, MD      . albuterol (PROVENTIL) (5 MG/ML) 0.5% nebulizer solution 2.5 mg  2.5 mg Nebulization Q6H PRN Nishant Dhungel, MD      . bimatoprost (LUMIGAN) 0.01 % ophthalmic solution 1 drop  1 drop Both Eyes QHS Nishant Dhungel, MD      . brimonidine (ALPHAGAN) 0.2 % ophthalmic solution 1 drop  1 drop Both Eyes BID Nishant Dhungel, MD   1 drop at 06/01/13 2201  . budesonide-formoterol (SYMBICORT) 160-4.5 MCG/ACT inhaler 1 puff  1 puff Inhalation BID Lynden Oxford, MD   1 puff at 06/01/13 0830  . cefTRIAXone (ROCEPHIN) 1 g in dextrose 5 % 50 mL IVPB  1 g Intravenous Q24H Lynden Oxford, MD   1 g at 06/01/13 2041  . digoxin (LANOXIN) tablet 125 mcg  125 mcg Oral Daily Lynden Oxford, MD   125 mcg at 06/01/13 1137  . dorzolamide (TRUSOPT) 2 % ophthalmic solution 1 drop  1 drop Left Eye BID Nishant Dhungel, MD   1 drop at 06/01/13 2129   . dorzolamide (TRUSOPT) 2 % ophthalmic solution 1 drop  1 drop Left Eye BID Nishant Dhungel, MD      . folic acid-pyridoxine-cyancobalamin (FOLTX) 2.5-25-2 MG per tablet 1 tablet  1 tablet Oral Daily Lynden Oxford, MD   1 tablet at 06/01/13 1137  . furosemide (LASIX) tablet 40 mg  40 mg Oral BID Nishant Dhungel, MD      . heparin ADULT infusion 100 units/mL (25000 units/250 mL)  1,400 Units/hr Intravenous Continuous Colleen Can, RPH 14 mL/hr at 06/02/13 0657 1,400 Units/hr at 06/02/13 0657  . HYDROcodone-acetaminophen (NORCO/VICODIN) 5-325 MG per tablet 1 tablet  1 tablet Oral Q6H PRN Nishant Dhungel, MD      . levothyroxine (SYNTHROID, LEVOTHROID) tablet 125 mcg  125 mcg Oral QAC breakfast Lynden Oxford, MD   125 mcg at 06/01/13 1137  . magnesium oxide (MAG-OX) tablet 400 mg  400 mg Oral Daily Nishant Dhungel, MD      . omega-3 acid ethyl esters (LOVAZA) capsule 1 g  1 g Oral Daily Nishant Dhungel, MD      . sodium chloride 0.9 % injection 3 mL  3 mL Intravenous Q12H Lynden Oxford, MD   3 mL at 06/01/13 1138  . tamsulosin (FLOMAX) capsule 0.4 mg  0.4 mg Oral QPC supper Nishant Dhungel, MD   0.4 mg at 06/01/13 1815  .  timolol (TIMOPTIC) 0.5 % ophthalmic solution 1 drop  1 drop Both Eyes BID Nishant Dhungel, MD   1 drop at 06/01/13 2230  . Warfarin - Pharmacist Dosing Inpatient   Does not apply q1800 Colleen Can, [facility]        Physical Exam: General appearance: alert, cooperative, no distress and tachypneic Neck: JVD - 10 cm above sternal notch, no adenopathy, no carotid bruit, supple, symmetrical, trachea midline and thyroid not enlarged, symmetric, no tenderness/mass/nodules Lungs: diminished breath sounds bibasilar, rales bilaterally and wheezes scattered Heart: regular rate and rhythm, S1: normal, S2: paradoxically splitting and S3 present Abdomen: soft, non-tender; bowel sounds normal; no masses,  no organomegaly Extremities: left upper extremity is swollen and slightly  cyanotic, maybe a little more edema than yesterday Pulses: 2+ and symmetric Skin: left arm with peau d'orange and slight cyanosis Neurologic: Alert and oriented X 3, normal strength and tone. Normal symmetric reflexes. Normal coordination and gait  Lab Results: Results for orders placed during the hospital encounter of 05/31/13 (from the past 48 hour(s))  URINALYSIS, ROUTINE W REFLEX MICROSCOPIC     Status: Abnormal   Collection Time    05/31/13  7:26 PM      Result Value Range   Color, Urine AMBER (*) YELLOW   Comment: BIOCHEMICALS MAY BE AFFECTED BY COLOR   APPearance CLOUDY (*) CLEAR   Specific Gravity, Urine 1.015  1.005 - 1.030   pH 5.5  5.0 - 8.0   Glucose, UA NEGATIVE  NEGATIVE mg/dL   Hgb urine dipstick MODERATE (*) NEGATIVE   Bilirubin Urine NEGATIVE  NEGATIVE   Ketones, ur NEGATIVE  NEGATIVE mg/dL   Protein, ur 30 (*) NEGATIVE mg/dL   Urobilinogen, UA 0.2  0.0 - 1.0 mg/dL   Nitrite NEGATIVE  NEGATIVE   Leukocytes, UA MODERATE (*) NEGATIVE  URINE MICROSCOPIC-ADD ON     Status: Abnormal   Collection Time    05/31/13  7:26 PM      Result Value Range   Squamous Epithelial / LPF FEW (*) RARE   WBC, UA 21-50  <3 WBC/hpf   RBC / HPF 3-6  <3 RBC/hpf   Bacteria, UA FEW (*) RARE   Casts HYALINE CASTS (*) NEGATIVE   Urine-Other SPERM PRESENT    URINE CULTURE     Status: None   Collection Time    05/31/13  7:26 PM      Result Value Range   Specimen Description URINE, CLEAN CATCH     Special Requests ADDED 0045 06/01/13     Culture  Setup Time 06/01/2013 00:40     Colony Count >=100,000 COLONIES/ML     Culture PSEUDOMONAS AERUGINOSA     Report Status PENDING    CBC WITH DIFFERENTIAL     Status: Abnormal   Collection Time    05/31/13  7:51 PM      Result Value Range   WBC 11.7 (*) 4.0 - 10.5 K/uL   RBC 3.88 (*) 4.22 - 5.81 MIL/uL   Hemoglobin 11.4 (*) 13.0 - 17.0 g/dL   HCT 78.2 (*) 95.6 - 21.3 %   MCV 87.6  78.0 - 100.0 fL   MCH 29.4  26.0 - 34.0 pg   MCHC 33.5  30.0  - 36.0 g/dL   RDW 08.6 (*) 57.8 - 46.9 %   Platelets 119 (*) 150 - 400 K/uL   Comment: CONSISTENT WITH PREVIOUS RESULT   Neutrophils Relative % 88 (*) 43 - 77 %   Neutro  Abs 10.3 (*) 1.7 - 7.7 K/uL   Lymphocytes Relative 3 (*) 12 - 46 %   Lymphs Abs 0.3 (*) 0.7 - 4.0 K/uL   Monocytes Relative 9  3 - 12 %   Monocytes Absolute 1.1 (*) 0.1 - 1.0 K/uL   Eosinophils Relative 0  0 - 5 %   Eosinophils Absolute 0.0  0.0 - 0.7 K/uL   Basophils Relative 0  0 - 1 %   Basophils Absolute 0.0  0.0 - 0.1 K/uL  PRO B NATRIURETIC PEPTIDE     Status: Abnormal   Collection Time    05/31/13  7:51 PM      Result Value Range   Pro B Natriuretic peptide (BNP) 8821.0 (*) 0 - 450 pg/mL  POCT I-STAT, CHEM 8     Status: Abnormal   Collection Time    05/31/13  8:01 PM      Result Value Range   Sodium 133 (*) 135 - 145 mEq/L   Potassium 5.2 (*) 3.5 - 5.1 mEq/L   Chloride 100  96 - 112 mEq/L   BUN 47 (*) 6 - 23 mg/dL   Creatinine, Ser 4.09 (*) 0.50 - 1.35 mg/dL   Glucose, Bld 811 (*) 70 - 99 mg/dL   Calcium, Ion 9.14  7.82 - 1.30 mmol/L   TCO2 22  0 - 100 mmol/L   Hemoglobin 11.9 (*) 13.0 - 17.0 g/dL   HCT 95.6 (*) 21.3 - 08.6 %  CBC     Status: Abnormal   Collection Time    06/01/13  4:00 AM      Result Value Range   WBC 11.7 (*) 4.0 - 10.5 K/uL   RBC 3.80 (*) 4.22 - 5.81 MIL/uL   Hemoglobin 11.2 (*) 13.0 - 17.0 g/dL   HCT 57.8 (*) 46.9 - 62.9 %   MCV 88.7  78.0 - 100.0 fL   MCH 29.5  26.0 - 34.0 pg   MCHC 33.2  30.0 - 36.0 g/dL   RDW 52.8 (*) 41.3 - 24.4 %   Platelets 124 (*) 150 - 400 K/uL  TSH     Status: None   Collection Time    06/01/13  4:00 AM      Result Value Range   TSH 0.718  0.350 - 4.500 uIU/mL  COMPREHENSIVE METABOLIC PANEL     Status: Abnormal   Collection Time    06/01/13  4:00 AM      Result Value Range   Sodium 132 (*) 135 - 145 mEq/L   Potassium 5.2 (*) 3.5 - 5.1 mEq/L   Chloride 96  96 - 112 mEq/L   CO2 23  19 - 32 mEq/L   Glucose, Bld 132 (*) 70 - 99 mg/dL   BUN  52 (*) 6 - 23 mg/dL   Creatinine, Ser 0.10 (*) 0.50 - 1.35 mg/dL   Calcium 9.3  8.4 - 27.2 mg/dL   Total Protein 7.0  6.0 - 8.3 g/dL   Albumin 3.4 (*) 3.5 - 5.2 g/dL   AST 536 (*) 0 - 37 U/L   ALT 120 (*) 0 - 53 U/L   Alkaline Phosphatase 107  39 - 117 U/L   Total Bilirubin 1.4 (*) 0.3 - 1.2 mg/dL   GFR calc non Af Amer 42 (*) >90 mL/min   GFR calc Af Amer 48 (*) >90 mL/min   Comment:            The eGFR has been calculated  using the CKD EPI equation.     This calculation has not been     validated in all clinical     situations.     eGFR's persistently     <90 mL/min signify     possible Chronic Kidney Disease.  PRO B NATRIURETIC PEPTIDE     Status: Abnormal   Collection Time    06/01/13  4:30 AM      Result Value Range   Pro B Natriuretic peptide (BNP) 9189.0 (*) 0 - 450 pg/mL  PROTIME-INR     Status: Abnormal   Collection Time    06/01/13  4:30 AM      Result Value Range   Prothrombin Time 16.1 (*) 11.6 - 15.2 seconds   INR 1.32  0.00 - 1.49  PROTIME-INR     Status: Abnormal   Collection Time    06/02/13  6:00 AM      Result Value Range   Prothrombin Time 16.6 (*) 11.6 - 15.2 seconds   INR 1.38  0.00 - 1.49  BASIC METABOLIC PANEL     Status: Abnormal   Collection Time    06/02/13  6:00 AM      Result Value Range   Sodium 133 (*) 135 - 145 mEq/L   Potassium 3.8  3.5 - 5.1 mEq/L   Chloride 99  96 - 112 mEq/L   CO2 23  19 - 32 mEq/L   Glucose, Bld 112 (*) 70 - 99 mg/dL   BUN 46 (*) 6 - 23 mg/dL   Creatinine, Ser 1.61  0.50 - 1.35 mg/dL   Calcium 8.6  8.4 - 09.6 mg/dL   GFR calc non Af Amer 58 (*) >90 mL/min   GFR calc Af Amer 67 (*) >90 mL/min   Comment:            The eGFR has been calculated     using the CKD EPI equation.     This calculation has not been     validated in all clinical     situations.     eGFR's persistently     <90 mL/min signify     possible Chronic Kidney Disease.  CBC     Status: Abnormal   Collection Time    06/02/13  6:00 AM       Result Value Range   WBC 6.7  4.0 - 10.5 K/uL   RBC 3.65 (*) 4.22 - 5.81 MIL/uL   Hemoglobin 10.9 (*) 13.0 - 17.0 g/dL   HCT 04.5 (*) 40.9 - 81.1 %   MCV 87.4  78.0 - 100.0 fL   MCH 29.9  26.0 - 34.0 pg   MCHC 34.2  30.0 - 36.0 g/dL   RDW 91.4 (*) 78.2 - 95.6 %   Platelets 126 (*) 150 - 400 K/uL  HEPARIN LEVEL (UNFRACTIONATED)     Status: Abnormal   Collection Time    06/02/13  6:00 AM      Result Value Range   Heparin Unfractionated 0.26 (*) 0.30 - 0.70 IU/mL   Comment:            IF HEPARIN RESULTS ARE BELOW     EXPECTED VALUES, AND PATIENT     DOSAGE HAS BEEN CONFIRMED,     SUGGEST FOLLOW UP TESTING     OF ANTITHROMBIN III LEVELS.  HEPATIC FUNCTION PANEL     Status: Abnormal   Collection Time    06/02/13  6:00 AM  Result Value Range   Total Protein 6.3  6.0 - 8.3 g/dL   Albumin 3.0 (*) 3.5 - 5.2 g/dL   AST 161 (*) 0 - 37 U/L   ALT 93 (*) 0 - 53 U/L   Alkaline Phosphatase 103  39 - 117 U/L   Total Bilirubin 1.1  0.3 - 1.2 mg/dL   Bilirubin, Direct 0.5 (*) 0.0 - 0.3 mg/dL   Indirect Bilirubin 0.6  0.3 - 0.9 mg/dL    Imaging: Ct Angio Chest Pe W/cm &/or Wo Cm  05/31/2013   *RADIOLOGY REPORT*  Clinical Data: Recent AICD lead removal.  History of a cardiomyopathy.  CT ANGIOGRAPHY CHEST  Technique:  Multidetector CT imaging of the chest using the standard protocol during bolus administration of intravenous contrast. Multiplanar reconstructed images including MIPs were obtained and reviewed to evaluate the vascular anatomy.  Contrast: 80mL OMNIPAQUE IOHEXOL 350 MG/ML SOLN  Comparison: Chest radiograph, 05/28/2013  Findings: No evidence of a pulmonary embolus.  There is moderate enlargement of the heart.  There are dense coronary artery calcifications and changes from previous CABG surgery.  No mediastinal or hilar masses are appreciated and there are no pathologically enlarged lymph nodes.  There is lung base subsegmental atelectasis.  There are mild areas of peripheral  interstitial thickening which appear chronic.  No infiltrate or pulmonary edema.  There is no pleural effusion or pneumothorax.  Limited evaluation of the upper abdomen is unremarkable.  There is a burst type fracture of T7, which appears old.  This was present on the prior chest radiograph.  IMPRESSION: No evidence of a pulmonary embolus.  Moderate cardiomegaly.  Mild lung base subsegmental atelectasis. No edema or infiltrate.   Original Report Authenticated By: Amie Portland, M.D.   No evidence of deep vein or superficial thrombosis involving the right lower extremity and left common femoral vein.  Findings consistent with acute, occlusive deep vein thrombosis involving the internal jugular vein, and non occlusive deep vein thrombosis involving the subclavian and axillary veins of theleft upper extremity.   Assessment:  1. Principal Problem: 2.   Urinary tract infection 3. Active Problems: 4.   CAD- CABG X 4 '95, patent grafts '09 5.   COPD 6.   Congestive heart failure 7.   Long term (current) use of anticoagulants 8.   Left arm swelling 9.   Acute on chronic combined systolic and diastolic CHF (congestive heart failure) 10.   Plan: Acute on chronic systolic heart failure, precipitated by withdrawal of CRT Left IJ/subclavian DVT  1. Continue with IV diuretics - he is still markedly hypervolemic 2. Heparin IV to warfarin; if CHF is readily compensated, may DC on Lovenox/warfarin overlap, but he is not ready for DC yet 3. Recheck BMET in AM to look for CIN, 48 h post contrast study  Time Spent Directly with Patient:  30 minutes  Length of Stay:  LOS: 2 days    Luverne Farone 06/02/2013, 9:28 AM

## 2013-06-02 NOTE — Progress Notes (Signed)
[facility] PROGRESS NOTE  [name] ZOX:[id number] DOB: 02-23-1926 DOA: 05/31/2013 PCP: Dylan Mackintosh, MD   Assessment/Plan:  UTI  cx growing pseudomonas. Pending sensitivity. Will switch abx  to zosyn Tylenol prn for fever   left upper arm swelling, right leg swelling  The patient mentions that both swelling has been new since recent procedure to remove infected AICD. doppler LUE showed acute DVT in left IJ.  No signs of infection  - not taking his warfarin since last few days  -CT angio chest was negative for PE.  -Resumed coumadin. Bridging with IV heparin  AKI  possibly from UTI and over diuresis  holding captopril. Reduce dose of lasix but patient now having fluid retention. Will increase lasix back to 40 gm bid. Given single dose of 80 mg IV lasix today.  Renal fn improved this am  Holding digoxin  CHF and CAD  - history of significant CHF with low ejection fraction.  Fluid rentention today. increased lasix dose back to home dose. (changed to 40 mg bid)  Daily I.'s and O. daily weight  Continue digoxin .  Appreciate cardiology recs   Urinary retention Possibly from UTI vs BPH. Added flomax. Required foley due to significant bladder retention. Check USG   Hypothyroidism  Continue Synthroid   Recent extraction of AICD  He currently has a temporary pacemaker . Being followed by cardiology  Hyperkalemia  ordered kayexalate   DVT Prophylaxis: Heparin     Code Status: Full    Code Status: full  Family Communication: daughter  at bedside  Disposition Plan: home once stable   Consultants:  [facility] Procedures:  none Antibiotics:  Rocephin   HPI/Subjective:  Admission H&P reviewed   Objective: Filed Vitals:   06/02/13 0939 06/02/13 1030 06/02/13 1033 06/02/13 1035  BP:  122/72 123/76 124/64  Pulse:  78 79 80  Temp:   98 F (36.7 C)   TempSrc:   Oral   Resp:  17 17 18   Height:      Weight:      SpO2: 100% 100% 100% 97%     Intake/Output Summary (Last 24 hours) at 06/02/13 1202 Last data filed at 06/02/13 0900  Gross per 24 hour  Intake    420 ml  Output   2225 ml  Net  -1805 ml   Filed Weights   06/01/13 0113 06/01/13 2055  Weight: 80.015 kg (176 lb 6.4 oz) 80.015 kg (176 lb 6.4 oz)    Exam:  General: Elderly male in NAD  HEENT: no pallor, moist oral mucosa  Chest: clear breath sounds b/l, no added sounds  Cardiovascular: NS1&S2, no murmurs. Pacemaker site clean  Abdomen: soft, NT, ND, BS+  Musculoskeletal: ext; swelling over left UE. Trace LE edema  CNS: aaox3   Data Reviewed: Basic Metabolic Panel:  Recent Labs Lab 05/28/13 0800 05/29/13 0120 05/31/13 2001 06/01/13 0400 06/02/13 0600  NA 136 134* 133* 132* 133*  K 4.2 4.0 5.2* 5.2* 3.8  CL 101 99 100 96 99  CO2 24 26  --  23 23  GLUCOSE 159* 139* 154* 132* 112*  BUN 27* 31* 47* 52* 46*  CREATININE 1.13 1.30 1.70* 1.47* 1.12  CALCIUM 8.7 8.5  --  9.3 8.6   Liver Function Tests:  Recent Labs Lab 06/01/13 0400 06/02/13 0600  AST 220* 106*  ALT 120* 93*  ALKPHOS 107 103  BILITOT 1.4* 1.1  PROT 7.0 6.3  ALBUMIN 3.4* 3.0*   No results found  for this basename: LIPASE, AMYLASE,  in the last 168 hours No results found for this basename: AMMONIA,  in the last 168 hours CBC:  Recent Labs Lab 05/28/13 0800 05/29/13 0120 05/31/13 1951 05/31/13 2001 06/01/13 0400 06/02/13 0600  WBC 5.7 5.8 11.7*  --  11.7* 6.7  NEUTROABS  --   --  10.3*  --   --   --   HGB 11.3* 10.7* 11.4* 11.9* 11.2* 10.9*  HCT 34.5* 31.4* 34.0* 35.0* 33.7* 31.9*  MCV 88.9 87.5 87.6  --  88.7 87.4  PLT 102* 89* 119*  --  124* 126*   Cardiac Enzymes: No results found for this basename: CKTOTAL, CKMB, CKMBINDEX, TROPONINI,  in the last 168 hours BNP (last 3 results)  Recent Labs  05/31/13 1951 06/01/13 0430  PROBNP 8821.0* 9189.0*   CBG: No results found for this basename: GLUCAP,  in the last 168 hours  Recent Results (from the past 240  hour(s))  MRSA PCR SCREENING     Status: None   Collection Time    05/27/13  8:42 PM      Result Value Range Status   MRSA by PCR NEGATIVE  NEGATIVE Final   Comment:            The GeneXpert MRSA Assay (FDA     approved for NASAL specimens     only), is one component of a     comprehensive MRSA colonization     surveillance program. It is not     intended to diagnose MRSA     infection nor to guide or     monitor treatment for     MRSA infections.  URINE CULTURE     Status: None   Collection Time    05/31/13  7:26 PM      Result Value Range Status   Specimen Description URINE, CLEAN CATCH   Final   Special Requests ADDED 0045 06/01/13   Final   Culture  Setup Time 06/01/2013 00:40   Final   Colony Count >=100,000 COLONIES/ML   Final   Culture PSEUDOMONAS AERUGINOSA   Final   Report Status PENDING   Incomplete     Studies: Ct Angio Chest Pe W/cm &/or Wo Cm  05/31/2013   *RADIOLOGY REPORT*  Clinical Data: Recent AICD lead removal.  History of a cardiomyopathy.  CT ANGIOGRAPHY CHEST  Technique:  Multidetector CT imaging of the chest using the standard protocol during bolus administration of intravenous contrast. Multiplanar reconstructed images including MIPs were obtained and reviewed to evaluate the vascular anatomy.  Contrast: 80mL OMNIPAQUE IOHEXOL 350 MG/ML SOLN  Comparison: Chest radiograph, 05/28/2013  Findings: No evidence of a pulmonary embolus.  There is moderate enlargement of the heart.  There are dense coronary artery calcifications and changes from previous CABG surgery.  No mediastinal or hilar masses are appreciated and there are no pathologically enlarged lymph nodes.  There is lung base subsegmental atelectasis.  There are mild areas of peripheral interstitial thickening which appear chronic.  No infiltrate or pulmonary edema.  There is no pleural effusion or pneumothorax.  Limited evaluation of the upper abdomen is unremarkable.  There is a burst type fracture of T7,  which appears old.  This was present on the prior chest radiograph.  IMPRESSION: No evidence of a pulmonary embolus.  Moderate cardiomegaly.  Mild lung base subsegmental atelectasis. No edema or infiltrate.   Original Report Authenticated By: Amie Portland, M.D.    Scheduled Meds: . bimatoprost  1 drop Both Eyes QHS  . brimonidine  1 drop Both Eyes BID  . budesonide-formoterol  1 puff Inhalation BID  . cefTRIAXone (ROCEPHIN)  IV  1 g Intravenous Q24H  . digoxin  125 mcg Oral Daily  . dorzolamide  1 drop Left Eye BID  . folic acid-pyridoxine-cyancobalamin  1 tablet Oral Daily  . levothyroxine  125 mcg Oral QAC breakfast  . magnesium oxide  400 mg Oral Daily  . omega-3 acid ethyl esters  1 g Oral Daily  . sodium chloride  3 mL Intravenous Q12H  . tamsulosin  0.4 mg Oral QPC supper  . timolol  1 drop Both Eyes BID  . warfarin  5 mg Oral ONCE-1800  . Warfarin - Pharmacist Dosing Inpatient   Does not apply q1800   Continuous Infusions: . heparin 1,400 Units/hr (06/02/13 0946)      Time spent: 25 minutes    [name]  [facility] Pager [phone] If 7PM-7AM, please contact night-coverage at [email or web address], password [facility] 06/02/2013, 12:02 PM  LOS: 2 days

## 2013-06-03 ENCOUNTER — Other Ambulatory Visit: Payer: Self-pay | Admitting: *Deleted

## 2013-06-03 DIAGNOSIS — I4891 Unspecified atrial fibrillation: Secondary | ICD-10-CM

## 2013-06-03 DIAGNOSIS — I82A19 Acute embolism and thrombosis of unspecified axillary vein: Secondary | ICD-10-CM

## 2013-06-03 DIAGNOSIS — R339 Retention of urine, unspecified: Secondary | ICD-10-CM | POA: Diagnosis present

## 2013-06-03 LAB — URINE CULTURE

## 2013-06-03 LAB — PROTIME-INR: INR: 1.33 (ref 0.00–1.49)

## 2013-06-03 LAB — BASIC METABOLIC PANEL
CO2: 27 mEq/L (ref 19–32)
Calcium: 8.5 mg/dL (ref 8.4–10.5)
Creatinine, Ser: 1.08 mg/dL (ref 0.50–1.35)
Glucose, Bld: 98 mg/dL (ref 70–99)

## 2013-06-03 LAB — HEPARIN LEVEL (UNFRACTIONATED): Heparin Unfractionated: 0.36 IU/mL (ref 0.30–0.70)

## 2013-06-03 MED ORDER — CIPROFLOXACIN HCL 500 MG PO TABS
500.0000 mg | ORAL_TABLET | Freq: Two times a day (BID) | ORAL | Status: DC
  Administered 2013-06-03 – 2013-06-04 (×3): 500 mg via ORAL
  Filled 2013-06-03 (×5): qty 1

## 2013-06-03 MED ORDER — ENOXAPARIN SODIUM 120 MG/0.8ML ~~LOC~~ SOLN
120.0000 mg | SUBCUTANEOUS | Status: DC
  Administered 2013-06-03 – 2013-06-04 (×2): 120 mg via SUBCUTANEOUS
  Filled 2013-06-03 (×2): qty 0.8

## 2013-06-03 MED ORDER — POTASSIUM CHLORIDE CRYS ER 20 MEQ PO TBCR
40.0000 meq | EXTENDED_RELEASE_TABLET | Freq: Once | ORAL | Status: AC
  Administered 2013-06-03: 40 meq via ORAL
  Filled 2013-06-03: qty 2

## 2013-06-03 MED ORDER — DIGOXIN 125 MCG PO TABS: 125.0000 ug | ORAL_TABLET | Freq: Every day | ORAL | Status: DC

## 2013-06-03 MED ORDER — FUROSEMIDE 80 MG PO TABS
80.0000 mg | ORAL_TABLET | Freq: Every day | ORAL | Status: DC
  Administered 2013-06-03 – 2013-06-04 (×2): 80 mg via ORAL
  Filled 2013-06-03 (×2): qty 1

## 2013-06-03 MED ORDER — WARFARIN SODIUM 5 MG PO TABS
5.0000 mg | ORAL_TABLET | Freq: Once | ORAL | Status: AC
  Administered 2013-06-03: 5 mg via ORAL
  Filled 2013-06-03: qty 1

## 2013-06-03 NOTE — Progress Notes (Signed)
Physical Therapy Treatment and Discharge from Acute PT Patient Details Name: [name] MRN: [id number] DOB: [birth year] Today's Date: 06/03/2013 Time: 4098-1191 PT Time Calculation (min): 21 min  PT Assessment / Plan / Recommendation  PT Comments   Pt doing well with mobility.  Currently modified independent however reported slight lightheadedness end of ambulation which resolved in 1-2 minutes.  Pt aware to wait for dizziness to resolve if occurs with position changes before continuing with mobility and perform ankle pumps or marching.  Pt also educated in performing hip flexion, LAQ and ankle pumps x20 seated during hospitalization.  All education complete and goals met so will d/c from acute PT.   Follow Up Recommendations  No PT follow up     Does the patient have the potential to tolerate intense rehabilitation     Barriers to Discharge        Equipment Recommendations  None recommended by PT    Recommendations for Other Services    Frequency     Progress towards PT Goals Progress towards PT goals: Goals met/education completed, patient discharged from PT  Plan Other (comment) (d/c from therapy)    Precautions / Restrictions Precautions Precautions: None Restrictions Other Position/Activity Restrictions: restricted mvmt of LUE   Pertinent Vitals/Pain BP: Sitting 107/69 mmHg  Standing 99/62 mmHg  (daughter reports pt runs low) Standing after ambulation 124/76 mmHg HR 81 SaO2 99% room air   Mobility  Bed Mobility Bed Mobility: Not assessed Transfers Transfers: Sit to Stand;Stand to Sit Sit to Stand: 6: Modified independent (Device/Increase time);From chair/3-in-1;With upper extremity assist Stand to Sit: 6: Modified independent (Device/Increase time);To chair/3-in-1;With upper extremity assist Details for Transfer Assistance: pt denies dizziness upon standing Ambulation/Gait Ambulation/Gait Assistance: 6: Modified independent (Device/Increase time) Ambulation  Distance (Feet): 280 Feet Ambulation/Gait Assistance Details: pt occasionally pushed IV pole however steady without UE support, pt denied dizziness until very end of ambulation and requested to stand still for "not much" lightheadness which only lasted 1-2 min, no nystagmus observed during session and pt able to perform head shaking without symptoms in standing Gait Pattern: Step-through pattern Gait velocity: decreased General Gait Details: no LOB, reports ambulating with daughter this morning, encouraged continuing to ambulate with family/staff during acute stay    Exercises     PT Diagnosis:    PT Problem List:   PT Treatment Interventions:     PT Goals (current goals can now be found in the care plan section)    Visit Information  Last PT Received On: 06/03/13 Assistance Needed: +1 History of Present Illness: Pt was recently admitted for change of changing of infected AICD and now returns with symptoms of UTI and swelling of the LUE with DVT left internal jugular. Now on heparin    Subjective Data      Cognition  Cognition Arousal/Alertness: Awake/alert Behavior During Therapy: WFL for tasks assessed/performed Overall Cognitive Status: Within Functional Limits for tasks assessed    Balance     End of Session PT - End of Session Activity Tolerance: Patient tolerated treatment well Patient left: in chair;with call bell/phone within reach;with family/visitor present   GP     Armella Stogner,KATHrine E 06/03/2013, 12:11 PM Zenovia Jarred, PT, DPT 06/03/2013 Pager: [phone]

## 2013-06-03 NOTE — Progress Notes (Signed)
[facility] PROGRESS NOTE  [name] WUJ:[id number] DOB: 07-25-1926 DOA: 05/31/2013 PCP: Margaree Mackintosh, MD   Brief narrative:   79 y.o. male who was recently admitted in the hospital for removal of infected AICD and replacement of temporary pacemaker. Marland Kitchen His warfarin was on hold off for the procedure. Since the discharge she has been complaining of burning urination and some blood in the urine. He also had a progressive worsening of shortness of breath with clear mucus production without any blood. He has noted significant swelling of his left upper extremity off of the procedure.    Assessment/Plan:  UTI  cx growing pseudomonas. pan sensitive to cipro.  Tylenol prn for fever .  left upper arm swelling, right leg swelling  Swelling since removed infected AICD. doppler LUE showed acute DVT in left IJ. No signs of infection.  - not taking his warfarin since last few days  -CT angio chest was negative for PE.  -Resumed coumadin. Bridging with IV heparin . Renal fn resolved . Will switch to lovenox.  left arm swelling much improved.   AKI  possibly from UTI and over diuresis  Held captopril. Reduce dose of lasix . Now resolved and meds resumed.   CHF and CAD  - history of significant CHF with low ejection fraction.  Fluid rentention today. increased lasix dose back to home dose. (changed to 40 mg bid)  Daily I.'s and O. daily weight  Continue digoxin .  Appreciate cardiology recs   Urinary retention  Possibly from UTI vs BPH. Added flomax. Required foley due to significant bladder retention. USG unremarkable. Will attempt voiding trial in am.  Hypothyroidism  Continue Synthroid   Recent extraction of AICD  He currently has a temporary pacemaker . Being followed by cardiology   Hyperkalemia / hypokalemia Currently stable   DVT Prophylaxis: Heparin  Code Status: Full    Code Status: full  Family Communication: daughter at bedside  Disposition Plan: home likely  tomorrow Consultants:  [facility] Procedures:  none Antibiotics:  IV cipro (7/21)  HPI/Subjective:  Feels better. LUE swelling improved. SOB improved as well   Objective: Filed Vitals:   06/02/13 1342 06/02/13 2142 06/03/13 0604 06/03/13 1030  BP: 113/57 104/67 102/58 124/74  Pulse: 79 80 82 86  Temp: 98.5 F (36.9 C) 98.2 F (36.8 C) 98.1 F (36.7 C) 98.3 F (36.8 C)  TempSrc: Oral Oral Oral Oral  Resp: 18 18 20 18   Height:      Weight:  79.878 kg (176 lb 1.6 oz)    SpO2: 98% 98% 100% 100%    Intake/Output Summary (Last 24 hours) at 06/03/13 1238 Last data filed at 06/03/13 0900  Gross per 24 hour  Intake    240 ml  Output   2700 ml  Net  -2460 ml   Filed Weights   06/01/13 0113 06/01/13 2055 06/02/13 2142  Weight: 80.015 kg (176 lb 6.4 oz) 80.015 kg (176 lb 6.4 oz) 79.878 kg (176 lb 1.6 oz)    Exam: General: Elderly male in NAD  HEENT: no pallor, moist oral mucosa  Chest: clear breath sounds b/l, no added sounds  Cardiovascular: NS1&S2, no murmurs. Pacemaker site clean  Abdomen: soft, NT, ND, BS+ , foley in place Musculoskeletal: ext; swelling over left UE improved . Trace LE edema  CNS: aaox3     Data Reviewed: Basic Metabolic Panel:  Recent Labs Lab 05/28/13 0800 05/29/13 0120 05/31/13 2001 06/01/13 0400 06/02/13 0600 06/03/13 0435  NA 136  134* 133* 132* 133* 132*  K 4.2 4.0 5.2* 5.2* 3.8 3.3*  CL 101 99 100 96 99 96  CO2 24 26  --  23 23 27   GLUCOSE 159* 139* 154* 132* 112* 98  BUN 27* 31* 47* 52* 46* 42*  CREATININE 1.13 1.30 1.70* 1.47* 1.12 1.08  CALCIUM 8.7 8.5  --  9.3 8.6 8.5   Liver Function Tests:  Recent Labs Lab 06/01/13 0400 06/02/13 0600  AST 220* 106*  ALT 120* 93*  ALKPHOS 107 103  BILITOT 1.4* 1.1  PROT 7.0 6.3  ALBUMIN 3.4* 3.0*   No results found for this basename: LIPASE, AMYLASE,  in the last 168 hours No results found for this basename: AMMONIA,  in the last 168 hours CBC:  Recent Labs Lab 05/28/13 0800  05/29/13 0120 05/31/13 1951 05/31/13 2001 06/01/13 0400 06/02/13 0600  WBC 5.7 5.8 11.7*  --  11.7* 6.7  NEUTROABS  --   --  10.3*  --   --   --   HGB 11.3* 10.7* 11.4* 11.9* 11.2* 10.9*  HCT 34.5* 31.4* 34.0* 35.0* 33.7* 31.9*  MCV 88.9 87.5 87.6  --  88.7 87.4  PLT 102* 89* 119*  --  124* 126*   Cardiac Enzymes: No results found for this basename: CKTOTAL, CKMB, CKMBINDEX, TROPONINI,  in the last 168 hours BNP (last 3 results)  Recent Labs  05/31/13 1951 06/01/13 0430  PROBNP 8821.0* 9189.0*   CBG: No results found for this basename: GLUCAP,  in the last 168 hours  Recent Results (from the past 240 hour(s))  MRSA PCR SCREENING     Status: None   Collection Time    05/27/13  8:42 PM      Result Value Range Status   MRSA by PCR NEGATIVE  NEGATIVE Final   Comment:            The GeneXpert MRSA Assay (FDA     approved for NASAL specimens     only), is one component of a     comprehensive MRSA colonization     surveillance program. It is not     intended to diagnose MRSA     infection nor to guide or     monitor treatment for     MRSA infections.  URINE CULTURE     Status: None   Collection Time    05/31/13  7:26 PM      Result Value Range Status   Specimen Description URINE, CLEAN CATCH   Final   Special Requests ADDED 0045 06/01/13   Final   Culture  Setup Time 06/01/2013 00:40   Final   Colony Count >=100,000 COLONIES/ML   Final   Culture PSEUDOMONAS AERUGINOSA   Final   Report Status 06/03/2013 FINAL   Final   Organism ID, Bacteria PSEUDOMONAS AERUGINOSA   Final     Studies: US Renal  06/02/2013   *RADIOLOGY REPORT*  Clinical Data:  Urinary retention.  RENAL/URINARY TRACT ULTRASOUND COMPLETE  Comparison:  None.  Findings:  Right Kidney:  Measures 9.5 cm.  Normal in size and parenchymal echogenicity.  No evidence of mass or hydronephrosis.  Left Kidney:  Measures 11.6 cm.  Normal in size and parenchymal echogenicity.  No evidence of mass or hydronephrosis.   Bladder:  Appears normal for degree of bladder distention.Collapsed around a Foley catheter.  Prostate gland measures 4.8 x 3.1 x 3.0 cm.  Ascites identified.  IMPRESSION:  1.  No acute findings 2.  Ascites.   Original Report Authenticated By: Signa Kell, M.D.    Scheduled Meds: . bimatoprost  1 drop Both Eyes QHS  . brimonidine  1 drop Both Eyes BID  . budesonide-formoterol  1 puff Inhalation BID  . ciprofloxacin  500 mg Oral BID  . dorzolamide  1 drop Left Eye BID  . enoxaparin (LOVENOX) injection  120 mg Subcutaneous Q24H  . folic acid-pyridoxine-cyancobalamin  1 tablet Oral Daily  . furosemide  80 mg Oral Daily  . levothyroxine  125 mcg Oral QAC breakfast  . magnesium oxide  400 mg Oral Daily  . omega-3 acid ethyl esters  1 g Oral Daily  . sodium chloride  3 mL Intravenous Q12H  . tamsulosin  0.4 mg Oral QPC supper  . timolol  1 drop Both Eyes BID  . warfarin  5 mg Oral ONCE-1800  . Warfarin - Pharmacist Dosing Inpatient   Does not apply q1800   Continuous Infusions:     Time spent: 25 minutes    [name]  [facility] Pager [phone] If 7PM-7AM, please contact night-coverage at [email or web address], password [email or web address] 06/03/2013, 12:38 PM  LOS: 3 days

## 2013-06-03 NOTE — Progress Notes (Signed)
ANTICOAGULATION CONSULT NOTE - Initial Consult  Pharmacy Consult for Lovenox Indication: atrial fibrillation and DVT  No Known Allergies  Patient Measurements: Height: 5\' 11"  (180.3 cm) Weight: 176 lb 1.6 oz (79.878 kg) IBW/kg (Calculated) : 75.3   Vital Signs: Temp: 98.1 F (36.7 C) (07/21 0604) Temp src: Oral (07/21 0604) BP: 102/58 mmHg (07/21 0604) Pulse Rate: 82 (07/21 0604)  Labs:  Recent Labs  05/31/13 1951  05/31/13 2001 06/01/13 0400 06/01/13 0430 06/02/13 0600 06/02/13 1603 06/03/13 0435  HGB 11.4*  --  11.9* 11.2*  --  10.9*  --   --   HCT 34.0*  --  35.0* 33.7*  --  31.9*  --   --   PLT 119*  --   --  124*  --  126*  --   --   LABPROT  --   --   --   --  16.1* 16.6*  --  16.2*  INR  --   --   --   --  1.32 1.38  --  1.33  HEPARINUNFRC  --   --   --   --   --  0.26* 0.39 0.36  CREATININE  --   < > 1.70* 1.47*  --  1.12  --  1.08  < > = values in this interval not displayed.  Estimated Creatinine Clearance: 53.3 ml/min (by C-G formula based on Cr of 1.08).   Medical History: Past Medical History  Diagnosis Date  . Ischemic cardiomyopathy     [employer] Jude BiV ICD  . Paroxysmal atrial fibrillation     sinus rhythm on amiodarone  . Asbestos exposure     plaques on CT  . Compression fracture     T7  . Pacemaker   . Hypertension   . Coronary artery disease   . Arthritis     "little, in my hands" (05/21/2013)  . COPD (chronic obstructive pulmonary disease)     pt denies this hx on 05/21/2013  . Automatic implantable cardioverter-defibrillator in situ   . Hypothyroidism   . CHF (congestive heart failure)     hx.    Assessment: 87 YOM on chronic warfarin (home dose- 5mg  Tues, Thurs, Sun, 2.5 all other days) who now has a new DVT. Was on therapeutic IV heparin, but anticipating discharge, so changed to Lovenox to complete overlap until INR is therapeutic. Will need to overlap with Lovenox for 48hrs after INR is >2. INR today 1.33 after receiving 2 warfarin  doses of 5mg . Note- patient is also on ciprofloxacin for pseudomonas UTI. Platelets are trending up. No bleeding noted.  Goal of Therapy:  INR 2-3 Anti-Xa level 0.6-1.2 units/ml 4hrs after LMWH dose given Monitor platelets by anticoagulation protocol: Yes   Plan:  1. Start Lovenox 120mg  subq Q24 at 1200 today 2. Stop IV heparin at 1100 today 3. Warfarin 5mg  po x1 tonight 4. Daily PT/INR 5. CBC Q3days 6. Will follow along for discharge plans  Di Jasmer D. Kaicen Desena, PharmD Clinical Pharmacist Pager: [phone] 06/03/2013 10:20 AM

## 2013-06-03 NOTE — Progress Notes (Signed)
[facility]  DAILY PROGRESS NOTE   Subjective:  Considerably improved. >2.5 L diuresis. Renal function actually improved Dyspnea improved - able to walk hall without difficulty. Upper extremity edema is resolved. UCx + for pseudomonas (pan sensitive). INR has not budged yet.  Objective:  Temp:  [98 F (36.7 C)-98.5 F (36.9 C)] 98.1 F (36.7 C) (07/21 0604) Pulse Rate:  [78-82] 82 (07/21 0604) Resp:  [17-20] 20 (07/21 0604) BP: (102-124)/(57-76) 102/58 mmHg (07/21 0604) SpO2:  [97 %-100 %] 100 % (07/21 0604) Weight:  [79.878 kg (176 lb 1.6 oz)] 79.878 kg (176 lb 1.6 oz) (07/20 2142) Weight change: -0.136 kg (-4.8 oz)  Intake/Output from previous day: 07/20 0701 - 07/21 0700 In: 120 [P.O.:120] Out: 2700 [Urine:2700]  Intake/Output from this shift:    Medications: Current Facility-Administered Medications  Medication Dose Route Frequency Provider Last Rate Last Dose  . albuterol (PROVENTIL HFA;VENTOLIN HFA) 108 (90 BASE) MCG/ACT inhaler 2 puff  2 puff Inhalation Q6H PRN Lynden Oxford, MD      . albuterol (PROVENTIL) (5 MG/ML) 0.5% nebulizer solution 2.5 mg  2.5 mg Nebulization Q6H PRN Nishant Dhungel, MD      . bimatoprost (LUMIGAN) 0.01 % ophthalmic solution 1 drop  1 drop Both Eyes QHS Nishant Dhungel, MD   1 drop at 06/02/13 2152  . brimonidine (ALPHAGAN) 0.2 % ophthalmic solution 1 drop  1 drop Both Eyes BID Nishant Dhungel, MD   1 drop at 06/02/13 2137  . budesonide-formoterol (SYMBICORT) 160-4.5 MCG/ACT inhaler 1 puff  1 puff Inhalation BID Lynden Oxford, MD   1 puff at 06/03/13 0803  . ciprofloxacin (CIPRO) tablet 500 mg  500 mg Oral BID Nishant Dhungel, MD      . dorzolamide (TRUSOPT) 2 % ophthalmic solution 1 drop  1 drop Left Eye BID Nishant Dhungel, MD   1 drop at 06/02/13 2206  . folic acid-pyridoxine-cyancobalamin (FOLTX) 2.5-25-2 MG per tablet 1 tablet  1 tablet Oral Daily Lynden Oxford, MD   1 tablet at 06/02/13 0934  . heparin ADULT  infusion 100 units/mL (25000 units/250 mL)  1,400 Units/hr Intravenous Continuous Colleen Can, RPH 14 mL/hr at 06/03/13 0402 1,400 Units/hr at 06/03/13 0402  . HYDROcodone-acetaminophen (NORCO/VICODIN) 5-325 MG per tablet 1 tablet  1 tablet Oral Q6H PRN Nishant Dhungel, MD   1 tablet at 06/02/13 1710  . levothyroxine (SYNTHROID, LEVOTHROID) tablet 125 mcg  125 mcg Oral QAC breakfast Lynden Oxford, MD   125 mcg at 06/03/13 [phone]  . magnesium oxide (MAG-OX) tablet 400 mg  400 mg Oral Daily Nishant Dhungel, MD   400 mg at 06/02/13 1057  . omega-3 acid ethyl esters (LOVAZA) capsule 1 g  1 g Oral Daily Nishant Dhungel, MD   1 g at 06/02/13 1057  . sodium chloride 0.9 % injection 3 mL  3 mL Intravenous Q12H Lynden Oxford, MD   3 mL at 06/01/13 1138  . tamsulosin (FLOMAX) capsule 0.4 mg  0.4 mg Oral QPC supper Nishant Dhungel, MD   0.4 mg at 06/02/13 1821  . timolol (TIMOPTIC) 0.5 % ophthalmic solution 1 drop  1 drop Both Eyes BID Nishant Dhungel, MD   1 drop at 06/02/13 2209  . Warfarin - Pharmacist Dosing Inpatient   Does not apply q1800 Colleen Can, [facility]        Physical Exam: General appearance: alert, cooperative and no distress Neck: JVD - 4 cm above sternal notch, no adenopathy, no carotid bruit, supple, symmetrical,  trachea midline and thyroid not enlarged, symmetric, no tenderness/mass/nodules Lungs: clear to auscultation bilaterally Heart: regular rate and rhythm and S2: paradoxically splitting Abdomen: soft, non-tender; bowel sounds normal; no masses,  no organomegaly Extremities: extremities normal, atraumatic, no cyanosis or edema and wrinkles in left arm, cyanosis improved Pulses: 2+ and symmetric Skin: Skin color, texture, turgor normal. No rashes or lesions Neurologic: Grossly normal  Lab Results: Results for orders placed during the hospital encounter of 05/31/13 (from the past 48 hour(s))  PROTIME-INR     Status: Abnormal   Collection Time    06/02/13  6:00 AM       Result Value Range   Prothrombin Time 16.6 (*) 11.6 - 15.2 seconds   INR 1.38  0.00 - 1.49  BASIC METABOLIC PANEL     Status: Abnormal   Collection Time    06/02/13  6:00 AM      Result Value Range   Sodium 133 (*) 135 - 145 mEq/L   Potassium 3.8  3.5 - 5.1 mEq/L   Chloride 99  96 - 112 mEq/L   CO2 23  19 - 32 mEq/L   Glucose, Bld 112 (*) 70 - 99 mg/dL   BUN 46 (*) 6 - 23 mg/dL   Creatinine, Ser 1.61  0.50 - 1.35 mg/dL   Calcium 8.6  8.4 - 09.6 mg/dL   GFR calc non Af Amer 58 (*) >90 mL/min   GFR calc Af Amer 67 (*) >90 mL/min   Comment:            The eGFR has been calculated     using the CKD EPI equation.     This calculation has not been     validated in all clinical     situations.     eGFR's persistently     <90 mL/min signify     possible Chronic Kidney Disease.  CBC     Status: Abnormal   Collection Time    06/02/13  6:00 AM      Result Value Range   WBC 6.7  4.0 - 10.5 K/uL   RBC 3.65 (*) 4.22 - 5.81 MIL/uL   Hemoglobin 10.9 (*) 13.0 - 17.0 g/dL   HCT 04.5 (*) 40.9 - 81.1 %   MCV 87.4  78.0 - 100.0 fL   MCH 29.9  26.0 - 34.0 pg   MCHC 34.2  30.0 - 36.0 g/dL   RDW 91.4 (*) 78.2 - 95.6 %   Platelets 126 (*) 150 - 400 K/uL  HEPARIN LEVEL (UNFRACTIONATED)     Status: Abnormal   Collection Time    06/02/13  6:00 AM      Result Value Range   Heparin Unfractionated 0.26 (*) 0.30 - 0.70 IU/mL   Comment:            IF HEPARIN RESULTS ARE BELOW     EXPECTED VALUES, AND PATIENT     DOSAGE HAS BEEN CONFIRMED,     SUGGEST FOLLOW UP TESTING     OF ANTITHROMBIN III LEVELS.  HEPATIC FUNCTION PANEL     Status: Abnormal   Collection Time    06/02/13  6:00 AM      Result Value Range   Total Protein 6.3  6.0 - 8.3 g/dL   Albumin 3.0 (*) 3.5 - 5.2 g/dL   AST 213 (*) 0 - 37 U/L   ALT 93 (*) 0 - 53 U/L   Alkaline Phosphatase 103  39 - 117 U/L  Total Bilirubin 1.1  0.3 - 1.2 mg/dL   Bilirubin, Direct 0.5 (*) 0.0 - 0.3 mg/dL   Indirect Bilirubin 0.6  0.3 - 0.9 mg/dL   HEPARIN LEVEL (UNFRACTIONATED)     Status: None   Collection Time    06/02/13  4:03 PM      Result Value Range   Heparin Unfractionated 0.39  0.30 - 0.70 IU/mL   Comment:            IF HEPARIN RESULTS ARE BELOW     EXPECTED VALUES, AND PATIENT     DOSAGE HAS BEEN CONFIRMED,     SUGGEST FOLLOW UP TESTING     OF ANTITHROMBIN III LEVELS.  PROTIME-INR     Status: Abnormal   Collection Time    06/03/13  4:35 AM      Result Value Range   Prothrombin Time 16.2 (*) 11.6 - 15.2 seconds   INR 1.33  0.00 - 1.49  BASIC METABOLIC PANEL     Status: Abnormal   Collection Time    06/03/13  4:35 AM      Result Value Range   Sodium 132 (*) 135 - 145 mEq/L   Potassium 3.3 (*) 3.5 - 5.1 mEq/L   Chloride 96  96 - 112 mEq/L   CO2 27  19 - 32 mEq/L   Glucose, Bld 98  70 - 99 mg/dL   BUN 42 (*) 6 - 23 mg/dL   Creatinine, Ser 4.78  0.50 - 1.35 mg/dL   Calcium 8.5  8.4 - 29.5 mg/dL   GFR calc non Af Amer 61 (*) >90 mL/min   GFR calc Af Amer 70 (*) >90 mL/min   Comment:            The eGFR has been calculated     using the CKD EPI equation.     This calculation has not been     validated in all clinical     situations.     eGFR's persistently     <90 mL/min signify     possible Chronic Kidney Disease.  HEPARIN LEVEL (UNFRACTIONATED)     Status: None   Collection Time    06/03/13  4:35 AM      Result Value Range   Heparin Unfractionated 0.36  0.30 - 0.70 IU/mL   Comment:            IF HEPARIN RESULTS ARE BELOW     EXPECTED VALUES, AND PATIENT     DOSAGE HAS BEEN CONFIRMED,     SUGGEST FOLLOW UP TESTING     OF ANTITHROMBIN III LEVELS.    Imaging: Imaging results have been reviewed and US Renal  06/02/2013   *RADIOLOGY REPORT*  Clinical Data:  Urinary retention.  RENAL/URINARY TRACT ULTRASOUND COMPLETE  Comparison:  None.  Findings:  Right Kidney:  Measures 9.5 cm.  Normal in size and parenchymal echogenicity.  No evidence of mass or hydronephrosis.  Left Kidney:  Measures 11.6 cm.  Normal  in size and parenchymal echogenicity.  No evidence of mass or hydronephrosis.  Bladder:  Appears normal for degree of bladder distention.Collapsed around a Foley catheter.  Prostate gland measures 4.8 x 3.1 x 3.0 cm.  Ascites identified.  IMPRESSION:  1.  No acute findings 2.  Ascites.   Original Report Authenticated By: Signa Kell, M.D.    Assessment:  1. Principal Problem: 2.   Urinary tract infection 3. Active Problems: 4.   CAD- CABG X 4 '95, patent  grafts '09 5.   COPD 6.   Congestive heart failure 7.   Long term (current) use of anticoagulants 8.   Left arm swelling 9.   Acute on chronic combined systolic and diastolic CHF (congestive heart failure) 10.   Plan:  1. Back to home dose of diuretics, but may have to titrate this up since LV performance now lower without CRT. Resume ACEi. Still mildly hypervolemic, today, but no rush in correcting that. 2. May be ready for DC from cardiac standpoint as soon as tomorrow. Will need early and frequent INR checks especially with antibiotic therapy on board. Will need to overlap heparin/LMWH for 48 hours with INR>2. 3. Needs a pacer site dressing change.  Time Spent Directly with Patient:  30 minutes  Length of Stay:  LOS: 3 days    Ava Deguire 06/03/2013, 9:32 AM

## 2013-06-04 ENCOUNTER — Ambulatory Visit: Payer: Medicare Other | Admitting: Pharmacist Clinician (PhC)/ Clinical Pharmacy Specialist

## 2013-06-04 ENCOUNTER — Encounter: Payer: Medicare Other | Admitting: Cardiology

## 2013-06-04 DIAGNOSIS — I82629 Acute embolism and thrombosis of deep veins of unspecified upper extremity: Secondary | ICD-10-CM

## 2013-06-04 LAB — CBC
HCT: 31.4 % — ABNORMAL LOW (ref 39.0–52.0)
Hemoglobin: 10.3 g/dL — ABNORMAL LOW (ref 13.0–17.0)
MCH: 29.3 pg (ref 26.0–34.0)
MCHC: 32.8 g/dL (ref 30.0–36.0)

## 2013-06-04 LAB — BASIC METABOLIC PANEL
BUN: 38 mg/dL — ABNORMAL HIGH (ref 6–23)
Chloride: 100 mEq/L (ref 96–112)
Glucose, Bld: 99 mg/dL (ref 70–99)
Potassium: 3.8 mEq/L (ref 3.5–5.1)

## 2013-06-04 MED ORDER — ENOXAPARIN SODIUM 120 MG/0.8ML ~~LOC~~ SOLN: 120.0000 mg | SUBCUTANEOUS | Status: DC

## 2013-06-04 MED ORDER — FUROSEMIDE 40 MG PO TABS
40.0000 mg | ORAL_TABLET | Freq: Every day | ORAL | Status: DC
  Filled 2013-06-04: qty 1

## 2013-06-04 MED ORDER — FUROSEMIDE 40 MG PO TABS: 40.0000 mg | ORAL_TABLET | Freq: Every day | ORAL | Status: DC

## 2013-06-04 MED ORDER — POTASSIUM CHLORIDE CRYS ER 10 MEQ PO TBCR
10.0000 meq | EXTENDED_RELEASE_TABLET | Freq: Two times a day (BID) | ORAL | Status: DC
  Administered 2013-06-04: 10 meq via ORAL
  Filled 2013-06-04 (×2): qty 1

## 2013-06-04 MED ORDER — WARFARIN SODIUM 5 MG PO TABS: ORAL_TABLET | ORAL | Status: DC

## 2013-06-04 MED ORDER — FUROSEMIDE 40 MG PO TABS
40.0000 mg | ORAL_TABLET | Freq: Every day | ORAL | Status: DC
  Administered 2013-06-04: 40 mg via ORAL
  Filled 2013-06-04: qty 1

## 2013-06-04 MED ORDER — FUROSEMIDE 80 MG PO TABS: 80.0000 mg | ORAL_TABLET | Freq: Every day | ORAL | Status: DC

## 2013-06-04 MED ORDER — WARFARIN SODIUM 10 MG PO TABS
10.0000 mg | ORAL_TABLET | Freq: Once | ORAL | Status: AC
  Administered 2013-06-04: 10 mg via ORAL
  Filled 2013-06-04: qty 1

## 2013-06-04 MED ORDER — CIPROFLOXACIN HCL 500 MG PO TABS: 500.0000 mg | ORAL_TABLET | Freq: Two times a day (BID) | ORAL | Status: DC

## 2013-06-04 NOTE — Telephone Encounter (Signed)
Spoke to [name] who stated that she had been in contact with Dr.[name] and believed her questions were already answered. I encouraged her to call back if she needed any further assistance.

## 2013-06-04 NOTE — Discharge Summary (Signed)
Physician Discharge Summary  SCHNEIDER WARCHOL ZOX:[id number] DOB: December 11, 1925 DOA: 05/31/2013  PCP: Margaree Mackintosh, MD  Admit date: 05/31/2013 Discharge date: 06/04/2013  Time spent:40  minutes  Recommendations for Outpatient Follow-up:  1. Home with home health RN 2. Monitor INR in 2 days. Coumadin being bridged with sq lovenox for new LUE DVT and chronic A fib 3. 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 Non occlusive thrombus of left subclavian and axillary vein   Discharge Condition: fair  Diet recommendation:cardiac  Filed Weights   06/01/13 2055 06/02/13 2142 06/03/13 2050  Weight: 80.015 kg (176 lb 6.4 oz) 79.878 kg (176 lb 1.6 oz) 79.878 kg (176 lb 1.6 oz)    History of present illness:  Please refer to admission H&P for details, but in brief, 79 y.o. male who was recently admitted in the hospital for removal of infected AICD and replacement of temporary pacemaker. Marland Kitchen His warfarin was on hold off for the procedure. Since the discharge she has been complaining of burning urination and some blood in the urine. He also had a progressive worsening of shortness of breath . He also noted a significant swelling of his left upper extremity off of the procedure.    Hospital Course:   Acute CHF exacerbation - history of significant CHF with low ejection fraction. 35% per echo from 2011 -patient noted to be in fluid overload and seems to be main concern on presentation .  increased lasix dose to 80 mg am and 40 mg at bedtime. Will arrange home  health upon discharge. Follow up with cardiologist as outpt Daily I.'s and O. daily weight  Continue digoxin .  Appreciate cardiology recs  UTI  cx growing pseudomonas. pan sensitive to cipro. Will be discharged on total 7 days of antibiotics.   left upper arm swelling, right leg swelling  Swelling since removed infected AICD. doppler LUE showed acute occlusive  DVT in left IJ and non occlusive thrombus in left [place] and axillary vein.  No signs of infection.  - not taking his warfarin since recent AICD extraction.  -CT angio chest was negative for PE.  -Resumed coumadin. Bridging with IV heparin initially and then switched to lovenox once  Renal fn improved . Will discharge on sq lovenox 120 mg daily to bridge for coumadin until INR therapeutic for 2 days.   left arm swelling much improved.    AKI  possibly from UTI and urinary retention Now resolved. Captopril resumed   Urinary retention  Possibly from UTI . Denies any other symptoms of BPH.  Required foley due to significant bladder retention. USG unremarkable. Voiding trial successful this morning and patient urinating well.   Hypothyroidism  Continue Synthroid   Recent extraction of AICD  He currently has a temporary pacemaker . Being followed by cardiology .scheduled for reimplantation early August with Dr. Ladona Ridgel.   Hyperkalemia / hypokalemia  Currently stable    Code Status: Full     Family Communication: daughter at bedside  Disposition Plan: home with home health  Consultants:  [facility] ( Dr Royann Shivers)   Procedures:  None   Antibiotics:  IV cefepime ( 7/20-7/21) IV cipro (7/21) until 7/28        Discharge Exam: Filed Vitals:   06/03/13 2050 06/03/13 2156 06/04/13 0535 06/04/13 0815  BP: 104/58  97/63 98/59  Pulse: 79 80 80 84  Temp: 98.7 F (37.1 C)  98.8 F (37.1 C) 98.7 F (37.1 C)  TempSrc: Oral  Oral Oral  Resp: 18 20 20 20   Height: 5\' 11"  (1.803 m)     Weight: 79.878 kg (176 lb 1.6 oz)      SpO2: 99% 98% 99% 99%    General: Elderly male in NAD  HEENT: no pallor, moist oral mucosa  Chest: clear breath sounds b/l, no added sounds  Cardiovascular: NS1&S2, no murmurs. Pacemaker site clean  Abdomen: soft, NT, ND, BS+ , foley in place  Musculoskeletal: ext; swelling over left UE improved  . Trace LE edema  CNS: aaox3   Discharge Instructions   Future Appointments Provider Department Dept Phone   06/14/2013 1:45 PM Marinus Maw, MD [facility] [place]) [phone]       Medication List         albuterol 108 (90 BASE) MCG/ACT inhaler  Commonly known as:  PROVENTIL HFA;VENTOLIN HFA  Inhale 2 puffs into the lungs every 6 (six) hours as needed for wheezing or shortness of breath.     bimatoprost 0.03 % ophthalmic solution  Commonly known as:  LUMIGAN  Place 1 drop into both eyes at bedtime.     budesonide-formoterol 160-4.5 MCG/ACT inhaler  Commonly known as:  SYMBICORT  Inhale 1 puff into the lungs 2 (two) times daily.     captopril 25 MG tablet  Commonly known as:  CAPOTEN  Take 0.5 tablets (12.5 mg total) by mouth 2 (two) times daily.     ciprofloxacin 500 MG tablet  Commonly known as:  CIPRO  Take 1 tablet (500 mg total) by mouth 2 (two) times daily.(  Until 7/28)     COMBIGAN 0.2-0.5 % ophthalmic solution  Generic drug:  brimonidine-timolol  Place 1 drop into both eyes Twice daily.     CoQ10 100 MG Caps  Take 100 mg by mouth daily.     digoxin 0.125 MG tablet  Commonly known as:  LANOXIN  Take 1 tablet (125 mcg total) by mouth daily.     dorzolamide 2 % ophthalmic solution  Commonly known as:  TRUSOPT  Place 1 drop into the left eye Twice daily.     enoxaparin 120 MG/0.8ML injection  Commonly known as:  LOVENOX  Inject 0.8 mLs (120 mg total) into the skin daily.( continue until INR therapeutic for 2 days)     fish oil-omega-3 fatty acids 1000 MG capsule  Take 2 g by mouth daily.     folic acid-pyridoxine-cyancobalamin  2.5-25-2 MG Tabs  Commonly known as:  FOLTX  Take 1 tablet by mouth daily.     furosemide 80 MG tablet  Commonly known as:  LASIX  Take 80 mg by mouth daily.     furosemide 40 MG tablet  Commonly known as:  LASIX  Take 1 tablet (40 mg total) by mouth at bedtime.     KLOR-CON 10 10 MEQ tablet  Generic drug:  potassium chloride  Take 10 mEq by mouth daily.     levothyroxine 125 MCG tablet  Commonly known as:  SYNTHROID,  LEVOTHROID  Take 125 mcg by mouth daily before breakfast.     magnesium oxide 400 MG tablet  Commonly known as:  MAG-OX  Take 400 mg by mouth daily.     vitamin E 400 UNIT capsule  Take 400 Units by mouth daily.     warfarin 5 MG tablet  Commonly known as:  COUMADIN  Please take 5 mg daily until outpatient INR monitored in 2 days       No Known Allergies     Follow-up Information   Follow up with Margaree Mackintosh, MD In 1 week. (needs INR checked in 2 days while on lovenox bridge )    Contact information:   [address] [place] [address] [phone]       Follow up with Lewayne Bunting, MD In 2 weeks.   Contact information:   [address] [place] [address] [phone]        The results of significant diagnostics from this hospitalization (including imaging, microbiology, ancillary and laboratory) are listed below for reference.    Significant Diagnostic Studies: Dg Chest 2 View  05/28/2013   *RADIOLOGY REPORT*  Clinical Data: Pacemaker lead extraction  CHEST - 2 VIEW  Comparison: Chest x-ray of 05/21/2013  Findings: There is been a change in the pacemaker and all of prior leads appear to have been removed with a single lead now remaining. The lungs are well aerated.  However there is cardiomegaly present with small effusions and mild pulmonary vascular congestion. Median sternotomy sutures are noted.  A compressed mid thoracic vertebral body is unchanged. Old left lateral rib fractures are noted.  IMPRESSION:  1.   Change of pacemaker with single lead pacer now present. 2.  Cardiomegaly and mild pulmonary vascular congestion with small effusions.   Original Report Authenticated By: Dwyane Dee, M.D.   Dg Chest 2 View  05/21/2013   *RADIOLOGY REPORT*  Clinical Data: Pacemaker placement  CHEST - 2 VIEW  Comparison: 10/22/2012  Findings: Cardiomegaly again noted.  Four leads cardiac pacemaker is unchanged in position.  No acute infiltrate or pleural effusion. No pulmonary edema.  Bony thorax is stable.  IMPRESSION: No active disease.  Stable four leads cardiac pacemaker position.   Original Report Authenticated By: Natasha Mead, M.D.   Ct Angio Chest Pe W/cm &/or Wo Cm  05/31/2013   *RADIOLOGY REPORT*  Clinical Data: Recent AICD lead removal.  History of a cardiomyopathy.  CT ANGIOGRAPHY CHEST  Technique:  Multidetector CT imaging of the chest using the standard protocol during bolus administration of intravenous contrast. Multiplanar reconstructed images including MIPs were obtained and reviewed to evaluate the vascular anatomy.  Contrast: 80mL OMNIPAQUE IOHEXOL 350 MG/ML SOLN  Comparison: Chest radiograph, 05/28/2013  Findings: No evidence of a pulmonary embolus.  There is moderate enlargement of the heart.  There are dense coronary artery calcifications and changes from previous CABG surgery.  No mediastinal or hilar masses are appreciated and there are no pathologically enlarged lymph nodes.  There is lung base subsegmental atelectasis.  There are mild areas of peripheral interstitial thickening which appear chronic.  No infiltrate or pulmonary edema.  There is no pleural effusion or pneumothorax.  Limited evaluation of the upper abdomen is unremarkable.  There is a burst type fracture of T7, which appears old.  This was present on the prior chest radiograph.  IMPRESSION: No evidence of a pulmonary embolus.  Moderate cardiomegaly.  Mild lung base subsegmental atelectasis. No edema or infiltrate.  Original Report  Authenticated By: Amie Portland, M.D.   US Renal  06/02/2013   *RADIOLOGY REPORT*  Clinical Data:  Urinary retention.  RENAL/URINARY TRACT ULTRASOUND COMPLETE  Comparison:  None.  Findings:  Right Kidney:  Measures 9.5 cm.  Normal in size and parenchymal echogenicity.  No evidence of mass or hydronephrosis.  Left Kidney:  Measures 11.6 cm.  Normal in size and parenchymal echogenicity.  No evidence of mass or hydronephrosis.  Bladder:  Appears normal for degree of bladder distention.Collapsed around a Foley catheter.  Prostate gland measures 4.8 x 3.1 x 3.0 cm.  Ascites identified.  IMPRESSION:  1.  No acute findings 2.  Ascites.   Original Report Authenticated By: Signa Kell, M.D.    Microbiology: Recent Results (from the past 240 hour(s))  MRSA PCR SCREENING     Status: None   Collection Time    05/27/13  8:42 PM      Result Value Range Status   MRSA by PCR NEGATIVE  NEGATIVE Final   Comment:            The GeneXpert MRSA Assay (FDA     approved for NASAL specimens     only), is one component of a     comprehensive MRSA colonization     surveillance program. It is not     intended to diagnose MRSA     infection nor to guide or     monitor treatment for     MRSA infections.  URINE CULTURE     Status: None   Collection Time    05/31/13  7:26 PM      Result Value Range Status   Specimen Description URINE, CLEAN CATCH   Final   Special Requests ADDED 0045 06/01/13   Final   Culture  Setup Time 06/01/2013 00:40   Final   Colony Count >=100,000 COLONIES/ML   Final   Culture PSEUDOMONAS AERUGINOSA   Final   Report Status 06/03/2013 FINAL   Final   Organism ID, Bacteria PSEUDOMONAS AERUGINOSA   Final     Labs: Basic Metabolic Panel:  Recent Labs Lab 05/29/13 0120 05/31/13 2001 06/01/13 0400 06/02/13 0600 06/03/13 0435 06/04/13 0500  NA 134* 133* 132* 133* 132* 134*  K 4.0 5.2* 5.2* 3.8 3.3* 3.8  CL 99 100 96 99 96 100  CO2 26  --  23 23 27 27   GLUCOSE 139* 154* 132* 112*  98 99  BUN 31* 47* 52* 46* 42* 38*  CREATININE 1.30 1.70* 1.47* 1.12 1.08 1.03  CALCIUM 8.5  --  9.3 8.6 8.5 8.6   Liver Function Tests:  Recent Labs Lab 06/01/13 0400 06/02/13 0600  AST 220* 106*  ALT 120* 93*  ALKPHOS 107 103  BILITOT 1.4* 1.1  PROT 7.0 6.3  ALBUMIN 3.4* 3.0*   No results found for this basename: LIPASE, AMYLASE,  in the last 168 hours No results found for this basename: AMMONIA,  in the last 168 hours CBC:  Recent Labs Lab 05/29/13 0120 05/31/13 1951 05/31/13 2001 06/01/13 0400 06/02/13 0600 06/04/13 0500  WBC 5.8 11.7*  --  11.7* 6.7 4.7  NEUTROABS  --  10.3*  --   --   --   --   HGB 10.7* 11.4* 11.9* 11.2* 10.9* 10.3*  HCT 31.4* 34.0* 35.0* 33.7* 31.9* 31.4*  MCV 87.5 87.6  --  88.7 87.4 89.2  PLT 89* 119*  --  124* 126* 132*   Cardiac Enzymes: No results found  for this basename: CKTOTAL, CKMB, CKMBINDEX, TROPONINI,  in the last 168 hours BNP: BNP (last 3 results)  Recent Labs  05/31/13 1951 06/01/13 0430  PROBNP 8821.0* 9189.0*   CBG: No results found for this basename: GLUCAP,  in the last 168 hours     Signed:  Jadynn Epping  [facility] 06/04/2013, 1:01 PM

## 2013-06-04 NOTE — Care Management Note (Addendum)
   CARE MANAGEMENT NOTE 06/04/2013  Patient:  [name], [name]   Account Number:  [id number]  Date Initiated:  06/04/2013  Documentation initiated by:  Elloise Roark  Subjective/Objective Assessment:   HH referral for CHF program.     Action/Plan:   Met with pt and daughter re HH needs. Family concerned re Lovenox injections, foley care and temporary pacer. Given list and pt and daughter selected [facility] for [facility] needs.   Anticipated DC Date:  06/04/2013   Anticipated DC Plan:  HOME W HOME HEALTH SERVICES         Choice offered to / List presented to:  C-4 Adult Children   DME arranged  Levan Hurst      DME agency  [facility]     [facility] arranged  HH-1 RN      [facility] agency  [facility]   Status of service:  Completed, signed off Medicare Important Message given?   (If response is "NO", the following Medicare IM given date fields will be blank) Date Medicare IM given:   Date Additional Medicare IM given:    Discharge Disposition:  HOME W HOME HEALTH SERVICES  Per UR Regulation:    If discussed at Long Length of Stay Meetings, dates discussed:    Comments:   Pt to receive CHF program in the home, also will need follow up on foley, Lovenox injection and temporary pacer site.

## 2013-06-04 NOTE — Progress Notes (Signed)
Pt. Got d/c orders and instructions.Spend time with pt, and family practicing injections.prescriptions were given to pt's daughter.IV was removed.telemetry was d/c.pt. Ready to go home

## 2013-06-04 NOTE — Progress Notes (Signed)
ANTICOAGULATION CONSULT NOTE - Follow Up Consult  Pharmacy Consult for warfarin and Lovenox Indication: atrial fibrillation and DVT  No Known Allergies  Patient Measurements: Height: 5\' 11"  (180.3 cm) Weight: 176 lb 1.6 oz (79.878 kg) IBW/kg (Calculated) : 75.3   Vital Signs: Temp: 98.7 F (37.1 C) (07/22 0815) Temp src: Oral (07/22 0815) BP: 98/59 mmHg (07/22 0815) Pulse Rate: 84 (07/22 0815)  Labs:  Recent Labs  06/02/13 0600 06/02/13 1603 06/03/13 0435 06/04/13 0500  HGB 10.9*  --   --  10.3*  HCT 31.9*  --   --  31.4*  PLT 126*  --   --  132*  LABPROT 16.6*  --  16.2* 16.4*  INR 1.38  --  1.33 1.36  HEPARINUNFRC 0.26* 0.39 0.36  --   CREATININE 1.12  --  1.08 1.03    Estimated Creatinine Clearance: 55.8 ml/min (by C-G formula based on Cr of 1.03).   Medications:  Scheduled:  . bimatoprost  1 drop Both Eyes QHS  . brimonidine  1 drop Both Eyes BID  . budesonide-formoterol  1 puff Inhalation BID  . ciprofloxacin  500 mg Oral BID  . dorzolamide  1 drop Left Eye BID  . enoxaparin (LOVENOX) injection  120 mg Subcutaneous Q24H  . folic acid-pyridoxine-cyancobalamin  1 tablet Oral Daily  . furosemide  40 mg Oral Q1400  . levothyroxine  125 mcg Oral QAC breakfast  . magnesium oxide  400 mg Oral Daily  . omega-3 acid ethyl esters  1 g Oral Daily  . sodium chloride  3 mL Intravenous Q12H  . tamsulosin  0.4 mg Oral QPC supper  . timolol  1 drop Both Eyes BID  . Warfarin - Pharmacist Dosing Inpatient   Does not apply q1800    Assessment: 87 YOM on chronic warfarin for AFib who also has a new DVT. Was transitioned from IV heparin to full dose Lovenox yesterday in anticipation of discharge. INR this morning is still subtherapeutic at 1.36- has not moved despite receiving 3 doses of 5mg . (home dose is 5mg  TTSun and 2.5 all other days). Started on ciprofloxacin yesterday which can potentiate INR. Do not think a dose increase in warfarin will make patient  supratherapeutic. Will require to continue Lovenox until INR is therapeutic x2 readings. Platelets continue to trend up. No bleeding noted.  Goal of Therapy:  INR 2-3 Monitor platelets by anticoagulation protocol: Yes   Plan:  1. Warfarin 10mg  po x1 tonight 2. Continue Lovenox 120mg  subq Q24 3. Daily PT/INR 4. Q72 CBC 5. Watch for signs and symptoms of bleeding  Chelsey Kimberley D. Linken Mcglothen, PharmD Clinical Pharmacist Pager: [phone] 06/04/2013 11:23 AM

## 2013-06-04 NOTE — Progress Notes (Signed)
Subjective:  No SOB  Objective:  Vital Signs in the last 24 hours: Temp:  [96.6 F (35.9 C)-98.8 F (37.1 C)] 98.8 F (37.1 C) (07/22 0535) Pulse Rate:  [79-86] 80 (07/22 0535) Resp:  [18-20] 20 (07/22 0535) BP: (97-124)/(58-74) 97/63 mmHg (07/22 0535) SpO2:  [96 %-100 %] 99 % (07/22 0535) Weight:  [79.878 kg (176 lb 1.6 oz)] 79.878 kg (176 lb 1.6 oz) (07/21 2050)  Intake/Output from previous day:  Intake/Output Summary (Last 24 hours) at 06/04/13 0800 Last data filed at 06/04/13 0536  Gross per 24 hour  Intake    960 ml  Output    500 ml  Net    460 ml    Physical Exam: General appearance: alert, cooperative and no distress Lungs: few crackles Lt baserr Heart: regular rate and rhythm   Rate: 70  Rhythm: paced  Lab Results:  Recent Labs  06/02/13 0600 06/04/13 0500  WBC 6.7 4.7  HGB 10.9* 10.3*  PLT 126* 132*    Recent Labs  06/03/13 0435 06/04/13 0500  NA 132* 134*  K 3.3* 3.8  CL 96 100  CO2 27 27  GLUCOSE 98 99  BUN 42* 38*  CREATININE 1.08 1.03   No results found for this basename: TROPONINI, CK, MB,  in the last 72 hours Hepatic Function Panel  Recent Labs  06/02/13 0600  PROT 6.3  ALBUMIN 3.0*  AST 106*  ALT 93*  ALKPHOS 103  BILITOT 1.1  BILIDIR 0.5*  IBILI 0.6   No results found for this basename: CHOL,  in the last 72 hours  Recent Labs  06/04/13 0500  INR 1.36    Imaging: Imaging results have been reviewed  Cardiac Studies:  Assessment/Plan:   Principal Problem:   Acute on chronic systolic heart failure- re admitted 06/01/13 Active Problems:   CAD- CABG X 4 '95, patent grafts '09   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 Voiding trial before discharge. I have contacted Dr Lubertha Basque office, (he was scheduled for f/u and dressing change today). He is on full dose Lovenox and Coumadin. He is on Cipro for UTI. CHF seems compensated.   Corine Shelter PA-C Beeper [phone] 06/04/2013, 8:00 AM   I have seen and examined the patient along with Corine Shelter PA-C.  I have reviewed the chart, notes and new data.  I agree with PA's note.  Doubt weights are accurate. Note slightly positive fluid balance last 24 hours. Compensated from symptom standpoint.  PLAN: Increase furosemide to 80 mg QAM + 40 mg QPM until CRT device reinserted. Follow up scheduled for reimplantation early August with Dr. Ladona Ridgel. INR is still subtherapeutic. If he is ready for discharge would bridge with enoxaparin.  Thurmon Fair, MD, [facility] [phone] 06/04/2013, 11:03 AM

## 2013-06-05 NOTE — Progress Notes (Signed)
  This encounter was created in error - please disregard. Pt's appt was canceled. He is currently in hospital.

## 2013-06-06 ENCOUNTER — Ambulatory Visit (INDEPENDENT_AMBULATORY_CARE_PROVIDER_SITE_OTHER): Payer: Medicare Other | Admitting: Cardiology

## 2013-06-06 ENCOUNTER — Ambulatory Visit (INDEPENDENT_AMBULATORY_CARE_PROVIDER_SITE_OTHER): Payer: Medicare Other | Admitting: Pharmacist Clinician (PhC)/ Clinical Pharmacy Specialist

## 2013-06-06 ENCOUNTER — Encounter: Payer: Self-pay | Admitting: Cardiology

## 2013-06-06 ENCOUNTER — Telehealth: Payer: Self-pay | Admitting: Cardiovascular Disease

## 2013-06-06 ENCOUNTER — Telehealth: Payer: Self-pay | Admitting: Internal Medicine

## 2013-06-06 ENCOUNTER — Ambulatory Visit: Payer: Medicare Other | Admitting: Pharmacist Clinician (PhC)/ Clinical Pharmacy Specialist

## 2013-06-06 VITALS — BP 120/70 | HR 68 | Temp 94.6°F | Ht 71.0 in | Wt 181.6 lb

## 2013-06-06 DIAGNOSIS — I82629 Acute embolism and thrombosis of deep veins of unspecified upper extremity: Secondary | ICD-10-CM

## 2013-06-06 DIAGNOSIS — I480 Paroxysmal atrial fibrillation: Secondary | ICD-10-CM

## 2013-06-06 DIAGNOSIS — I82622 Acute embolism and thrombosis of deep veins of left upper extremity: Secondary | ICD-10-CM

## 2013-06-06 DIAGNOSIS — R5381 Other malaise: Secondary | ICD-10-CM

## 2013-06-06 DIAGNOSIS — I5023 Acute on chronic systolic (congestive) heart failure: Secondary | ICD-10-CM

## 2013-06-06 DIAGNOSIS — Z5189 Encounter for other specified aftercare: Secondary | ICD-10-CM

## 2013-06-06 DIAGNOSIS — Z7901 Long term (current) use of anticoagulants: Secondary | ICD-10-CM

## 2013-06-06 DIAGNOSIS — T829XXD Unspecified complication of cardiac and vascular prosthetic device, implant and graft, subsequent encounter: Secondary | ICD-10-CM

## 2013-06-06 DIAGNOSIS — I4891 Unspecified atrial fibrillation: Secondary | ICD-10-CM

## 2013-06-06 MED ORDER — METOLAZONE 5 MG PO TABS: ORAL_TABLET | ORAL | Status: DC

## 2013-06-06 NOTE — Patient Instructions (Signed)
Take 5 mg of Metolazone 30 minutes before Furosemide. Start with this evenings dose then once a day Friday, Saturday and Sunday am. Home Health visit Tuesday. Dan Humphreys

## 2013-06-06 NOTE — Telephone Encounter (Signed)
Please call need to give some orders and information about his status.Marland KitchenMarland KitchenPlease call

## 2013-06-06 NOTE — Progress Notes (Signed)
06/06/2013 Rueben Bash [facility]   12-05-1925  [id number]  Primary Physician Margaree Mackintosh, MD Primary Cardiologist: Dr Royann Shivers  HPI:  Mr Saber is an 79y/o pt followed by our group with a history of CAD and ICM. He had CABG X 4 in '95. He had a pacemaker placed in 1999 after he had a syncopal spell while [place] a [place]. He had EOL Gen change in 2006. Cath in 2009 revealed patent grafts but severe LVD. He had a BiV ICD placed in 2009 by Dr Ladona Ridgel. Dr Royann Shivers notes he has PAF and is pacer dependent. He had a device change 12/13 by Dr Royann Shivers. We saw him in the office 05/21/13 when he presented with erosion of the skin around his device. He was admitted and ultimately had extraction of his device and leads by Dr Ladona Ridgel 05/27/13 with implantation of a temporary permanent transvenous system. He was discharged on 05/29/13 and re admitted 05/31/13 with acute CHF. His discharge wgt was 176. He was also found to have Lt arm DVT with occlusion of his Lt IJ and partial occlusion of the Lt Ramos vein and axillary vein. He was placed on Coumadin. He also had urinary retention and a UTI. He was discharged 06/04/13.            Today he is seen in the office for a post hospital follow up. His daughter accompanied him. He has had increasing SOB, abdominal swell, and lower extremity edema since discharge. His wgt is up to 181 (5lbs).    Current Outpatient Prescriptions  Medication Sig Dispense Refill  . albuterol (PROVENTIL HFA;VENTOLIN HFA) 108 (90 BASE) MCG/ACT inhaler Inhale 2 puffs into the lungs every 6 (six) hours as needed for wheezing or shortness of breath.      . bimatoprost (LUMIGAN) 0.03 % ophthalmic solution Place 1 drop into both eyes at bedtime.       . budesonide-formoterol (SYMBICORT) 160-4.5 MCG/ACT inhaler Inhale 1 puff into the lungs 2 (two) times daily.      . captopril (CAPOTEN) 25 MG tablet Take 0.5 tablets (12.5 mg total) by mouth 2 (two) times daily.  90 tablet  2  . ciprofloxacin (CIPRO) 500 MG tablet  Take 1 tablet (500 mg total) by mouth 2 (two) times daily.  6 tablet  0  . Coenzyme Q10 (COQ10) 100 MG CAPS Take 100 mg by mouth daily.       . COMBIGAN 0.2-0.5 % ophthalmic solution Place 1 drop into both eyes Twice daily.      . digoxin (LANOXIN) 0.125 MG tablet Take 1 tablet (125 mcg total) by mouth daily.  90 tablet  2  . dorzolamide (TRUSOPT) 2 % ophthalmic solution Place 1 drop into the left eye Twice daily.      Marland Kitchen enoxaparin (LOVENOX) 120 MG/0.8ML injection Inject 0.8 mLs (120 mg total) into the skin daily.  10 mL  0  . fish oil-omega-3 fatty acids 1000 MG capsule Take 2 g by mouth daily.      . folic acid-pyridoxine-cyancobalamin (FOLTX) 2.5-25-2 MG TABS Take 1 tablet by mouth daily.      . furosemide (LASIX) 40 MG tablet Take 1 tablet (40 mg total) by mouth at bedtime.  30 tablet  0  . furosemide (LASIX) 80 MG tablet Take 80 mg by mouth daily.        Marland Kitchen KLOR-CON 10 10 MEQ CR tablet Take 10 mEq by mouth daily.       Marland Kitchen levothyroxine (SYNTHROID, LEVOTHROID) 125  MCG tablet Take 125 mcg by mouth daily before breakfast.      . magnesium oxide (MAG-OX) 400 MG tablet Take 400 mg by mouth daily.      . metolazone (ZAROXOLYN) 5 MG tablet Take one a day or as directed 30 minutes before taking Furosemide.  15 tablet  2  . vitamin E 400 UNIT capsule Take 400 Units by mouth daily.        Marland Kitchen warfarin (COUMADIN) 5 MG tablet Please take 5 mg daily until outpatient INR monitored in 2 days       No current facility-administered medications for this visit.    No Known Allergies  History   Social History  . Marital Status: Widowed    Spouse Name: N/A    Number of Children: N/A  . 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 Smoking status: Never Smoker   . Smokeless tobacco: Never Used  . Alcohol Use: 8.4 oz/week    14 Glasses of wine per week     Comment: 05/21/2013 "couple glasses of wine qd"  . Drug Use: No  .  Sexually Active: No   Other Topics Concern  . Not on file   Social History Narrative  . No narrative on file     Review of Systems: General: negative for chills, fever, night sweats or weight changes.  Cardiovascular: negative for chest pain, dyspnea on exertion, edema, orthopnea, palpitations, paroxysmal nocturnal dyspnea or shortness of breath Dermatological: negative for rash Respiratory: negative for cough or wheezing Urologic: negative for hematuria Abdominal: negative for nausea, vomiting, diarrhea, bright red blood per rectum, melena, or hematemesis Neurologic: negative for visual changes, syncope, or dizziness All other systems reviewed and are otherwise negative except as noted above.    Blood pressure 120/70, pulse 68, temperature 94.6 F (34.8 C), height 5\' 11"  (1.803 m), weight 181 lb 9.6 oz (82.373 kg).  General appearance: alert, cooperative and no distress Lungs: few basilar crackles Heart: regular rate and rhythm, external pacemaker Lt chest Extremities: 1+ blat pre tibial pitting edema  EKG  EKG: normal EKG, normal sinus rhythm, unchanged from previous tracings.  ASSESSMENT AND PLAN:   Acute on chronic systolic heart failure- re admitted 06/01/13 Acute CHF after BiV removed. Today his wgt is up 5 lbs from discharge  Pacemaker complications - sterile erosion- explant 05/27/13 Dr Ladona Ridgel  Arm DVT, acute IJ and SCV DVT after T-TVDP placed Now on Coumadin  Debilitated patient Family requesting a walker   PLAN  I added Zaroxolyn 5 mg daily X 4 days. He will be evaluated by the [facility] on Tuesday next week. I ordered a rolling walker at the family's request. He is to see Dr Ladona Ridgel 8/1 and is tentatively scheduled to have a new device implanted 8/4.   [facility] KPA-C 06/06/2013 4:45 PM

## 2013-06-06 NOTE — Assessment & Plan Note (Signed)
Dr Taylor

## 2013-06-06 NOTE — Assessment & Plan Note (Signed)
Family requesting a walker

## 2013-06-06 NOTE — Telephone Encounter (Signed)
Per [facility] appt scheduled this afternoon w/PA, Willaim Sheng will put on schedule

## 2013-06-06 NOTE — Assessment & Plan Note (Signed)
Acute CHF after BiV removed. Today his wgt is up 5 lbs from discharge

## 2013-06-06 NOTE — Assessment & Plan Note (Signed)
Now on Coumadin

## 2013-06-07 ENCOUNTER — Other Ambulatory Visit: Payer: Medicare Other | Admitting: Internal Medicine

## 2013-06-07 ENCOUNTER — Encounter (HOSPITAL_COMMUNITY): Payer: Self-pay | Admitting: Pharmacy Technician

## 2013-06-07 ENCOUNTER — Telehealth: Payer: Self-pay | Admitting: Cardiovascular Disease

## 2013-06-07 NOTE — Telephone Encounter (Signed)
Spoke to Ann,she wanted to know if her father need lovenox still and does he need something for sleep.Per Dr Royann Shivers , Mr Gabriel can stop the lovenox because INR  was 3 yesterday. No sleeping aid  Try to sleep in a recliner or with head elevated. Ann voiced understanding.  [facility] is aware that PT/INR was done 7/24,needs a home visit on 7/29. Prescription for rolling walker was given to [name] to furnish.  Informed Ann if she has any issue she can call the office /after hours as well.

## 2013-06-07 NOTE — Telephone Encounter (Signed)
Patient daughter called for rescue inhaler refill last evening, but called this a.m. To add the folic acid refill too.

## 2013-06-07 NOTE — Telephone Encounter (Signed)
[name] has questions about her father's  PT/INR wants to know do he still needs to do the lovenox injections based on his inr. Also there was supposed to be a new prescription written for her father so that he can sleep. He is still having shortness of breath and is very uncomfortable sleeping .Marland Kitchen Please give her a call.    Thanks

## 2013-06-07 NOTE — Telephone Encounter (Signed)
[name] was here yesterday and had PT----[facility] has an order to draw today---does the patient still need to have done today?

## 2013-06-07 NOTE — Telephone Encounter (Signed)
Please call back at this number..[phone]   Thanks

## 2013-06-08 ENCOUNTER — Encounter: Payer: Self-pay | Admitting: Cardiovascular Disease

## 2013-06-08 NOTE — Assessment & Plan Note (Signed)
Controlled with light dose Symbicort

## 2013-06-08 NOTE — Assessment & Plan Note (Signed)
Low risk, but watch chest x-ray over time.

## 2013-06-10 ENCOUNTER — Other Ambulatory Visit: Payer: Self-pay

## 2013-06-10 ENCOUNTER — Encounter: Payer: Medicare Other | Admitting: Internal Medicine

## 2013-06-10 ENCOUNTER — Telehealth: Payer: Self-pay | Admitting: Cardiovascular Disease

## 2013-06-10 MED ORDER — FA-PYRIDOXINE-CYANOCOBALAMIN 2.5-25-2 MG PO TABS: 1.0000 | ORAL_TABLET | Freq: Every day | ORAL | Status: DC

## 2013-06-10 NOTE — Telephone Encounter (Signed)
Returned call and spoke w/ Dewayne Hatch, pt's daughter.  Stated the metolazone is working.  Wanted to know how much is too much as pt has lost a lot of weight in 3 days.  7.25.14 - 175.8 lbs 7.28.14 - 160 lbs  Stated pt currently taking Lasix: 80 mg in AM and 40 mg in PM w/ metolazone 30 mins before Lasix.  Stated pt c/o achy, jumpy legs and not being able to sleep.  Stated Dr. Salena Saner did not want to give pt anything for sleep and asked if it was possible for pt to try melatonin.  Asked daughter if pt's K+ was increased when the Lasix was increased and she stated she doesn't think so.  Also asked if pt could have a BMP drawn.  Stated HH is coming tomorrow, but she can make arrangements for pt to come today if needed.  Informed Dr. Royann Shivers will be notified for further instructions.  Verbalized understanding and agreed w/ plan.  Message forwarded to Dr. Royann Shivers.

## 2013-06-10 NOTE — Telephone Encounter (Signed)
Returned call and informed Ann per instructions by MD/PA.  Asked if BMET could be done.  Dr. Royann Shivers notified and agreed.  Ann informed.  Ann also asked if pt can try melatonin for sleep problems and informed Dr. Royann Shivers did not address that and advised pt see PCP r/t problems w/ sleep.  Verbalized understanding and agreed w/ plan.  Call to [facility] and spoke w/ [place].  Verbal order given to draw BMET tomorrow at visit.  Agreed to place order.

## 2013-06-10 NOTE — Telephone Encounter (Signed)
Please call-question about  Medicine and also the amount of fluid he has.

## 2013-06-10 NOTE — Telephone Encounter (Signed)
Stop metolazone. Take only on days when weight is over 165 lb.

## 2013-06-11 ENCOUNTER — Ambulatory Visit (INDEPENDENT_AMBULATORY_CARE_PROVIDER_SITE_OTHER): Payer: Medicare Other | Admitting: Pharmacist

## 2013-06-11 ENCOUNTER — Telehealth: Payer: Self-pay | Admitting: Cardiovascular Disease

## 2013-06-11 DIAGNOSIS — I480 Paroxysmal atrial fibrillation: Secondary | ICD-10-CM

## 2013-06-11 DIAGNOSIS — Z7901 Long term (current) use of anticoagulants: Secondary | ICD-10-CM

## 2013-06-11 DIAGNOSIS — I4891 Unspecified atrial fibrillation: Secondary | ICD-10-CM

## 2013-06-11 NOTE — Telephone Encounter (Signed)
**  Correction**  Nurse at pt's home is [place].  Call to [facility] [place] and informed per Dr. Royann Shivers, pt to hold furosemide and metolazone x 24hrs and have BP rechecked tomorrow.  Daughter to monitor at home.  Faxed orders to [place].  Call to pt's daughter, Karen Kitchens and informed per instructions by Dr. Royann Shivers.  Daughter asked if pt to hold captopril too.  Dr. Royann Shivers notified and advised pt to hold captopril until systolic BP >95 consistently.  Daughter informed and verbalized understanding.  Also updated fax to [place] and refaxed orders.  Daughter will check BP twice today and understands orders given for Nurse to come back tomorrow to recheck BP.  Informed they will be contacted when BMP results received and reviewed by Dr. Royann Shivers.  Verbalized understanding and agreed w/ plan.  Kennon Rounds, Pharmacist to call daughter r/t INR and coumadin.

## 2013-06-11 NOTE — Telephone Encounter (Signed)
Spoke w/ Arline Asp w/ [facility].  Stated pt's daughter called last night r/t low BP and talked w/ Dr. Lucien Mons.  Stated pt was advised to hold Lasix and BP med.  Stated she also checked pt's INR (2.0) today and BMP drawn.  Informed Cindy, Dr. Royann Shivers will be notified for further instructions and RN will call her and pt back.  Verbalized understanding.  Fax also received from [place] with information r/t call.  Dr. Royann Shivers notified for further instructions.

## 2013-06-11 NOTE — Telephone Encounter (Signed)
Call from [place] w/ [facility].  Stated she just faxed BMP results and electrolytes are off balance.  Informed Dr. Royann Shivers will be notified and RN will call her back.  Verbalized understanding.  BMP received and reviewed by Dr. Royann Shivers.  Advised pt increase K+ by 40 mEq today x 1 (50 mEq).    Call to [place] at [place] and informed.  Verbalized understanding and confirmed Lyla Son will be going to check BP tomorrow.  Call to pt/[facility] at home number and line busy x 3.  Call to Dewayne Hatch, pt's daughter and updated on all info.  Ann asked for [facility] results and given.  Instructions given per Dr. Royann Shivers and Dewayne Hatch verbalized understanding.  Asked pt BMP will be repeated tomorrow and informed it has not been ordered at this time.  Informed Dr. Royann Shivers will advise further tomorrow after BP check w/ nurse given.  Verbalized understanding.

## 2013-06-11 NOTE — Telephone Encounter (Signed)
Spoke with [place], pt's daughter.  INR addressed.  See anti-coag note for details.

## 2013-06-11 NOTE — Telephone Encounter (Signed)
She wanted you to know [name] had an episode last night-she is faxing over a detailed account of whatt happened.

## 2013-06-12 NOTE — Telephone Encounter (Signed)
Please restart captopril today

## 2013-06-12 NOTE — Telephone Encounter (Signed)
Please call-you talked to her other sister earlier.

## 2013-06-12 NOTE — Telephone Encounter (Signed)
Lyla Son, nurse w/ [facility], called in BP 100/58 HR 80 and per daughter earlier BP 110/57 HR 80.  Stated pt's weight is 159.2 lbs today.  Stated daughter wants to know when to restart BP med.  Informed Carrie per instructions given to daughter yesterday and that Dr. Royann Shivers will be notified for further instructions now that values received.  Verbalized understanding.  Stated pt also wants to know if he can have a glass of wine at night.  Informed Dr. Royann Shivers will be notified and RN will call pt/daughter and Arline Asp w/ [facility] back.  Verbalized understanding and agreed w/ plan.  Message forwarded to Dr. Royann Shivers to review BP/Weight/Request for wine and advise.

## 2013-06-12 NOTE — Telephone Encounter (Signed)
Returned call and informed Arline Asp per instructions by MD/PA.  Cindy asked about lasix and advised to continue to hold lasix and metolazone.  Cindy asked if pt could have a glass of wine in the evenings and informed a glass of red wine is okay.  Also asked about coumadin and informed per instructions given to pt's other daughter, Karen Kitchens, on yesterday by Kennon Rounds, pharmacist.  Repeated instructions, verbalized understanding and agreed w/ plan.  Will restart captopril tonight, 1/2 tab as ordered.  Call to [facility] and left message w/ update on pt.  Call office if questions.

## 2013-06-14 ENCOUNTER — Encounter: Payer: Self-pay | Admitting: Internal Medicine

## 2013-06-14 ENCOUNTER — Ambulatory Visit (INDEPENDENT_AMBULATORY_CARE_PROVIDER_SITE_OTHER): Payer: Medicare Other | Admitting: Internal Medicine

## 2013-06-14 VITALS — BP 110/68 | HR 80 | Ht 71.0 in | Wt 166.2 lb

## 2013-06-14 DIAGNOSIS — Z7901 Long term (current) use of anticoagulants: Secondary | ICD-10-CM

## 2013-06-14 DIAGNOSIS — Z950 Presence of cardiac pacemaker: Secondary | ICD-10-CM

## 2013-06-14 DIAGNOSIS — I5023 Acute on chronic systolic (congestive) heart failure: Secondary | ICD-10-CM

## 2013-06-14 DIAGNOSIS — T827XXS Infection and inflammatory reaction due to other cardiac and vascular devices, implants and grafts, sequela: Secondary | ICD-10-CM

## 2013-06-14 DIAGNOSIS — T889XXS Complication of surgical and medical care, unspecified, sequela: Secondary | ICD-10-CM

## 2013-06-14 HISTORY — DX: Presence of cardiac pacemaker: Z95.0

## 2013-06-14 MED ORDER — FUROSEMIDE 40 MG PO TABS: 40.0000 mg | ORAL_TABLET | Freq: Every day | ORAL | Status: DC

## 2013-06-14 MED ORDER — POTASSIUM CHLORIDE ER 10 MEQ PO TBCR: 10.0000 meq | EXTENDED_RELEASE_TABLET | Freq: Two times a day (BID) | ORAL | Status: DC

## 2013-06-14 NOTE — Assessment & Plan Note (Signed)
His chronic systolic heart failure symptoms are well compensated. His weight is under control. I've asked the patient to restart low dose Lasix and potassium. He will return in 3 days and undergo insertion of a biventricular pacemaker. Because of his advanced age and other comorbidities, biventricular ICD would not be recommended here.

## 2013-06-14 NOTE — Assessment & Plan Note (Signed)
Most recently, his warfarin level was 2.1. We will plan on doing his surgery on warfarin at this level.

## 2013-06-14 NOTE — Patient Instructions (Addendum)
Scheduled to have device re-implanted on Mon 06/17/13.  Need to check in at [facility] Entrance at 8:30am NOTHING to eat or drink after midnight on Sun Plan for one night stay.     Your physician has recommended you make the following change in your medication:  1) Increase your Potassium to 1 tablet twice daily 2) re-start Furosemide 40mg  daily

## 2013-06-14 NOTE — Progress Notes (Signed)
HPI Mr. Mcgrory returns today for followup. He is a pleasant 79 yo man with a h/o an ICM, chronic systolic CHF, HTN, CHB, s/p BiV ICD implant who developed a pocket infection and underwent extraction and re-insertion of a temp. Perm pacemaker 2 weeks ago. He has been stable in the interim except for problems with volume overload. He was initially place on lasix and metolazone but then developed pre-renal azotemia. His diuretics have been held. He was hypokalemic several days ago and his lasix was held as well. He denies syncope, fevers or chills.  No Known Allergies   Current Outpatient Prescriptions  Medication Sig Dispense Refill  . albuterol (PROVENTIL HFA;VENTOLIN HFA) 108 (90 BASE) MCG/ACT inhaler Inhale 2 puffs into the lungs every 6 (six) hours as needed for wheezing or shortness of breath.      . bimatoprost (LUMIGAN) 0.03 % ophthalmic solution Place 1 drop into both eyes at bedtime.       . budesonide-formoterol (SYMBICORT) 160-4.5 MCG/ACT inhaler Inhale 1 puff into the lungs 2 (two) times daily.      . captopril (CAPOTEN) 25 MG tablet Take 0.5 tablets (12.5 mg total) by mouth 2 (two) times daily.  90 tablet  2  . Coenzyme Q10 (COQ10) 100 MG CAPS Take 100 mg by mouth daily.       . COMBIGAN 0.2-0.5 % ophthalmic solution Place 1 drop into both eyes Twice daily.      . digoxin (LANOXIN) 0.125 MG tablet Take 1 tablet (125 mcg total) by mouth daily.  90 tablet  2  . dorzolamide (TRUSOPT) 2 % ophthalmic solution Place 1 drop into the left eye Twice daily.      . fish oil-omega-3 fatty acids 1000 MG capsule Take 2 g by mouth daily.      . folic acid-pyridoxine-cyancobalamin (FOLTX) 2.5-25-2 MG TABS Take 1 tablet by mouth daily.  30 each  11  . levothyroxine (SYNTHROID, LEVOTHROID) 125 MCG tablet Take 125 mcg by mouth daily before breakfast.      . magnesium oxide (MAG-OX) 400 MG tablet Take 400 mg by mouth daily.      . potassium chloride (KLOR-CON 10) 10 MEQ tablet Take 1 tablet (10 mEq  total) by mouth 2 (two) times daily.      . vitamin E 400 UNIT capsule Take 400 Units by mouth daily.        . warfarin (COUMADIN) 5 MG tablet Please take 5 mg and 2.5 mg alternating days       No current facility-administered medications for this visit.     Past Medical History  Diagnosis Date  . Ischemic cardiomyopathy     St. Jude BiV ICD  . Paroxysmal atrial fibrillation     sinus rhythm on amiodarone  . Asbestos exposure     plaques on CT  . Compression fracture     T7  . Pacemaker   . Hypertension   . Coronary artery disease   . Arthritis     "little, in my hands" (05/21/2013)  . COPD (chronic obstructive pulmonary disease)     pt denies this hx on 05/21/2013  . Automatic implantable cardioverter-defibrillator in situ   . Hypothyroidism   . CHF (congestive heart failure)     hx.    ROS:   All systems reviewed and negative except as noted in the HPI.   Past Surgical History  Procedure Laterality Date  . Inguinal hernia repair Right   . Coronary angioplasty      "  several through the years; 3 or 4 or 5" (05/21/2013)  . Coronary angioplasty with stent placement      "I have 1 stent" (05/21/2013)  . Coronary artery bypass graft  11/01/1994    SVG to first diagonal, to distal RCA, to the ramus intermedius artery, and LIMA the LAD  . Insert / replace / remove pacemaker  ?1999  . Cataract extraction w/ intraocular lens  implant, bilateral Bilateral   . Icd lead removal Left 05/27/2013    Procedure: ICD LEAD REMOVAL;  Surgeon: Yuriel Lopezmartinez W Roldan Laforest, MD;  Location: [facility] OR;  Service: Cardiovascular;  Laterality: Left;     Family History  Problem Relation Age of Onset  . Heart disease Father      History   Social History  . Marital Status: Widowed    Spouse Name: N/A    Number of Children: N/A  . 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 Smoking status: Never Smoker   . Smokeless tobacco:  Never Used  . Alcohol Use: 8.4 oz/week    14 Glasses of wine per week     Comment: 05/21/2013 "couple glasses of wine qd"  . Drug Use: No  . Sexually Active: No   Other Topics Concern  . Not on file   Social History Narrative  . No narrative on file     BP 110/68  Pulse 80  Ht 5' 11" (1.803 m)  Wt 166 lb 3.2 oz (75.388 kg)  BMI 23.19 kg/m2  Physical Exam:  Well appearing elderly man, NAD HEENT: Unremarkable Neck:  7 cm JVD, no thyromegally Back:  No CVA tenderness Lungs:  Clear except for rare basilar rales. No wheezes or rhonchi. Today I removed the Prolene sutures over his left-sided ICD extraction incision. Her HEART:  Regular rate rhythm, no murmurs, no rubs, no clicks Abd:  soft, positive bowel sounds, no organomegally, no rebound, no guarding Ext:  2 plus pulses, no edema, no cyanosis, no clubbing Skin:  No rashes no nodules Neuro:  CN II through XII intact, motor grossly intact   DEVICE  Normal device function.  See PaceArt for details.  His single chamber transvenous temporary permanent pacemaker is in place.  Assess/Plan: 

## 2013-06-14 NOTE — Assessment & Plan Note (Signed)
He is status post extraction with a temporary permanent pacemaker in place. He appears to have no evidence of any active or ongoing infection. My plan is to admit the patient to the hospital in 3 days and have him undergo insertion of a biventricular pacemaker on the contralateral side with removal of his temporary permanent pacing system.

## 2013-06-16 MED ORDER — CHLORHEXIDINE GLUCONATE 4 % EX LIQD: 60.0000 mL | Freq: Once | CUTANEOUS | Status: DC

## 2013-06-16 MED ORDER — SODIUM CHLORIDE 0.9 % IV SOLN
INTRAVENOUS | Status: DC
  Administered 2013-06-17: 10:00:00 via INTRAVENOUS

## 2013-06-16 MED ORDER — SODIUM CHLORIDE 0.9 % IR SOLN
80.0000 mg | Status: DC
  Filled 2013-06-16: qty 2

## 2013-06-16 MED ORDER — CEFAZOLIN SODIUM-DEXTROSE 2-3 GM-% IV SOLR
2.0000 g | INTRAVENOUS | Status: DC
  Filled 2013-06-16 (×2): qty 50

## 2013-06-17 ENCOUNTER — Encounter (HOSPITAL_COMMUNITY)
Admission: RE | Disposition: A | Discharge status: Home / Self Care | Payer: Self-pay | Source: Ambulatory Visit | Attending: Internal Medicine

## 2013-06-17 ENCOUNTER — Encounter (HOSPITAL_COMMUNITY): Payer: Self-pay | Admitting: General Practice

## 2013-06-17 ENCOUNTER — Ambulatory Visit (HOSPITAL_COMMUNITY)
Admission: RE | Admit: 2013-06-17 | Discharge: 2013-06-18 | Disposition: A | Discharge status: Home / Self Care | Payer: Medicare Other | Source: Ambulatory Visit | Attending: Internal Medicine | Admitting: Internal Medicine

## 2013-06-17 DIAGNOSIS — R339 Retention of urine, unspecified: Secondary | ICD-10-CM

## 2013-06-17 DIAGNOSIS — E039 Hypothyroidism, unspecified: Secondary | ICD-10-CM

## 2013-06-17 DIAGNOSIS — I10 Essential (primary) hypertension: Secondary | ICD-10-CM | POA: Insufficient documentation

## 2013-06-17 DIAGNOSIS — D71 Functional disorders of polymorphonuclear neutrophils: Secondary | ICD-10-CM

## 2013-06-17 DIAGNOSIS — I480 Paroxysmal atrial fibrillation: Secondary | ICD-10-CM

## 2013-06-17 DIAGNOSIS — H409 Unspecified glaucoma: Secondary | ICD-10-CM

## 2013-06-17 DIAGNOSIS — R0602 Shortness of breath: Secondary | ICD-10-CM

## 2013-06-17 DIAGNOSIS — Z8679 Personal history of other diseases of the circulatory system: Secondary | ICD-10-CM

## 2013-06-17 DIAGNOSIS — J449 Chronic obstructive pulmonary disease, unspecified: Secondary | ICD-10-CM

## 2013-06-17 DIAGNOSIS — I5023 Acute on chronic systolic (congestive) heart failure: Secondary | ICD-10-CM

## 2013-06-17 DIAGNOSIS — Z79899 Other long term (current) drug therapy: Secondary | ICD-10-CM | POA: Insufficient documentation

## 2013-06-17 DIAGNOSIS — I2510 Atherosclerotic heart disease of native coronary artery without angina pectoris: Secondary | ICD-10-CM

## 2013-06-17 DIAGNOSIS — N390 Urinary tract infection, site not specified: Secondary | ICD-10-CM

## 2013-06-17 DIAGNOSIS — I509 Heart failure, unspecified: Secondary | ICD-10-CM | POA: Insufficient documentation

## 2013-06-17 DIAGNOSIS — I5022 Chronic systolic (congestive) heart failure: Secondary | ICD-10-CM | POA: Insufficient documentation

## 2013-06-17 DIAGNOSIS — Z95810 Presence of automatic (implantable) cardiac defibrillator: Secondary | ICD-10-CM

## 2013-06-17 DIAGNOSIS — J45909 Unspecified asthma, uncomplicated: Secondary | ICD-10-CM

## 2013-06-17 DIAGNOSIS — I442 Atrioventricular block, complete: Secondary | ICD-10-CM | POA: Insufficient documentation

## 2013-06-17 DIAGNOSIS — Z7901 Long term (current) use of anticoagulants: Secondary | ICD-10-CM | POA: Insufficient documentation

## 2013-06-17 DIAGNOSIS — Z8639 Personal history of other endocrine, nutritional and metabolic disease: Secondary | ICD-10-CM

## 2013-06-17 DIAGNOSIS — R9430 Abnormal result of cardiovascular function study, unspecified: Secondary | ICD-10-CM

## 2013-06-17 DIAGNOSIS — J61 Pneumoconiosis due to asbestos and other mineral fibers: Secondary | ICD-10-CM

## 2013-06-17 DIAGNOSIS — R5381 Other malaise: Secondary | ICD-10-CM

## 2013-06-17 DIAGNOSIS — I2589 Other forms of chronic ischemic heart disease: Secondary | ICD-10-CM | POA: Insufficient documentation

## 2013-06-17 HISTORY — PX: BI-VENTRICULAR PACEMAKER INSERTION: SHX5462

## 2013-06-17 HISTORY — DX: Shortness of breath: R06.02

## 2013-06-17 HISTORY — PX: BI-VENTRICULAR PACEMAKER INSERTION (CRT-P): SHX5750

## 2013-06-17 LAB — BASIC METABOLIC PANEL
CO2: 28 mEq/L (ref 19–32)
Chloride: 95 mEq/L — ABNORMAL LOW (ref 96–112)
Glucose, Bld: 101 mg/dL — ABNORMAL HIGH (ref 70–99)
Potassium: 4.1 mEq/L (ref 3.5–5.1)
Sodium: 133 mEq/L — ABNORMAL LOW (ref 135–145)

## 2013-06-17 LAB — PROTIME-INR: INR: 1.78 — ABNORMAL HIGH (ref 0.00–1.49)

## 2013-06-17 SURGERY — BI-VENTRICULAR PACEMAKER INSERTION (CRT-P)
Anesthesia: LOCAL

## 2013-06-17 MED ORDER — YOU HAVE A PACEMAKER BOOK
Freq: Once | Status: AC
  Administered 2013-06-18: 05:00:00
  Filled 2013-06-17 (×2): qty 1

## 2013-06-17 MED ORDER — CEFAZOLIN SODIUM-DEXTROSE 2-3 GM-% IV SOLR
2.0000 g | Freq: Four times a day (QID) | INTRAVENOUS | Status: AC
  Administered 2013-06-17 – 2013-06-18 (×3): 2 g via INTRAVENOUS
  Filled 2013-06-17 (×3): qty 50

## 2013-06-17 MED ORDER — CAPTOPRIL 12.5 MG PO TABS
12.5000 mg | ORAL_TABLET | Freq: Two times a day (BID) | ORAL | Status: DC
  Administered 2013-06-17 – 2013-06-18 (×2): 12.5 mg via ORAL
  Filled 2013-06-17 (×3): qty 1

## 2013-06-17 MED ORDER — POTASSIUM CHLORIDE ER 10 MEQ PO TBCR
10.0000 meq | EXTENDED_RELEASE_TABLET | Freq: Two times a day (BID) | ORAL | Status: DC
  Administered 2013-06-17 – 2013-06-18 (×2): 10 meq via ORAL
  Filled 2013-06-17 (×4): qty 1

## 2013-06-17 MED ORDER — ONDANSETRON HCL 4 MG/2ML IJ SOLN: 4.0000 mg | Freq: Four times a day (QID) | INTRAMUSCULAR | Status: DC | PRN

## 2013-06-17 MED ORDER — MIDAZOLAM HCL 5 MG/5ML IJ SOLN
INTRAMUSCULAR | Status: AC
  Filled 2013-06-17: qty 5

## 2013-06-17 MED ORDER — BUDESONIDE-FORMOTEROL FUMARATE 160-4.5 MCG/ACT IN AERO
1.0000 | INHALATION_SPRAY | Freq: Two times a day (BID) | RESPIRATORY_TRACT | Status: DC
  Administered 2013-06-17 – 2013-06-18 (×2): 1 via RESPIRATORY_TRACT
  Filled 2013-06-17: qty 6

## 2013-06-17 MED ORDER — ALBUTEROL SULFATE HFA 108 (90 BASE) MCG/ACT IN AERS
2.0000 | INHALATION_SPRAY | Freq: Four times a day (QID) | RESPIRATORY_TRACT | Status: DC | PRN
  Filled 2013-06-17: qty 6.7

## 2013-06-17 MED ORDER — WARFARIN SODIUM 2.5 MG PO TABS
2.5000 mg | ORAL_TABLET | Freq: Once | ORAL | Status: AC
  Administered 2013-06-17: 2.5 mg via ORAL
  Filled 2013-06-17: qty 1

## 2013-06-17 MED ORDER — MUPIROCIN 2 % EX OINT
TOPICAL_OINTMENT | CUTANEOUS | Status: AC
  Filled 2013-06-17: qty 22

## 2013-06-17 MED ORDER — ACETAMINOPHEN 325 MG PO TABS
325.0000 mg | ORAL_TABLET | ORAL | Status: DC | PRN
  Administered 2013-06-17: 21:00:00 650 mg via ORAL
  Filled 2013-06-17: qty 2

## 2013-06-17 MED ORDER — FUROSEMIDE 40 MG PO TABS
40.0000 mg | ORAL_TABLET | Freq: Every day | ORAL | Status: DC
Start: 2013-06-17 — End: 2013-06-18
  Administered 2013-06-17 – 2013-06-18 (×2): 40 mg via ORAL
  Filled 2013-06-17 (×2): qty 1

## 2013-06-17 MED ORDER — FENTANYL CITRATE 0.05 MG/ML IJ SOLN
INTRAMUSCULAR | Status: AC
  Filled 2013-06-17: qty 2

## 2013-06-17 MED ORDER — BIMATOPROST 0.01 % OP SOLN
1.0000 [drp] | Freq: Every day | OPHTHALMIC | Status: DC
  Administered 2013-06-17: 21:00:00 1 [drp] via OPHTHALMIC
  Filled 2013-06-17: qty 2.5

## 2013-06-17 MED ORDER — MUPIROCIN 2 % EX OINT
TOPICAL_OINTMENT | Freq: Two times a day (BID) | CUTANEOUS | Status: DC
  Filled 2013-06-17: qty 22

## 2013-06-17 MED ORDER — DIGOXIN 125 MCG PO TABS
125.0000 ug | ORAL_TABLET | Freq: Every day | ORAL | Status: DC
  Administered 2013-06-18: 09:00:00 125 ug via ORAL
  Filled 2013-06-17 (×2): qty 1

## 2013-06-17 MED ORDER — LEVOTHYROXINE SODIUM 125 MCG PO TABS
125.0000 ug | ORAL_TABLET | Freq: Every day | ORAL | Status: DC
  Administered 2013-06-18: 05:00:00 125 ug via ORAL
  Filled 2013-06-17 (×2): qty 1

## 2013-06-17 MED ORDER — LIDOCAINE HCL (PF) 1 % IJ SOLN
INTRAMUSCULAR | Status: AC
  Filled 2013-06-17: qty 60

## 2013-06-17 MED ORDER — FA-PYRIDOXINE-CYANOCOBALAMIN 2.5-25-2 MG PO TABS
1.0000 | ORAL_TABLET | Freq: Every day | ORAL | Status: DC
  Administered 2013-06-17 – 2013-06-18 (×2): 1 via ORAL
  Filled 2013-06-17 (×2): qty 1

## 2013-06-17 MED ORDER — WARFARIN - PHYSICIAN DOSING INPATIENT: Freq: Every day | Status: DC

## 2013-06-17 MED ORDER — BIMATOPROST 0.03 % OP SOLN
1.0000 [drp] | Freq: Every day | OPHTHALMIC | Status: DC
  Filled 2013-06-17: qty 2.5

## 2013-06-17 MED ORDER — TRAMADOL HCL 50 MG PO TABS
25.0000 mg | ORAL_TABLET | ORAL | Status: AC
  Administered 2013-06-17: 25 mg via ORAL
  Filled 2013-06-17 (×2): qty 1

## 2013-06-17 NOTE — Interval H&P Note (Signed)
History and Physical Interval Note:  06/17/2013 9:58 AM  [name]  has presented today for surgery, with the diagnosis of Heart failure  The various methods of treatment have been discussed with the patient and family. After consideration of risks, benefits and other options for treatment, the patient has consented to  Procedure(s): BI-VENTRICULAR PACEMAKER INSERTION (CRT-P) (N/A) as a surgical intervention .  The patient's history has been reviewed, patient examined, no change in status, stable for surgery.  I have reviewed the patient's chart and labs.  Questions were answered to the patient's satisfaction.     Leonia Reeves.D.

## 2013-06-17 NOTE — H&P (View-Only) (Signed)
HPI [name] returns today for followup. He is a pleasant 79 yo man with a h/o an ICM, chronic systolic CHF, HTN, CHB, s/p BiV ICD implant who developed a pocket infection and underwent extraction and re-insertion of a temp. Perm pacemaker 2 weeks ago. He has been stable in the interim except for problems with volume overload. He was initially place on lasix and metolazone but then developed pre-renal azotemia. His diuretics have been held. He was hypokalemic several days ago and his lasix was held as well. He denies syncope, fevers or chills.  No Known Allergies   Current Outpatient Prescriptions  Medication Sig Dispense Refill  . albuterol (PROVENTIL HFA;VENTOLIN HFA) 108 (90 BASE) MCG/ACT inhaler Inhale 2 puffs into the lungs every 6 (six) hours as needed for wheezing or shortness of breath.      . bimatoprost (LUMIGAN) 0.03 % ophthalmic solution Place 1 drop into both eyes at bedtime.       . budesonide-formoterol (SYMBICORT) 160-4.5 MCG/ACT inhaler Inhale 1 puff into the lungs 2 (two) times daily.      . captopril (CAPOTEN) 25 MG tablet Take 0.5 tablets (12.5 mg total) by mouth 2 (two) times daily.  90 tablet  2  . Coenzyme Q10 (COQ10) 100 MG CAPS Take 100 mg by mouth daily.       . COMBIGAN 0.2-0.5 % ophthalmic solution Place 1 drop into both eyes Twice daily.      . digoxin (LANOXIN) 0.125 MG tablet Take 1 tablet (125 mcg total) by mouth daily.  90 tablet  2  . dorzolamide (TRUSOPT) 2 % ophthalmic solution Place 1 drop into the left eye Twice daily.      . fish oil-omega-3 fatty acids 1000 MG capsule Take 2 g by mouth daily.      . folic acid-pyridoxine-cyancobalamin (FOLTX) 2.5-25-2 MG TABS Take 1 tablet by mouth daily.  30 each  11  . levothyroxine (SYNTHROID, LEVOTHROID) 125 MCG tablet Take 125 mcg by mouth daily before breakfast.      . magnesium oxide (MAG-OX) 400 MG tablet Take 400 mg by mouth daily.      . potassium chloride (KLOR-CON 10) 10 MEQ tablet Take 1 tablet (10 mEq  total) by mouth 2 (two) times daily.      . vitamin E 400 UNIT capsule Take 400 Units by mouth daily.        Marland Kitchen warfarin (COUMADIN) 5 MG tablet Please take 5 mg and 2.5 mg alternating days       No current facility-administered medications for this visit.     Past Medical History  Diagnosis Date  . Ischemic cardiomyopathy     St. Jude BiV ICD  . Paroxysmal atrial fibrillation     sinus rhythm on amiodarone  . Asbestos exposure     plaques on CT  . Compression fracture     T7  . Pacemaker   . Hypertension   . Coronary artery disease   . Arthritis     "little, in my hands" (05/21/2013)  . COPD (chronic obstructive pulmonary disease)     pt denies this hx on 05/21/2013  . Automatic implantable cardioverter-defibrillator in situ   . Hypothyroidism   . CHF (congestive heart failure)     hx.    ROS:   All systems reviewed and negative except as noted in the HPI.   Past Surgical History  Procedure Laterality Date  . Inguinal hernia repair Right   . Coronary angioplasty      "  several through the years; 3 or 4 or 5" (05/21/2013)  . Coronary angioplasty with stent placement      "I have 1 stent" (05/21/2013)  . Coronary artery bypass graft  11/01/1994    SVG to first diagonal, to distal RCA, to the ramus intermedius artery, and LIMA the LAD  . Insert / replace / remove pacemaker  ?1999  . Cataract extraction w/ intraocular lens  implant, bilateral Bilateral   . Icd lead removal Left 05/27/2013    Procedure: ICD LEAD REMOVAL;  Surgeon: Marinus Maw, MD;  Location: [facility] OR;  Service: Cardiovascular;  Laterality: Left;     Family History  Problem Relation Age of Onset  . Heart disease Father      History   Social History  . Marital Status: Widowed    Spouse Name: N/A    Number of Children: N/A  . 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 Smoking status: Never Smoker   . Smokeless tobacco:  Never Used  . Alcohol Use: 8.4 oz/week    14 Glasses of wine per week     Comment: 05/21/2013 "couple glasses of wine qd"  . Drug Use: No  . Sexually Active: No   Other Topics Concern  . Not on file   Social History Narrative  . No narrative on file     BP 110/68  Pulse 80  Ht 5\' 11"  (1.803 m)  Wt 166 lb 3.2 oz (75.388 kg)  BMI 23.19 kg/m2  Physical Exam:  Well appearing elderly man, NAD HEENT: Unremarkable Neck:  7 cm JVD, no thyromegally Back:  No CVA tenderness Lungs:  Clear except for rare basilar rales. No wheezes or rhonchi. Today I removed the Prolene sutures over his left-sided ICD extraction incision. Her HEART:  Regular rate rhythm, no murmurs, no rubs, no clicks Abd:  soft, positive bowel sounds, no organomegally, no rebound, no guarding Ext:  2 plus pulses, no edema, no cyanosis, no clubbing Skin:  No rashes no nodules Neuro:  CN II through XII intact, motor grossly intact   DEVICE  Normal device function.  See PaceArt for details.  His single chamber transvenous temporary permanent pacemaker is in place.  Assess/Plan:

## 2013-06-17 NOTE — Op Note (Signed)
BiV PPM insertion via the right subclavian vein without immediate complication. W#[id number].

## 2013-06-18 ENCOUNTER — Ambulatory Visit (HOSPITAL_COMMUNITY): Payer: Medicare Other

## 2013-06-18 ENCOUNTER — Telehealth: Payer: Self-pay | Admitting: Cardiovascular Disease

## 2013-06-18 LAB — PROTIME-INR: Prothrombin Time: 18.3 seconds — ABNORMAL HIGH (ref 11.6–15.2)

## 2013-06-18 MED ORDER — TRAMADOL HCL 50 MG PO TABS
25.0000 mg | ORAL_TABLET | Freq: Once | ORAL | Status: AC
  Administered 2013-06-18: 01:00:00 25 mg via ORAL
  Filled 2013-06-18: qty 1

## 2013-06-18 NOTE — Telephone Encounter (Signed)
I would go back to Lasix/potassium 1 to 1 as in the past. Do not dismiss home health just yet. I would wait until his follow up visit.

## 2013-06-18 NOTE — Telephone Encounter (Signed)
Returned call and spoke w/ Dewayne Hatch, pt's daughter.  Stated pt was discharged today on 40 mg Lasix and wants to know if he is supposed to be taking K+ twice a day or once a day.  Stated in the past his K+ and Lasix were 1 to 1 and now they have him taking it twice a day.  Reviewed chart and pt was discharged on Lasix 40 mg daily and K+ 10 mEq BID.  Ann stated pt's last K+ in the hospital was 4.1 (confirmed).  Ann would like Dr. Royann Shivers to be notified for further instructions.  Verbalized understanding.  Also asked if pt still needs Home Health.  Message forwarded to Dr. Royann Shivers.

## 2013-06-18 NOTE — Op Note (Signed)
NAMELANELL, DUBIE               ACCOUNT NO.:  [id number]  MEDICAL RECORD NO.:  [id number]  LOCATION:  6C02C                        FACILITY:  [facility]  PHYSICIAN:  Doylene Canning. Ladona Ridgel, MD    DATE OF BIRTH:  Dec 05, 1925  DATE OF PROCEDURE: DATE OF DISCHARGE:                              OPERATIVE REPORT   PROCEDURE PERFORMED:  Insertion of a biventricular pacemaker.  INDICATION:  Ischemic cardiomyopathy, complete heart block, chronic atrial fibrillation, no ventricular escape, severe left ventricular dysfunction, ejection fraction 20%, who recently underwent extraction of a biventricular ICD on the contralateral side secondary to device erosion.  INTRODUCTION:  The patient is an 79 year old male with longstanding congestive heart failure, longstanding ischemic cardiomyopathy, and longstanding atrial fibrillation, and longstanding complete heart block who developed a pacemaker/ICD pocket infection several weeks ago and underwent extraction of his device with insertion of a temporary permanent transvenous pacemaker.  The patient is now referred for insertion of a new right-sided biventricular pacemaker.  Because of his advanced age, it was deemed most appropriate to utilize biventricular pacemaker rather than a biventricular ICD.  PROCEDURE IN DETAIL:  After informed was obtained, the patient was taken to the diagnostic EP lab in a fasting state.  After usual preparation and draping, intravenous fentanyl and midazolam was given for sedation. A 30 mL of lidocaine was infiltrated into the right infraclavicular region.  A 5-cm incision was carried out over this region. Electrocautery was utilized to dissect down to the fascial plane.  The right subclavian vein was punctured x2 and the St. Jude model 2088T 52 cm active fixation pacing lead serial number [id number] being advanced into the right ventricle.  Mapping was carried out in the right ventricle into final site.  The paced R-waves were  13 with the lead actively fixed the impedance was 570 ohms and the threshold 0.8 V at 0.4 milliseconds.  10 V pacing did not stimulate the diaphragm.  With these satisfactory parameters, attention was then turned to placement of the left ventricular lead.  The guiding catheter along with a 6-French Hexapolar EP catheter was advanced into the right atrium.  The coronary sinus was cannulated with modest difficulty.  Venography of the coronary sinus was carried out.  This demonstrated a lateral vein as well as the high lateral vein which were small and very tortuous.  Initial attempts to cannulate the lateral vein were successful, but the LV lead could not be advanced into the vein because of its size and very acute angle with regard to the coronary sinus.  After approximately 40 minutes of trying, this vein was abandoned and attempted to place the LV lead in the posterior vein were carried out.  The posterior vein was more proximal in nature that was initially perceived requiring a much longer than direct normal time for insertion of the LV lead which was ultimately carried out successfully with the St. Jude Quartet model 14580Q 86 cm lead serial number [id number] being advanced into the posterior vein of the left ventricle and at this location, there was no diaphragmatic stimulation.  Pacing thresholds were chronically elevated, but at the final site, from the electrode 3 to the can threshold was 2  at 0.5 milliseconds and the lead was liberated from the guiding catheter in the usual manner and the leads were secured to the subpectoral fascia with a figure-of-eight silk suture and the sewing sleeve was secured with silk suture.  An electrocautery was utilized to assure hemostasis, and antibiotic irrigation was utilized to irrigate the pocket.  The St. Jude Allure Quadra RF dual-chamber biventricular pacemaker was connected to the RV and LV leads.  The atrial lead was capped because of his  chronic AFib, and the device serial number was [id number] and the pocket was again irrigated with antibiotic irrigation.  The incision was closed with 2-0 and 3-0 Vicryl.  Benzoin and Steri-Strips were painted on the skin.  A pressure dressing was applied.  At this point, our attention was turned to removal of the temporary permanent pacing lead, which had been placed previously in the left internal jugular vein.  This lead was carried out, but this lead was extracted with gentle traction after the helix was withdrawn into the lead and the lead was freed up from its silk suture without immediate procedure complication.  Pressure dressing was placed and the patient was returned to his room in satisfactory condition.  COMPLICATIONS:  There were no immediate procedure complications.  RESULTS:  Demonstrate successful Bi V pacemaker insertion in a patient with complete heart block, prolonged ischemic cardiomyopathy, ejection fraction 20%, and chronic systolic heart failure and chronic atrial fibrillation.     Doylene Canning. Ladona Ridgel, MD     GWT/MEDQ  D:  06/17/2013  T:  06/18/2013  Job:  [id number]  cc:   Gerlene Burdock A. Alanda Amass, M.D. Thurmon Fair, MD

## 2013-06-18 NOTE — Telephone Encounter (Signed)
Please call Ann-question about his medicine.

## 2013-06-18 NOTE — Progress Notes (Signed)
   ELECTROPHYSIOLOGY ROUNDING NOTE    Patient Name: [name] Date of Encounter: 06/18/2013    SUBJECTIVE:Patient feels well this morning.  Minimal incisional soreness. Feels as though is breathing is improved. S/p CRTP implant yesterday with removal of left sided temp perm pacemaker.   TELEMETRY: Reviewed telemetry pt in atrial fibrillation with ventricular pacing Filed Vitals:   06/17/13 2009 06/17/13 2244 06/18/13 0044 06/18/13 0447  BP: 128/67  118/65 118/61  Pulse: 74  80 74  Temp: 97.4 F (36.3 C)  97.6 F (36.4 C) 97.6 F (36.4 C)  TempSrc: Oral  Oral Oral  Resp: 16  18 20   Height:      Weight:   162 lb 7.7 oz (73.7 kg)   SpO2: 100% 98% 98% 97%    Intake/Output Summary (Last 24 hours) at 06/18/13 0643 Last data filed at 06/18/13 0111  Gross per 24 hour  Intake    460 ml  Output   1325 ml  Net   -865 ml    CURRENT MEDICATIONS: . bimatoprost  1 drop Both Eyes QHS  . budesonide-formoterol  1 puff Inhalation BID  . captopril  12.5 mg Oral BID  . digoxin  125 mcg Oral Daily  . folic acid-pyridoxine-cyancobalamin  1 tablet Oral Daily  . furosemide  40 mg Oral Daily  . levothyroxine  125 mcg Oral QAC breakfast  . potassium chloride  10 mEq Oral BID  . Warfarin - Physician Dosing Inpatient   Does not apply q1800    LABS: Basic Metabolic Panel:  Recent Labs  96/04/54 0919  NA 133*  K 4.1  CL 95*  CO2 28  GLUCOSE 101*  BUN 48*  CREATININE 1.32  CALCIUM 9.2   INR: 1.57  Radiology/Studies:  Pending  PHYSICAL EXAM Right chest without hematoma  DEVICE INTERROGATION: Device interrogation pending  Wound care, restrictions reviewed with patient.  Routine follow up scheduled.   Assessment/Plan per Dr Ladona Ridgel  EP Attending  Patient seen and examined. Agree with above exam, assessment and plan.  Leonia Reeves.D.

## 2013-06-18 NOTE — Discharge Summary (Signed)
ELECTROPHYSIOLOGY PROCEDURE DISCHARGE SUMMARY    Patient ID: [name],  MRN: [id number], DOB/AGE: 19-Jun-1926 79 y.o.  Admit date: 06/17/2013 Discharge date: 06/18/2013  Primary Care Physician: Sharlet Salina, MD Primary Cardiologist: Thurmon Fair, MD Electrophysiologist: Lewayne Bunting, MD  Primary Discharge Diagnosis:  Ischemic cardiomyopathy s/p CRTP implantation this admission  Secondary Discharge Diagnosis:  1.  Congestive heart failure 2.  Complete heart block 3.  Hypertension 4.  Prior ICD pocket infection s/p system removal and insertion of temp-perm pacemaker 05/2013  Procedures This Admission:  1.  Implantation of a cardiac resynchronization therapy pacemaker on 06-17-2013 by Dr Ladona Ridgel.  The patient received a STJ Allura Quadra CRTP with model number 2088 right ventricular lead and model number [id number] left ventricular lead.  The previously implanted temp-perm pacemaker was removed.  There were no early immediate complications. 2.  CXR on 06-18-2013 demonstrated no pneumothorax status post device implant.   Brief HPI: [name] is an 79 yo man with a h/o an ICM, chronic systolic CHF, HTN, CHB, s/p BiV ICD implant who developed a pocket infection and underwent extraction and re-insertion of a temp. Perm pacemaker 2 weeks ago. He has been stable in the interim except for problems with volume overload. He was initially place on lasix and metolazone but then developed pre-renal azotemia. His diuretics have been held. He was hypokalemic several days ago and his lasix was held as well. He denies syncope, fevers or chills.  Risks, benefits, and alternatives to CRTP re-implantation were discussed with the patient who wished to proceed.  Hospital Course:  The patient was admitted and underwent implantation of a cardiac resynchronization therapy pacemaker with details as outlined above.   He was monitored on telemetry overnight which demonstrated atrial fibrillation with ventricular  pacing.  Right chest was without hematoma or ecchymosis.  The device was interrogated and found to be functioning normally.  CXR was obtained and demonstrated no pneumothorax status post device implantation.  Wound care, arm mobility, and restrictions were reviewed with the patient.  Dr Ladona Ridgel examined the patient and considered them stable for discharge to home.    Discharge Vitals: Blood pressure 120/72, pulse 73, temperature 97.7 F (36.5 C), temperature source Oral, resp. rate 18, height 5\' 11"  (1.803 m), weight 162 lb 7.7 oz (73.7 kg), SpO2 98.00%.    Labs:   Lab Results  Component Value Date   WBC 4.7 06/04/2013   HGB 10.3* 06/04/2013   HCT 31.4* 06/04/2013   MCV 89.2 06/04/2013   PLT 132* 06/04/2013    Recent Labs Lab 06/17/13 0919  NA 133*  K 4.1  CL 95*  CO2 28  BUN 48*  CREATININE 1.32  CALCIUM 9.2  GLUCOSE 101*   No results found for this basename: CKTOTAL, CKMB, CKMBINDEX, TROPONINI    Lab Results  Component Value Date   CHOL 152 12/13/2012   CHOL 123 04/24/2012   Lab Results  Component Value Date   HDL 28* 12/13/2012   HDL 30* 04/24/2012   Lab Results  Component Value Date   LDLCALC 92 12/13/2012   LDLCALC 64 04/24/2012   Lab Results  Component Value Date   TRIG 159* 12/13/2012   TRIG 147 04/24/2012   Lab Results  Component Value Date   CHOLHDL 5.4 12/13/2012   CHOLHDL 4.1 04/24/2012   No results found for this basename: LDLDIRECT   No results found for this basename: DDIMER     Discharge Medications:    Medication List  albuterol 108 (90 BASE) MCG/ACT inhaler  Commonly known as:  PROVENTIL HFA;VENTOLIN HFA  Inhale 2 puffs into the lungs every 6 (six) hours as needed for wheezing or shortness of breath.     bimatoprost 0.03 % ophthalmic solution  Commonly known as:  LUMIGAN  Place 1 drop into both eyes at bedtime.     budesonide-formoterol 160-4.5 MCG/ACT inhaler  Commonly known as:  SYMBICORT  Inhale 1 puff into the lungs 2 (two) times  daily.     captopril 25 MG tablet  Commonly known as:  CAPOTEN  Take 0.5 tablets (12.5 mg total) by mouth 2 (two) times daily.     COMBIGAN 0.2-0.5 % ophthalmic solution  Generic drug:  brimonidine-timolol  Place 1 drop into both eyes Twice daily.     CoQ10 100 MG Caps  Take 100 mg by mouth daily.     digoxin 0.125 MG tablet  Commonly known as:  LANOXIN  Take 1 tablet (125 mcg total) by mouth daily.     dorzolamide 2 % ophthalmic solution  Commonly known as:  TRUSOPT  Place 1 drop into the left eye Twice daily.     fish oil-omega-3 fatty acids 1000 MG capsule  Take 2 g by mouth daily.     folic acid-pyridoxine-cyancobalamin 2.5-25-2 MG Tabs  Commonly known as:  FOLTX  Take 1 tablet by mouth daily.     furosemide 40 MG tablet  Commonly known as:  LASIX  Take 1 tablet (40 mg total) by mouth daily.     levothyroxine 125 MCG tablet  Commonly known as:  SYNTHROID, LEVOTHROID  Take 125 mcg by mouth daily before breakfast.     magnesium oxide 400 MG tablet  Commonly known as:  MAG-OX  Take 400 mg by mouth daily.     potassium chloride 10 MEQ tablet  Commonly known as:  KLOR-CON 10  Take 1 tablet (10 mEq total) by mouth 2 (two) times daily.     vitamin E 400 UNIT capsule  Take 400 Units by mouth daily.     warfarin 5 MG tablet  Commonly known as:  COUMADIN  Please take 5 mg and 2.5 mg alternating days        Disposition:      Discharge Orders   Future Appointments Provider Department Dept Phone   06/20/2013 12:10 PM Phillips Hay, RPH-CPP [facility] [place] [phone]   06/27/2013 2:30 PM [facility] Main [facility] [place]) [phone]   Future Orders Complete By Expires     Diet - low sodium heart healthy  As directed     Discharge instructions  As directed     Comments:      Please see post pacemaker implant discharge instructions    Increase activity slowly  As directed       Follow-up Information    Follow up with [facility] On 06/27/2013. (At 2:30 PM for wound check)    Contact information:   [address] [place] [address] [phone]      Follow up with Lewayne Bunting, MD In 3 months. (Our office to schedule)    Contact information:   [address] [place] [address] [phone]      Follow up with [facility] On 06/20/2013. (At 12:10 PM for Coumadin follow-up)    Contact information:   [address]  [address] [place] [address] [phone]  Duration of Discharge Encounter: Greater than 30 minutes including physician time.  Signed, Gypsy Balsam, RN, BSN 06/18/2013, 10:07 AM  EP Attending  Patient seen and examined. Agree with above exam, assessment and plan.   Leonia Reeves.D.

## 2013-06-19 NOTE — Telephone Encounter (Signed)
Returned call and spoke w/ Eliezer Lofts, pt's daughter-in-law.  Informed per instructions by MD.  Verbalized understanding and agreed to inform pt.

## 2013-06-20 ENCOUNTER — Ambulatory Visit (INDEPENDENT_AMBULATORY_CARE_PROVIDER_SITE_OTHER): Payer: Medicare Other | Admitting: Pharmacist Clinician (PhC)/ Clinical Pharmacy Specialist

## 2013-06-20 DIAGNOSIS — I480 Paroxysmal atrial fibrillation: Secondary | ICD-10-CM

## 2013-06-20 DIAGNOSIS — I4891 Unspecified atrial fibrillation: Secondary | ICD-10-CM

## 2013-06-20 DIAGNOSIS — Z7901 Long term (current) use of anticoagulants: Secondary | ICD-10-CM

## 2013-06-20 LAB — POCT INR: INR: 1.6

## 2013-06-26 NOTE — Telephone Encounter (Signed)
LMOM FOR RETUN CALL/KWM

## 2013-06-26 NOTE — Telephone Encounter (Signed)
New problem    Patient calling speaking with nurse to inform them that the pacemaker has started bleeding yesterday - blood clot , however, it's stopped bleeding pt has appt for tomorrow

## 2013-06-27 ENCOUNTER — Encounter: Payer: Self-pay | Admitting: Internal Medicine

## 2013-06-27 ENCOUNTER — Ambulatory Visit (INDEPENDENT_AMBULATORY_CARE_PROVIDER_SITE_OTHER): Payer: Medicare Other | Admitting: *Deleted

## 2013-06-27 DIAGNOSIS — I5023 Acute on chronic systolic (congestive) heart failure: Secondary | ICD-10-CM

## 2013-06-27 DIAGNOSIS — I442 Atrioventricular block, complete: Secondary | ICD-10-CM

## 2013-06-27 LAB — PACEMAKER DEVICE OBSERVATION
LV LEAD THRESHOLD: 1.75 V
RV LEAD THRESHOLD: 0.75 V
VENTRICULAR PACING PM: 100

## 2013-06-27 NOTE — Telephone Encounter (Signed)
Left message.  Patient has appointment today @2 :30pm.

## 2013-06-27 NOTE — Progress Notes (Signed)
Wound check in office PPM

## 2013-07-03 ENCOUNTER — Telehealth: Payer: Self-pay | Admitting: Pharmacist Clinician (PhC)/ Clinical Pharmacy Specialist

## 2013-07-03 ENCOUNTER — Ambulatory Visit (INDEPENDENT_AMBULATORY_CARE_PROVIDER_SITE_OTHER): Payer: Medicare Other | Admitting: Pharmacist Clinician (PhC)/ Clinical Pharmacy Specialist

## 2013-07-03 DIAGNOSIS — I480 Paroxysmal atrial fibrillation: Secondary | ICD-10-CM

## 2013-07-03 DIAGNOSIS — I4891 Unspecified atrial fibrillation: Secondary | ICD-10-CM

## 2013-07-03 DIAGNOSIS — Z7901 Long term (current) use of anticoagulants: Secondary | ICD-10-CM

## 2013-07-03 NOTE — Telephone Encounter (Signed)
PT is 28.6 and INR 2.4

## 2013-07-03 NOTE — Telephone Encounter (Signed)
See anticoag note

## 2013-07-10 ENCOUNTER — Telehealth: Payer: Self-pay | Admitting: Cardiovascular Disease

## 2013-07-10 ENCOUNTER — Ambulatory Visit (INDEPENDENT_AMBULATORY_CARE_PROVIDER_SITE_OTHER): Payer: Medicare Other | Admitting: Pharmacist Clinician (PhC)/ Clinical Pharmacy Specialist

## 2013-07-10 DIAGNOSIS — Z7901 Long term (current) use of anticoagulants: Secondary | ICD-10-CM

## 2013-07-10 DIAGNOSIS — I480 Paroxysmal atrial fibrillation: Secondary | ICD-10-CM

## 2013-07-10 DIAGNOSIS — I4891 Unspecified atrial fibrillation: Secondary | ICD-10-CM

## 2013-07-10 NOTE — Telephone Encounter (Signed)
Did his INR and its 3.1 and he took 5 mg of coumadin yesterday evening.. Please Call him at [phone]   Thanks

## 2013-07-17 ENCOUNTER — Ambulatory Visit (INDEPENDENT_AMBULATORY_CARE_PROVIDER_SITE_OTHER): Payer: Medicare Other | Admitting: Pharmacist Clinician (PhC)/ Clinical Pharmacy Specialist

## 2013-07-17 VITALS — BP 142/70 | HR 80

## 2013-07-17 DIAGNOSIS — I480 Paroxysmal atrial fibrillation: Secondary | ICD-10-CM

## 2013-07-17 DIAGNOSIS — I4891 Unspecified atrial fibrillation: Secondary | ICD-10-CM

## 2013-07-17 DIAGNOSIS — Z7901 Long term (current) use of anticoagulants: Secondary | ICD-10-CM

## 2013-07-17 LAB — POCT INR: INR: 2.3

## 2013-07-22 ENCOUNTER — Ambulatory Visit (INDEPENDENT_AMBULATORY_CARE_PROVIDER_SITE_OTHER): Payer: Medicare Other | Admitting: Cardiology

## 2013-07-22 ENCOUNTER — Encounter: Payer: Self-pay | Admitting: Cardiology

## 2013-07-22 VITALS — BP 118/62 | HR 80 | Ht 71.0 in | Wt 172.0 lb

## 2013-07-22 DIAGNOSIS — Z95810 Presence of automatic (implantable) cardiac defibrillator: Secondary | ICD-10-CM

## 2013-07-22 DIAGNOSIS — I5023 Acute on chronic systolic (congestive) heart failure: Secondary | ICD-10-CM

## 2013-07-22 DIAGNOSIS — R600 Localized edema: Secondary | ICD-10-CM | POA: Insufficient documentation

## 2013-07-22 DIAGNOSIS — Z7901 Long term (current) use of anticoagulants: Secondary | ICD-10-CM

## 2013-07-22 DIAGNOSIS — I4891 Unspecified atrial fibrillation: Secondary | ICD-10-CM

## 2013-07-22 DIAGNOSIS — I4821 Permanent atrial fibrillation: Secondary | ICD-10-CM

## 2013-07-22 DIAGNOSIS — R609 Edema, unspecified: Secondary | ICD-10-CM

## 2013-07-22 DIAGNOSIS — I2510 Atherosclerotic heart disease of native coronary artery without angina pectoris: Secondary | ICD-10-CM

## 2013-07-22 HISTORY — DX: Permanent atrial fibrillation: I48.21

## 2013-07-22 MED ORDER — METOLAZONE 5 MG PO TABS: 5.0000 mg | ORAL_TABLET | ORAL | Status: DC

## 2013-07-22 NOTE — Assessment & Plan Note (Signed)
Denies any chest pain

## 2013-07-22 NOTE — Progress Notes (Signed)
07/22/2013   PCP: Margaree Mackintosh, MD   Chief Complaint  Patient presents with  . Leg Swelling    bilateral lower extremity edema, doesn't improve overnight, has been occuring x 3-4 weeks    Primary Cardiologist: Dylan. Royann [name]  HPI:  [name] is an 79y/o pt followed by our group with a history of CAD and ICM. He had CABG X 4 in '95. He had a pacemaker placed in 1999 after he had a syncopal spell while [place] a [place]. He had EOL Gen change in 2006. Cath in 2009 revealed patent grafts but severe LVD. He had a BiV ICD placed in 2009 by [name]. [name] notes he has PAF and is pacer dependent. He had a device change 12/13 by [name]. We saw him in the office 05/21/13 when he presented with erosion of the skin around his device. He was admitted and ultimately had extraction of his device and leads by [name] 05/27/13 with implantation of a temporary permanent transvenous system. He was discharged on 05/29/13 and re admitted 05/31/13 with acute CHF. His discharge wgt was 176. He was also found to have Lt arm DVT with occlusion of his Lt IJ and partial occlusion of the Lt [place] vein and axillary vein. He was placed on Coumadin. He also has permanent atrial fibrillation.  June 17, 2013 he returned to the hospital for implantation of cardiac resynchronization therapy pacemaker. He received a [employer]. Jude USAA CRTP with model number 2088 right ventricular lead and model number [id number] left ventricular lead.    Last week he was seen for Coumadin check and complained of lower extremity edema and his diuretic gross amount was increased from 40 to 80 mg daily. Despite this his edema continues. He denies shortness of breath no chest pain no other complaints he is able to sleep flat in bed.  No Known Allergies  Current Outpatient Prescriptions  Medication Sig Dispense Refill  . albuterol (PROVENTIL HFA;VENTOLIN HFA) 108 (90 BASE) MCG/ACT inhaler Inhale 2 puffs into the lungs every 6 (six)  hours as needed for wheezing or shortness of breath.      . bimatoprost (LUMIGAN) 0.03 % ophthalmic solution Place 1 drop into both eyes at bedtime.       . budesonide-formoterol (SYMBICORT) 160-4.5 MCG/ACT inhaler Inhale 1 puff into the lungs 2 (two) times daily.      . captopril (CAPOTEN) 25 MG tablet Take 0.5 tablets (12.5 mg total) by mouth 2 (two) times daily.  90 tablet  2  . Coenzyme Q10 (COQ10) 100 MG CAPS Take 100 mg by mouth daily.       . COMBIGAN 0.2-0.5 % ophthalmic solution Place 1 drop into both eyes Twice daily.      . digoxin (LANOXIN) 0.125 MG tablet Take 1 tablet (125 mcg total) by mouth daily.  90 tablet  2  . dorzolamide (TRUSOPT) 2 % ophthalmic solution Place 1 drop into the left eye Twice daily.      . fish oil-omega-3 fatty acids 1000 MG capsule Take 1 g by mouth 3 (three) times daily.       . folic acid-pyridoxine-cyancobalamin (FOLTX) 2.5-25-2 MG TABS Take 1 tablet by mouth daily.  30 each  11  . furosemide (LASIX) 40 MG tablet Take 80 mg by mouth daily.      Marland Kitchen levothyroxine (SYNTHROID, LEVOTHROID) 125 MCG tablet Take 125 mcg by mouth daily before breakfast.      .  magnesium oxide (MAG-OX) 400 MG tablet Take 400 mg by mouth daily.      . potassium chloride (KLOR-CON 10) 10 MEQ tablet Take 1 tablet (10 mEq total) by mouth 2 (two) times daily.      . vitamin E 400 UNIT capsule Take 400 Units by mouth daily.        Marland Kitchen warfarin (COUMADIN) 5 MG tablet Please take 5 mg and 2.5 mg alternating days      . metolazone (ZAROXOLYN) 5 MG tablet Take 1 tablet (5 mg total) by mouth 3 (three) times a week.  15 tablet  2   No current facility-administered medications for this visit.    Past Medical History  Diagnosis Date  . Ischemic cardiomyopathy     St. Jude BiV ICD  . Paroxysmal atrial fibrillation     sinus rhythm on amiodarone  . Asbestos exposure     plaques on CT  . Compression fracture     T7  . Pacemaker   . Hypertension   . Coronary artery disease   . Automatic  implantable cardioverter-defibrillator in situ   . Hypothyroidism   . CHF (congestive heart failure)     hx.  . Myocardial infarction     "2 a long time ago" (06/17/2013)  . Shortness of breath     "only when external pacemaker wasn't working right" (06/17/2013)  . Arthritis     "a touch all over" (06/17/2013)  . COPD (chronic obstructive pulmonary disease)     "a touch" (8/4//2014)  . Atrial fibrillation, permanent 07/22/2013    Past Surgical History  Procedure Laterality Date  . Inguinal hernia repair Right   . Coronary artery bypass graft  11/01/1994    SVG to first diagonal, to distal RCA, to the ramus intermedius artery, and LIMA the LAD  . Insert / replace / remove pacemaker  ?1999  . Cataract extraction w/ intraocular lens  implant, bilateral Bilateral   . Icd lead removal Left 05/27/2013    Procedure: ICD LEAD REMOVAL;  Surgeon: Marinus Maw, MD;  Location: [facility] OR;  Service: Cardiovascular;  Laterality: Left;  . Bi-ventricular pacemaker insertion (crt-p) Right 06/17/2013  . Pacemaker removal Left 05/2013  . Coronary angioplasty      "several through the years; 3 or 4 or 5" (05/21/2013)  . Coronary angioplasty with stent placement      "I have 1 stent" (05/21/2013)    ZOX:WRUEAVW:UJ colds or fevers, no weight changes Skin:no rashes or ulcers HEENT:no blurred vision, no congestion CV:see HPI PUL:see HPI GI:no diarrhea constipation or melena, no indigestion GU:no hematuria, no dysuria MS:no joint pain, no claudication Neuro:no syncope, no lightheadedness Endo:no diabetes, no thyroid disease  PHYSICAL EXAM BP 118/62  Pulse 80  Ht 5\' 11"  (1.803 m)  Wt 172 lb (78.019 kg)  BMI 24 kg/m2  SpO2 94% General:Pleasant affect, NAD Skin:Warm and dry, brisk capillary refill HEENT:normocephalic, sclera clear, mucus membranes moist Neck:supple, mild JVD, no bruits  Heart: RRR without murmur, gallup, rub or click Lungs:clear without rales, rhonchi, or wheezes WJX:BJYN, non tender, + BS,  do not palpate liver spleen or masses Ext:1-2+ lower ext edema, + varocosities Neuro:alert and oriented, MAE, follows commands, + facial symmetry Chest:  Pacer site without erythemia   ASSESSMENT AND PLAN Bilateral leg edema Bilateral lower extremity edema increased dose of furosemide has not helped. He denies any shortness of breath he is able to sleep flat. I'm adding back the Zaroxolyn 5 mg 30 minutes prior  to his Lasix for Tuesday Wednesday and Friday of this week and thereafter Monday Wednesday and Friday.  I'll see him back next week for further evaluation. Loss basic metabolic panel drawn  Acute on chronic systolic heart failure- re admitted 06/01/13 No shortness of breath currently.  BiV ICD implanted 2009 (responder) New generator implanted last month.  CAD- CABG X 4 '95, patent grafts '09 Denies any chest pain.  Atrial fibrillation, permanent Permanent atrial fibrillation, denies any palpitations.  Long term (current) use of anticoagulants For permanent atrial fibrillation as well as recent upper extremity DVT

## 2013-07-22 NOTE — Assessment & Plan Note (Signed)
No shortness of breath currently.

## 2013-07-22 NOTE — Assessment & Plan Note (Signed)
Bilateral lower extremity edema increased dose of furosemide has not helped. He denies any shortness of breath he is able to sleep flat. I'm adding back the Zaroxolyn 5 mg 30 minutes prior to his Lasix for Tuesday Wednesday and Friday of this week and thereafter Monday Wednesday and Friday.  I'll see him back next week for further evaluation. Loss basic metabolic panel drawn

## 2013-07-22 NOTE — Patient Instructions (Signed)
Take metolazone 5 mg in am and on Wed. Am 30 min prior to Furosemide.  Then only take on Monday Wed. And Friday  Have lab work done on  Thursday.  See me back in 1 week  Weigh daily Call [phone] if weight climbs more than 3 pounds in a day or 5 pounds in a week. No salt to very little salt in your diet.  No more than 2000 mg in a day. Call if increased shortness of breath or increased swelling.

## 2013-07-22 NOTE — Assessment & Plan Note (Signed)
Permanent atrial fibrillation, denies any palpitations.

## 2013-07-22 NOTE — Assessment & Plan Note (Signed)
For permanent atrial fibrillation as well as recent upper extremity DVT

## 2013-07-22 NOTE — Assessment & Plan Note (Signed)
New generator implanted last month.

## 2013-07-23 ENCOUNTER — Encounter: Payer: Self-pay | Admitting: Cardiology

## 2013-07-30 ENCOUNTER — Telehealth: Payer: Self-pay | Admitting: Cardiovascular Disease

## 2013-07-30 NOTE — Telephone Encounter (Signed)
Returned call.  Left message labs already ordered and in computer system.  Go to [facility] lab to have drawn.  Call back today before 4pm w/ questions.

## 2013-07-30 NOTE — Telephone Encounter (Signed)
Would you please send his lab order sown to [facility] is going to have his lab work on Thursday.

## 2013-07-31 ENCOUNTER — Ambulatory Visit: Payer: Medicare Other | Admitting: Cardiology

## 2013-08-01 LAB — BASIC METABOLIC PANEL WITH GFR
CO2: 32 mEq/L (ref 19–32)
Calcium: 8.9 mg/dL (ref 8.4–10.5)
Creat: 1.1 mg/dL (ref 0.50–1.35)

## 2013-08-02 ENCOUNTER — Telehealth: Payer: Self-pay | Admitting: Cardiology

## 2013-08-02 NOTE — Telephone Encounter (Signed)
Returning your call from yesterday. °

## 2013-08-02 NOTE — Telephone Encounter (Signed)
Returned call.  Pt stated he talked to [facility] yesterday and someone was supposed to call him back to schedule an appt.  Pt transferred to scheduling after RN confirmed he did take extra K+.

## 2013-08-06 ENCOUNTER — Ambulatory Visit (INDEPENDENT_AMBULATORY_CARE_PROVIDER_SITE_OTHER): Payer: Medicare Other | Admitting: Cardiology

## 2013-08-06 ENCOUNTER — Encounter: Payer: Self-pay | Admitting: Cardiology

## 2013-08-06 ENCOUNTER — Ambulatory Visit (INDEPENDENT_AMBULATORY_CARE_PROVIDER_SITE_OTHER): Payer: Medicare Other | Admitting: Pharmacist Clinician (PhC)/ Clinical Pharmacy Specialist

## 2013-08-06 VITALS — BP 124/74 | HR 68 | Ht 71.0 in | Wt 170.8 lb

## 2013-08-06 DIAGNOSIS — E876 Hypokalemia: Secondary | ICD-10-CM

## 2013-08-06 DIAGNOSIS — Z7901 Long term (current) use of anticoagulants: Secondary | ICD-10-CM

## 2013-08-06 DIAGNOSIS — R600 Localized edema: Secondary | ICD-10-CM

## 2013-08-06 DIAGNOSIS — I4821 Permanent atrial fibrillation: Secondary | ICD-10-CM

## 2013-08-06 DIAGNOSIS — I2510 Atherosclerotic heart disease of native coronary artery without angina pectoris: Secondary | ICD-10-CM

## 2013-08-06 DIAGNOSIS — I4891 Unspecified atrial fibrillation: Secondary | ICD-10-CM

## 2013-08-06 DIAGNOSIS — R609 Edema, unspecified: Secondary | ICD-10-CM

## 2013-08-06 LAB — BASIC METABOLIC PANEL WITH GFR
BUN: 29 mg/dL — ABNORMAL HIGH (ref 6–23)
CO2: 31 mEq/L (ref 19–32)
Calcium: 9.5 mg/dL (ref 8.4–10.5)
Creat: 0.99 mg/dL (ref 0.50–1.35)
Glucose, Bld: 97 mg/dL (ref 70–99)

## 2013-08-06 LAB — POCT INR: INR: 2.7

## 2013-08-06 NOTE — Assessment & Plan Note (Signed)
K+ low last check, replaced but will recheck .

## 2013-08-06 NOTE — Assessment & Plan Note (Signed)
On coumadin 

## 2013-08-06 NOTE — Progress Notes (Signed)
08/06/2013   PCP: Margaree Mackintosh, MD   Chief Complaint  Patient presents with  . Follow-up    2 weeks:  No chest pain, occas. SOB, mild edema. Feeling fatigued.  Took Metolazone x 3 doses then quit because of weight loss and edema resolved.  However, it has started to reappear.    Primary Cardiologist:Dr. Ruby Cola  HPI:  [name] is an 79y/o pt followed by our group with a history of CAD and ICM. He had CABG X 4 in '95. He had a pacemaker placed in 1999 after he had a syncopal spell while in a deer [place]. He had EOL Gen change in 2006. Cath in 2009 revealed patent grafts but severe LVD. He had a BiV ICD placed in 2009 by Dr Ladona Ridgel. Dr Royann Shivers notes he has PAF and is pacer dependent. He had a device change 12/13 by Dr Royann Shivers. We saw him in the office 05/21/13 when he presented with erosion of the skin around his device. He was admitted and ultimately had extraction of his device and leads by Dr Ladona Ridgel 05/27/13 with implantation of a temporary permanent transvenous system. He was discharged on 05/29/13 and re admitted 05/31/13 with acute CHF. His discharge wgt was 176. He was also found to have Lt arm DVT with occlusion of his Lt IJ and partial occlusion of the Lt [place] vein and axillary vein. He was placed on Coumadin. He also has permanent atrial fibrillation.   June 17, 2013 he returned to the hospital for implantation of cardiac resynchronization therapy pacemaker. He received a St. Jude USAA CRTP with model number 2088 right ventricular lead and model number [id number] left ventricular lead.   2 weeks ago he was seen for Coumadin check and complained of lower extremity edema and his diuretic Lasix  was increased from 40 to 80 mg daily. Despite this his edema continued. He denies shortness of breath no chest pain no other complaints he is able to sleep flat in bed. Metolazone was added to medical regimen he took 3 tabs in one week and per his records his wt dropped from 168.4 to  158.  He thought this was too much weight for him so instead or calling or decreasing metolazone as instructed, he stopped it compltely.  Now edema has returned.  No chest pain, no SOB.  He seems to be [facility].  Also with meds he tells me he is on 80 mg tab of lasix once daily.   No Known Allergies  Current Outpatient Prescriptions  Medication Sig Dispense Refill  . albuterol (PROVENTIL HFA;VENTOLIN HFA) 108 (90 BASE) MCG/ACT inhaler Inhale 2 puffs into the lungs every 6 (six) hours as needed for wheezing or shortness of breath.      . bimatoprost (LUMIGAN) 0.03 % ophthalmic solution Place 1 drop into both eyes at bedtime.       . budesonide-formoterol (SYMBICORT) 160-4.5 MCG/ACT inhaler Inhale 1 puff into the lungs 2 (two) times daily.      . captopril (CAPOTEN) 25 MG tablet Take 0.5 tablets (12.5 mg total) by mouth 2 (two) times daily.  90 tablet  2  . Coenzyme Q10 (COQ10) 100 MG CAPS Take 100 mg by mouth daily.       . COMBIGAN 0.2-0.5 % ophthalmic solution Place 1 drop into both eyes Twice daily.      . digoxin (LANOXIN) 0.125 MG tablet Take 1 tablet (125 mcg total) by mouth daily.  90 tablet  2  . dorzolamide (TRUSOPT) 2 % ophthalmic solution Place 1 drop into the left eye Twice daily.      . fish oil-omega-3 fatty acids 1000 MG capsule Take 1 g by mouth 3 (three) times daily.       . folic acid-pyridoxine-cyancobalamin (FOLTX) 2.5-25-2 MG TABS Take 1 tablet by mouth daily.  30 each  11  . furosemide (LASIX) 40 MG tablet Take 80 mg by mouth daily.      Marland Kitchen levothyroxine (SYNTHROID, LEVOTHROID) 125 MCG tablet Take 125 mcg by mouth daily before breakfast.      . magnesium oxide (MAG-OX) 400 MG tablet Take 400 mg by mouth daily.      . potassium chloride (KLOR-CON 10) 10 MEQ tablet Take 1 tablet (10 mEq total) by mouth 2 (two) times daily.      . vitamin E 400 UNIT capsule Take 400 Units by mouth daily.        Marland Kitchen warfarin (COUMADIN) 5 MG tablet Please take 5 mg and 2.5 mg alternating days      .  metolazone (ZAROXOLYN) 5 MG tablet Take 1 tablet (5 mg total) by mouth 3 (three) times a week.  15 tablet  2   No current facility-administered medications for this visit.    Past Medical History  Diagnosis Date  . Ischemic cardiomyopathy     EF-35-45%by echo in 2011; first dx in the 90's but again in 2009 & rec'd 1st BiV ICD   . Paroxysmal atrial fibrillation     sinus rhythm on amiodarone  . Asbestos exposure     plaques on CT  . Compression fracture     T7  . Hypertension   . Coronary artery disease     last cath 2009, hx CABG  . Automatic implantable cardioverter-defibrillator in situ     now explanted  . Hypothyroidism   . CHF (congestive heart failure)     hx.  . Myocardial infarction     INFERIOR [phone] wth PTCA; Ant MI 1995 wth PTCA   . Shortness of breath     "only when external pacemaker wasn't working right" (06/17/2013)  . Arthritis     "a touch all over" (06/17/2013)  . COPD (chronic obstructive pulmonary disease)     "a touch" (8/4//2014)  . Atrial fibrillation, permanent 07/22/2013  . Biventricular cardiac pacemaker in situ 06/2013    St. Jude Allura (pt no longer wished for ICD portion)    Past Surgical History  Procedure Laterality Date  . Inguinal hernia repair Right   . Coronary artery bypass graft  11/01/1994    SVG to first diagonal, to distal RCA, to the ramus intermedius artery, and LIMA the LAD  . 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 Cataract extraction w/ intraocular lens  implant, bilateral Bilateral   . Icd lead removal Left 05/27/2013    Procedure: ICD LEAD REMOVAL;  Surgeon: Marinus Maw, MD;  [facility];  Service: Cardiovascular;  Laterality: Left;  . Bi-ventricular pacemaker insertion (crt-p) Right 06/17/2013    St Jude  . Pacemaker removal  Left 05/2013  . Coronary angioplasty with stent placement  12/17/2001    to VG-diag wth Zeta stent  . Cardiac catheterization  2009    Patent  LIMA-LAD; patent VG-diag wth mild in-stent restenosis; patent VG-ramus intemedius; patent VG-RCA wth 70% mid post lat stenosis  . Cardiac catheterization  03/16/2006    patent LIMA, patent VGs;  EF 30%  . Cardioversion  10/12/2010    successful cardioversion  . Coronary angioplasty  [phone]    LAD;Inf MI PTCA-RCA; ant MI PTCA- LAD    WJX:BJYNWGN:FA colds or fevers, weight decreased and is now back up with lower ext edema. Skin:no rashes or ulcers HEENT:no blurred vision, no congestion CV:see HPI PUL:see HPI GI:no diarrhea constipation or melena, no indigestion GU:no hematuria, no dysuria MS:no joint pain, no claudication Neuro:no syncope, no lightheadedness Endo:no diabetes, + thyroid disease  PHYSICAL EXAM BP 124/74  Pulse 68  Ht 5\' 11"  (1.803 m)  Wt 170 lb 12.8 oz (77.474 kg)  BMI 23.83 kg/m2 General:Pleasant affect, NAD Skin:Warm and dry, brisk capillary refill HEENT:normocephalic, sclera clear, mucus membranes moist, hard of hearing  Neck:supple, no JVD, no bruits  Heart:Regular with soft murmur,no gallup, rub or click Lungs:clear without rales, rhonchi, or wheezes OZH:YQMV, non tender, + BS, do not palpate liver spleen or masses Ext:1+ lower ext edema, 2+ pedal pulses, 2+ radial pulses Neuro:alert and oriented, MAE, follows commands, + facial symmetry   ASSESSMENT AND PLAN Bilateral leg edema The metolazone he was placed on resolved his edema, but he felt he lost too much weight so he stopped metolazone instead of cutting back as instructed.  Now his wt is back up to previous visit.  He agrees to add metolazone once weekly to help with edema.  He will follow up with Dr. Royann Shivers in 2 weeks.    Long term (current) use of anticoagulants On coumadin with therapeutic INR today.    CAD- CABG X 4 '95, patent grafts '09 No chest  pain.  Arm DVT, acute IJ and SCV DVT after T-TVDP placed On coumadin  Atrial fibrillation, permanent Rate controlled  Hypokalemia K+ low last check, replaced but will recheck .

## 2013-08-06 NOTE — Assessment & Plan Note (Signed)
The metolazone he was placed on resolved his edema, but he felt he lost too much weight so he stopped metolazone instead of cutting back as instructed.  Now his wt is back up to previous visit.  He agrees to add metolazone once weekly to help with edema.  He will follow up with Dr. Royann Shivers in 2 weeks.

## 2013-08-06 NOTE — Assessment & Plan Note (Signed)
On coumadin with therapeutic INR today.

## 2013-08-06 NOTE — Assessment & Plan Note (Signed)
No chest pain

## 2013-08-06 NOTE — Assessment & Plan Note (Signed)
Rate controlled 

## 2013-08-06 NOTE — Patient Instructions (Signed)
Take the metolazone today and then every Tuesday 30 min prior to lasix.   Weigh daily Call [phone] if weight climbs more than 3 pounds in a day or 5 pounds in a week. No salt to very little salt in your diet.  No more than 2000 mg in a day. Call if increased shortness of breath or increased swelling.   follow up with Dr. Royann Shivers in 2 weeks.   Have blood work today.

## 2013-08-14 ENCOUNTER — Ambulatory Visit: Payer: Medicare Other | Admitting: Pharmacist Clinician (PhC)/ Clinical Pharmacy Specialist

## 2013-08-22 ENCOUNTER — Telehealth: Payer: Self-pay | Admitting: Cardiovascular Disease

## 2013-08-26 ENCOUNTER — Other Ambulatory Visit: Payer: Self-pay | Admitting: Cardiovascular Disease

## 2013-09-04 ENCOUNTER — Ambulatory Visit (INDEPENDENT_AMBULATORY_CARE_PROVIDER_SITE_OTHER): Payer: Medicare Other | Admitting: Pharmacist Clinician (PhC)/ Clinical Pharmacy Specialist

## 2013-09-04 ENCOUNTER — Ambulatory Visit (INDEPENDENT_AMBULATORY_CARE_PROVIDER_SITE_OTHER): Payer: Medicare Other | Admitting: Cardiovascular Disease

## 2013-09-04 ENCOUNTER — Encounter: Payer: Self-pay | Admitting: Cardiovascular Disease

## 2013-09-04 VITALS — BP 124/72 | HR 74 | Ht 71.0 in | Wt 171.8 lb

## 2013-09-04 DIAGNOSIS — Z950 Presence of cardiac pacemaker: Secondary | ICD-10-CM

## 2013-09-04 DIAGNOSIS — Z7901 Long term (current) use of anticoagulants: Secondary | ICD-10-CM

## 2013-09-04 DIAGNOSIS — Z95810 Presence of automatic (implantable) cardiac defibrillator: Secondary | ICD-10-CM

## 2013-09-04 DIAGNOSIS — I4891 Unspecified atrial fibrillation: Secondary | ICD-10-CM

## 2013-09-04 DIAGNOSIS — I5023 Acute on chronic systolic (congestive) heart failure: Secondary | ICD-10-CM

## 2013-09-04 LAB — POCT INR: INR: 2.2

## 2013-09-04 NOTE — Patient Instructions (Signed)
Your physician has recommended you make the following change in your medication: Increase metolazone 2.5 mg every 3 days a week (Mondays Wednesdays and Fridays) Please continue monitoring and recording her weight every day. This record is very helpful. Return for followup in one month.

## 2013-09-10 LAB — PACEMAKER DEVICE OBSERVATION
BATTERY VOLTAGE: 2.95 V
RV LEAD IMPEDENCE PM: 450 Ohm
VENTRICULAR PACING PM: 99

## 2013-09-15 DIAGNOSIS — Z950 Presence of cardiac pacemaker: Secondary | ICD-10-CM | POA: Insufficient documentation

## 2013-09-15 NOTE — Assessment & Plan Note (Signed)
He continues to have signs and symptoms of hypervolemia. Advised to increase the metolazone to 5 mg every Monday Wednesday and Friday. I think this dose might be good for long-term therapy for him. He appears to need higher doses of diuretics than he did before his CRT-D device was explanted. It is possible that he is not getting the same amount of resynchronization with the new location of the lead. It is also possible that there has been progression of his cardiomyopathy. Nevertheless I think we can return him to a good functional situation. I asked him to continue keeping the same detail record of his weights.

## 2013-09-15 NOTE — Progress Notes (Signed)
Patient ID: [name], male   DOB: 07-10-1926, 79 y.o.   MRN: [id number]      Reason for office visit Congestive heart failure followup  [name] is an 79 year old man with severe ischemic cardiomyopathy who presented with skin erosion at the site of his CRT-D device and that it removed in July. The device was reimplanted on the contralateral side in August. He had substantial problems with congestive heart failure in the interval between the 2 devices. He showed improvement following reinitiation of CRT and increased diuretic therapy, but has been plagued by occasional mild heart failure exacerbations. He was seen in office by Nada Boozer September 23. Metolazone was restarted once a week. He has had partial improvement in his edema but still has fatigue and shortness of breath. He had lost substantial fluid while taking the metolazone 3 days a week but was worried that he had lost too much fluid. As far as I can tell he was never hypotensive or presyncopal.  He had bypass surgery in 1995 and his grafts were found to be patent by coronary angiography in 2009. He does not have angina pectoris. He has permanent atrial fibrillation and complete heart block (pacemaker dependent). He developed deep venous thrombosis of the internal jugular and subclavian vein while he had his temporary transvenous system, he is still on warfarin therapy.  Today he actually weighs more than when he saw Vernona Rieger on September 23 (she has gained 1 pound) in early August his weight was around 162-166 pounds. His real weight has fluctuated, as his appetite was poor while he had heart failure. I'm no longer certain where his try which should be.  On his home scale, from which he provides a detailed record of weights over the last month, his weight has varied between a minimum of 161 pounds and a maximum of 168 pounds (where he actually is today). Cannot remember feeling particularly weak or dizzy when his weight was in the  low 160s. In August his weight had been as low as 158 pounds.   No Known Allergies  Current Outpatient Prescriptions  Medication Sig Dispense Refill  . albuterol (PROVENTIL HFA;VENTOLIN HFA) 108 (90 BASE) MCG/ACT inhaler Inhale 2 puffs into the lungs every 6 (six) hours as needed for wheezing or shortness of breath.      . bimatoprost (LUMIGAN) 0.03 % ophthalmic solution Place 1 drop into both eyes at bedtime.       . budesonide-formoterol (SYMBICORT) 160-4.5 MCG/ACT inhaler Inhale 1 puff into the lungs 2 (two) times daily.      . captopril (CAPOTEN) 25 MG tablet Take 0.5 tablets (12.5 mg total) by mouth 2 (two) times daily.  90 tablet  2  . Coenzyme Q10 (COQ10) 100 MG CAPS Take 100 mg by mouth daily.       . COMBIGAN 0.2-0.5 % ophthalmic solution Place 1 drop into both eyes Twice daily.      . digoxin (LANOXIN) 0.125 MG tablet Take 1 tablet (125 mcg total) by mouth daily.  90 tablet  2  . dorzolamide (TRUSOPT) 2 % ophthalmic solution Place 1 drop into the left eye Twice daily.      . fish oil-omega-3 fatty acids 1000 MG capsule Take 1 g by mouth 3 (three) times daily.       . folic acid-pyridoxine-cyancobalamin (FOLTX) 2.5-25-2 MG TABS Take 1 tablet by mouth daily.  30 each  11  . furosemide (LASIX) 40 MG tablet Take 80 mg by mouth daily.      Marland Kitchen  levothyroxine (SYNTHROID, LEVOTHROID) 125 MCG tablet Take 125 mcg by mouth daily before breakfast.      . magnesium oxide (MAG-OX) 400 MG tablet Take 400 mg by mouth daily.      . metolazone (ZAROXOLYN) 5 MG tablet Take 5 mg by mouth once a week. Take one tablet 30 minutes before Furosemide on Wednesdays only      . potassium chloride (KLOR-CON 10) 10 MEQ tablet Take 1 tablet (10 mEq total) by mouth 2 (two) times daily.      . vitamin E 400 UNIT capsule Take 400 Units by mouth daily.        Marland Kitchen warfarin (COUMADIN) 5 MG tablet TAKE 1 TABLET DAILY AS DIRECTED.  90 tablet  1   No current facility-administered medications for this visit.    Past  Medical History  Diagnosis Date  . Ischemic cardiomyopathy     EF-35-45%by echo in 2011; first dx in the 90's but again in 2009 & rec'd 1st BiV ICD   . Paroxysmal atrial fibrillation     sinus rhythm on amiodarone  . Asbestos exposure     plaques on CT  . Compression fracture     T7  . Hypertension   . Coronary artery disease     last cath 2009, hx CABG  . Automatic implantable cardioverter-defibrillator in situ     now explanted  . Hypothyroidism   . CHF (congestive heart failure)     hx.  . Myocardial infarction     INFERIOR [phone] wth PTCA; Ant MI 1995 wth PTCA   . Shortness of breath     "only when external pacemaker wasn't working right" (06/17/2013)  . Arthritis     "a touch all over" (06/17/2013)  . COPD (chronic obstructive pulmonary disease)     "a touch" (8/4//2014)  . Atrial fibrillation, permanent 07/22/2013  . Biventricular cardiac pacemaker in situ 06/2013    St. Jude Allura (pt no longer wished for ICD portion)    Past Surgical History  Procedure Laterality Date  . Inguinal hernia repair Right   . Coronary artery bypass graft  11/01/1994    SVG to first diagonal, to distal RCA, to the ramus intermedius artery, and LIMA the LAD  . 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 Cataract extraction w/ intraocular lens  implant, bilateral Bilateral   . Icd lead removal Left 05/27/2013    Procedure: ICD LEAD REMOVAL;  Surgeon: Marinus Maw, MD;  Location: [facility] OR;  Service: Cardiovascular;  Laterality: Left;  . Bi-ventricular pacemaker insertion (crt-p) Right 06/17/2013    St Jude  . Pacemaker removal Left 05/2013  . Coronary angioplasty with stent placement  12/17/2001    to VG-diag wth Zeta stent  . Cardiac catheterization  2009    Patent LIMA-LAD; patent VG-diag wth mild  in-stent restenosis; patent VG-ramus intemedius; patent VG-RCA wth 70% mid post lat stenosis  . Cardiac catheterization  03/16/2006    patent LIMA, patent VGs;  EF 30%  . Cardioversion  10/12/2010    successful cardioversion  . Coronary angioplasty  [phone]    LAD;Inf MI PTCA-RCA; ant MI PTCA- LAD    Family History  Problem Relation Age of Onset  . Heart disease Father     History  Social History  . Marital Status: Widowed    Spouse Name: N/A    Number of Children: N/A  . 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 Smoking status: Never Smoker   . Smokeless tobacco: Never Used  . Alcohol Use: 8.4 oz/week    14 Glasses of wine per week     Comment: 8/4//2014 "couple glasses of wine qd"  . Drug Use: No  . Sexual Activity: No   Other Topics Concern  . Not on file   Social History Narrative  . No narrative on file    Review of systems: The patient specifically denies any chest pain at rest exertion, dyspnea at rest (but minimal exertion leads to development of dyspnea), orthopnea, paroxysmal nocturnal dyspnea, syncope, palpitations, focal neurological deficits, intermittent claudication,unexplained weight gain, cough, hemoptysis or wheezing. He has mild lower extremity edema.   PHYSICAL EXAM BP 124/72  Pulse 74  Ht 5\' 11"  (1.803 m)  Wt 171 lb 12.8 oz (77.928 kg)  BMI 23.97 kg/m2  General: Alert, oriented x3, no distress Head: no evidence of trauma, PERRL, EOMI, no exophtalmos or lid lag, no myxedema, no xanthelasma; normal ears, nose and oropharynx Neck: 5-6 cm elevation in jugular venous pulsations and mild hepatojugular reflux; brisk carotid pulses without delay and no carotid bruits Chest: clear to auscultation, no signs of consolidation by percussion or palpation, normal fremitus, symmetrical and full respiratory excursions; both subclavian scars appear well-healed with the  device down the right side Cardiovascular: normal position and quality of the apical impulse, regular rhythm, normal first and second heart sounds, no murmurs, rubs or gallops Abdomen: no tenderness or distention, no masses by palpation, no abnormal pulsatility or arterial bruits, normal bowel sounds, no hepatosplenomegaly Extremities: no clubbing, cyanosis; trace ankle edema; 2+ radial, ulnar and brachial pulses bilaterally; 2+ right femoral, posterior tibial and dorsalis pedis pulses; 2+ left femoral, posterior tibial and dorsalis pedis pulses; no subclavian or femoral bruits Neurological: grossly nonfocal   Lipid Panel     Component Value Date/Time   CHOL 152 12/13/2012 0950   TRIG 159* 12/13/2012 0950   HDL 28* 12/13/2012 0950   CHOLHDL 5.4 12/13/2012 0950   VLDL 32 12/13/2012 0950   LDLCALC 92 12/13/2012 0950    BMET    Component Value Date/Time   NA 135 08/06/2013 1220   K 4.5 08/06/2013 1220   CL 98 08/06/2013 1220   CO2 31 08/06/2013 1220   GLUCOSE 97 08/06/2013 1220   BUN 29* 08/06/2013 1220   CREATININE 0.99 08/06/2013 1220   CREATININE 1.32 06/17/2013 0919   CREATININE 1.19 06/19/2012 1119   CALCIUM 9.5 08/06/2013 1220   GFRNONAA 47* 06/17/2013 0919   GFRAA 55* 06/17/2013 0919     ASSESSMENT AND PLAN Acute on chronic systolic heart failure He continues to have signs and symptoms of hypervolemia. Advised to increase the metolazone to 5 mg every Monday Wednesday and Friday. I think this dose might be good for long-term therapy for him. He appears to need higher doses of diuretics than he did before his CRT-D device was explanted. It is possible that he is not getting the same amount of resynchronization with the new location of the lead. It is also possible that there has been progression of his cardiomyopathy. Nevertheless I think we can return him to a good functional situation. I asked him to continue keeping the same detail  record of his weights.  Biventricular cardiac pacemaker in  situ CRT-P device check in clinic. Normal device function. Thresholds, sensing, impedance consistent with previous measurements. Histograms appropriate for patient and level of activity. Permanent AF + warfarin. No ventricular high rate episodes. Patient bi-ventricularly pacing >99% of the time. Device heart failure diagnostics were unstable for patient.Estimated longevity >3 years. Patient will follow up with MD on 10-08-2013.    Orders Placed This Encounter  Procedures  . Pacemaker Device Observation   Meds ordered this encounter  Medications  . metolazone (ZAROXOLYN) 5 MG tablet    Sig: Take 5 mg by mouth once a week. Take one tablet 30 minutes before Furosemide on Wednesdays only    Kallie Depolo  Thurmon Fair, MD, [facility] [phone] office [phone] pager

## 2013-09-15 NOTE — Assessment & Plan Note (Signed)
CRT-P device check in clinic. Normal device function. Thresholds, sensing, impedance consistent with previous measurements. Histograms appropriate for patient and level of activity. Permanent AF + warfarin. No ventricular high rate episodes. Patient bi-ventricularly pacing >99% of the time. Device heart failure diagnostics were unstable for patient.Estimated longevity >3 years. Patient will follow up with MD on 10-08-2013.

## 2013-09-19 ENCOUNTER — Other Ambulatory Visit: Payer: Self-pay

## 2013-09-27 ENCOUNTER — Encounter: Payer: Medicare Other | Admitting: Internal Medicine

## 2013-10-02 ENCOUNTER — Ambulatory Visit: Payer: Medicare Other | Admitting: Pharmacist Clinician (PhC)/ Clinical Pharmacy Specialist

## 2013-10-08 ENCOUNTER — Ambulatory Visit (INDEPENDENT_AMBULATORY_CARE_PROVIDER_SITE_OTHER): Payer: Medicare Other | Admitting: Cardiovascular Disease

## 2013-10-08 ENCOUNTER — Other Ambulatory Visit: Payer: Self-pay | Admitting: Cardiovascular Disease

## 2013-10-08 ENCOUNTER — Encounter: Payer: Self-pay | Admitting: Cardiovascular Disease

## 2013-10-08 ENCOUNTER — Ambulatory Visit (INDEPENDENT_AMBULATORY_CARE_PROVIDER_SITE_OTHER): Payer: Medicare Other | Admitting: Pharmacist Clinician (PhC)/ Clinical Pharmacy Specialist

## 2013-10-08 VITALS — BP 114/68 | HR 70 | Ht 71.0 in | Wt 173.0 lb

## 2013-10-08 DIAGNOSIS — Z950 Presence of cardiac pacemaker: Secondary | ICD-10-CM

## 2013-10-08 DIAGNOSIS — I2510 Atherosclerotic heart disease of native coronary artery without angina pectoris: Secondary | ICD-10-CM

## 2013-10-08 DIAGNOSIS — I4821 Permanent atrial fibrillation: Secondary | ICD-10-CM

## 2013-10-08 DIAGNOSIS — I509 Heart failure, unspecified: Secondary | ICD-10-CM

## 2013-10-08 DIAGNOSIS — R5381 Other malaise: Secondary | ICD-10-CM

## 2013-10-08 DIAGNOSIS — I5042 Chronic combined systolic (congestive) and diastolic (congestive) heart failure: Secondary | ICD-10-CM

## 2013-10-08 DIAGNOSIS — I4891 Unspecified atrial fibrillation: Secondary | ICD-10-CM

## 2013-10-08 DIAGNOSIS — D510 Vitamin B12 deficiency anemia due to intrinsic factor deficiency: Secondary | ICD-10-CM

## 2013-10-08 DIAGNOSIS — Z7901 Long term (current) use of anticoagulants: Secondary | ICD-10-CM

## 2013-10-08 DIAGNOSIS — Z79899 Other long term (current) drug therapy: Secondary | ICD-10-CM

## 2013-10-08 LAB — PACEMAKER DEVICE OBSERVATION

## 2013-10-08 LAB — MDC_IDC_ENUM_SESS_TYPE_INCLINIC
Brady Statistic RV Percent Paced: 97 %
Implantable Pulse Generator Serial Number: 7515957
Lead Channel Impedance Value: 380 Ohm
Lead Channel Impedance Value: 490 Ohm
Lead Channel Pacing Threshold Amplitude: 0.75 V
Lead Channel Pacing Threshold Pulse Width: 0.6 ms
Lead Channel Setting Pacing Amplitude: 2.5 V
Lead Channel Setting Pacing Amplitude: 2.5 V
Lead Channel Setting Pacing Pulse Width: 0.8 ms

## 2013-10-08 LAB — BASIC METABOLIC PANEL
CO2: 34 mEq/L — ABNORMAL HIGH (ref 19–32)
Glucose, Bld: 130 mg/dL — ABNORMAL HIGH (ref 70–99)
Potassium: 3.9 mEq/L (ref 3.5–5.3)
Sodium: 138 mEq/L (ref 135–145)

## 2013-10-08 LAB — CBC
HCT: 34.4 % — ABNORMAL LOW (ref 39.0–52.0)
Hemoglobin: 11.7 g/dL — ABNORMAL LOW (ref 13.0–17.0)
RBC: 3.98 MIL/uL — ABNORMAL LOW (ref 4.22–5.81)
WBC: 5.1 10*3/uL (ref 4.0–10.5)

## 2013-10-08 LAB — TSH: TSH: 1.443 u[IU]/mL (ref 0.350–4.500)

## 2013-10-08 LAB — IRON AND TIBC
%SAT: 16 % — ABNORMAL LOW (ref 20–55)
TIBC: 426 ug/dL (ref 215–435)

## 2013-10-08 NOTE — Patient Instructions (Signed)
Your physician recommends that you have lab work done today.  Your physician recommends that you schedule a follow-up appointment in: 3 months.

## 2013-10-13 NOTE — Assessment & Plan Note (Signed)
Appears reasonably well compensated. Expect he will see some small additional improvement as he recovers from deconditioning. Restrictive lung disease from asbestosis contributes to his dyspnea.

## 2013-10-13 NOTE — Assessment & Plan Note (Signed)
On warfarin without embolic or bleeding problems

## 2013-10-13 NOTE — Progress Notes (Signed)
Patient ID: [name], male   DOB: 11-25-1925, 79 y.o.   MRN: [id number]       Reason for office visit CHF, CRT-P followup, CAD s/p CABG, permanent AFib, CHB  [name] seems to have stabilized following reimplantation of CRT-D system after pocket erosion (explant July 14, reimplant CRT-P August 4). The transitional period was complicated by DVT of jugular and subclavian veins at site of temporary transvenous pacer and CHF exacerbation. He now generally feels well, but does not have the energy and stamina that he had prior to these events. He actually has gained weight since his last visit without evidence of worsening HF. He seems to be regaining some of the weight he lost during the acute illness (was 175 lb or so before the ICD pocket erosion). His "dry weight" has been a bit of a moving target.   No Known Allergies  Current Outpatient Prescriptions  Medication Sig Dispense Refill  . albuterol (PROVENTIL HFA;VENTOLIN HFA) 108 (90 BASE) MCG/ACT inhaler Inhale 2 puffs into the lungs every 6 (six) hours as needed for wheezing or shortness of breath.      . bimatoprost (LUMIGAN) 0.03 % ophthalmic solution Place 1 drop into both eyes at bedtime.       . budesonide-formoterol (SYMBICORT) 160-4.5 MCG/ACT inhaler Inhale 1 puff into the lungs 2 (two) times daily.      . captopril (CAPOTEN) 25 MG tablet Take 0.5 tablets (12.5 mg total) by mouth 2 (two) times daily.  90 tablet  2  . Coenzyme Q10 (COQ10) 100 MG CAPS Take 100 mg by mouth daily.       . COMBIGAN 0.2-0.5 % ophthalmic solution Place 1 drop into both eyes Twice daily.      . digoxin (LANOXIN) 0.125 MG tablet Take 1 tablet (125 mcg total) by mouth daily.  90 tablet  2  . dorzolamide (TRUSOPT) 2 % ophthalmic solution Place 1 drop into the left eye Twice daily.      . fish oil-omega-3 fatty acids 1000 MG capsule Take 1 g by mouth 3 (three) times daily.       . folic acid-pyridoxine-cyancobalamin (FOLTX) 2.5-25-2 MG TABS Take 1 tablet  by mouth daily.  30 each  11  . furosemide (LASIX) 40 MG tablet Take 80 mg by mouth daily.      Marland Kitchen levothyroxine (SYNTHROID, LEVOTHROID) 125 MCG tablet Take 125 mcg by mouth daily before breakfast.      . magnesium oxide (MAG-OX) 400 MG tablet Take 400 mg by mouth daily.      . metolazone (ZAROXOLYN) 5 MG tablet Take 5 mg by mouth 3 (three) times a week. Take one tablet 30 minutes before Furosemide on Monday/Wednesday/Friday      . potassium chloride (KLOR-CON 10) 10 MEQ tablet Take 1 tablet (10 mEq total) by mouth 2 (two) times daily.      . vitamin E 400 UNIT capsule Take 400 Units by mouth daily.        Marland Kitchen warfarin (COUMADIN) 5 MG tablet TAKE 1 TABLET DAILY AS DIRECTED.  90 tablet  1   No current facility-administered medications for this visit.    Past Medical History  Diagnosis Date  . Ischemic cardiomyopathy     EF-35-45%by echo in 2011; first dx in the 90's but again in 2009 & rec'd 1st BiV ICD   . Paroxysmal atrial fibrillation     sinus rhythm on amiodarone  . Asbestos exposure     plaques on CT  .  Compression fracture     T7  . Hypertension   . Coronary artery disease     last cath 2009, hx CABG  . Automatic implantable cardioverter-defibrillator in situ     now explanted  . Hypothyroidism   . CHF (congestive heart failure)     hx.  . Myocardial infarction     INFERIOR [phone] wth PTCA; Ant MI 1995 wth PTCA   . Shortness of breath     "only when external pacemaker wasn't working right" (06/17/2013)  . Arthritis     "a touch all over" (06/17/2013)  . COPD (chronic obstructive pulmonary disease)     "a touch" (8/4//2014)  . Atrial fibrillation, permanent 07/22/2013  . Biventricular cardiac pacemaker in situ 06/2013    St. Jude Allura (pt no longer wished for ICD portion)    Past Surgical History  Procedure Laterality Date  . Inguinal hernia repair Right   . Coronary artery bypass graft  11/01/1994    SVG to first diagonal, to distal RCA, to the ramus intermedius artery,  and LIMA the LAD  . 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 Cataract extraction w/ intraocular lens  implant, bilateral Bilateral   . Icd lead removal Left 05/27/2013    Procedure: ICD LEAD REMOVAL;  Surgeon: Marinus Maw, MD;  Location: [facility] OR;  Service: Cardiovascular;  Laterality: Left;  . Bi-ventricular pacemaker insertion (crt-p) Right 06/17/2013    St Jude  . Pacemaker removal Left 05/2013  . Coronary angioplasty with stent placement  12/17/2001    to VG-diag wth Zeta stent  . Cardiac catheterization  2009    Patent LIMA-LAD; patent VG-diag wth mild in-stent restenosis; patent VG-ramus intemedius; patent VG-RCA wth 70% mid post lat stenosis  . Cardiac catheterization  03/16/2006    patent LIMA, patent VGs;  EF 30%  . Cardioversion  10/12/2010    successful cardioversion  . Coronary angioplasty  [phone]    LAD;Inf MI PTCA-RCA; ant MI PTCA- LAD    Family History  Problem Relation Age of Onset  . Heart disease Father     History   Social History  . Marital Status: Widowed    Spouse Name: N/A    Number of Children: N/A  . 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 Smoking status: Never Smoker   . Smokeless tobacco: Never Used  . Alcohol Use: 8.4 oz/week    14 Glasses of wine per week     Comment: 8/4//2014 "couple glasses of wine qd"  . Drug Use: No  . Sexual Activity: No   Other Topics Concern  . Not on file   Social History Narrative  . No narrative on file    Review of systems: NYHA class II The patient specifically denies any chest pain at rest or with exertion, dyspnea at rest or with exertion, orthopnea, paroxysmal nocturnal dyspnea, syncope, palpitations, focal neurological  deficits, intermittent claudication, lower extremity edema, unexplained weight gain, cough, hemoptysis or wheezing.  The patient also denies abdominal pain, nausea, vomiting, dysphagia, diarrhea, constipation, polyuria, polydipsia, dysuria, hematuria, frequency, urgency, abnormal bleeding or bruising, fever, chills, unexpected weight changes, mood swings, change in skin or hair  texture, change in voice quality, auditory or visual problems, allergic reactions or rashes, new musculoskeletal complaints other than usual "aches and pains".   PHYSICAL EXAM BP 114/68  Pulse 70  Ht 5\' 11"  (1.803 m)  Wt 173 lb (78.472 kg)  BMI 24.14 kg/m2 General: Alert, oriented x3, no distress  Head: no evidence of trauma, PERRL, EOMI, no exophtalmos or lid lag, no myxedema, no xanthelasma; normal ears, nose and oropharynx  Neck: 5-6 cm elevation in jugular venous pulsations and mild hepatojugular reflux; brisk carotid pulses without delay and no carotid bruits  Chest: clear to auscultation, no signs of consolidation by percussion or palpation, normal fremitus, symmetrical and full respiratory excursions; both subclavian scars appear well-healed with the device down the right side  Cardiovascular: normal position and quality of the apical impulse, regular rhythm, normal first and second heart sounds, no murmurs, rubs or gallops  Abdomen: no tenderness or distention, no masses by palpation, no abnormal pulsatility or arterial bruits, normal bowel sounds, no hepatosplenomegaly  Extremities: no clubbing, cyanosis; trace ankle edema; 2+ radial, ulnar and brachial pulses bilaterally; 2+ right femoral, posterior tibial and dorsalis pedis pulses; 2+ left femoral, posterior tibial and dorsalis pedis pulses; no subclavian or femoral bruits  Neurological: grossly nonfocal   EKG: ApBivP  Lipid Panel     Component Value Date/Time   CHOL 152 12/13/2012 0950   TRIG 159* 12/13/2012 0950   HDL 28* 12/13/2012 0950   CHOLHDL 5.4  12/13/2012 0950   VLDL 32 12/13/2012 0950   LDLCALC 92 12/13/2012 0950    BMET    Component Value Date/Time   NA 138 10/08/2013 1217   K 3.9 10/08/2013 1217   CL 93* 10/08/2013 1217   CO2 34* 10/08/2013 1217   GLUCOSE 130* 10/08/2013 1217   BUN 29* 10/08/2013 1217   CREATININE 1.20 10/08/2013 1217   CREATININE 1.32 06/17/2013 0919   CREATININE 1.19 06/19/2012 1119   CALCIUM 9.8 10/08/2013 1217   GFRNONAA 47* 06/17/2013 0919   GFRAA 55* 06/17/2013 0919     ASSESSMENT AND PLAN Biventricular cardiac pacemaker in situ Normal device function by recent check 10/22. Optivol now can be probably used. 100% BiV efficiency -pacemaker dependent.  CAD- CABG X 4 '95, patent grafts '09 Asymptomatic. Patent grafts at last cath.  Chronic combined systolic and diastolic congestive heart failure, NYHA class 2 Appears reasonably well compensated. Expect he will see some small additional improvement as he recovers from deconditioning. Restrictive lung disease from asbestosis contributes to his dyspnea.  Atrial fibrillation, permanent On warfarin without embolic or bleeding problems   Patient Instructions  Your physician recommends that you have lab work done today.  Your physician recommends that you schedule a follow-up appointment in: 3 months.      Orders Placed This Encounter  Procedures  . CBC  . IBC panel  . TSH  . Basic Metabolic Panel (BMET)  . EKG 12-Lead   Aydrien Froman  Thurmon Fair, MD, [facility] [phone] office [phone] pager

## 2013-10-13 NOTE — Assessment & Plan Note (Addendum)
Normal device function by recent check 10/22. Optivol now can be probably used. 100% BiV efficiency -pacemaker dependent.

## 2013-10-13 NOTE — Assessment & Plan Note (Signed)
Asymptomatic. Patent grafts at last cath.

## 2013-10-14 ENCOUNTER — Encounter: Payer: Self-pay | Admitting: Internal Medicine

## 2013-10-14 ENCOUNTER — Ambulatory Visit (INDEPENDENT_AMBULATORY_CARE_PROVIDER_SITE_OTHER): Payer: Medicare Other | Admitting: Internal Medicine

## 2013-10-14 VITALS — BP 96/59 | HR 54 | Ht 71.0 in | Wt 171.8 lb

## 2013-10-14 DIAGNOSIS — I4891 Unspecified atrial fibrillation: Secondary | ICD-10-CM

## 2013-10-14 DIAGNOSIS — Z950 Presence of cardiac pacemaker: Secondary | ICD-10-CM

## 2013-10-14 DIAGNOSIS — T889XXS Complication of surgical and medical care, unspecified, sequela: Secondary | ICD-10-CM

## 2013-10-14 DIAGNOSIS — Z95810 Presence of automatic (implantable) cardiac defibrillator: Secondary | ICD-10-CM

## 2013-10-14 DIAGNOSIS — I509 Heart failure, unspecified: Secondary | ICD-10-CM

## 2013-10-14 DIAGNOSIS — I5042 Chronic combined systolic (congestive) and diastolic (congestive) heart failure: Secondary | ICD-10-CM

## 2013-10-14 DIAGNOSIS — Z7901 Long term (current) use of anticoagulants: Secondary | ICD-10-CM

## 2013-10-14 DIAGNOSIS — T827XXS Infection and inflammatory reaction due to other cardiac and vascular devices, implants and grafts, sequela: Secondary | ICD-10-CM

## 2013-10-14 DIAGNOSIS — I5023 Acute on chronic systolic (congestive) heart failure: Secondary | ICD-10-CM

## 2013-10-14 DIAGNOSIS — I4821 Permanent atrial fibrillation: Secondary | ICD-10-CM

## 2013-10-14 LAB — MDC_IDC_ENUM_SESS_TYPE_INCLINIC
Battery Remaining Longevity: 61.2 mo
Battery Voltage: 2.96 V
Brady Statistic RV Percent Paced: 99 %
Implantable Pulse Generator Model: 3242
Lead Channel Impedance Value: 412.5 Ohm
Lead Channel Impedance Value: 512.5 Ohm
Lead Channel Pacing Threshold Amplitude: 1 V
Lead Channel Pacing Threshold Amplitude: 1.75 V
Lead Channel Setting Pacing Amplitude: 2.5 V
Lead Channel Setting Pacing Amplitude: 2.5 V

## 2013-10-14 NOTE — Assessment & Plan Note (Signed)
His St. Jude biventricular pacemaker has been reprogrammed to optimize battery longevity.

## 2013-10-14 NOTE — Progress Notes (Signed)
HPI [name] returns today for followup. He is a pleasant 79 yo man with a h/o an ICM, chronic systolic CHF, HTN, CHB, s/p BiV ICD implant who developed a pocket infection and underwent extraction and re-insertion of a new device. He denies syncope, fevers or chills. He notes that his energy level is not as good as it was prior to his surgery. He admits to a fairly sedentary lifestyle. He denies chest pain, syncope, or peripheral edema. No Known Allergies   Current Outpatient Prescriptions  Medication Sig Dispense Refill  . albuterol (PROVENTIL HFA;VENTOLIN HFA) 108 (90 BASE) MCG/ACT inhaler Inhale 2 puffs into the lungs every 6 (six) hours as needed for wheezing or shortness of breath.      . bimatoprost (LUMIGAN) 0.03 % ophthalmic solution Place 1 drop into both eyes at bedtime.       . budesonide-formoterol (SYMBICORT) 160-4.5 MCG/ACT inhaler Inhale 1 puff into the lungs 2 (two) times daily.      . captopril (CAPOTEN) 25 MG tablet Take 0.5 tablets (12.5 mg total) by mouth 2 (two) times daily.  90 tablet  2  . Coenzyme Q10 (COQ10) 100 MG CAPS Take 100 mg by mouth daily.       . COMBIGAN 0.2-0.5 % ophthalmic solution Place 1 drop into both eyes Twice daily.      . digoxin (LANOXIN) 0.125 MG tablet Take 1 tablet (125 mcg total) by mouth daily.  90 tablet  2  . dorzolamide (TRUSOPT) 2 % ophthalmic solution Place 1 drop into the left eye Twice daily.      . fish oil-omega-3 fatty acids 1000 MG capsule Take 1 g by mouth 3 (three) times daily.       . folic acid-pyridoxine-cyancobalamin (FOLTX) 2.5-25-2 MG TABS Take 1 tablet by mouth daily.  30 each  11  . furosemide (LASIX) 40 MG tablet Take 80 mg by mouth daily.      Marland Kitchen levothyroxine (SYNTHROID, LEVOTHROID) 125 MCG tablet Take 125 mcg by mouth daily before breakfast.      . magnesium oxide (MAG-OX) 400 MG tablet Take 400 mg by mouth daily.      . metolazone (ZAROXOLYN) 5 MG tablet Take 5 mg by mouth 3 (three) times a week. Take one tablet 30  minutes before Furosemide on Monday/Wednesday/Friday      . potassium chloride (KLOR-CON 10) 10 MEQ tablet Take 1 tablet (10 mEq total) by mouth 2 (two) times daily.      . vitamin E 400 UNIT capsule Take 400 Units by mouth daily.        Marland Kitchen warfarin (COUMADIN) 5 MG tablet TAKE 1 TABLET DAILY AS DIRECTED.  90 tablet  1   No current facility-administered medications for this visit.     Past Medical History  Diagnosis Date  . Ischemic cardiomyopathy     EF-35-45%by echo in 2011; first dx in the 90's but again in 2009 & rec'd 1st BiV ICD   . Paroxysmal atrial fibrillation     sinus rhythm on amiodarone  . Asbestos exposure     plaques on CT  . Compression fracture     T7  . Hypertension   . Coronary artery disease     last cath 2009, hx CABG  . Automatic implantable cardioverter-defibrillator in situ     now explanted  . Hypothyroidism   . CHF (congestive heart failure)     hx.  . Myocardial infarction     INFERIOR [phone] wth PTCA; Ant  MI 1995 wth PTCA   . Shortness of breath     "only when external pacemaker wasn't working right" (06/17/2013)  . Arthritis     "a touch all over" (06/17/2013)  . COPD (chronic obstructive pulmonary disease)     "a touch" (8/4//2014)  . Atrial fibrillation, permanent 07/22/2013  . Biventricular cardiac pacemaker in situ 06/2013    St. Jude Allura (pt no longer wished for ICD portion)    ROS:   All systems reviewed and negative except as noted in the HPI.   Past Surgical History  Procedure Laterality Date  . Inguinal hernia repair Right   . Coronary artery bypass graft  11/01/1994    SVG to first diagonal, to distal RCA, to the ramus intermedius artery, and LIMA the LAD  . 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 Cataract extraction w/ intraocular  lens  implant, bilateral Bilateral   . Icd lead removal Left 05/27/2013    Procedure: ICD LEAD REMOVAL;  Surgeon: Marinus Maw, MD;  Location: [facility] OR;  Service: Cardiovascular;  Laterality: Left;  . Bi-ventricular pacemaker insertion (crt-p) Right 06/17/2013    St Jude  . Pacemaker removal Left 05/2013  . Coronary angioplasty with stent placement  12/17/2001    to VG-diag wth Zeta stent  . Cardiac catheterization  2009    Patent LIMA-LAD; patent VG-diag wth mild in-stent restenosis; patent VG-ramus intemedius; patent VG-RCA wth 70% mid post lat stenosis  . Cardiac catheterization  03/16/2006    patent LIMA, patent VGs;  EF 30%  . Cardioversion  10/12/2010    successful cardioversion  . Coronary angioplasty  [phone]    LAD;Inf MI PTCA-RCA; ant MI PTCA- LAD     Family History  Problem Relation Age of Onset  . Heart disease Father      History   Social History  . Marital Status: Widowed    Spouse Name: N/A    Number of Children: N/A  . 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 Smoking status: Never Smoker   . Smokeless tobacco: Never Used  . Alcohol Use: 8.4 oz/week    14 Glasses of wine per week     Comment: 8/4//2014 "couple glasses of wine qd"  . Drug Use: No  . Sexual Activity: No   Other Topics Concern  . Not on file   Social History Narrative  . No narrative on file     BP 96/59  Pulse 54  Ht 5\' 11"  (1.803 m)  Wt 171 lb 12.8 oz (77.928 kg)  BMI 23.97 kg/m2  Physical Exam:  Well appearing elderly man, NAD HEENT: Unremarkable Neck:  7 cm JVD, no thyromegally Back:  No CVA tenderness Lungs:  Clear except for rare basilar rales. No wheezes or rhonchi. His pacemaker incision is well-healed. HEART:  Regular rate rhythm, no murmurs, no rubs, no clicks Abd:  soft, positive bowel sounds, no organomegally, no rebound, no guarding Ext:  2 plus pulses, no edema, no cyanosis, no  clubbing Skin:  No rashes no nodules Neuro:  CN II through XII intact, motor grossly intact   DEVICE  Normal device function.  See PaceArt for details.  Assess/Plan:

## 2013-10-14 NOTE — Patient Instructions (Signed)
Your physician recommends that you continue on your current medications as directed. Please refer to the Current Medication list given to you today.  Your physician wants you to follow-up in: August of 2015 with Dr. Ladona Ridgel.  You will receive a reminder letter in the mail two months in advance. If you don't receive a letter, please call our office to schedule the follow-up appointment.

## 2013-10-14 NOTE — Assessment & Plan Note (Signed)
His ventricular rate is well controlled. No change in medical therapy. 

## 2013-10-14 NOTE — Assessment & Plan Note (Signed)
His heart failure symptoms are class II. We discussed increased physical activity, a low-sodium diet, and continued medical therapy.

## 2013-10-26 ENCOUNTER — Other Ambulatory Visit: Payer: Self-pay | Admitting: Cardiology

## 2013-10-28 NOTE — Telephone Encounter (Signed)
Rx was sent to pharmacy electronically. 

## 2013-11-04 ENCOUNTER — Other Ambulatory Visit: Payer: Self-pay | Admitting: *Deleted

## 2013-11-04 DIAGNOSIS — I5023 Acute on chronic systolic (congestive) heart failure: Secondary | ICD-10-CM

## 2013-11-04 MED ORDER — POTASSIUM CHLORIDE ER 10 MEQ PO TBCR: 10.0000 meq | EXTENDED_RELEASE_TABLET | Freq: Two times a day (BID) | ORAL | Status: DC

## 2013-11-04 NOTE — Telephone Encounter (Signed)
Rx was sent to pharmacy electronically. 

## 2013-11-11 ENCOUNTER — Ambulatory Visit (INDEPENDENT_AMBULATORY_CARE_PROVIDER_SITE_OTHER): Payer: Medicare Other

## 2013-11-11 DIAGNOSIS — I4891 Unspecified atrial fibrillation: Secondary | ICD-10-CM

## 2013-11-11 DIAGNOSIS — I4821 Permanent atrial fibrillation: Secondary | ICD-10-CM

## 2013-11-11 DIAGNOSIS — I5042 Chronic combined systolic (congestive) and diastolic (congestive) heart failure: Secondary | ICD-10-CM

## 2013-11-11 DIAGNOSIS — I509 Heart failure, unspecified: Secondary | ICD-10-CM

## 2013-11-11 LAB — PACEMAKER DEVICE OBSERVATION

## 2013-11-12 ENCOUNTER — Encounter: Payer: Self-pay | Admitting: *Deleted

## 2013-11-12 LAB — MDC_IDC_ENUM_SESS_TYPE_REMOTE
Battery Voltage: 2.98 V
Implantable Pulse Generator Serial Number: 7515957
Lead Channel Impedance Value: 440 Ohm
Lead Channel Impedance Value: 510 Ohm
Lead Channel Setting Pacing Amplitude: 2.5 V
Lead Channel Setting Pacing Pulse Width: 0.8 ms
Lead Channel Setting Sensing Sensitivity: 6 mV

## 2013-11-22 ENCOUNTER — Other Ambulatory Visit: Payer: Self-pay | Admitting: Internal Medicine

## 2013-12-05 ENCOUNTER — Other Ambulatory Visit: Payer: Self-pay | Admitting: *Deleted

## 2013-12-05 MED ORDER — FUROSEMIDE 80 MG PO TABS: 80.0000 mg | ORAL_TABLET | Freq: Every day | ORAL | Status: DC

## 2013-12-11 ENCOUNTER — Other Ambulatory Visit: Payer: Self-pay | Admitting: Internal Medicine

## 2013-12-12 ENCOUNTER — Ambulatory Visit (INDEPENDENT_AMBULATORY_CARE_PROVIDER_SITE_OTHER): Payer: Medicare Other | Admitting: *Deleted

## 2013-12-12 DIAGNOSIS — I4891 Unspecified atrial fibrillation: Secondary | ICD-10-CM

## 2013-12-12 DIAGNOSIS — I4821 Permanent atrial fibrillation: Secondary | ICD-10-CM

## 2013-12-12 LAB — MDC_IDC_ENUM_SESS_TYPE_REMOTE
Battery Remaining Longevity: 72 mo
Battery Voltage: 2.98 V
Date Time Interrogation Session: 20150129091647
Implantable Pulse Generator Model: 3242
Implantable Pulse Generator Serial Number: 7515957
Lead Channel Impedance Value: 390 Ohm
Lead Channel Pacing Threshold Amplitude: 1.75 V
Lead Channel Pacing Threshold Pulse Width: 0.8 ms
Lead Channel Sensing Intrinsic Amplitude: 7 mV
Lead Channel Setting Pacing Amplitude: 2.5 V
Lead Channel Setting Pacing Pulse Width: 0.4 ms
Lead Channel Setting Pacing Pulse Width: 0.8 ms
Lead Channel Setting Sensing Sensitivity: 6 mV
MDC IDC MSMT LEADCHNL RV IMPEDANCE VALUE: 450 Ohm
MDC IDC MSMT LEADCHNL RV PACING THRESHOLD AMPLITUDE: 1 V
MDC IDC MSMT LEADCHNL RV PACING THRESHOLD PULSEWIDTH: 0.4 ms
MDC IDC SET LEADCHNL LV PACING AMPLITUDE: 2.5 V

## 2013-12-12 LAB — PACEMAKER DEVICE OBSERVATION

## 2013-12-19 ENCOUNTER — Encounter: Payer: Self-pay | Admitting: *Deleted

## 2013-12-25 ENCOUNTER — Telehealth: Payer: Self-pay | Admitting: *Deleted

## 2013-12-25 ENCOUNTER — Ambulatory Visit (INDEPENDENT_AMBULATORY_CARE_PROVIDER_SITE_OTHER): Payer: Medicare Other | Admitting: Pharmacist Clinician (PhC)/ Clinical Pharmacy Specialist

## 2013-12-25 ENCOUNTER — Ambulatory Visit (INDEPENDENT_AMBULATORY_CARE_PROVIDER_SITE_OTHER): Payer: Medicare Other | Admitting: Cardiovascular Disease

## 2013-12-25 ENCOUNTER — Encounter: Payer: Self-pay | Admitting: Cardiovascular Disease

## 2013-12-25 VITALS — BP 90/60 | HR 70 | Resp 24 | Ht 71.0 in | Wt 175.6 lb

## 2013-12-25 DIAGNOSIS — I4821 Permanent atrial fibrillation: Secondary | ICD-10-CM

## 2013-12-25 DIAGNOSIS — I5042 Chronic combined systolic (congestive) and diastolic (congestive) heart failure: Secondary | ICD-10-CM

## 2013-12-25 DIAGNOSIS — I5023 Acute on chronic systolic (congestive) heart failure: Secondary | ICD-10-CM

## 2013-12-25 DIAGNOSIS — I4891 Unspecified atrial fibrillation: Secondary | ICD-10-CM

## 2013-12-25 DIAGNOSIS — R5383 Other fatigue: Secondary | ICD-10-CM

## 2013-12-25 DIAGNOSIS — I509 Heart failure, unspecified: Secondary | ICD-10-CM

## 2013-12-25 DIAGNOSIS — I442 Atrioventricular block, complete: Secondary | ICD-10-CM

## 2013-12-25 DIAGNOSIS — Z79899 Other long term (current) drug therapy: Secondary | ICD-10-CM

## 2013-12-25 DIAGNOSIS — R0602 Shortness of breath: Secondary | ICD-10-CM

## 2013-12-25 DIAGNOSIS — R5381 Other malaise: Secondary | ICD-10-CM

## 2013-12-25 LAB — CBC
HEMATOCRIT: 27.7 % — AB (ref 39.0–52.0)
Hemoglobin: 9.1 g/dL — ABNORMAL LOW (ref 13.0–17.0)
MCH: 27.9 pg (ref 26.0–34.0)
MCHC: 32.9 g/dL (ref 30.0–36.0)
MCV: 85 fL (ref 78.0–100.0)
Platelets: 159 10*3/uL (ref 150–400)
RBC: 3.26 MIL/uL — ABNORMAL LOW (ref 4.22–5.81)
RDW: 16.7 % — ABNORMAL HIGH (ref 11.5–15.5)
WBC: 5.1 10*3/uL (ref 4.0–10.5)

## 2013-12-25 LAB — MDC_IDC_ENUM_SESS_TYPE_INCLINIC
Brady Statistic RV Percent Paced: 98 %
Lead Channel Impedance Value: 480 Ohm
Lead Channel Pacing Threshold Amplitude: 1 V
Lead Channel Setting Pacing Pulse Width: 0.4 ms
Lead Channel Setting Sensing Sensitivity: 6 mV
MDC IDC MSMT BATTERY VOLTAGE: 2.98 V
MDC IDC MSMT LEADCHNL LV IMPEDANCE VALUE: 440 Ohm
MDC IDC MSMT LEADCHNL LV PACING THRESHOLD AMPLITUDE: 1.5 V
MDC IDC MSMT LEADCHNL LV PACING THRESHOLD PULSEWIDTH: 0.8 ms
MDC IDC MSMT LEADCHNL RV PACING THRESHOLD PULSEWIDTH: 0.4 ms
MDC IDC PG MODEL: 3242
MDC IDC PG SERIAL: 7515957
MDC IDC SET LEADCHNL LV PACING AMPLITUDE: 2.5 V
MDC IDC SET LEADCHNL LV PACING PULSEWIDTH: 0.8 ms
MDC IDC SET LEADCHNL RV PACING AMPLITUDE: 2.5 V

## 2013-12-25 LAB — POCT INR: INR: 2.2

## 2013-12-25 LAB — PACEMAKER DEVICE OBSERVATION

## 2013-12-25 MED ORDER — FUROSEMIDE 80 MG PO TABS: ORAL_TABLET | ORAL | Status: DC

## 2013-12-25 MED ORDER — POTASSIUM CHLORIDE CRYS ER 10 MEQ PO TBCR: 10.0000 meq | EXTENDED_RELEASE_TABLET | Freq: Two times a day (BID) | ORAL | Status: DC

## 2013-12-25 MED ORDER — POTASSIUM CHLORIDE ER 20 MEQ PO TBCR: 10.0000 meq | EXTENDED_RELEASE_TABLET | Freq: Two times a day (BID) | ORAL | Status: DC

## 2013-12-25 NOTE — Telephone Encounter (Signed)
Verifying potassium dose - 46meq take 2 tablets daily.  Voiced understanding and will fill with #180 w/3 refills.

## 2013-12-25 NOTE — Assessment & Plan Note (Signed)
He is clearly hypervolemic and requires increased diuretic therapy. Unfortunately his blood pressure is rather low and we may have some limitations in treatment. He is an appropriate ACE inhibitor therapy. He is also receiving digoxin. His blood pressure does not permit beta blocker therapy. He will start taking metolazone and daily (until now he is taking it only on Monday Wednesday and Friday), he'll continue taking furosemide 80 mg in the morning and take an additional 40 mg dose in the afternoon. We will check laboratory tests today and again before his followup visit in one week. I think we have to reestablish a new "dry weight". I think he has lost true body weight since his last appointment. He does appear to be harder to maintain in a compensated volume situation with the new biventricular pacemaker. This may be secondary to progression of his cardiomyopathy or may be less successful resynchronization with a new lead position. Reinforced the importance of daily weights. He is very compliant with sodium restriction. He might require hospitalization if he develops symptomatic hypotension or worsening renal failure

## 2013-12-25 NOTE — Patient Instructions (Addendum)
Your physician has requested that you have an echocardiogram. Echocardiography is a painless test that uses sound waves to create images of your heart. It provides your doctor with information about the size and shape of your heart and how well your heart's chambers and valves are working. This procedure takes approximately one hour. There are no restrictions for this procedure.  Your physician recommends that you return for lab work today.  Your physician recommends that you weigh, daily, at the same time every day, and in the same amount of clothing. Please record your daily weights on the handout provided and bring it to your next appointment.  Your physician has recommended you make the following change in your medication: TAKE METOLAZONE 30 MINUTES PRIOR TO LASIX (FUROSEMIDE) EVERDAY,  Incease Potassium to 2 tablets daily.  Please stop taking your CO Q 10, Fish Oil, Foltx (folic acid) and Vitamin E at this time.  Your physician recommends that you schedule a follow-up appointment in: 1 WEEK WITH EXTENDER

## 2013-12-25 NOTE — Telephone Encounter (Signed)
Tim from [place] has a question about the way the prescription was E-scribed. Potassium  MC

## 2013-12-25 NOTE — Progress Notes (Signed)
Patient ID: [name], male   DOB: [birth year], 80 y.o.   MRN: [id number]       Reason for office visit Congestive heart failure  [name] is not doing well. His daughter brought him in to the office today. He has been sleeping poorly and sometimes gets its nights and days mixed up. He doesn't recall feeling short of breath but he states that he will wake up in the middle of the night and find himself sitting up at the side of the bed. He has tachypnea today. His appetite is mediocre.  He has developed considerable edema. He only weighs about 4 pounds more than his last appointment but by physical exam appears to be much more hypervolemic. He does not have any chest pain. He has not had dizziness or syncope.  His electrocardiogram shows biventricular paced rhythm on a background of permanent atrial fibrillation and the upright V1 QRS complex suggests that the left ventricular lead is active. However the QRS duration is long at 186 ms. He has underlying complete heart block and is pacemaker dependent  He is known to have ischemic cardiomyopathy with a left ventricular ejection fraction around 35% after undergoing bypass surgery in 1995. He had a biventricular device but developed pocket erosion roughly a year after generator change and had to be explanted. He had a "temporary permanent" ventricular pacemaker placed percutaneously for a few weeks. This was complicated by deep venous thrombosis involving the left internal jugular and subclavian vein. A new CRT pacemaker was implanted but during the interval between the 2 devices he developed fairly significant and hard to manage congestive heart failure. He seemed to recover well after the new device was implanted, but seem to require substantially higher doses of diuretics to maintain euvolemia.   No Known Allergies  Current Outpatient Prescriptions  Medication Sig Dispense Refill  . albuterol (PROVENTIL HFA;VENTOLIN HFA) 108 (90 BASE) MCG/ACT  inhaler Inhale 2 puffs into the lungs every 6 (six) hours as needed for wheezing or shortness of breath.      . bimatoprost (LUMIGAN) 0.03 % ophthalmic solution Place 1 drop into both eyes at bedtime.       . captopril (CAPOTEN) 25 MG tablet Take 0.5 tablets (12.5 mg total) by mouth 2 (two) times daily.  90 tablet  2  . COMBIGAN 0.2-0.5 % ophthalmic solution Place 1 drop into both eyes Twice daily.      . digoxin (LANOXIN) 0.125 MG tablet Take 1 tablet (125 mcg total) by mouth daily.  90 tablet  2  . dorzolamide (TRUSOPT) 2 % ophthalmic solution Place 1 drop into the left eye Twice daily.      . furosemide (LASIX) 80 MG tablet Take one tablet (80mg ) in the AM and 1/2 tablet (40mg ) in the afternoon.  135 tablet  3  . levothyroxine (SYNTHROID, LEVOTHROID) 125 MCG tablet Take 125 mcg by mouth daily before breakfast.      . magnesium oxide (MAG-OX) 400 MG tablet Take 400 mg by mouth daily.      . metolazone (ZAROXOLYN) 5 MG tablet TAKE ONE A DAY OR AS DIRECTED 30 MINUTES BEFORE TAKING FUROSEMIDE.  15 tablet  11  . potassium chloride (KLOR-CON M10) 10 MEQ tablet Take 1 tablet (10 mEq total) by mouth 2 (two) times daily.  180 tablet  3  . SYMBICORT 160-4.5 MCG/ACT inhaler INHALE 2 PUFFS 2 TIMES DAILY  10.2 g  3  . SYNTHROID 125 MCG tablet TAKE 1 TABLET (125 MCG  TOTAL) BY MOUTH DAILY.  90 tablet  1  . warfarin (COUMADIN) 5 MG tablet TAKE 1 TABLET DAILY AS DIRECTED.  90 tablet  1  . dorzolamide-timolol (COSOPT) 22.3-6.8 MG/ML ophthalmic solution Place 1 drop into both eyes daily.       No current facility-administered medications for this visit.    Past Medical History  Diagnosis Date  . Ischemic cardiomyopathy     EF-35-45%by echo in 2011; first dx in the 90's but again in 2009 & rec'd 1st BiV ICD   . Paroxysmal atrial fibrillation     sinus rhythm on amiodarone  . Asbestos exposure     plaques on CT  . Compression fracture     T7  . Hypertension   . Coronary artery disease     last cath  2009, hx CABG  . Automatic implantable cardioverter-defibrillator in situ     now explanted  . Hypothyroidism   . CHF (congestive heart failure)     hx.  . Myocardial infarction     INFERIOR [phone] wth PTCA; Ant MI 1995 wth PTCA   . Shortness of breath     "only when external pacemaker wasn't working right" (06/17/2013)  . Arthritis     "a touch all over" (06/17/2013)  . COPD (chronic obstructive pulmonary disease)     "a touch" (8/4//2014)  . Atrial fibrillation, permanent 07/22/2013  . Biventricular cardiac pacemaker in situ 06/2013    St. Jude Allura (pt no longer wished for ICD portion)    Past Surgical History  Procedure Laterality Date  . Inguinal hernia repair Right   . Coronary artery bypass graft  11/01/1994    SVG to first diagonal, to distal RCA, to the ramus intermedius artery, and LIMA the LAD  . 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 Cataract extraction w/ intraocular lens  implant, bilateral Bilateral   . Icd lead removal Left 05/27/2013    Procedure: ICD LEAD REMOVAL;  Surgeon: Evans Lance, MD;  Location: [facility];  Service: Cardiovascular;  Laterality: Left;  . Bi-ventricular pacemaker insertion (crt-p) Right 06/17/2013    St Jude  . Pacemaker removal Left 05/2013  . Coronary angioplasty with stent placement  12/17/2001    to VG-diag wth Zeta stent  . Cardiac catheterization  2009    Patent LIMA-LAD; patent VG-diag wth mild in-stent restenosis; patent VG-ramus intemedius; patent VG-RCA wth 70% mid post lat stenosis  . Cardiac catheterization  03/16/2006    patent LIMA, patent VGs;  EF 30%  . Cardioversion  10/12/2010    successful cardioversion  . Coronary angioplasty  [phone]    LAD;Inf MI PTCA-RCA; ant MI PTCA- LAD    Family History  Problem Relation Age of  Onset  . Heart disease Father     History   Social History  . Marital Status: Widowed    Spouse Name: N/A    Number of Children: N/A  . 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 Smoking status: Never Smoker   . Smokeless tobacco: Never Used  . Alcohol Use: 8.4 oz/week    14 Glasses of wine per week     Comment: 8/4//2014 "couple  glasses of wine qd"  . Drug Use: No  . Sexual Activity: No   Other Topics Concern  . Not on file   Social History Narrative  . No narrative on file    Review of systems: He has had poor sleep, daytime confusion, fatigue, lower extremity edema and anorexia. He denies syncope, fever, chills, heat or cold intolerance, abdominal pain, nausea or vomiting, change in bowel pattern, active bleeding, urinary retention or dysuria, falls, new focal neurological deficits. He is on chronic anticoagulation with warfarin and has not had bleeding problems.  PHYSICAL EXAM BP 90/60  Pulse 70  Resp 24  Ht 5\' 11"  (1.803 m)  Wt 79.652 kg (175 lb 9.6 oz)  BMI 24.50 kg/m2 He is alert and oriented but is not his usual jovial self. He looks tired. General: Alert, oriented x3, no acutedistress Head: no evidence of trauma, PERRL, EOMI, no exophtalmos or lid lag, no myxedema, no xanthelasma; normal ears, nose and oropharynx Neck:  jugular venous pulsations are elevated to the angle of the jaw and there is immediate hepatojugular reflux; v waves are very prominent; brisk carotid pulses without delay and no carotid bruits Chest: clear to auscultation, no signs of consolidation by percussion or palpation, normal fremitus, symmetrical and full respiratory excursions. Both the old device sites and the new CRT pacemaker site appeared healthy without evidence of infection Cardiovascular: normal position and quality of the apical impulse, regular rhythm, normal first and paradoxically split second  heart sounds, no rubs. S3 gallop present, 2 / 6 holosystolic murmur at the left lower sternal border Abdomen: no tenderness or distention, no masses by palpation, no abnormal pulsatility or arterial bruits, normal bowel sounds, no hepatosplenomegaly Extremities: no clubbing, cyanosis or edema; 2+ radial, ulnar and brachial pulses bilaterally; 2+ right femoral, posterior tibial and dorsalis pedis pulses; 2+ left femoral, posterior tibial and dorsalis pedis pulses; no subclavian or femoral bruits Neurological: grossly nonfocal   EKG: Atrial fibrillation, ventricular pacing  Pacemaker interrogation shows normal findings. Left ventricular pacing threshold is excellent and unchanged from before. 98% biventricular pacing. No underlying escape rhythm  Lipid Panel     Component Value Date/Time   CHOL 152 12/13/2012 0950   TRIG 159* 12/13/2012 0950   HDL 28* 12/13/2012 0950   CHOLHDL 5.4 12/13/2012 0950   VLDL 32 12/13/2012 0950   LDLCALC 92 12/13/2012 0950    BMET    Component Value Date/Time   NA 138 10/08/2013 1217   K 3.9 10/08/2013 1217   CL 93* 10/08/2013 1217   CO2 34* 10/08/2013 1217   GLUCOSE 130* 10/08/2013 1217   BUN 29* 10/08/2013 1217   CREATININE 1.20 10/08/2013 1217   CREATININE 1.32 06/17/2013 0919   CREATININE 1.19 06/19/2012 1119   CALCIUM 9.8 10/08/2013 1217   GFRNONAA 47* 06/17/2013 0919   GFRAA 55* 06/17/2013 0919     ASSESSMENT AND PLAN Acute on chronic systolic heart failure He is clearly hypervolemic and requires increased diuretic therapy. Unfortunately his blood pressure is rather low and we may have some limitations in treatment. He is an appropriate ACE inhibitor therapy. He is also receiving digoxin. His blood pressure does not permit beta blocker therapy. He will start taking metolazone and daily (until now he is taking it only on Monday Wednesday and Friday), he'll continue taking furosemide 80 mg in the morning and take an additional 40 mg dose in the afternoon. We  will check laboratory tests today and again before his followup visit in one  week. I think we have to reestablish a new "dry weight". I think he has lost true body weight since his last appointment. He does appear to be harder to maintain in a compensated volume situation with the new biventricular pacemaker. This may be secondary to progression of his cardiomyopathy or may be less successful resynchronization with a new lead position. Reinforced the importance of daily weights. He is very compliant with sodium restriction. He might require hospitalization if he develops symptomatic hypotension or worsening renal failure   recheck an echocardiogram as well Orders Placed This Encounter  Procedures  . Comprehensive metabolic panel  . CBC  . TSH  . B Nat Peptide  . Implantable device check  . EKG 12-Lead  . 2D Echocardiogram without contrast   Meds ordered this encounter  Medications  . dorzolamide-timolol (COSOPT) 22.3-6.8 MG/ML ophthalmic solution    Sig: Place 1 drop into both eyes daily.  Marland Kitchen DISCONTD: KLOR-CON M10 10 MEQ tablet    Sig: Take 10 mEq by mouth daily.  . furosemide (LASIX) 80 MG tablet    Sig: Take one tablet (80mg ) in the AM and 1/2 tablet (40mg ) in the afternoon.    Dispense:  135 tablet    Refill:  3  . DISCONTD: Potassium Chloride ER (KLOR-CON 10) 20 MEQ TBCR    Sig: Take 10 mEq by mouth 2 (two) times daily.    Dispense:  180 tablet    Refill:  3  . potassium chloride (KLOR-CON M10) 10 MEQ tablet    Sig: Take 1 tablet (10 mEq total) by mouth 2 (two) times daily.    Dispense:  180 tablet    Refill:  [place] Jolynne Spurgin, MD, [facility] [phone] office [phone] pager

## 2013-12-26 ENCOUNTER — Inpatient Hospital Stay (HOSPITAL_COMMUNITY)
Admission: AD | Admit: 2013-12-26 | Discharge: 2013-12-31 | DRG: 292 | Disposition: A | Discharge status: Home Health | Payer: Medicare Other | Source: Ambulatory Visit | Attending: Cardiovascular Disease | Admitting: Cardiovascular Disease

## 2013-12-26 ENCOUNTER — Encounter (HOSPITAL_COMMUNITY): Payer: Self-pay | Admitting: General Practice

## 2013-12-26 ENCOUNTER — Telehealth: Payer: Self-pay | Admitting: *Deleted

## 2013-12-26 DIAGNOSIS — N183 Chronic kidney disease, stage 3 unspecified: Secondary | ICD-10-CM | POA: Diagnosis present

## 2013-12-26 DIAGNOSIS — Z8249 Family history of ischemic heart disease and other diseases of the circulatory system: Secondary | ICD-10-CM

## 2013-12-26 DIAGNOSIS — J449 Chronic obstructive pulmonary disease, unspecified: Secondary | ICD-10-CM | POA: Diagnosis present

## 2013-12-26 DIAGNOSIS — N179 Acute kidney failure, unspecified: Secondary | ICD-10-CM | POA: Diagnosis present

## 2013-12-26 DIAGNOSIS — I4821 Permanent atrial fibrillation: Secondary | ICD-10-CM

## 2013-12-26 DIAGNOSIS — Z86718 Personal history of other venous thrombosis and embolism: Secondary | ICD-10-CM

## 2013-12-26 DIAGNOSIS — T82897A Other specified complication of cardiac prosthetic devices, implants and grafts, initial encounter: Secondary | ICD-10-CM

## 2013-12-26 DIAGNOSIS — Z951 Presence of aortocoronary bypass graft: Secondary | ICD-10-CM

## 2013-12-26 DIAGNOSIS — Z7901 Long term (current) use of anticoagulants: Secondary | ICD-10-CM

## 2013-12-26 DIAGNOSIS — I509 Heart failure, unspecified: Secondary | ICD-10-CM | POA: Diagnosis present

## 2013-12-26 DIAGNOSIS — J8410 Pulmonary fibrosis, unspecified: Secondary | ICD-10-CM | POA: Diagnosis present

## 2013-12-26 DIAGNOSIS — Z950 Presence of cardiac pacemaker: Secondary | ICD-10-CM | POA: Diagnosis present

## 2013-12-26 DIAGNOSIS — J4489 Other specified chronic obstructive pulmonary disease: Secondary | ICD-10-CM | POA: Diagnosis present

## 2013-12-26 DIAGNOSIS — I2510 Atherosclerotic heart disease of native coronary artery without angina pectoris: Secondary | ICD-10-CM | POA: Diagnosis present

## 2013-12-26 DIAGNOSIS — I2589 Other forms of chronic ischemic heart disease: Secondary | ICD-10-CM | POA: Diagnosis present

## 2013-12-26 DIAGNOSIS — I5023 Acute on chronic systolic (congestive) heart failure: Secondary | ICD-10-CM | POA: Diagnosis present

## 2013-12-26 DIAGNOSIS — I5043 Acute on chronic combined systolic (congestive) and diastolic (congestive) heart failure: Principal | ICD-10-CM | POA: Diagnosis present

## 2013-12-26 DIAGNOSIS — E039 Hypothyroidism, unspecified: Secondary | ICD-10-CM | POA: Diagnosis present

## 2013-12-26 DIAGNOSIS — Z9861 Coronary angioplasty status: Secondary | ICD-10-CM

## 2013-12-26 DIAGNOSIS — E876 Hypokalemia: Secondary | ICD-10-CM | POA: Diagnosis present

## 2013-12-26 DIAGNOSIS — I4891 Unspecified atrial fibrillation: Secondary | ICD-10-CM | POA: Diagnosis present

## 2013-12-26 DIAGNOSIS — T829XXA Unspecified complication of cardiac and vascular prosthetic device, implant and graft, initial encounter: Secondary | ICD-10-CM

## 2013-12-26 DIAGNOSIS — I129 Hypertensive chronic kidney disease with stage 1 through stage 4 chronic kidney disease, or unspecified chronic kidney disease: Secondary | ICD-10-CM | POA: Diagnosis present

## 2013-12-26 DIAGNOSIS — J45909 Unspecified asthma, uncomplicated: Secondary | ICD-10-CM | POA: Diagnosis present

## 2013-12-26 LAB — COMPREHENSIVE METABOLIC PANEL
ALBUMIN: 4.1 g/dL (ref 3.5–5.2)
ALK PHOS: 88 U/L (ref 39–117)
ALT: 21 U/L (ref 0–53)
AST: 35 U/L (ref 0–37)
BUN: 64 mg/dL — ABNORMAL HIGH (ref 6–23)
CHLORIDE: 88 meq/L — AB (ref 96–112)
CO2: 34 mEq/L — ABNORMAL HIGH (ref 19–32)
CREATININE: 1.67 mg/dL — AB (ref 0.50–1.35)
Calcium: 9.1 mg/dL (ref 8.4–10.5)
Glucose, Bld: 98 mg/dL (ref 70–99)
POTASSIUM: 2.9 meq/L — AB (ref 3.5–5.3)
Sodium: 131 mEq/L — ABNORMAL LOW (ref 135–145)
Total Bilirubin: 1 mg/dL (ref 0.2–1.2)
Total Protein: 7.1 g/dL (ref 6.0–8.3)

## 2013-12-26 LAB — POTASSIUM: Potassium: 3.4 mEq/L — ABNORMAL LOW (ref 3.7–5.3)

## 2013-12-26 LAB — BRAIN NATRIURETIC PEPTIDE: Brain Natriuretic Peptide: 553.8 pg/mL — ABNORMAL HIGH (ref 0.0–100.0)

## 2013-12-26 LAB — TSH: TSH: 1.109 u[IU]/mL (ref 0.350–4.500)

## 2013-12-26 MED ORDER — WARFARIN SODIUM 5 MG PO TABS
5.0000 mg | ORAL_TABLET | ORAL | Status: DC
  Filled 2013-12-26: qty 1

## 2013-12-26 MED ORDER — POTASSIUM CHLORIDE CRYS ER 20 MEQ PO TBCR
20.0000 meq | EXTENDED_RELEASE_TABLET | Freq: Two times a day (BID) | ORAL | Status: DC
  Administered 2013-12-27 – 2013-12-31 (×9): 20 meq via ORAL
  Filled 2013-12-26 (×12): qty 1

## 2013-12-26 MED ORDER — ALBUTEROL SULFATE (2.5 MG/3ML) 0.083% IN NEBU: 2.5000 mg | INHALATION_SOLUTION | Freq: Four times a day (QID) | RESPIRATORY_TRACT | Status: DC | PRN

## 2013-12-26 MED ORDER — BRIMONIDINE TARTRATE 0.2 % OP SOLN
1.0000 [drp] | Freq: Two times a day (BID) | OPHTHALMIC | Status: DC
  Administered 2013-12-26 – 2013-12-28 (×5): 1 [drp] via OPHTHALMIC
  Filled 2013-12-26: qty 5

## 2013-12-26 MED ORDER — ALBUTEROL SULFATE HFA 108 (90 BASE) MCG/ACT IN AERS: 2.0000 | INHALATION_SPRAY | Freq: Four times a day (QID) | RESPIRATORY_TRACT | Status: DC | PRN

## 2013-12-26 MED ORDER — WARFARIN - PHARMACIST DOSING INPATIENT
Freq: Every day | Status: DC
  Administered 2013-12-27 – 2013-12-28 (×2)

## 2013-12-26 MED ORDER — SODIUM CHLORIDE 0.9 % IV SOLN: 250.0000 mL | INTRAVENOUS | Status: DC | PRN

## 2013-12-26 MED ORDER — CAPTOPRIL 12.5 MG PO TABS
12.5000 mg | ORAL_TABLET | Freq: Two times a day (BID) | ORAL | Status: DC
  Administered 2013-12-26 – 2013-12-31 (×10): 12.5 mg via ORAL
  Filled 2013-12-26 (×11): qty 1

## 2013-12-26 MED ORDER — MAGNESIUM OXIDE 400 (241.3 MG) MG PO TABS
400.0000 mg | ORAL_TABLET | Freq: Every day | ORAL | Status: DC
  Administered 2013-12-26 – 2013-12-31 (×6): 400 mg via ORAL
  Filled 2013-12-26 (×6): qty 1

## 2013-12-26 MED ORDER — SODIUM CHLORIDE 0.9 % IJ SOLN
3.0000 mL | INTRAMUSCULAR | Status: DC | PRN
  Administered 2013-12-30: 3 mL via INTRAVENOUS

## 2013-12-26 MED ORDER — BUDESONIDE-FORMOTEROL FUMARATE 160-4.5 MCG/ACT IN AERO
2.0000 | INHALATION_SPRAY | Freq: Two times a day (BID) | RESPIRATORY_TRACT | Status: DC
  Administered 2013-12-26 – 2013-12-31 (×8): 2 via RESPIRATORY_TRACT
  Filled 2013-12-26: qty 6

## 2013-12-26 MED ORDER — BRIMONIDINE TARTRATE-TIMOLOL 0.2-0.5 % OP SOLN: 1.0000 [drp] | Freq: Two times a day (BID) | OPHTHALMIC | Status: DC

## 2013-12-26 MED ORDER — POTASSIUM CHLORIDE CRYS ER 20 MEQ PO TBCR
40.0000 meq | EXTENDED_RELEASE_TABLET | Freq: Once | ORAL | Status: AC
  Administered 2013-12-26: 40 meq via ORAL
  Filled 2013-12-26: qty 2

## 2013-12-26 MED ORDER — POTASSIUM CHLORIDE CRYS ER 20 MEQ PO TBCR
40.0000 meq | EXTENDED_RELEASE_TABLET | Freq: Once | ORAL | Status: AC
Start: 2013-12-26 — End: 2013-12-26
  Administered 2013-12-26: 40 meq via ORAL
  Filled 2013-12-26: qty 2

## 2013-12-26 MED ORDER — DIGOXIN 125 MCG PO TABS
125.0000 ug | ORAL_TABLET | Freq: Every day | ORAL | Status: DC
  Administered 2013-12-26 – 2013-12-30 (×5): 125 ug via ORAL
  Filled 2013-12-26 (×6): qty 1

## 2013-12-26 MED ORDER — FUROSEMIDE 10 MG/ML IJ SOLN
40.0000 mg | Freq: Two times a day (BID) | INTRAMUSCULAR | Status: DC
Start: 2013-12-26 — End: 2013-12-29
  Administered 2013-12-26 – 2013-12-29 (×6): 40 mg via INTRAVENOUS
  Filled 2013-12-26 (×7): qty 4

## 2013-12-26 MED ORDER — WARFARIN SODIUM 2.5 MG PO TABS
2.5000 mg | ORAL_TABLET | ORAL | Status: DC
  Administered 2013-12-26 – 2013-12-28 (×2): 2.5 mg via ORAL
  Filled 2013-12-26 (×3): qty 1

## 2013-12-26 MED ORDER — LATANOPROST 0.005 % OP SOLN
1.0000 [drp] | Freq: Every day | OPHTHALMIC | Status: DC
  Administered 2013-12-27 – 2013-12-28 (×2): 1 [drp] via OPHTHALMIC
  Filled 2013-12-26: qty 2.5

## 2013-12-26 MED ORDER — MAGNESIUM OXIDE 400 MG PO TABS: 400.0000 mg | ORAL_TABLET | Freq: Every day | ORAL | Status: DC

## 2013-12-26 MED ORDER — SODIUM CHLORIDE 0.9 % IJ SOLN
3.0000 mL | Freq: Two times a day (BID) | INTRAMUSCULAR | Status: DC
  Administered 2013-12-26 – 2013-12-31 (×9): 3 mL via INTRAVENOUS

## 2013-12-26 MED ORDER — DORZOLAMIDE HCL-TIMOLOL MAL 2-0.5 % OP SOLN
1.0000 [drp] | Freq: Every day | OPHTHALMIC | Status: DC
  Administered 2013-12-27 – 2013-12-29 (×3): 1 [drp] via OPHTHALMIC
  Filled 2013-12-26: qty 10

## 2013-12-26 MED ORDER — LEVOTHYROXINE SODIUM 125 MCG PO TABS
125.0000 ug | ORAL_TABLET | Freq: Every day | ORAL | Status: DC
  Administered 2013-12-27 – 2013-12-31 (×5): 125 ug via ORAL
  Filled 2013-12-26 (×6): qty 1

## 2013-12-26 MED ORDER — TIMOLOL MALEATE 0.5 % OP SOLN
1.0000 [drp] | Freq: Two times a day (BID) | OPHTHALMIC | Status: DC
  Administered 2013-12-26 – 2013-12-28 (×5): 1 [drp] via OPHTHALMIC
  Filled 2013-12-26: qty 5

## 2013-12-26 MED ORDER — DORZOLAMIDE HCL 2 % OP SOLN
1.0000 [drp] | Freq: Two times a day (BID) | OPHTHALMIC | Status: DC
  Administered 2013-12-26 – 2013-12-28 (×4): 1 [drp] via OPHTHALMIC
  Filled 2013-12-26: qty 10

## 2013-12-26 NOTE — Telephone Encounter (Signed)
Per Dr. Loletha Grayer direct admit to the Heart Failure floor at [facility].  Daughter notified - she will go back to his house and pick him up and take him directly to admitting.  LM for Trish with the info.  Dr. Loletha Grayer notified Audry Riles.

## 2013-12-26 NOTE — H&P (Signed)
History and physical   Reason for hospitalization Congestive heart failure - acute on chronic combined systolic and diastolic Acute on chronic renal failure Severe hypokalemia, diuretic related Severe ischemic cardiomyopathy Chronic pulmonary fibrosis  Permanent atrial fibrillation Complete heart block Status post CRT pacemaker, right subclavian History of CRT-D pocket erosion, left subclavian History of pacemaker lead related left internal jugular and left subclavian deep venous thrombosis   [name] is not doing well. His daughter brought him in to the office today. He has been sleeping poorly and sometimes gets its nights and days mixed up. He doesn't recall feeling short of breath but he states that he will wake up in the middle of the night and find himself sitting up at the side of the bed. He has tachypnea today. His appetite is mediocre.  He has developed considerable edema. He only weighs about 4 pounds more than his last appointment but by physical exam appears to be much more hypervolemic. He does not have any chest pain. He has not had dizziness or syncope.  His electrocardiogram shows biventricular paced rhythm on a background of permanent atrial fibrillation and the upright V1 QRS complex suggests that the left ventricular lead is active. However the QRS duration is long at 186 ms. He has underlying complete heart block and is pacemaker dependent  He is known to have ischemic cardiomyopathy with a left ventricular ejection fraction around 35% after undergoing bypass surgery in 1995. He had a biventricular device but developed pocket erosion roughly a year after generator change and had to be explanted. He had a "temporary permanent" ventricular pacemaker placed percutaneously for a few weeks. This was complicated by deep venous thrombosis involving the left internal jugular and subclavian vein. A new CRT pacemaker was implanted but during the interval between the 2 devices he developed  fairly significant and hard to manage congestive heart failure. He seemed to recover well after the new device was implanted, but seem to require substantially higher doses of diuretics to maintain euvolemia.  No Known Allergies  Current Outpatient Prescriptions   Medication  Sig  Dispense  Refill   .  albuterol (PROVENTIL HFA;VENTOLIN HFA) 108 (90 BASE) MCG/ACT inhaler  Inhale 2 puffs into the lungs every 6 (six) hours as needed for wheezing or shortness of breath.     .  bimatoprost (LUMIGAN) 0.03 % ophthalmic solution  Place 1 drop into both eyes at bedtime.     .  captopril (CAPOTEN) 25 MG tablet  Take 0.5 tablets (12.5 mg total) by mouth 2 (two) times daily.  90 tablet  2   .  COMBIGAN 0.2-0.5 % ophthalmic solution  Place 1 drop into both eyes Twice daily.     .  digoxin (LANOXIN) 0.125 MG tablet  Take 1 tablet (125 mcg total) by mouth daily.  90 tablet  2   .  dorzolamide (TRUSOPT) 2 % ophthalmic solution  Place 1 drop into the left eye Twice daily.     .  furosemide (LASIX) 80 MG tablet  Take one tablet (80mg ) in the AM and 1/2 tablet (40mg ) in the afternoon.  135 tablet  3   .  levothyroxine (SYNTHROID, LEVOTHROID) 125 MCG tablet  Take 125 mcg by mouth daily before breakfast.     .  magnesium oxide (MAG-OX) 400 MG tablet  Take 400 mg by mouth daily.     .  metolazone (ZAROXOLYN) 5 MG tablet  TAKE ONE A DAY OR AS DIRECTED 30 MINUTES BEFORE TAKING  FUROSEMIDE.  15 tablet  11   .  potassium chloride (KLOR-CON M10) 10 MEQ tablet  Take 1 tablet (10 mEq total) by mouth 2 (two) times daily.  180 tablet  3   .  SYMBICORT 160-4.5 MCG/ACT inhaler  INHALE 2 PUFFS 2 TIMES DAILY  10.2 g  3   .  SYNTHROID 125 MCG tablet  TAKE 1 TABLET (125 MCG TOTAL) BY MOUTH DAILY.  90 tablet  1   .  warfarin (COUMADIN) 5 MG tablet  TAKE 1 TABLET DAILY AS DIRECTED.  90 tablet  1   .  dorzolamide-timolol (COSOPT) 22.3-6.8 MG/ML ophthalmic solution  Place 1 drop into both eyes daily.      No current facility-administered  medications for this visit.    Past Medical History   Diagnosis  Date   .  Ischemic cardiomyopathy      EF-35-45%by echo in 2011; first dx in the 90's but again in 2009 & rec'd 1st BiV ICD   .  Paroxysmal atrial fibrillation      sinus rhythm on amiodarone   .  Asbestos exposure      plaques on CT   .  Compression fracture      T7   .  Hypertension    .  Coronary artery disease      last cath 2009, hx CABG   .  Automatic implantable cardioverter-defibrillator in situ      now explanted   .  Hypothyroidism    .  CHF (congestive heart failure)      hx.   .  Myocardial infarction      INFERIOR [phone] wth PTCA; Ant MI 1995 wth PTCA   .  Shortness of breath      "only when external pacemaker wasn't working right" (06/17/2013)   .  Arthritis      "a touch all over" (06/17/2013)   .  COPD (chronic obstructive pulmonary disease)      "a touch" (8/4//2014)   .  Atrial fibrillation, permanent  07/22/2013   .  Biventricular cardiac pacemaker in situ  06/2013     St. Jude Allura (pt no longer wished for ICD portion)    Past Surgical History   Procedure  Laterality  Date   .  Inguinal hernia repair  Right    .  Coronary artery bypass graft   11/01/1994     SVG to first diagonal, to distal RCA, to the ramus intermedius artery, and LIMA the LAD   .  Insert / replace / remove pacemaker   10/05/1998     Original pacemaker 1999; gen change 2006; upgrade to BiV ICD 2009, gen change 10/2012; explantation for skin erosion with chronic infection and temp PM placed until new device placed   .  Cataract extraction w/ intraocular lens implant, bilateral  Bilateral    .  Icd lead removal  Left  05/27/2013     Procedure: ICD LEAD REMOVAL; Surgeon: Evans Lance, MD; Location: [facility]; Service: Cardiovascular; Laterality: Left;   .  Bi-ventricular pacemaker insertion (crt-p)  Right  06/17/2013     St Jude   .  Pacemaker removal  Left  05/2013   .  Coronary angioplasty with stent placement   12/17/2001     to  VG-diag wth Zeta stent   .  Cardiac catheterization   2009     Patent LIMA-LAD; patent VG-diag wth mild in-stent restenosis; patent VG-ramus intemedius; patent VG-RCA wth 70%  mid post lat stenosis   .  Cardiac catheterization   03/16/2006     patent LIMA, patent VGs; EF 30%   .  Cardioversion   10/12/2010     successful cardioversion   .  Coronary angioplasty   [phone]     LAD;Inf MI PTCA-RCA; ant MI PTCA- LAD    Family History   Problem  Relation  Age of Onset   .  Heart disease  Father     History    Social History   .  Marital Status:  Widowed     Spouse Name:  N/A     Number of Children:  N/A   .  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  Smoking status:  Never Smoker   .  Smokeless tobacco:  Never Used   .  Alcohol Use:  8.4 oz/week     14 Glasses of wine per week      Comment: 8/4//2014 "couple glasses of wine qd"   .  Drug Use:  No   .  Sexual Activity:  No    Other Topics  Concern   .  Not on file    Social History Narrative   .  No narrative on file   Review of systems:  He has had poor sleep, daytime confusion, fatigue, lower extremity edema and anorexia. He denies syncope, fever, chills, heat or cold intolerance, abdominal pain, nausea or vomiting, change in bowel pattern, active bleeding, urinary retention or dysuria, falls, new focal neurological deficits. He is on chronic anticoagulation with warfarin and has not had bleeding problems.  PHYSICAL EXAM  BP 90/60  Pulse 70  Resp 24  Ht 5\' 11"  (1.803 m)  Wt 79.652 kg (175 lb 9.6 oz)  BMI 24.50 kg/m2  He is alert and oriented but is not his usual jovial self. He looks tired.  General: Alert, oriented x3, no acutedistress  Head: no evidence of trauma, PERRL, EOMI, no exophtalmos or lid lag, no myxedema, no xanthelasma; normal ears, nose and oropharynx  Neck: jugular venous pulsations are elevated to the angle of the  jaw and there is immediate hepatojugular reflux; v waves are very prominent; brisk carotid pulses without delay and no carotid bruits  Chest: clear to auscultation, no signs of consolidation by percussion or palpation, normal fremitus, symmetrical and full respiratory excursions. Both the old device sites and the new CRT pacemaker site appeared healthy without evidence of infection  Cardiovascular: normal position and quality of the apical impulse, regular rhythm, normal first and paradoxically split second heart sounds, no rubs. S3 gallop present, 2 / 6 holosystolic murmur at the left lower sternal border  Abdomen: no tenderness or distention, no masses by palpation, no abnormal pulsatility or arterial bruits, normal bowel sounds, no hepatosplenomegaly  Extremities: no clubbing, cyanosis or edema; 2+ radial, ulnar and brachial pulses bilaterally; 2+ right femoral, posterior tibial and dorsalis pedis pulses; 2+ left femoral, posterior tibial and dorsalis pedis pulses; no subclavian or femoral bruits  Neurological: grossly nonfocal  EKG: Atrial fibrillation, ventricular pacing  Pacemaker interrogation shows normal findings. Left ventricular pacing threshold is excellent and unchanged from before. 98% biventricular pacing. No underlying escape rhythm  Lipid Panel    Component  Value  Date/Time    CHOL  152  12/13/2012 0950    TRIG  159*  12/13/2012  0950    HDL  28*  12/13/2012 0950    CHOLHDL  5.4  12/13/2012 0950    VLDL  32  12/13/2012 0950    LDLCALC  92  12/13/2012 0950   BMET    Component  Value  Date/Time    NA  138  10/08/2013 1217    K  3.9  10/08/2013 1217    CL  93*  10/08/2013 1217    CO2  34*  10/08/2013 1217    GLUCOSE  130*  10/08/2013 1217    BUN  29*  10/08/2013 1217    CREATININE  1.20  10/08/2013 1217    CREATININE  1.32  06/17/2013 0919    CREATININE  1.19  06/19/2012 1119    CALCIUM  9.8  10/08/2013 1217    GFRNONAA  47*  06/17/2013 0919    GFRAA  55*  06/17/2013 0919   ASSESSMENT  AND PLAN  Acute on chronic systolic heart failure  He is clearly hypervolemic and requires increased diuretic therapy. Unfortunately his blood pressure is rather low and we may have some limitations in treatment. He is an appropriate ACE inhibitor therapy. He is also receiving digoxin. His blood pressure does not permit beta blocker therapy. He will start taking metolazone and daily (until now he is taking it only on Monday Wednesday and Friday), he'll continue taking furosemide 80 mg in the morning and take an additional 40 mg dose in the afternoon. We will check laboratory tests today and again before his followup visit in one week. I think we have to reestablish a new "dry weight". I think he has lost true body weight since his last appointment.  He does appear to be harder to maintain in a compensated volume situation with the new biventricular pacemaker. This may be secondary to progression of his cardiomyopathy or may be less successful resynchronization with a new lead position.  Reinforced the importance of daily weights. He is very compliant with sodium restriction.   We had initially planned outpatient management of his acute heart failure exacerbation, but after receiving the results of the blood laboratory tests decided he needed hospitalization due to severe hypokalemia and acute on chronic renal insufficiency.  He may require temporary inotropic support Consider heart failure consultation Consider echo guided AV delay and CRT optimization.  [name], MD, [facility] [phone] office [phone] pager 12/26/2013 3:28 PM

## 2013-12-26 NOTE — Progress Notes (Signed)
Pt direct admit for CHF, pt a/o, no c/o pain, vss, pt stable

## 2013-12-26 NOTE — Progress Notes (Signed)
ANTICOAGULATION CONSULT NOTE - Initial Consult  Pharmacy Consult for Coumadin Indication: atrial fibrillation  No Known Allergies  Patient Measurements: Height: 5\' 11"  (180.3 cm) Weight: 167 lb 1.7 oz (75.8 kg) IBW/kg (Calculated) : 75.3 Heparin Dosing Weight:   Vital Signs: Temp: 97.1 F (36.2 C) (02/12 1437) Temp src: Oral (02/12 1437) BP: 114/59 mmHg (02/12 1437) Pulse Rate: 71 (02/12 1437)  Labs:  Recent Labs  12/25/13 1248 12/25/13 1419  HGB  --  9.1*  HCT  --  27.7*  PLT  --  159  INR 2.2  --   CREATININE  --  1.67*    Estimated Creatinine Clearance: 33.8 ml/min (by C-G formula based on Cr of 1.67).   Medical History: Past Medical History  Diagnosis Date  . Ischemic cardiomyopathy     EF-35-45%by echo in 2011; first dx in the 90's but again in 2009 & rec'd 1st BiV ICD   . Paroxysmal atrial fibrillation     sinus rhythm on amiodarone  . Asbestos exposure     plaques on CT  . Compression fracture     T7  . Hypertension   . Coronary artery disease     last cath 2009, hx CABG  . Automatic implantable cardioverter-defibrillator in situ     now explanted  . Hypothyroidism   . CHF (congestive heart failure)     hx.  . Myocardial infarction     INFERIOR [phone] wth PTCA; Ant MI 1995 wth PTCA   . Shortness of breath     "only when external pacemaker wasn't working right" (06/17/2013)  . Arthritis     "a touch all over" (06/17/2013)  . COPD (chronic obstructive pulmonary disease)     "a touch" (8/4//2014)  . Atrial fibrillation, permanent 07/22/2013  . Biventricular cardiac pacemaker in situ 06/2013    St. Jude Allura (pt no longer wished for ICD portion)  . Pacemaker     Medications:  Prescriptions prior to admission  Medication Sig Dispense Refill  . albuterol (PROVENTIL HFA;VENTOLIN HFA) 108 (90 BASE) MCG/ACT inhaler Inhale 2 puffs into the lungs every 6 (six) hours as needed for wheezing or shortness of breath.      . bimatoprost (LUMIGAN) 0.03 %  ophthalmic solution Place 1 drop into both eyes at bedtime.       . captopril (CAPOTEN) 25 MG tablet Take 0.5 tablets (12.5 mg total) by mouth 2 (two) times daily.  90 tablet  2  . COMBIGAN 0.2-0.5 % ophthalmic solution Place 1 drop into both eyes Twice daily.      . digoxin (LANOXIN) 0.125 MG tablet Take 125 mcg by mouth at bedtime.      . dorzolamide (TRUSOPT) 2 % ophthalmic solution Place 1 drop into the left eye Twice daily.      . dorzolamide-timolol (COSOPT) 22.3-6.8 MG/ML ophthalmic solution Place 1 drop into both eyes daily.      . furosemide (LASIX) 80 MG tablet Take one tablet (80mg ) in the AM and 1/2 tablet (40mg ) in the afternoon.  135 tablet  3  . levothyroxine (SYNTHROID, LEVOTHROID) 125 MCG tablet Take 125 mcg by mouth daily before breakfast.      . magnesium oxide (MAG-OX) 400 MG tablet Take 400 mg by mouth daily.      . metolazone (ZAROXOLYN) 5 MG tablet TAKE ONE A DAY OR AS DIRECTED 30 MINUTES BEFORE TAKING FUROSEMIDE.  15 tablet  11  . potassium chloride (KLOR-CON M10) 10 MEQ tablet Take 1 tablet (  10 mEq total) by mouth 2 (two) times daily.  180 tablet  3  . SYMBICORT 160-4.5 MCG/ACT inhaler INHALE 2 PUFFS 2 TIMES DAILY  10.2 g  3  . warfarin (COUMADIN) 5 MG tablet TAKE 1 TABLET DAILY AS DIRECTED.  90 tablet  1  . [DISCONTINUED] digoxin (LANOXIN) 0.125 MG tablet Take 1 tablet (125 mcg total) by mouth daily.  90 tablet  2    Assessment: 88yom continuing Coumadin for hx Afib. [facility] and INR was therapeutic at 2.2 - will continue Coumadin PTA regimen (2.5mg  daily except 5mg  MWF) and follow-up daily INR. - Hg 9.1, Plts low normal - No significant bleeding reported  Goal of Therapy:  INR 2-3   Plan:  1. Coumadin 2.5mg  daily except 5mg  MWF 2. Daily INR  Earleen Newport [phone] 12/26/2013,5:44 PM

## 2013-12-26 NOTE — Progress Notes (Signed)
Patient ID: [name], male   DOB: 24-May-1926, 80 y.o.   MRN: [id number]  Received lab results after yesterday's office visit - hyponatremia, acute renal insufficiency and severe hypokalemia. Recommended hospitalization for treatment of CHF. Making arrangements for a hospital telemetry bed  Recommend CHF consult and may benefit from echo guided CRT and AV delay optimization.  [name], MD, [facility] [phone] office [phone] pager 12/26/2013

## 2013-12-27 DIAGNOSIS — I5023 Acute on chronic systolic (congestive) heart failure: Secondary | ICD-10-CM

## 2013-12-27 DIAGNOSIS — I369 Nonrheumatic tricuspid valve disorder, unspecified: Secondary | ICD-10-CM

## 2013-12-27 LAB — GLUCOSE, CAPILLARY
GLUCOSE-CAPILLARY: 144 mg/dL — AB (ref 70–99)
Glucose-Capillary: 101 mg/dL — ABNORMAL HIGH (ref 70–99)

## 2013-12-27 LAB — PROTIME-INR
INR: 2.74 — ABNORMAL HIGH (ref 0.00–1.49)
PROTHROMBIN TIME: 28.1 s — AB (ref 11.6–15.2)

## 2013-12-27 LAB — BASIC METABOLIC PANEL
BUN: 69 mg/dL — AB (ref 6–23)
BUN: 65 mg/dL — ABNORMAL HIGH (ref 6–23)
CALCIUM: 9 mg/dL (ref 8.4–10.5)
CALCIUM: 9.3 mg/dL (ref 8.4–10.5)
CHLORIDE: 90 meq/L — AB (ref 96–112)
CO2: 31 mEq/L (ref 19–32)
CO2: 33 meq/L — AB (ref 19–32)
CREATININE: 1.58 mg/dL — AB (ref 0.50–1.35)
Chloride: 91 mEq/L — ABNORMAL LOW (ref 96–112)
Creatinine, Ser: 1.44 mg/dL — ABNORMAL HIGH (ref 0.50–1.35)
GFR calc Af Amer: 49 mL/min — ABNORMAL LOW (ref >90)
GFR calc non Af Amer: 42 mL/min — ABNORMAL LOW (ref >90)
GFR, EST AFRICAN AMERICAN: 44 mL/min — AB (ref >90)
GFR, EST NON AFRICAN AMERICAN: 38 mL/min — AB (ref >90)
Glucose, Bld: 105 mg/dL — ABNORMAL HIGH (ref 70–99)
Glucose, Bld: 140 mg/dL — ABNORMAL HIGH (ref 70–99)
POTASSIUM: 3.4 meq/L — AB (ref 3.7–5.3)
Potassium: 3.2 mEq/L — ABNORMAL LOW (ref 3.7–5.3)
SODIUM: 138 meq/L (ref 137–147)
Sodium: 138 mEq/L (ref 137–147)

## 2013-12-27 LAB — MAGNESIUM: Magnesium: 2.8 mg/dL — ABNORMAL HIGH (ref 1.5–2.5)

## 2013-12-27 MED ORDER — CYCLOBENZAPRINE HCL 10 MG PO TABS
5.0000 mg | ORAL_TABLET | Freq: Two times a day (BID) | ORAL | Status: DC | PRN
  Administered 2013-12-27: 5 mg via ORAL
  Filled 2013-12-27: qty 1

## 2013-12-27 MED ORDER — WARFARIN SODIUM 2.5 MG PO TABS
2.5000 mg | ORAL_TABLET | Freq: Once | ORAL | Status: AC
  Administered 2013-12-27: 2.5 mg via ORAL
  Filled 2013-12-27: qty 1

## 2013-12-27 MED ORDER — WARFARIN SODIUM 5 MG PO TABS
5.0000 mg | ORAL_TABLET | ORAL | Status: DC
  Filled 2013-12-27: qty 1

## 2013-12-27 MED ORDER — POTASSIUM CHLORIDE CRYS ER 20 MEQ PO TBCR
40.0000 meq | EXTENDED_RELEASE_TABLET | Freq: Once | ORAL | Status: AC
  Administered 2013-12-27: 40 meq via ORAL

## 2013-12-27 NOTE — Progress Notes (Addendum)
ANTICOAGULATION CONSULT NOTE - Follow Up  Pharmacy Consult for Coumadin Indication: atrial fibrillation  No Known Allergies  Patient Measurements: Height: 5\' 11"  (180.3 cm) Weight: 163 lb 2.3 oz (74 kg) IBW/kg (Calculated) : 75.3  Vital Signs: Temp: 97.5 F (36.4 C) (02/13 0634) Temp src: Oral (02/13 0634) BP: 97/63 mmHg (02/13 0634) Pulse Rate: 74 (02/13 0634)  Labs:  Recent Labs  12/25/13 1248 12/25/13 1419 12/27/13 0507  HGB  --  9.1*  --   HCT  --  27.7*  --   PLT  --  159  --   LABPROT  --   --  28.1*  INR 2.2  --  2.74*  CREATININE  --  1.67* 1.58*   Estimated Creatinine Clearance: 35.1 ml/min (by C-G formula based on Cr of 1.58).  Medical History: Past Medical History  Diagnosis Date  . Ischemic cardiomyopathy     EF-35-45%by echo in 2011; first dx in the 90's but again in 2009 & rec'd 1st BiV ICD   . Paroxysmal atrial fibrillation     sinus rhythm on amiodarone  . Asbestos exposure     plaques on CT  . Compression fracture     T7  . Hypertension   . Coronary artery disease     last cath 2009, hx CABG  . Automatic implantable cardioverter-defibrillator in situ     now explanted  . Hypothyroidism   . CHF (congestive heart failure)     hx.  . Myocardial infarction     INFERIOR [phone] wth PTCA; Ant MI 1995 wth PTCA   . Shortness of breath     "only when external pacemaker wasn't working right" (06/17/2013)  . Arthritis     "a touch all over" (06/17/2013)  . COPD (chronic obstructive pulmonary disease)     "a touch" (8/4//2014)  . Atrial fibrillation, permanent 07/22/2013  . Biventricular cardiac pacemaker in situ 06/2013    St. Jude Allura (pt no longer wished for ICD portion)  . Pacemaker    Medications:  Prescriptions prior to admission  Medication Sig Dispense Refill  . albuterol (PROVENTIL HFA;VENTOLIN HFA) 108 (90 BASE) MCG/ACT inhaler Inhale 2 puffs into the lungs every 6 (six) hours as needed for wheezing or shortness of breath.      .  bimatoprost (LUMIGAN) 0.03 % ophthalmic solution Place 1 drop into both eyes at bedtime.       . captopril (CAPOTEN) 25 MG tablet Take 0.5 tablets (12.5 mg total) by mouth 2 (two) times daily.  90 tablet  2  . COMBIGAN 0.2-0.5 % ophthalmic solution Place 1 drop into both eyes Twice daily.      . digoxin (LANOXIN) 0.125 MG tablet Take 125 mcg by mouth at bedtime.      . dorzolamide (TRUSOPT) 2 % ophthalmic solution Place 1 drop into the left eye Twice daily.      . dorzolamide-timolol (COSOPT) 22.3-6.8 MG/ML ophthalmic solution Place 1 drop into both eyes daily.      . furosemide (LASIX) 80 MG tablet Take one tablet (80mg ) in the AM and 1/2 tablet (40mg ) in the afternoon.  135 tablet  3  . levothyroxine (SYNTHROID, LEVOTHROID) 125 MCG tablet Take 125 mcg by mouth daily before breakfast.      . magnesium oxide (MAG-OX) 400 MG tablet Take 400 mg by mouth daily.      . metolazone (ZAROXOLYN) 5 MG tablet TAKE ONE A DAY OR AS DIRECTED 30 MINUTES BEFORE TAKING FUROSEMIDE.  15  tablet  11  . potassium chloride (KLOR-CON M10) 10 MEQ tablet Take 1 tablet (10 mEq total) by mouth 2 (two) times daily.  180 tablet  3  . SYMBICORT 160-4.5 MCG/ACT inhaler INHALE 2 PUFFS 2 TIMES DAILY  10.2 g  3  . warfarin (COUMADIN) 5 MG tablet TAKE 1 TABLET DAILY AS DIRECTED.  90 tablet  1  . [DISCONTINUED] digoxin (LANOXIN) 0.125 MG tablet Take 1 tablet (125 mcg total) by mouth daily.  90 tablet  2   Assessment: 88yom continuing Coumadin for hx Afib. Patient [facility] and INR was therapeutic at 2.2 - will continue Coumadin PTA regimen (2.5mg  daily except 5mg  MWF) and follow-up daily INR. - Hg 9.1, Plts low normal - on 2/11, no repeat CBC - No significant bleeding reported - INR = 2.74 today, trending up...  Goal of Therapy:  INR 2-3   Plan:  1. Coumadin 2.5mg  x1 today 2. Daily INR  Rober Minion, PharmD., MS Clinical Pharmacist Pager:  [phone] Thank you for allowing pharmacy to be part  of this patients care team.  12/27/2013,9:01 AM

## 2013-12-27 NOTE — Progress Notes (Addendum)
Subjective: No SOB  No CP Objective: Filed Vitals:   12/26/13 2118 12/26/13 2127 12/26/13 2214 12/27/13 0634  BP:   105/56 97/63  Pulse:  73 73 74  Temp:   97.2 F (36.2 C) 97.5 F (36.4 C)  TempSrc:   Oral Oral  Resp:   20 18  Height:      Weight:    163 lb 2.3 oz (74 kg)  SpO2: 97%  98% 95%   Weight change:   Intake/Output Summary (Last 24 hours) at 12/27/13 0300 Last data filed at 12/27/13 9233  Gross per 24 hour  Intake    723 ml  Output   2050 ml  Net  -1327 ml    General: Alert, awake, oriented x3, in no acute distress Neck:  JVP is 10 Heart: Regular rate and rhythm, without murmurs, rubs, gallops.  Lungs: Clear to auscultation.  No rales or wheezes. Exemities:  1-2+ edema.   Neuro: Grossly intact, nonfocal.   Lab Results: Results for orders placed during the hospital encounter of 12/26/13 (from the past 24 hour(s))  POTASSIUM     Status: Abnormal   Collection Time    12/26/13 10:50 PM      Result Value Ref Range   Potassium 3.4 (*) 3.7 - 5.3 mEq/L  BASIC METABOLIC PANEL     Status: Abnormal   Collection Time    12/27/13  5:07 AM      Result Value Ref Range   Sodium 138  137 - 147 mEq/L   Potassium 3.2 (*) 3.7 - 5.3 mEq/L   Chloride 90 (*) 96 - 112 mEq/L   CO2 31  19 - 32 mEq/L   Glucose, Bld 105 (*) 70 - 99 mg/dL   BUN 69 (*) 6 - 23 mg/dL   Creatinine, Ser 1.58 (*) 0.50 - 1.35 mg/dL   Calcium 9.0  8.4 - 10.5 mg/dL   GFR calc non Af Amer 38 (*) >90 mL/min   GFR calc Af Amer 44 (*) >90 mL/min  PROTIME-INR     Status: Abnormal   Collection Time    12/27/13  5:07 AM      Result Value Ref Range   Prothrombin Time 28.1 (*) 11.6 - 15.2 seconds   INR 2.74 (*) 0.00 - 1.49    Studies/Results: @RISRSLT24 @  Medications:  Reviewed   @PROBHOSP @  1.  Acute on chronic systolic and diastolic CHF.  S/p BiV x 2.  Symptoms have been difficult to contro since change.  Has diuresed some since yesterday  I/O are not complete as patient had some U.O> before  saving. Will continue lasix  Follow renal function Replete K   Discuss with M [name] and G Taylor.  Echo guided AV optimization.     2.  Afib  Paroxysmal    Continue coumadin  3  CAD  No symptoms of angina  4.  Pulm fibrosisi    5;  CKD  Stage III  Follow with diuresis.  LOS: 1 day   Dorris Carnes 12/27/2013, 8:12 AM

## 2013-12-27 NOTE — Progress Notes (Signed)
1735 Pt complaint for leg cramps . Referred to Laura,PA . With orderrs

## 2013-12-27 NOTE — Progress Notes (Signed)
PT Cancellation Note  Patient Details Name: NAWAF STRANGE MRN: [id number] DOB: 06/05/1926   Cancelled Treatment:    Reason Eval/Treat Not Completed: Other (comment) (Pt sleeping and got no sleep last night.)   INGOLD,Edger Husain 12/27/2013, 4:26 PM  [facility] [phone] (pager)

## 2013-12-27 NOTE — Progress Notes (Signed)
Asked to evaluate patient's CRTP for optimization. Reviewed with Dr Rayann Heman.  Pt was programmed LV3 to can with simultaneous RV and LV pacing.  Reprogrammed LV3 to RV ring with LV first by 33msec.  See paper chart for full details.   Chanetta Marshall, RN, BSN, CCDS 12/27/2013 3:16 PM

## 2013-12-27 NOTE — Progress Notes (Signed)
  Echocardiogram 2D Echocardiogram has been performed.  [name] 12/27/2013, 4:25 PM

## 2013-12-28 LAB — BASIC METABOLIC PANEL
BUN: 65 mg/dL — AB (ref 6–23)
CHLORIDE: 92 meq/L — AB (ref 96–112)
CO2: 29 meq/L (ref 19–32)
Calcium: 9.2 mg/dL (ref 8.4–10.5)
Creatinine, Ser: 1.42 mg/dL — ABNORMAL HIGH (ref 0.50–1.35)
GFR calc Af Amer: 50 mL/min — ABNORMAL LOW (ref >90)
GFR calc non Af Amer: 43 mL/min — ABNORMAL LOW (ref >90)
Glucose, Bld: 102 mg/dL — ABNORMAL HIGH (ref 70–99)
POTASSIUM: 3.5 meq/L — AB (ref 3.7–5.3)
Sodium: 136 mEq/L — ABNORMAL LOW (ref 137–147)

## 2013-12-28 LAB — PROTIME-INR
INR: 2.37 — ABNORMAL HIGH (ref 0.00–1.49)
Prothrombin Time: 25.1 seconds — ABNORMAL HIGH (ref 11.6–15.2)

## 2013-12-28 NOTE — Progress Notes (Signed)
Subjective: 80 yo with hx of CHF, Bi-V  ICD / pacer , A-fib,  He was admitted with CHF symptoms, fatigue, dyspnea.  Has diuresed well.     No SOB  No CP Objective: Filed Vitals:   12/27/13 1351 12/27/13 2124 12/28/13 0514 12/28/13 0950  BP: 105/55 99/46 103/52 101/61  Pulse: 74 74 70 74  Temp: 97.3 F (36.3 C) 97.3 F (36.3 C) 97.3 F (36.3 C)   TempSrc: Oral Oral Oral   Resp: 20 18 20 18   Height:      Weight:   160 lb 6.4 oz (72.757 kg)   SpO2: 100% 100% 100% 100%   Weight change: -6 lb 11.3 oz (-3.043 kg)  Intake/Output Summary (Last 24 hours) at 12/28/13 1257 Last data filed at 12/28/13 1000  Gross per 24 hour  Intake   1040 ml  Output   3050 ml  Net  -2010 ml    General: Alert, awake, oriented x3, in no acute distress Neck:  JVP is 10 Heart: Regular rate and rhythm, without murmurs, rubs, gallops.  Lungs: Clear to auscultation.  No rales or wheezes. Exemities:   2+ edema, chronic stasis changes, red Neuro: Grossly intact, nonfocal.   Lab Results: Results for orders placed during the hospital encounter of 12/26/13 (from the past 24 hour(s))  GLUCOSE, CAPILLARY     Status: Abnormal   Collection Time    12/27/13  4:23 PM      Result Value Ref Range   Glucose-Capillary 144 (*) 70 - 99 mg/dL   Comment 1 Notify RN    MAGNESIUM     Status: Abnormal   Collection Time    12/27/13  6:33 PM      Result Value Ref Range   Magnesium 2.8 (*) 1.5 - 2.5 mg/dL  BASIC METABOLIC PANEL     Status: Abnormal   Collection Time    12/27/13  6:33 PM      Result Value Ref Range   Sodium 138  137 - 147 mEq/L   Potassium 3.4 (*) 3.7 - 5.3 mEq/L   Chloride 91 (*) 96 - 112 mEq/L   CO2 33 (*) 19 - 32 mEq/L   Glucose, Bld 140 (*) 70 - 99 mg/dL   BUN 65 (*) 6 - 23 mg/dL   Creatinine, Ser 1.44 (*) 0.50 - 1.35 mg/dL   Calcium 9.3  8.4 - 10.5 mg/dL   GFR calc non Af Amer 42 (*) >90 mL/min   GFR calc Af Amer 49 (*) >90 mL/min  BASIC METABOLIC PANEL     Status: Abnormal   Collection  Time    12/28/13  5:35 AM      Result Value Ref Range   Sodium 136 (*) 137 - 147 mEq/L   Potassium 3.5 (*) 3.7 - 5.3 mEq/L   Chloride 92 (*) 96 - 112 mEq/L   CO2 29  19 - 32 mEq/L   Glucose, Bld 102 (*) 70 - 99 mg/dL   BUN 65 (*) 6 - 23 mg/dL   Creatinine, Ser 1.42 (*) 0.50 - 1.35 mg/dL   Calcium 9.2  8.4 - 10.5 mg/dL   GFR calc non Af Amer 43 (*) >90 mL/min   GFR calc Af Amer 50 (*) >90 mL/min  PROTIME-INR     Status: Abnormal   Collection Time    12/28/13  5:35 AM      Result Value Ref Range   Prothrombin Time 25.1 (*) 11.6 - 15.2 seconds  INR 2.37 (*) 0.00 - 1.49   Tele:   V paced  Medications:  Reviewed   1.  Acute on chronic systolic and diastolic CHF.  S/p BiV x 2.  He has diuresed fairly well.  Cont. IV lasix.   2.  Afib  Paroxysmal    Continue coumadin  3  CAD  No symptoms of angina  4.  Pulm fibrosis  5;  CKD  Stage III  Follow with diuresis.    LOS: 2 days   Darden Amber. 12/28/2013, 12:57 PM

## 2013-12-28 NOTE — Progress Notes (Signed)
ANTICOAGULATION CONSULT NOTE - Follow Up  Pharmacy Consult for Coumadin Indication: atrial fibrillation  No Known Allergies  Patient Measurements: Height: 5\' 11"  (180.3 cm) Weight: 160 lb 6.4 oz (72.757 kg) (Scale B) IBW/kg (Calculated) : 75.3  Vital Signs: Temp: 97.3 F (36.3 C) (02/14 0514) Temp src: Oral (02/14 0514) BP: 103/52 mmHg (02/14 0514) Pulse Rate: 70 (02/14 0514)  Labs:  Recent Labs  12/25/13 1248 12/25/13 1419 12/27/13 0507 12/27/13 1833 12/28/13 0535  HGB  --  9.1*  --   --   --   HCT  --  27.7*  --   --   --   PLT  --  159  --   --   --   LABPROT  --   --  28.1*  --  25.1*  INR 2.2  --  2.74*  --  2.37*  CREATININE  --  1.67* 1.58* 1.44* 1.42*   Estimated Creatinine Clearance: 38.5 ml/min (by C-G formula based on Cr of 1.42).  Medical History: Past Medical History  Diagnosis Date  . Ischemic cardiomyopathy     EF-35-45%by echo in 2011; first dx in the 90's but again in 2009 & rec'd 1st BiV ICD   . Paroxysmal atrial fibrillation     sinus rhythm on amiodarone  . Asbestos exposure     plaques on CT  . Compression fracture     T7  . Hypertension   . Coronary artery disease     last cath 2009, hx CABG  . Automatic implantable cardioverter-defibrillator in situ     now explanted  . Hypothyroidism   . CHF (congestive heart failure)     hx.  . Myocardial infarction     INFERIOR [phone] wth PTCA; Ant MI 1995 wth PTCA   . Shortness of breath     "only when external pacemaker wasn't working right" (06/17/2013)  . Arthritis     "a touch all over" (06/17/2013)  . COPD (chronic obstructive pulmonary disease)     "a touch" (8/4//2014)  . Atrial fibrillation, permanent 07/22/2013  . Biventricular cardiac pacemaker in situ 06/2013    St. Jude Allura (pt no longer wished for ICD portion)  . Pacemaker    Medications:  Prescriptions prior to admission  Medication Sig Dispense Refill  . albuterol (PROVENTIL HFA;VENTOLIN HFA) 108 (90 BASE) MCG/ACT inhaler  Inhale 2 puffs into the lungs every 6 (six) hours as needed for wheezing or shortness of breath.      . bimatoprost (LUMIGAN) 0.03 % ophthalmic solution Place 1 drop into both eyes at bedtime.       . captopril (CAPOTEN) 25 MG tablet Take 0.5 tablets (12.5 mg total) by mouth 2 (two) times daily.  90 tablet  2  . COMBIGAN 0.2-0.5 % ophthalmic solution Place 1 drop into both eyes Twice daily.      . digoxin (LANOXIN) 0.125 MG tablet Take 125 mcg by mouth at bedtime.      . dorzolamide (TRUSOPT) 2 % ophthalmic solution Place 1 drop into the left eye Twice daily.      . dorzolamide-timolol (COSOPT) 22.3-6.8 MG/ML ophthalmic solution Place 1 drop into both eyes daily.      . furosemide (LASIX) 80 MG tablet Take one tablet (80mg ) in the AM and 1/2 tablet (40mg ) in the afternoon.  135 tablet  3  . levothyroxine (SYNTHROID, LEVOTHROID) 125 MCG tablet Take 125 mcg by mouth daily before breakfast.      . magnesium oxide (MAG-OX)  400 MG tablet Take 400 mg by mouth daily.      . metolazone (ZAROXOLYN) 5 MG tablet TAKE ONE A DAY OR AS DIRECTED 30 MINUTES BEFORE TAKING FUROSEMIDE.  15 tablet  11  . potassium chloride (KLOR-CON M10) 10 MEQ tablet Take 1 tablet (10 mEq total) by mouth 2 (two) times daily.  180 tablet  3  . SYMBICORT 160-4.5 MCG/ACT inhaler INHALE 2 PUFFS 2 TIMES DAILY  10.2 g  3  . warfarin (COUMADIN) 5 MG tablet TAKE 1 TABLET DAILY AS DIRECTED.  90 tablet  1  . [DISCONTINUED] digoxin (LANOXIN) 0.125 MG tablet Take 1 tablet (125 mcg total) by mouth daily.  90 tablet  2   Assessment: 88yom continuing Coumadin for hx Afib. Patient [facility] on [facility] and INR was therapeutic at 2.2 - will continue Coumadin PTA regimen (2.5mg  daily except 5mg  MWF) and follow-up daily INR. - Hg 9.1, Plts low normal - on 2/11, no repeat CBC (2/11)  - No significant bleeding reported - INR = 2.37 today  Medications Reviewed - Anticoagulants / Thyroid Hormones The hypoprothrombinemic effect of  warfarin may be increased by levothyroxine.  All other current medications do not appear to be cause for drug/drug interaction with Warfarin.   Goal of Therapy:  INR 2-3   Plan:  1.  Continue his home regimen starting today with Coumadin 2.5mg  2.  Daily INR 3.  Will check CBC at least weekly while hospitalized  Rober Minion, PharmD., MS Clinical Pharmacist Pager:  [phone] Thank you for allowing pharmacy to be part of this patients care team.  12/28/2013,7:42 AM

## 2013-12-28 NOTE — Plan of Care (Signed)
Problem: Acute Rehab PT Goals(only PT should resolve) Goal: PT Additional Goal #1 To score >19 on DGI, to ensure balance has improved and pt is less at risk for community falls.

## 2013-12-28 NOTE — Evaluation (Signed)
Physical Therapy Evaluation Patient Details Name: [name] MRN: [id number] DOB: 11/28/1925 Today's Date: 12/28/2013 Time: 8416-6063 PT Time Calculation (min): 22 min  PT Assessment / Plan / Recommendation History of Present Illness  Pt is a 80 y.o. male adm from home secondary to LE edema and tachypnea. Pt adm for management of CHF   Clinical Impression  Pt adm due to the above. Presents with limitations in independence with functional mobility and balance deficits with mobility. Pt to benefit from skilled acute PT to address deficits indicated below (See PT problem list). Pt scored 13 on DGI indicating pt is a  High fall risk.  Spoke at length with daughter and pt regarding D/C recommendations. Pt will require 24/7 (A) upon acute D/C for a period of time to reduce risk of falls. Daughter agreeable at this time. Will plan to assess RW vs Cane for mobility.     PT Assessment  Patient needs continued PT services    Follow Up Recommendations  Home health PT;Supervision/Assistance - 24 hour    Does the patient have the potential to tolerate intense rehabilitation      Barriers to Discharge Decreased caregiver support lives alone    Equipment Recommendations   (cane vs RW)    Recommendations for Other Services OT consult   Frequency Min 3X/week    Precautions / Restrictions Precautions Precautions: Fall Restrictions Weight Bearing Restrictions: No   Pertinent Vitals/Pain Stable t/o session. No c/o pain.      Mobility  Bed Mobility Overal bed mobility: Modified Independent General bed mobility comments: effortful but independent Transfers Overall transfer level: Needs assistance Equipment used: None Transfers: Sit to/from Stand Sit to Stand: Min guard General transfer comment: + sway with initial sit to stand; min guard to steady; cues for safety  Ambulation/Gait Ambulation/Gait assistance: Min guard;Min assist Ambulation Distance (Feet): 150 Feet Assistive device:  None Gait Pattern/deviations: Decreased stride length;Step-through pattern;Narrow base of support;Drifts right/left (fwd head posture ) Gait velocity: decreased Gait velocity interpretation: Below normal speed for age/gender General Gait Details: pt unsteady at times with high level balance activities; LOB x1 with balance activities, (A) to recover ; cues for safety   Stairs: Yes Stairs assistance: Min assist Stair Management: No rails;One rail Right;Step to pattern;Forwards Number of Stairs: 2 (x2) General stair comments: attempted steps with and without handrails; pt more steady with handrail cues for safety          PT Diagnosis: Abnormality of gait  PT Problem List: Decreased activity tolerance;Decreased balance;Decreased mobility;Decreased safety awareness;Decreased knowledge of use of DME PT Treatment Interventions: DME instruction;Gait training;Stair training;Functional mobility training;Therapeutic activities;Therapeutic exercise;Balance training;Neuromuscular re-education;Patient/family education     PT Goals(Current goals can be found in the care plan section) Acute Rehab PT Goals Patient Stated Goal: to go home PT Goal Formulation: With patient Time For Goal Achievement: 01/11/14 Potential to Achieve Goals: Good  Visit Information  Last PT Received On: 12/28/13 Assistance Needed: +1 History of Present Illness: Pt is a 80 y.o. male adm from home secondary to LE edema and tachypnea. Pt adm for management of CHF        Prior Functioning  Home Living Family/patient expects to be discharged to:: Private residence Living Arrangements: Alone Available Help at Discharge: Friend(s);Available PRN/intermittently;Family Type of Home: House Home Access: Stairs to enter [employer] of Steps: 3 Entrance Stairs-Rails:  (family is building them prior to D/C ) Home Layout: Two level;Able to live on main level with bedroom/bathroom Home Equipment: None (  walking  ) Additional Comments: Pt has very supportive family; daughter bring pt food and check on him daily Prior Function Level of Independence: Independent Comments: pt drives  Communication Communication: No difficulties Dominant Hand: Right    Cognition  Cognition Arousal/Alertness: Awake/alert Behavior During Therapy: WFL for tasks assessed/performed Overall Cognitive Status: Within Functional Limits for tasks assessed    Extremity/Trunk Assessment Upper Extremity Assessment Upper Extremity Assessment: Defer to OT evaluation Lower Extremity Assessment Lower Extremity Assessment: Overall WFL for tasks assessed Cervical / Trunk Assessment Cervical / Trunk Assessment: Kyphotic   Balance Balance Overall balance assessment: Needs assistance Sitting-balance support: Feet supported;No upper extremity supported Sitting balance-Leahy Scale: Good Standing balance support: During functional activity;No upper extremity supported Standing balance-Leahy Scale: Fair Standing balance comment: + sway High Level Balance Comments: see DGI Standardized Balance Assessment Standardized Balance Assessment : Dynamic Gait Index Dynamic Gait Index Level Surface: Mild Impairment Change in Gait Speed: Mild Impairment Gait with Horizontal Head Turns: Moderate Impairment Gait with Vertical Head Turns: Mild Impairment Gait and Pivot Turn: Mild Impairment Step Over Obstacle: Moderate Impairment Step Around Obstacles: Mild Impairment Steps: Moderate Impairment Total Score: 13  End of Session PT - End of Session Equipment Utilized During Treatment: Gait belt Activity Tolerance: Patient tolerated treatment well Patient left: in bed;with call bell/phone within reach;with family/visitor present Nurse Communication: Mobility status;Precautions  GP     Gustavus Bryant, [place] [phone] 12/28/2013, 11:13 AM

## 2013-12-29 LAB — CBC
HCT: 30.8 % — ABNORMAL LOW (ref 39.0–52.0)
Hemoglobin: 9.7 g/dL — ABNORMAL LOW (ref 13.0–17.0)
MCH: 28.1 pg (ref 26.0–34.0)
MCHC: 31.5 g/dL (ref 30.0–36.0)
MCV: 89.3 fL (ref 78.0–100.0)
PLATELETS: 178 10*3/uL (ref 150–400)
RBC: 3.45 MIL/uL — ABNORMAL LOW (ref 4.22–5.81)
RDW: 16.1 % — AB (ref 11.5–15.5)
WBC: 5.3 10*3/uL (ref 4.0–10.5)

## 2013-12-29 LAB — BASIC METABOLIC PANEL
BUN: 63 mg/dL — ABNORMAL HIGH (ref 6–23)
CHLORIDE: 90 meq/L — AB (ref 96–112)
CO2: 32 mEq/L (ref 19–32)
Calcium: 9.1 mg/dL (ref 8.4–10.5)
Creatinine, Ser: 1.42 mg/dL — ABNORMAL HIGH (ref 0.50–1.35)
GFR calc Af Amer: 50 mL/min — ABNORMAL LOW (ref >90)
GFR, EST NON AFRICAN AMERICAN: 43 mL/min — AB (ref >90)
GLUCOSE: 133 mg/dL — AB (ref 70–99)
POTASSIUM: 3.2 meq/L — AB (ref 3.7–5.3)
Sodium: 136 mEq/L — ABNORMAL LOW (ref 137–147)

## 2013-12-29 LAB — PROTIME-INR
INR: 1.96 — ABNORMAL HIGH (ref 0.00–1.49)
Prothrombin Time: 21.7 seconds — ABNORMAL HIGH (ref 11.6–15.2)

## 2013-12-29 MED ORDER — DORZOLAMIDE HCL-TIMOLOL MAL 2-0.5 % OP SOLN
1.0000 [drp] | Freq: Two times a day (BID) | OPHTHALMIC | Status: DC
  Administered 2013-12-29 – 2013-12-31 (×4): 1 [drp] via OPHTHALMIC

## 2013-12-29 MED ORDER — WARFARIN SODIUM 2.5 MG PO TABS: 2.5000 mg | ORAL_TABLET | ORAL | Status: DC

## 2013-12-29 MED ORDER — FUROSEMIDE 10 MG/ML IJ SOLN
40.0000 mg | Freq: Every day | INTRAMUSCULAR | Status: DC
  Administered 2013-12-30 – 2013-12-31 (×2): 40 mg via INTRAVENOUS
  Filled 2013-12-29 (×2): qty 4

## 2013-12-29 MED ORDER — POTASSIUM CHLORIDE CRYS ER 20 MEQ PO TBCR
20.0000 meq | EXTENDED_RELEASE_TABLET | Freq: Once | ORAL | Status: AC
  Administered 2013-12-29: 20 meq via ORAL

## 2013-12-29 MED ORDER — BIMATOPROST 0.01 % OP SOLN
1.0000 [drp] | Freq: Every day | OPHTHALMIC | Status: DC
  Administered 2013-12-29 – 2013-12-30 (×2): 1 [drp] via OPHTHALMIC
  Filled 2013-12-29: qty 2.5

## 2013-12-29 MED ORDER — WARFARIN SODIUM 5 MG PO TABS
5.0000 mg | ORAL_TABLET | Freq: Once | ORAL | Status: AC
  Administered 2013-12-29: 5 mg via ORAL
  Filled 2013-12-29: qty 1

## 2013-12-29 NOTE — Significant Event (Signed)
bp dropped past giving iv lasix 40mg  at 1000 am 89/44 HR 70, MD aware.

## 2013-12-29 NOTE — Progress Notes (Signed)
Subjective: 80 yo with hx of CHF, Bi-V  ICD / pacer , A-fib,  He was admitted with CHF symptoms, fatigue, dyspnea.  Has diuresed well.    No SOB  No CP He is lightheaded today.  BP is lower.   Objective: Filed Vitals:   12/28/13 2219 12/29/13 0516 12/29/13 0849 12/29/13 0940  BP: 104/58 99/82  100/58  Pulse:      Temp:  97.2 F (36.2 C)    TempSrc:  Oral    Resp:  20    Height:      Weight:  159 lb (72.122 kg)    SpO2:  99% 97%    Weight change: -1 lb 6.4 oz (-0.635 kg)  Intake/Output Summary (Last 24 hours) at 12/29/13 1105 Last data filed at 12/29/13 0840  Gross per 24 hour  Intake   1440 ml  Output   2550 ml  Net  -1110 ml    General: Alert, awake, oriented x3, in no acute distress Neck:  JVP not elevated Heart: Regular rate and rhythm, without murmurs, rubs, gallops.  Lungs: Clear to auscultation.  No rales or wheezes. Exemities:   1+ edema, chronic stasis changes, red Neuro: Grossly intact, nonfocal.   Lab Results: Results for orders placed during the hospital encounter of 12/26/13 (from the past 24 hour(s))  BASIC METABOLIC PANEL     Status: Abnormal   Collection Time    12/29/13  5:00 AM      Result Value Ref Range   Sodium 136 (*) 137 - 147 mEq/L   Potassium 3.2 (*) 3.7 - 5.3 mEq/L   Chloride 90 (*) 96 - 112 mEq/L   CO2 32  19 - 32 mEq/L   Glucose, Bld 133 (*) 70 - 99 mg/dL   BUN 63 (*) 6 - 23 mg/dL   Creatinine, Ser 1.42 (*) 0.50 - 1.35 mg/dL   Calcium 9.1  8.4 - 10.5 mg/dL   GFR calc non Af Amer 43 (*) >90 mL/min   GFR calc Af Amer 50 (*) >90 mL/min  PROTIME-INR     Status: Abnormal   Collection Time    12/29/13  5:00 AM      Result Value Ref Range   Prothrombin Time 21.7 (*) 11.6 - 15.2 seconds   INR 1.96 (*) 0.00 - 1.49  CBC     Status: Abnormal   Collection Time    12/29/13  5:00 AM      Result Value Ref Range   WBC 5.3  4.0 - 10.5 K/uL   RBC 3.45 (*) 4.22 - 5.81 MIL/uL   Hemoglobin 9.7 (*) 13.0 - 17.0 g/dL   HCT 30.8 (*) 39.0 - 52.0 %    MCV 89.3  78.0 - 100.0 fL   MCH 28.1  26.0 - 34.0 pg   MCHC 31.5  30.0 - 36.0 g/dL   RDW 16.1 (*) 11.5 - 15.5 %   Platelets 178  150 - 400 K/uL   Tele:   V paced  Medications:  Reviewed   1.  Acute on chronic systolic and diastolic CHF.  S/p BiV x 2.  He has diuresed fairly well.  He may be a bit over diuresed this am.  Will hold this evening's dose of Lasix to allow him to re-equilibrate.   2.  Afib  Paroxysmal    Continue coumadin  3  CAD  No symptoms of angina  4.  Pulm fibrosis  5;  CKD  Stage III  Follow with  diuresis.    LOS: 3 days   Darden Amber. 12/29/2013, 11:05 AM

## 2013-12-29 NOTE — Progress Notes (Signed)
ANTICOAGULATION CONSULT NOTE - Follow Up  Pharmacy Consult for Coumadin Indication: atrial fibrillation  No Known Allergies  Patient Measurements: Height: 5\' 11"  (180.3 cm) Weight: 159 lb (72.122 kg) IBW/kg (Calculated) : 75.3  Vital Signs: Temp: 97.2 F (36.2 C) (02/15 0516) Temp src: Oral (02/15 0516) BP: 99/82 mmHg (02/15 0516)  Labs:  Recent Labs  12/27/13 0507 12/27/13 1833 12/28/13 0535 12/29/13 0500  HGB  --   --   --  9.7*  HCT  --   --   --  30.8*  PLT  --   --   --  178  LABPROT 28.1*  --  25.1* 21.7*  INR 2.74*  --  2.37* 1.96*  CREATININE 1.58* 1.44* 1.42* 1.42*   Estimated Creatinine Clearance: 38.1 ml/min (by C-G formula based on Cr of 1.42).  Medical History: Past Medical History  Diagnosis Date  . Ischemic cardiomyopathy     EF-35-45%by echo in 2011; first dx in the 90's but again in 2009 & rec'd 1st BiV ICD   . Paroxysmal atrial fibrillation     sinus rhythm on amiodarone  . Asbestos exposure     plaques on CT  . Compression fracture     T7  . Hypertension   . Coronary artery disease     last cath 2009, hx CABG  . Automatic implantable cardioverter-defibrillator in situ     now explanted  . Hypothyroidism   . CHF (congestive heart failure)     hx.  . Myocardial infarction     INFERIOR [phone] wth PTCA; Ant MI 1995 wth PTCA   . Shortness of breath     "only when external pacemaker wasn't working right" (06/17/2013)  . Arthritis     "a touch all over" (06/17/2013)  . COPD (chronic obstructive pulmonary disease)     "a touch" (8/4//2014)  . Atrial fibrillation, permanent 07/22/2013  . Biventricular cardiac pacemaker in situ 06/2013    St. Jude Allura (pt no longer wished for ICD portion)  . Pacemaker    Medications:  Prescriptions prior to admission  Medication Sig Dispense Refill  . albuterol (PROVENTIL HFA;VENTOLIN HFA) 108 (90 BASE) MCG/ACT inhaler Inhale 2 puffs into the lungs every 6 (six) hours as needed for wheezing or shortness of  breath.      . bimatoprost (LUMIGAN) 0.03 % ophthalmic solution Place 1 drop into both eyes at bedtime.       . captopril (CAPOTEN) 25 MG tablet Take 0.5 tablets (12.5 mg total) by mouth 2 (two) times daily.  90 tablet  2  . COMBIGAN 0.2-0.5 % ophthalmic solution Place 1 drop into both eyes Twice daily.      . digoxin (LANOXIN) 0.125 MG tablet Take 125 mcg by mouth at bedtime.      . dorzolamide (TRUSOPT) 2 % ophthalmic solution Place 1 drop into the left eye Twice daily.      . dorzolamide-timolol (COSOPT) 22.3-6.8 MG/ML ophthalmic solution Place 1 drop into both eyes daily.      . furosemide (LASIX) 80 MG tablet Take one tablet (80mg ) in the AM and 1/2 tablet (40mg ) in the afternoon.  135 tablet  3  . levothyroxine (SYNTHROID, LEVOTHROID) 125 MCG tablet Take 125 mcg by mouth daily before breakfast.      . magnesium oxide (MAG-OX) 400 MG tablet Take 400 mg by mouth daily.      . metolazone (ZAROXOLYN) 5 MG tablet TAKE ONE A DAY OR AS DIRECTED 30 MINUTES BEFORE TAKING  FUROSEMIDE.  15 tablet  11  . potassium chloride (KLOR-CON M10) 10 MEQ tablet Take 1 tablet (10 mEq total) by mouth 2 (two) times daily.  180 tablet  3  . SYMBICORT 160-4.5 MCG/ACT inhaler INHALE 2 PUFFS 2 TIMES DAILY  10.2 g  3  . warfarin (COUMADIN) 5 MG tablet TAKE 1 TABLET DAILY AS DIRECTED.  90 tablet  1  . [DISCONTINUED] digoxin (LANOXIN) 0.125 MG tablet Take 1 tablet (125 mcg total) by mouth daily.  90 tablet  2   Assessment: 88yom continuing Coumadin for hx Afib.  His INR has trended downward and is at 1.96 this AM.  He is just below desired goal range on his home regimen.  Will up his dose this evening and then continue his home regimen.  He has some anemia present but no noted acute bleed.  HOME Dose:  Coumadin PTA regimen (2.5mg  daily except 5mg  MWF)   Medications Reviewed - Anticoagulants / Thyroid Hormones The hypoprothrombinemic effect of warfarin may be increased by levothyroxine.  All other current medications do  not appear to be cause for drug/drug interaction with Warfarin.  Goal of Therapy:  INR 2-3   Plan:  1.  Will give Warfarin 5mg  x 1 today and continue his home regimen tomorrow. 2.  Daily INR 3.  Will check CBC at least weekly while hospitalized  Rober Minion, PharmD., MS Clinical Pharmacist Pager:  [phone] Thank you for allowing pharmacy to be part of this patients care team.  12/29/2013,9:26 AM

## 2013-12-30 DIAGNOSIS — Z950 Presence of cardiac pacemaker: Secondary | ICD-10-CM

## 2013-12-30 LAB — PROTIME-INR
INR: 1.87 — ABNORMAL HIGH (ref 0.00–1.49)
PROTHROMBIN TIME: 21 s — AB (ref 11.6–15.2)

## 2013-12-30 LAB — BASIC METABOLIC PANEL
BUN: 49 mg/dL — AB (ref 6–23)
CO2: 30 mEq/L (ref 19–32)
CREATININE: 1.3 mg/dL (ref 0.50–1.35)
Calcium: 9.1 mg/dL (ref 8.4–10.5)
Chloride: 95 mEq/L — ABNORMAL LOW (ref 96–112)
GFR calc Af Amer: 56 mL/min — ABNORMAL LOW (ref >90)
GFR, EST NON AFRICAN AMERICAN: 48 mL/min — AB (ref >90)
GLUCOSE: 112 mg/dL — AB (ref 70–99)
Potassium: 3.3 mEq/L — ABNORMAL LOW (ref 3.7–5.3)
Sodium: 138 mEq/L (ref 137–147)

## 2013-12-30 MED ORDER — ENOXAPARIN SODIUM 80 MG/0.8ML ~~LOC~~ SOLN
70.0000 mg | Freq: Two times a day (BID) | SUBCUTANEOUS | Status: DC
  Administered 2013-12-30 – 2013-12-31 (×2): 70 mg via SUBCUTANEOUS
  Filled 2013-12-30 (×5): qty 0.8

## 2013-12-30 MED ORDER — POTASSIUM CHLORIDE CRYS ER 20 MEQ PO TBCR
40.0000 meq | EXTENDED_RELEASE_TABLET | Freq: Once | ORAL | Status: AC
  Administered 2013-12-30: 40 meq via ORAL
  Filled 2013-12-30: qty 2

## 2013-12-30 MED ORDER — WARFARIN SODIUM 6 MG PO TABS
6.0000 mg | ORAL_TABLET | Freq: Once | ORAL | Status: AC
  Administered 2013-12-30: 6 mg via ORAL
  Filled 2013-12-30: qty 1

## 2013-12-30 NOTE — Progress Notes (Signed)
Subjective: No complaints, waiting for the snow.  Objective: Vital signs in last 24 hours: Temp:  [97.5 F (36.4 C)-98 F (36.7 C)] 97.5 F (36.4 C) (02/16 0550) Pulse Rate:  [70-78] 70 (02/16 0907) Resp:  [20] 20 (02/16 0907) BP: (101-113)/(50-65) 109/61 mmHg (02/16 0907) SpO2:  [99 %-100 %] 100 % (02/16 0907) Weight:  [160 lb 3.2 oz (72.666 kg)] 160 lb 3.2 oz (72.666 kg) (02/16 0550) Weight change: 1 lb 3.2 oz (0.544 kg) Last BM Date: 12/28/13 Intake/Output from previous day: -1167 (total since admit -5424)  Wt 160.3 down from 167.1 02/15 0701 - 02/16 0700 In: 1083 [P.O.:1080; I.V.:3] Out: 2250 [Urine:2250] Intake/Output this shift: Total I/O In: 240 [P.O.:240] Out: -   PE: General:Pleasant affect, NAD Skin:Warm and dry, brisk capillary refill HEENT:normocephalic, sclera clear, mucus membranes moist Neck:supple, mild JVD  Heart:irreg irreg with 2/3 systolic murmur, no gallup, rub or click Lungs:clear without rales, rhonchi, or wheezes IDP:OEUM, non tender, + BS, do not palpate liver spleen or masses Ext:tr -to 1+ lower ext edema, 2+ pedal pulses, 2+ radial pulses Neuro:alert and oriented, MAE, follows commands, + facial symmetry   Lab Results:  Recent Labs  12/29/13 0500  WBC 5.3  HGB 9.7*  HCT 30.8*  PLT 178   BMET  Recent Labs  12/29/13 0500 12/30/13 0516  NA 136* 138  K 3.2* 3.3*  CL 90* 95*  CO2 32 30  GLUCOSE 133* 112*  BUN 63* 49*  CREATININE 1.42* 1.30  CALCIUM 9.1 9.1   No results found for this basename: TROPONINI, CK, MB,  in the last 72 hours  Lab Results  Component Value Date   CHOL 152 12/13/2012   HDL 28* 12/13/2012   LDLCALC 92 12/13/2012   TRIG 159* 12/13/2012   CHOLHDL 5.4 12/13/2012   Lab Results  Component Value Date   HGBA1C 6.1* 06/08/2012     Lab Results  Component Value Date   TSH 1.109 12/25/2013      Studies/Results: No results found.  Medications: I have reviewed the patient's current  medications. Scheduled Meds: . bimatoprost  1 drop Both Eyes QHS  . budesonide-formoterol  2 puff Inhalation BID  . captopril  12.5 mg Oral BID  . digoxin  125 mcg Oral QHS  . dorzolamide-timolol  1 drop Both Eyes BID  . furosemide  40 mg Intravenous Daily  . levothyroxine  125 mcg Oral QAC breakfast  . magnesium oxide  400 mg Oral Daily  . potassium chloride  20 mEq Oral BID  . sodium chloride  3 mL Intravenous Q12H  . warfarin  6 mg Oral ONCE-1800  . Warfarin - Pharmacist Dosing Inpatient   Does not apply q1800   Continuous Infusions:  PRN Meds:.sodium chloride, albuterol, cyclobenzaprine, sodium chloride  Assessment/Plan: Principal Problem:   Acute on chronic combined systolic and diastolic heart failure Active Problems:   CAD- CABG X 4 '95, patent grafts '09   ASTHMA   COPD   ICM- lat EF 35-40% Nov 2011   Long term (current) use of anticoagulants   Atrial fibrillation, permanent   Hypokalemia   Biventricular cardiac pacemaker in situ   Acute on chronic systolic HF (heart failure)  PLAN: INR low, drifted down may need lovenox injection..  Pt had echo guided AV optimization on the 13th.  Continues with hypokalemia on K+ BID and rec'd extra 40 meq today.  Lasix down to 40 mg daily IV.  Cr. Improved.  LOS: 4 days   Time spent with pt. :15 minutes. [facility]  Nurse Practitioner Certified Pager [phone] or after 5pm and on weekends call [phone] 12/30/2013, 12:25 PM   INR decreasing. Lovenox started.  He feels he is at baseline.  Per the family, HHPT was recommended.  Possible d/c tomorrow.  Replace potassium.

## 2013-12-30 NOTE — Progress Notes (Addendum)
ANTICOAGULATION CONSULT NOTE - Follow Up  Pharmacy Consult for Coumadin Indication: atrial fibrillation  No Known Allergies  Patient Measurements: Height: 5\' 11"  (180.3 cm) Weight: 160 lb 3.2 oz (72.666 kg) IBW/kg (Calculated) : 75.3  Vital Signs: Temp: 97.5 F (36.4 C) (02/16 0550) Temp src: Oral (02/16 0550) BP: 109/61 mmHg (02/16 0907) Pulse Rate: 70 (02/16 0907)  Labs:  Recent Labs  12/28/13 0535 12/29/13 0500 12/30/13 0516  HGB  --  9.7*  --   HCT  --  30.8*  --   PLT  --  178  --   LABPROT 25.1* 21.7* 21.0*  INR 2.37* 1.96* 1.87*  CREATININE 1.42* 1.42* 1.30   Estimated Creatinine Clearance: 41.9 ml/min (by C-G formula based on Cr of 1.3).  Medical History: Past Medical History  Diagnosis Date  . Ischemic cardiomyopathy     EF-35-45%by echo in 2011; first dx in the 90's but again in 2009 & rec'd 1st BiV ICD   . Paroxysmal atrial fibrillation     sinus rhythm on amiodarone  . Asbestos exposure     plaques on CT  . Compression fracture     T7  . Hypertension   . Coronary artery disease     last cath 2009, hx CABG  . Automatic implantable cardioverter-defibrillator in situ     now explanted  . Hypothyroidism   . CHF (congestive heart failure)     hx.  . Myocardial infarction     INFERIOR [phone] wth PTCA; Ant MI 1995 wth PTCA   . Shortness of breath     "only when external pacemaker wasn't working right" (06/17/2013)  . Arthritis     "a touch all over" (06/17/2013)  . COPD (chronic obstructive pulmonary disease)     "a touch" (8/4//2014)  . Atrial fibrillation, permanent 07/22/2013  . Biventricular cardiac pacemaker in situ 06/2013    St. Jude Allura (pt no longer wished for ICD portion)  . Pacemaker    Medications:  Prescriptions prior to admission  Medication Sig Dispense Refill  . albuterol (PROVENTIL HFA;VENTOLIN HFA) 108 (90 BASE) MCG/ACT inhaler Inhale 2 puffs into the lungs every 6 (six) hours as needed for wheezing or shortness of breath.       . bimatoprost (LUMIGAN) 0.03 % ophthalmic solution Place 1 drop into both eyes at bedtime.       . captopril (CAPOTEN) 25 MG tablet Take 0.5 tablets (12.5 mg total) by mouth 2 (two) times daily.  90 tablet  2  . digoxin (LANOXIN) 0.125 MG tablet Take 125 mcg by mouth at bedtime.      . dorzolamide-timolol (COSOPT) 22.3-6.8 MG/ML ophthalmic solution Place 1 drop into both eyes 2 (two) times daily.       . furosemide (LASIX) 80 MG tablet Take one tablet (80mg ) in the AM and 1/2 tablet (40mg ) in the afternoon.  135 tablet  3  . levothyroxine (SYNTHROID, LEVOTHROID) 125 MCG tablet Take 125 mcg by mouth daily before breakfast.      . magnesium oxide (MAG-OX) 400 MG tablet Take 400 mg by mouth daily.      . metolazone (ZAROXOLYN) 5 MG tablet TAKE ONE A DAY OR AS DIRECTED 30 MINUTES BEFORE TAKING FUROSEMIDE.  15 tablet  11  . potassium chloride (KLOR-CON M10) 10 MEQ tablet Take 1 tablet (10 mEq total) by mouth 2 (two) times daily.  180 tablet  3  . SYMBICORT 160-4.5 MCG/ACT inhaler INHALE 2 PUFFS 2 TIMES DAILY  10.2 g  3  . warfarin (COUMADIN) 5 MG tablet TAKE 1 TABLET DAILY AS DIRECTED.  90 tablet  1  . [DISCONTINUED] digoxin (LANOXIN) 0.125 MG tablet Take 1 tablet (125 mcg total) by mouth daily.  90 tablet  2   Assessment: 88yom continuing Coumadin for hx Afib.  His INR has trended downward and is at 1.87 this AM.  He is just below desired goal range on his home regimen.  Will up his dose this evening.  He has some anemia present but no noted acute bleed.  Asked by cards to add full-dose Lovenox until INR therapeutic.  Est CrCl ~ 40 ml/min.  HOME Dose:  Coumadin PTA regimen (2.5mg  daily except 5mg  MWF)   Medications Reviewed - Anticoagulants / Thyroid Hormones The hypoprothrombinemic effect of warfarin may be increased by levothyroxine.  All other current medications do not appear to be cause for drug/drug interaction with Warfarin.  Goal of Therapy:  INR 2-3   Plan:  1.  Will give Warfarin 6  mg x 1 today. 2.  Daily INR 3.  Will check CBC at least weekly while hospitalized 4. Add Lovenox 70 mg q 12 hrs until INR therapeutic.  Uvaldo Rising, BCPS  Clinical Pharmacist Pager [phone]  12/30/2013 10:42 AM

## 2013-12-30 NOTE — Progress Notes (Signed)
Patient's potassium level 3.3.  Lorretta Harp notified.  Orders made

## 2013-12-30 NOTE — Progress Notes (Signed)
Patient complaining of tingling sensation in left leg.  He states that it has been here since admission.  Neuro he his intact. Arms and legs have strong and equal strength. Nilda Calamity, PA made aware.   Will continue to monitor patient.

## 2013-12-30 NOTE — Progress Notes (Signed)
Physical Therapy Treatment Patient Details Name: [name] MRN: [id number] DOB: July 25, 1926 Today's Date: 12/30/2013 Time: 0102-7253 PT Time Calculation (min): 23 min  PT Assessment / Plan / Recommendation  History of Present Illness Pt is a 80 y.o. male adm from home secondary to LE edema and tachypnea. Pt adm for management of CHF    PT Comments   Pt demonstrates increased balance today (17 on DGI), however note in busy hallway with distractions, he tends to "stumble."  He was able to recover independently and did not demonstrate any overt LOB during session.  Spoke again at length with pt and daughter about follow up recommendations.  Continue to agree with HHPT for follow up.  Pt and daughter state that he will D/C to another daughter's house and pt will be alone from 8-5 during the day.  Encourage pt to remain in house until he has supervision outside and also for pt to keep phone on him in case of emergency.  Also discussed fall risk and need for pts family to intermittently call and check on pt for increased safety.  Ended discussion with conversation about use of cane outdoors (to wait for HHPT) and possibly getting life alert for increased safety at home.  Both verbalize understanding.    Follow Up Recommendations  Home health PT;Supervision/Assistance - 24 hour     Does the patient have the potential to tolerate intense rehabilitation     Barriers to Discharge        Equipment Recommendations  Cane    Recommendations for Other Services    Frequency Min 3X/week   Progress towards PT Goals Progress towards PT goals: Progressing toward goals  Plan Current plan remains appropriate    Precautions / Restrictions Precautions Precautions: Fall Restrictions Weight Bearing Restrictions: No   Pertinent Vitals/Pain No pain    Mobility  Transfers Overall transfer level: Modified independent Equipment used: None Transfers: Sit to/from Stand Sit to Stand: Modified independent  (Device/Increase time) Ambulation/Gait Ambulation/Gait assistance: Supervision Ambulation Distance (Feet): 300 Feet Assistive device: None Gait Pattern/deviations: Step-through pattern;Decreased stride length;Trunk flexed;Narrow base of support Gait velocity: decreased General Gait Details: Pt overall steady, however did note pt with mild LOB when distracted in crowded hallway.  Pt able to self correct.  Stairs: Yes Stairs assistance: Supervision Stair Management: One rail Right;Alternating pattern;Forwards Number of Stairs: 11 General stair comments: Allowed pt to use R handrail as he will have this at daughters house and daughter is to install handrail at pts house before he returns there.     Exercises     PT Diagnosis:    PT Problem List:   PT Treatment Interventions:     PT Goals (current goals can now be found in the care plan section) Acute Rehab PT Goals PT Goal Formulation: With patient Time For Goal Achievement: 01/11/14 Potential to Achieve Goals: Good  Visit Information  Last PT Received On: 12/30/13 Assistance Needed: +1 History of Present Illness: Pt is a 80 y.o. male adm from home secondary to LE edema and tachypnea. Pt adm for management of CHF     Subjective Data      Cognition  Cognition Arousal/Alertness: Awake/alert Behavior During Therapy: WFL for tasks assessed/performed Overall Cognitive Status: Within Functional Limits for tasks assessed    Balance  Standardized Balance Assessment Standardized Balance Assessment : Dynamic Gait Index Dynamic Gait Index Level Surface: Normal Change in Gait Speed: Mild Impairment Gait with Horizontal Head Turns: Mild Impairment Gait with Vertical Head  Turns: Mild Impairment Gait and Pivot Turn: Mild Impairment Step Over Obstacle: Mild Impairment Step Around Obstacles: Mild Impairment Steps: Mild Impairment Total Score: 17  End of Session PT - End of Session Activity Tolerance: Patient tolerated treatment  well Patient left: in chair;with family/visitor present Nurse Communication: Mobility status   GP     Denice Bors 12/30/2013, 11:05 AM

## 2013-12-30 NOTE — Care Management Note (Signed)
    Page 1 of 2   12/30/2013     2:03:02 PM   CARE MANAGEMENT NOTE 12/30/2013  Patient:  [name], [name]   Account Number:  [id number]  Date Initiated:  12/30/2013  Documentation initiated by:  [facility]  Subjective/Objective Assessment:   80 YO male Reason for hospitalization  Congestive heart failure - acute on chronic combined systolic and diastolic//Home alone     Action/Plan:   IV diureses//Home with HH vs SNF   Anticipated DC Date:  01/01/2014   Anticipated DC Plan:  [place]  In-house referral  Clinical Social Worker      DC Forensic scientist  CM consult      [facility]  HOME HEALTH   Choice offered to / List presented to:  C-4 Adult Children        [place] arranged  HH-1 RN  [facility] [facility]      [place].   Status of service:   Medicare Important Message given?   (If response is "NO", the following Medicare IM given date fields will be blank) Date Medicare IM given:   Date Additional Medicare IM given:    Discharge Disposition:    Per UR Regulation:    If discussed at Long Length of Stay Meetings, dates discussed:    Comments:  12/30/13 [place], RN, BSN, [employer] [phone] Spoke with pt at bedside regarding discharge planning for [place] vs [place]. Offered pt list of home health agencies to choose from.  Pt chose [facility] to render services of RN and PT if going home but would like to hear options of SNF. Lelan Pons of [facility]) notified.  No DME needs identified at this time;  pt uses cane at home.

## 2013-12-31 DIAGNOSIS — I2510 Atherosclerotic heart disease of native coronary artery without angina pectoris: Secondary | ICD-10-CM

## 2013-12-31 LAB — PRO B NATRIURETIC PEPTIDE: PRO B NATRI PEPTIDE: 1900 pg/mL — AB (ref 0–450)

## 2013-12-31 LAB — COMPREHENSIVE METABOLIC PANEL
ALT: 22 U/L (ref 0–53)
AST: 32 U/L (ref 0–37)
Albumin: 3.4 g/dL — ABNORMAL LOW (ref 3.5–5.2)
Alkaline Phosphatase: 81 U/L (ref 39–117)
BILIRUBIN TOTAL: 0.6 mg/dL (ref 0.3–1.2)
BUN: 44 mg/dL — AB (ref 6–23)
CHLORIDE: 96 meq/L (ref 96–112)
CO2: 27 mEq/L (ref 19–32)
Calcium: 8.9 mg/dL (ref 8.4–10.5)
Creatinine, Ser: 1.2 mg/dL (ref 0.50–1.35)
GFR calc Af Amer: 61 mL/min — ABNORMAL LOW (ref >90)
GFR calc non Af Amer: 53 mL/min — ABNORMAL LOW (ref >90)
GLUCOSE: 140 mg/dL — AB (ref 70–99)
Potassium: 3.8 mEq/L (ref 3.7–5.3)
Sodium: 137 mEq/L (ref 137–147)
Total Protein: 7 g/dL (ref 6.0–8.3)

## 2013-12-31 LAB — PROTIME-INR
INR: 1.91 — ABNORMAL HIGH (ref 0.00–1.49)
Prothrombin Time: 21.3 seconds — ABNORMAL HIGH (ref 11.6–15.2)

## 2013-12-31 LAB — MAGNESIUM: Magnesium: 2.5 mg/dL (ref 1.5–2.5)

## 2013-12-31 MED ORDER — POTASSIUM CHLORIDE CRYS ER 10 MEQ PO TBCR: 20.0000 meq | EXTENDED_RELEASE_TABLET | Freq: Two times a day (BID) | ORAL | Status: DC

## 2013-12-31 NOTE — Progress Notes (Addendum)
I stopped in to see patient and daughters before discharge.  He plans to discharge home with daughter for short term however return home alone after that stay.  We reviewed HF education including signs and symptoms of HF, when to call the physician, importance of daily weights, low sodium diet and fluid restriction.  He will be set up with [facility] at discharge.  He will have a follow-up appointment with [facility] on Thursday.  Carole Binning RN, BSN, PCCN--Heart Failure Nurse Navigator Patients daughters numbers (where he will be at discharge)  [place] [phone] work [phone]  and [place]  [phone]

## 2013-12-31 NOTE — Progress Notes (Signed)
Pt being dc to home with family, d/c instructions given to pt and family, medications and follow up appointments reviewed, pt and family verbalized understanding, pt leaving with family via wheelchair, pt stable

## 2013-12-31 NOTE — Progress Notes (Signed)
Subjective: No complaints, wants to go home.  Objective: Vital signs in last 24 hours: Temp:  [97.3 F (36.3 C)-97.8 F (36.6 C)] 97.3 F (36.3 C) (02/17 0500) Pulse Rate:  [68-73] 72 (02/17 0943) Resp:  [18-20] 18 (02/16 2114) BP: (103-112)/(52-59) 112/59 mmHg (02/17 0943) SpO2:  [100 %] 100 % (02/17 0853) Weight:  [161 lb 1.6 oz (73.074 kg)] 161 lb 1.6 oz (73.074 kg) (02/17 0500) Weight change: 14.4 oz (0.408 kg) Last BM Date: 12/29/13 Intake/Output from previous day: -1167 (total since admit -5424)  Wt 160.3 down from 167.1 02/16 0701 - 02/17 0700 In: 1203 [P.O.:1200; I.V.:3] Out: 1200 [Urine:1200] Intake/Output this shift:    PE: General:Pleasant affect, NAD Skin:Warm and dry, brisk capillary refill HEENT:normocephalic, sclera clear, mucus membranes moist Neck:supple, Heart:irreg irreg with 2/6 systolic murmur, no gallup, rub or click Lungs:clear without rales, rhonchi, or wheezes YKD:XIPJ, non tender, + BS, do not palpate liver spleen or masses Ext:tr -to 1+ lower ext edema, 2+ pedal pulses, 2+ radial pulses Neuro:alert and oriented, MAE, follows commands, + facial symmetry   Lab Results:  Recent Labs  12/29/13 0500  WBC 5.3  HGB 9.7*  HCT 30.8*  PLT 178   BMET  Recent Labs  12/30/13 0516 12/31/13 0415  NA 138 137  K 3.3* 3.8  CL 95* 96  CO2 30 27  GLUCOSE 112* 140*  BUN 49* 44*  CREATININE 1.30 1.20  CALCIUM 9.1 8.9   No results found for this basename: TROPONINI, CK, MB,  in the last 72 hours  Lab Results  Component Value Date   CHOL 152 12/13/2012   HDL 28* 12/13/2012   LDLCALC 92 12/13/2012   TRIG 159* 12/13/2012   CHOLHDL 5.4 12/13/2012   Lab Results  Component Value Date   HGBA1C 6.1* 06/08/2012     Lab Results  Component Value Date   TSH 1.109 12/25/2013      Studies/Results: No results found.  Medications: I have reviewed the patient's current medications. Scheduled Meds: . bimatoprost  1 drop Both Eyes QHS  .  budesonide-formoterol  2 puff Inhalation BID  . captopril  12.5 mg Oral BID  . digoxin  125 mcg Oral QHS  . dorzolamide-timolol  1 drop Both Eyes BID  . enoxaparin (LOVENOX) injection  70 mg Subcutaneous Q12H  . furosemide  40 mg Intravenous Daily  . levothyroxine  125 mcg Oral QAC breakfast  . magnesium oxide  400 mg Oral Daily  . potassium chloride  20 mEq Oral BID  . sodium chloride  3 mL Intravenous Q12H  . Warfarin - Pharmacist Dosing Inpatient   Does not apply q1800   Continuous Infusions:  PRN Meds:.sodium chloride, albuterol, cyclobenzaprine, sodium chloride  Assessment/Plan: Principal Problem:   Acute on chronic combined systolic and diastolic heart failure Active Problems:   CAD- CABG X 4 '95, patent grafts '09   ASTHMA   COPD   ICM- lat EF 35-40% Nov 2011   Long term (current) use of anticoagulants   Atrial fibrillation, permanent   Hypokalemia   Biventricular cardiac pacemaker in situ   Acute on chronic systolic HF (heart failure)  PLAN: INR low, received lovenox injections..  Pt had echo guided AV optimization on the 13th.  Potassium better after supplementation.  Lasix down to 40 mg daily IV.  Cr. Improved.  Would discharge on home dose of Lasix. He was also taking metolazone 3 days a week at home. Hold this  until he is seen in the office later this week given his hypokalemia. Will need INR check and visit in the office later this week along with check of electrolytes, BMP.  Continue home potassium supplementation.  Patient needs to check daily weights. If his weight goes up more than 2 pounds in a day, he should contact the office. He will likely be told to take a metolazone tablet.     LOS: 5 days    Almadelia Looman S.    12/31/2013, 10:26 AM

## 2013-12-31 NOTE — Discharge Instructions (Signed)
Weigh daily Call [phone] if weight climbs more than 2 pounds in a day or 5 pounds in a week. No salt to very little salt in your diet.  No more than 2000 mg in a day. Call if increased shortness of breath or increased swelling.  Have lab -potassium checked in lab on 1st floor of our office on Thursday.  Heart Healthy and little salt diet.  Hold metolazone until you are seen in the office.  Weigh today on arrival home and keep daily record.  Weight here 161.1 pounds.  We will check you on Thursday, call if any questions or problems.

## 2013-12-31 NOTE — Discharge Summary (Signed)
Physician Discharge Summary       Patient ID: [name] MRN: [id number] DOB/AGE: 78/11/1925 78 y.o.  Admit date: 12/26/2013 Discharge date: 12/31/2013  Discharge Diagnoses:  Principal Problem:   Acute on chronic combined systolic and diastolic heart failure Active Problems:   CAD- CABG X 4 '95, patent grafts '09   ASTHMA   COPD   ICM- lat EF 35-40% Nov 2011   Long term (current) use of anticoagulants   Atrial fibrillation, permanent   Hypokalemia   Biventricular cardiac pacemaker in situ   Acute on chronic systolic HF (heart failure)   Discharged Condition: good  Procedures: none   Hospital Course:  80 year old WM admitted 12/26/13 from the office not doing well.   He has been sleeping poorly and sometimes gets his nights and days mixed up. He doesn't recall feeling short of breath but he stated that he will wake up in the middle of the night and find himself sitting up at the side of the bed. He has tachypnea on admit. His appetite is mediocre.   He has developed considerable edema. He only weighs about 4 pounds more than his last appointment but by physical exam appears to be much more hypervolemic. He does not have any chest pain. He has not had dizziness or syncope.   His electrocardiogram shows biventricular paced rhythm on a background of permanent atrial fibrillation and the upright V1 QRS complex suggests that the left ventricular lead is active. However the QRS duration is long at 186 ms. He has underlying complete heart block and is pacemaker dependent  He is known to have ischemic cardiomyopathy with a left ventricular ejection fraction around 35% after undergoing bypass surgery in 1995. He had a biventricular device but developed pocket erosion roughly a year after generator change and had to be explanted. He had a "temporary permanent" ventricular pacemaker placed percutaneously for a few weeks. This was complicated by deep venous thrombosis involving the left  internal jugular and subclavian vein. A new CRT pacemaker was implanted but during the interval between the 2 devices he developed fairly significant and hard to manage congestive heart failure. He seemed to recover well after the new device was implanted, but seem to require substantially higher doses of diuretics to maintain euvolemia.    He was admitted and found to have hypokalemia at 2.9.  K+ was replaced.  It has been an ongoing problem replacing.  At d/c K+ is 3.8.  We are holding Metolazone until office visit to decide if it should be resumed.  He will need BMP day of appt.   He was diuresed (-5421 ml total for hospitalization) .  Wt at discharge 161.1 down from 167.1 lbs.  ProBNP at discharge 1900 -I only have BNP to compare 553 on admit. His edema has resolved.  He will weigh daily, and call if weight up 2 pounds in a day.   Pt did have echo guided AV optimization 12/27/13 with his BiV pacer.    INR drifted down during hospital stay, he was given lovenox to bridge and at discharge was 1.91.  He will follow up in coumadin clinic on Thursday.    12/31/13 was seen and evaluated by Dr. Irish Lack and found ready for discharge with close follow up.      PT did eval. Throughout hospitalization.  His balance was improved aht discharge.  He is asking for Home Health CHF.   Consults: None  Significant Diagnostic Studies:  BMET  Component Value Date/Time   NA 137 12/31/2013 0415   K 3.8 12/31/2013 0415   CL 96 12/31/2013 0415   CO2 27 12/31/2013 0415   GLUCOSE 140* 12/31/2013 0415   BUN 44* 12/31/2013 0415   CREATININE 1.20 12/31/2013 0415   CREATININE 1.67* 12/25/2013 1419   CREATININE 1.19 06/19/2012 1119   CALCIUM 8.9 12/31/2013 0415   GFRNONAA 53* 12/31/2013 0415   GFRAA 61* 12/31/2013 0415    CBC    Component Value Date/Time   WBC 5.3 12/29/2013 0500   RBC 3.45* 12/29/2013 0500   RBC 4.53 06/08/2012 1645   HGB 9.7* 12/29/2013 0500   HCT 30.8* 12/29/2013 0500   PLT 178 12/29/2013 0500   MCV  89.3 12/29/2013 0500   MCH 28.1 12/29/2013 0500   MCHC 31.5 12/29/2013 0500   RDW 16.1* 12/29/2013 0500   LYMPHSABS 0.3* 05/31/2013 1951   MONOABS 1.1* 05/31/2013 1951   EOSABS 0.0 05/31/2013 1951   BASOSABS 0.0 05/31/2013 1951       Discharge Exam: Blood pressure 112/59, pulse 72, temperature 97.3 F (36.3 C), temperature source Oral, resp. rate 18, height 5\' 11"  (1.803 m), weight 161 lb 1.6 oz (73.074 kg), SpO2 100.00%.   Disposition: 01-Home or Self Care   Future Appointments Provider Department Dept Phone   01/01/2014 11:40 AM Ilean [facility] [phone]   01/07/2014 11:00 AM [facility] [place] [address] [phone]   02/05/2014 11:30 AM Tommy Medal, RPH-CPP [facility] [phone]       Medication List    STOP taking these medications       metolazone 5 MG tablet  Commonly known as:  ZAROXOLYN      TAKE these medications       albuterol 108 (90 BASE) MCG/ACT inhaler  Commonly known as:  PROVENTIL HFA;VENTOLIN HFA  Inhale 2 puffs into the lungs every 6 (six) hours as needed for wheezing or shortness of breath.     bimatoprost 0.03 % ophthalmic solution  Commonly known as:  LUMIGAN  Place 1 drop into both eyes at bedtime.     captopril 25 MG tablet  Commonly known as:  CAPOTEN  Take 0.5 tablets (12.5 mg total) by mouth 2 (two) times daily.     digoxin 0.125 MG tablet  Commonly known as:  LANOXIN  Take 125 mcg by mouth at bedtime.     dorzolamide-timolol 22.3-6.8 MG/ML ophthalmic solution  Commonly known as:  COSOPT  Place 1 drop into both eyes 2 (two) times daily.     furosemide 80 MG tablet  Commonly known as:  LASIX  Take one tablet (80mg ) in the AM and 1/2 tablet (40mg ) in the afternoon.     levothyroxine 125 MCG tablet  Commonly known as:  SYNTHROID, LEVOTHROID  Take 125 mcg by mouth daily before breakfast.     magnesium oxide 400 MG tablet  Commonly known as:  MAG-OX    Take 400 mg by mouth daily.     potassium chloride 10 MEQ tablet  Commonly known as:  KLOR-CON M10  Take 2 tablets (20 mEq total) by mouth 2 (two) times daily.     SYMBICORT 160-4.5 MCG/ACT inhaler  Generic drug:  budesonide-formoterol  INHALE 2 PUFFS 2 TIMES DAILY     warfarin 5 MG tablet  Commonly known as:  COUMADIN  TAKE 1 TABLET DAILY AS DIRECTED.           Follow-up Information  Follow up with HAGER, BRYAN, PA-C. (on Thursday for follow up, the office will call with time)    Specialty:  Physician Assistant   Contact information:   [address] [place] [address] [phone]       Follow up with Tommy Medal, [place] On 01/02/2014. (for coumadin check the office will call with time)    Contact information:   [address] [place] [address] [phone]        Discharge Instructions Weigh daily Call [phone] if weight climbs more than 2 pounds in a day or 5 pounds in a week. No salt to very little salt in your diet.  No more than 2000 mg in a day. Call if increased shortness of breath or increased swelling.  Have lab -potassium checked in lab on 1st floor of our office on Thursday.  Heart Healthy and little salt diet.  Hold metolazone until you are seen in the office.  Weigh today on arrival home and keep daily record.  Weight here 161.1 pounds.  We will check you on Thursday, call if any questions or problems.      Signed: Isaiah Serge Nurse Practitioner-Certified [facility]: [facility] 12/31/2013, 11:39 AM  Time spent on discharge : 40 minutes going over med changes.  I have examined the patient and reviewed assessment and plan and discussed with patient.  Agree with above as stated.  Would discharge on home dose of Lasix. He was also taking metolazone 3 days a week at home. Hold this until he is seen in the office later this week (transition of Care) given his hypokalemia. Will need INR check  and visit in the office later this week along with check of electrolytes, BMP. Continue home potassium supplementation.  Patient needs to check daily weights. If his weight goes up more than 2 pounds in a day, he should contact the office. He will likely be told to take a metolazone tablet.   Shlomie Romig S.

## 2014-01-01 ENCOUNTER — Ambulatory Visit: Payer: Medicare Other | Admitting: Cardiology

## 2014-01-02 ENCOUNTER — Ambulatory Visit (INDEPENDENT_AMBULATORY_CARE_PROVIDER_SITE_OTHER): Payer: Medicare Other | Admitting: Physician Assistant

## 2014-01-02 ENCOUNTER — Encounter: Payer: Self-pay | Admitting: Physician Assistant

## 2014-01-02 ENCOUNTER — Ambulatory Visit (INDEPENDENT_AMBULATORY_CARE_PROVIDER_SITE_OTHER): Payer: Medicare Other | Admitting: Pharmacist Clinician (PhC)/ Clinical Pharmacy Specialist

## 2014-01-02 VITALS — BP 128/62 | HR 80 | Ht 71.0 in | Wt 168.9 lb

## 2014-01-02 DIAGNOSIS — I5043 Acute on chronic combined systolic (congestive) and diastolic (congestive) heart failure: Secondary | ICD-10-CM

## 2014-01-02 DIAGNOSIS — I5022 Chronic systolic (congestive) heart failure: Secondary | ICD-10-CM

## 2014-01-02 DIAGNOSIS — Z7901 Long term (current) use of anticoagulants: Secondary | ICD-10-CM

## 2014-01-02 DIAGNOSIS — Z8679 Personal history of other diseases of the circulatory system: Secondary | ICD-10-CM

## 2014-01-02 DIAGNOSIS — R0600 Dyspnea, unspecified: Secondary | ICD-10-CM

## 2014-01-02 DIAGNOSIS — I2510 Atherosclerotic heart disease of native coronary artery without angina pectoris: Secondary | ICD-10-CM

## 2014-01-02 DIAGNOSIS — R0989 Other specified symptoms and signs involving the circulatory and respiratory systems: Secondary | ICD-10-CM

## 2014-01-02 DIAGNOSIS — R0609 Other forms of dyspnea: Secondary | ICD-10-CM

## 2014-01-02 DIAGNOSIS — I4891 Unspecified atrial fibrillation: Secondary | ICD-10-CM

## 2014-01-02 LAB — BASIC METABOLIC PANEL
BUN: 54 mg/dL — ABNORMAL HIGH (ref 6–23)
CALCIUM: 8.4 mg/dL (ref 8.4–10.5)
CO2: 28 meq/L (ref 19–32)
CREATININE: 1.61 mg/dL — AB (ref 0.50–1.35)
Chloride: 97 mEq/L (ref 96–112)
Glucose, Bld: 104 mg/dL — ABNORMAL HIGH (ref 70–99)
Potassium: 4.6 mEq/L (ref 3.5–5.3)
Sodium: 135 mEq/L (ref 135–145)

## 2014-01-02 LAB — POCT INR: INR: 2.8

## 2014-01-02 NOTE — Assessment & Plan Note (Signed)
Patient appears to be gaining back some volume.  JVD appears distended with some lower extremity edema which he did not have that discharge. Would add back metolazone every other day. I discussed this with his daughter who handles his medications. She'll monitor his weight closely and when he decreases back to baseline she'll hold the metolazone.  Add it back back as needed.

## 2014-01-02 NOTE — Progress Notes (Signed)
Date:  01/02/2014   ID:  [name], DOB 1926-08-06, MRN [id number]  PCP:  Elby Showers, MD  Primary Cardiologist:  [name]    History of Present Illness:  [name] is a 80 y.o. male admitted 12/26/13 from the office with considerable lower extremity edema and hypervolemia.  His electrocardiogram showed biventricular paced rhythm on a background of permanent atrial fibrillation and the upright V1 QRS complex suggests that the left ventricular lead is active. However the QRS duration is long at 186 ms. He has underlying complete heart block and is pacemaker dependent.  He has ischemic cardiomyopathy with a left ventricular ejection fraction around 35% after undergoing bypass surgery in 1995. He had a biventricular device but developed pocket erosion roughly a year after generator change and had to be explanted. He had a "temporary permanent" ventricular pacemaker placed percutaneously for a few weeks. This was complicated by deep venous thrombosis involving the left internal jugular and subclavian vein. A new CRT pacemaker was implanted but during the interval between the 2 devices he developed fairly significant and hard to manage congestive heart failure. He seemed to recover well after the new device was implanted, but seem to require substantially higher doses of diuretics to maintain euvolemia.  He was admitted and found to have hypokalemia at 2.9. K+ was replaced. It has been an ongoing problem replacing. At d/c K+ is 3.8.  Metolazone was held at discharge.  He was diuresed (-5421 ml total for hospitalization) . Wt at discharge 161.1 down from 167.1 lbs. ProBNP at discharge 1900 -I only have BNP to compare 553 on admit. His edema has resolved.   Pt did have echo guided AV optimization 12/27/13 with his BiV pacer.   Patient presents today for post hospital evaluation.  His daughter reports that he is up 0.8 pounds later from yesterday however he has had some increase in lower extremity  edema and she noticed that his neck veins were distended.  Patient doesn't appear to be complaining of shortness of breath orthopnea.  He may not be 100% reliable however.  The patient currently denies nausea, vomiting, fever, chest pain, dizziness, PND, cough, congestion, abdominal pain, hematochezia, melena, lower extremity edema, claudication.  Wt Readings from Last 3 Encounters:  01/02/14 168 lb 14.4 oz (76.613 kg)  12/31/13 161 lb 1.6 oz (73.074 kg)  12/25/13 175 lb 9.6 oz (79.652 kg)     Past Medical History  Diagnosis Date  . Ischemic cardiomyopathy     EF-35-45%by echo in 2011; first dx in the 90's but again in 2009 & rec'd 1st BiV ICD   . Paroxysmal atrial fibrillation     sinus rhythm on amiodarone  . Asbestos exposure     plaques on CT  . Compression fracture     T7  . Hypertension   . Coronary artery disease     last cath 2009, hx CABG  . Automatic implantable cardioverter-defibrillator in situ     now explanted  . Hypothyroidism   . CHF (congestive heart failure)     hx.  . Myocardial infarction     INFERIOR [phone] wth PTCA; Ant MI 1995 wth PTCA   . Shortness of breath     "only when external pacemaker wasn't working right" (06/17/2013)  . Arthritis     "a touch all over" (06/17/2013)  . COPD (chronic obstructive pulmonary disease)     "a touch" (8/4//2014)  . Atrial fibrillation, permanent 07/22/2013  . Biventricular cardiac  pacemaker in situ 06/2013    St. Jude Allura (pt no longer wished for ICD portion)  . Pacemaker     Current Outpatient Prescriptions  Medication Sig Dispense Refill  . albuterol (PROVENTIL HFA;VENTOLIN HFA) 108 (90 BASE) MCG/ACT inhaler Inhale 2 puffs into the lungs every 6 (six) hours as needed for wheezing or shortness of breath.      . bimatoprost (LUMIGAN) 0.03 % ophthalmic solution Place 1 drop into both eyes at bedtime.       . captopril (CAPOTEN) 25 MG tablet Take 0.5 tablets (12.5 mg total) by mouth 2 (two) times daily.  90 tablet  2    . digoxin (LANOXIN) 0.125 MG tablet Take 125 mcg by mouth at bedtime.      . dorzolamide-timolol (COSOPT) 22.3-6.8 MG/ML ophthalmic solution Place 1 drop into both eyes 2 (two) times daily.       . furosemide (LASIX) 80 MG tablet Take one tablet (80mg ) in the AM and 1/2 tablet (40mg ) in the afternoon.  135 tablet  3  . levothyroxine (SYNTHROID, LEVOTHROID) 125 MCG tablet Take 125 mcg by mouth daily before breakfast.      . magnesium oxide (MAG-OX) 400 MG tablet Take 400 mg by mouth daily.      . potassium chloride (KLOR-CON M10) 10 MEQ tablet Take 2 tablets (20 mEq total) by mouth 2 (two) times daily.  180 tablet  3  . SYMBICORT 160-4.5 MCG/ACT inhaler INHALE 2 PUFFS 2 TIMES DAILY  10.2 g  3  . warfarin (COUMADIN) 5 MG tablet TAKE 1 TABLET DAILY AS DIRECTED.  90 tablet  1   No current facility-administered medications for this visit.    Allergies:   No Known Allergies  Social History:  The patient  reports that he has never smoked. He has never used smokeless tobacco. He reports that he drinks about 8.4 ounces of alcohol per week. He reports that he does not use illicit drugs.   Family history:   Family History  Problem Relation Age of Onset  . Heart disease Father     ROS:  Please see the history of present illness.  All other systems reviewed and negative.   PHYSICAL EXAM: VS:  BP 128/62  Pulse 80  Ht 5\' 11"  (1.803 m)  Wt 168 lb 14.4 oz (76.613 kg)  BMI 23.57 kg/m2 Well nourished, well developed, in no acute distress HEENT: Pupils are equal round react to light accommodation extraocular movements are intact.  Neck:+ JVDNo cervical lymphadenopathy. Cardiac: Regular rate and rhythm without murmurs rubs or gallops.Split S2. Lungs:  clear to auscultation bilaterally, no wheezing, rhonchi or rales Abd: soft, nontender, positive bowel sounds all quadrants, no hepatosplenomegaly Ext:1+ lower extremity edema.  2+ radial and dorsalis pedis pulses. Skin: warm and dry Neuro:  Grossly  normal     ASSESSMENT AND PLAN:  Problem List Items Addressed This Visit   History of hypertension (Chronic)     Blood pressure is well-controlled at this time    Long term (current) use of anticoagulants   Acute on chronic combined systolic and diastolic heart failure     Patient appears to be gaining back some volume.  JVD appears distended with some lower extremity edema which he did not have that discharge. Would add back metolazone every other day. I discussed this with his daughter who handles his medications. She'll monitor his weight closely and when he decreases back to baseline she'll hold the metolazone.  Add it back back as  needed.     Other Visit Diagnoses   Chronic systolic heart failure    -  Primary    Relevant Orders       Basic Metabolic Panel (BMET)    Dyspnea        Relevant Orders       B Nat Peptide

## 2014-01-02 NOTE — Assessment & Plan Note (Signed)
Blood pressure is well-controlled at this time. 

## 2014-01-02 NOTE — Patient Instructions (Signed)
1.  Labs today:  BMET/BNP 2.  Add metolazone 5mg  every other day 30 minutes prior to AM lasix dose. 3.  Continue to monitor weight.  When weight decreases, you can hold the metolazone and add it back when needed. 4.  Follow up with Dr. Loletha Grayer in one month.

## 2014-01-03 ENCOUNTER — Telehealth: Payer: Self-pay | Admitting: Physician Assistant

## 2014-01-03 ENCOUNTER — Other Ambulatory Visit: Payer: Self-pay | Admitting: Physician Assistant

## 2014-01-03 DIAGNOSIS — N179 Acute kidney failure, unspecified: Secondary | ICD-10-CM

## 2014-01-03 LAB — BRAIN NATRIURETIC PEPTIDE: BRAIN NATRIURETIC PEPTIDE: 427.5 pg/mL — AB (ref 0.0–100.0)

## 2014-01-03 NOTE — Telephone Encounter (Signed)
Dr. patient's daughter regarding worsening serum creatinine,  and the fact that his BNP was actually lower than previously checked.  We'll hold off on the metolazone on forward and reassess based on weight changes.  She did report that he is becoming more confused lately and is concerned about his ability to take care of himself he goes back home alone.  Recheck basic metabolic panel 2 weeks prior to come and visit.  Tarri Fuller PAC

## 2014-01-07 ENCOUNTER — Ambulatory Visit (HOSPITAL_COMMUNITY): Payer: Medicare Other

## 2014-01-08 ENCOUNTER — Emergency Department (INDEPENDENT_AMBULATORY_CARE_PROVIDER_SITE_OTHER): Payer: Medicare Other

## 2014-01-08 ENCOUNTER — Encounter: Payer: Self-pay | Admitting: Emergency Medicine

## 2014-01-08 ENCOUNTER — Telehealth: Payer: Self-pay | Admitting: *Deleted

## 2014-01-08 ENCOUNTER — Telehealth: Payer: Self-pay | Admitting: Cardiovascular Disease

## 2014-01-08 ENCOUNTER — Emergency Department: Payer: Medicare Other

## 2014-01-08 ENCOUNTER — Emergency Department (INDEPENDENT_AMBULATORY_CARE_PROVIDER_SITE_OTHER)
Admission: EM | Admit: 2014-01-08 | Discharge: 2014-01-08 | Disposition: A | Discharge status: Home / Self Care | Payer: Medicare Other | Source: Home / Self Care | Attending: Family Medicine | Admitting: Family Medicine

## 2014-01-08 DIAGNOSIS — D649 Anemia, unspecified: Secondary | ICD-10-CM

## 2014-01-08 DIAGNOSIS — R059 Cough, unspecified: Secondary | ICD-10-CM

## 2014-01-08 DIAGNOSIS — R05 Cough: Secondary | ICD-10-CM

## 2014-01-08 DIAGNOSIS — R091 Pleurisy: Secondary | ICD-10-CM

## 2014-01-08 DIAGNOSIS — I5023 Acute on chronic systolic (congestive) heart failure: Secondary | ICD-10-CM

## 2014-01-08 DIAGNOSIS — R053 Chronic cough: Secondary | ICD-10-CM

## 2014-01-08 LAB — POCT CBC W AUTO DIFF (K'VILLE URGENT CARE)

## 2014-01-08 MED ORDER — BENZONATATE 200 MG PO CAPS: 200.0000 mg | ORAL_CAPSULE | Freq: Every day | ORAL | Status: DC

## 2014-01-08 NOTE — ED Provider Notes (Signed)
CSN: [id number]     Arrival date & time 01/08/14  1230 History   First MD Initiated Contact with Patient 01/08/14 1326     Chief Complaint  Patient presents with  . Cough  . Wheezing        HPI Comments: Patient presents with his daughter complaining of a non-productive cough for one week without shortness of breath, pleuritic pain, or fever. He has a history of chronic combined systolic and diastolic heart failure secondary to ischemic cardiomyopathy, permanent atrial fibrillation, and pacemaker dependent complete heart block. He was discharged from [facility] one week ago after evaluation/treatment of heart failure exacerbation.  His discharge weight was 161.1 pounds.  His serum potassium at admission was 2.9, corrected to 3.8 at discharge.  His ProBNP at discharge was 1900 (previously 553). His daughter has been monitoring daily weights and reports that his weight has been stable over the past week (ranging from 159 to 161).  She reports that he has had no change in lower leg edema.  He has been taking Mucinex for his cough without improvement.   He denies other URI symptoms such as sore throat, increased nasal congestion, myalgias, etc.  The history is provided by the patient and a relative.    Past Medical History  Diagnosis Date  . Ischemic cardiomyopathy     EF-35-45%by echo in 2011; first dx in the 90's but again in 2009 & rec'd 1st BiV ICD   . Paroxysmal atrial fibrillation     sinus rhythm on amiodarone  . Asbestos exposure     plaques on CT  . Compression fracture     T7  . Hypertension   . Coronary artery disease     last cath 2009, hx CABG  . Automatic implantable cardioverter-defibrillator in situ     now explanted  . Hypothyroidism   . CHF (congestive heart failure)     hx.  . Myocardial infarction     INFERIOR [phone] wth PTCA; Ant MI 1995 wth PTCA   . Shortness of breath     "only when external pacemaker wasn't working right" (06/17/2013)  . Arthritis      "a touch all over" (06/17/2013)  . COPD (chronic obstructive pulmonary disease)     "a touch" (8/4//2014)  . Atrial fibrillation, permanent 07/22/2013  . Biventricular cardiac pacemaker in situ 06/2013    St. Jude Allura (pt no longer wished for ICD portion)  . Pacemaker    Past Surgical History  Procedure Laterality Date  . Inguinal hernia repair Right   . Coronary artery bypass graft  11/01/1994    SVG to first diagonal, to distal RCA, to the ramus intermedius artery, and LIMA the LAD  . 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 Cataract extraction w/ intraocular lens  implant, bilateral Bilateral   . Icd lead removal Left 05/27/2013    Procedure: ICD LEAD REMOVAL;  Surgeon: Evans Lance, MD;  Location: [place];  Service: Cardiovascular;  Laterality: Left;  . Bi-ventricular pacemaker insertion (crt-p) Right 06/17/2013    St Jude  . Pacemaker removal Left 05/2013  . Coronary angioplasty with stent placement  12/17/2001    to VG-diag wth Zeta stent  . Cardiac catheterization  2009    Patent LIMA-LAD;  patent VG-diag wth mild in-stent restenosis; patent VG-ramus intemedius; patent VG-RCA wth 70% mid post lat stenosis  . Cardiac catheterization  03/16/2006    patent LIMA, patent VGs;  EF 30%  . Cardioversion  10/12/2010    successful cardioversion  . Coronary angioplasty  [phone]    LAD;Inf MI PTCA-RCA; ant MI PTCA- LAD   Family History  Problem Relation Age of Onset  . Heart disease Father    History  Substance Use Topics  . Smoking status: Never Smoker   . Smokeless tobacco: Never Used  . Alcohol Use: 8.4 oz/week    14 Glasses of wine per week     Comment: 01/08/14- 4 glasses of wine qwk    Review of Systems No sore throat + cough No PND No increase in edema No pleuritic  pain + wheezing (by daughter's history) No nasal congestion No post-nasal drainage No sinus pain/pressure No itchy/red eyes No earache No hemoptysis No SOB No fever/chills No change in appetitie No nausea No vomiting No abdominal pain No diarrhea No urinary symptoms No skin rash No increase in fatigue No myalgias No headache Used OTC meds without relief     Allergies  Review of patient's allergies indicates no known allergies.  Home Medications   Current Outpatient Rx  Name  Route  Sig  Dispense  Refill  . albuterol (PROVENTIL HFA;VENTOLIN HFA) 108 (90 BASE) MCG/ACT inhaler   Inhalation   Inhale 2 puffs into the lungs every 6 (six) hours as needed for wheezing or shortness of breath.         . benzonatate (TESSALON) 200 MG capsule   Oral   Take 1 capsule (200 mg total) by mouth at bedtime. Take as needed for cough   12 capsule   1   . bimatoprost (LUMIGAN) 0.03 % ophthalmic solution   Both Eyes   Place 1 drop into both eyes at bedtime.          . captopril (CAPOTEN) 25 MG tablet   Oral   Take 0.5 tablets (12.5 mg total) by mouth 2 (two) times daily.   90 tablet   2   . digoxin (LANOXIN) 0.125 MG tablet   Oral   Take 125 mcg by mouth at bedtime.         . dorzolamide-timolol (COSOPT) 22.3-6.8 MG/ML ophthalmic solution   Both Eyes   Place 1 drop into both eyes 2 (two) times daily.          . furosemide (LASIX) 80 MG tablet      Take one tablet (80mg ) in the AM and 1/2 tablet (40mg ) in the afternoon.   135 tablet   3   . levothyroxine (SYNTHROID, LEVOTHROID) 125 MCG tablet   Oral   Take 125 mcg by mouth daily before breakfast.         . magnesium oxide (MAG-OX) 400 MG tablet   Oral   Take 400 mg by mouth daily.         . potassium chloride (KLOR-CON M10) 10 MEQ tablet   Oral   Take 2 tablets (20 mEq total) by mouth 2 (two) times daily.   180 tablet   3   . SYMBICORT 160-4.5 MCG/ACT inhaler      INHALE 2 PUFFS 2 TIMES DAILY    10.2 g   3   . warfarin (COUMADIN) 5 MG tablet      TAKE 1 TABLET DAILY AS DIRECTED.   90 tablet   1  BP 91/52  Pulse 74  Temp(Src) 96.4 F (35.8 C) (Oral)  Resp 18  Wt 166 lb (75.297 kg)  SpO2 100% Physical Exam Nursing notes and Vital Signs reviewed. Appearance:  Patient appears younger than stated age, and in no acute distress.  He is alert and oriented.  Eyes:  Pupils are equal, round, and reactive to light and accomodation.  Extraocular movement is intact.  Conjunctivae are not inflamed  Ears:  Canals normal.  Tympanic membranes normal.  Nose:  Normal turbinates.  No sinus tenderness.   Pharynx:  Normal; moist mucous membranes  Neck:  Supple.  No adenopathy.  JVD is present but daughter reports that it is significantly less than at the time of his recent hospital admission. Lungs:  Clear to auscultation except for very faint wheeze right posterior base.  No rales or rhonchi.  Breath sounds are equal.  Heart:  Generally regular rhythm, rate 74 with 2/6 SEM, no rubs or gallops.  Abdomen:  Nontender without masses or hepatosplenomegaly.  Bowel sounds are present.  No CVA or flank tenderness.  Extremities:  Trace lower leg and ankle edema.  No calf tenderness Skin:  No rash present.   ED Course  Procedures  None  Labs Review Labs Reviewed  BRAIN NATRIURETIC PEPTIDE - Abnormal; Notable for the following:    Brain Natriuretic Peptide 354.5 (*)       Narrative:    Performed at:  [facility]                [address], [address]                [place], [place] [address]  BASIC METABOLIC PANEL WITH GFR - Abnormal; Notable for the following:    Sodium 134 (*)    Chloride 94 (*)    Glucose, Bld 152 (*)    BUN 42 (*)    Creat 1.41 (*)    GFR, Est African American 52 (*)    GFR, Est Non African American 45 (*)    All other components within normal limits   Narrative:    Performed at:  [address], [address]                 [place], [place] [address]  POCT CBC W AUTO DIFF ([facility]) WBC 4.9; LY 19.6; MO 5.6; GR 74.8; Hgb 8.9; Platelets 158    Imaging Review     DG Chest 1V REPEAT Same Day (Final result)  Result time: 01/08/14 14:57:01    Procedure changed from [facility] Chest 1 View       Final result by Rad Results In Interface (01/08/14 14:57:01)    Narrative:   CLINICAL DATA: Question pulmonary nodule in the right lower lobe. Repeat with nipple markers.  EXAM: CHEST - 1 VIEW SAME DAY  COMPARISON: Chest x-ray from the same date at 1:52 p.m.  FINDINGS: Stable cardiomegaly. Previous median sternotomy for CABG. Biventricular pacer without atrial lead. No edema or consolidation. Previously noted pulmonary nodule is an nipple shadow, as confirmed with nipple markers. There bilateral pleural plaques, better seen on the left were there is more calcification. No edema or consolidation.  IMPRESSION: 1. No pulmonary nodule. Previously described right lower lobe density is a nipple shadow. 2. Calcified pleural plaques, usually asbestos related.   Electronically Signed By: Jorje Guild M.D. On: 01/08/2014 14:57  MDM   Final diagnoses:  Persistent dry cough.  Does not appear to be exacerbation of CHF.  No evidence infectious process at this time.  ?ACEI induced cough (less likely:  Patient has been on captopril for quite some time)  Anemia;  Increased anemia, although relatively stable, since 10/08/13.        Hgb 11.7 on 10/08/13; Hgb 9.1 on 12/25/13; Hgb 9.7 on 12/29/13; Hgb 8.9 on 01/08/14    Will treat symptomatically at present:  Try Delsym at bedtime; if not effective try Tessalon at bedtime.  Consider using albuterol inhaler at bedtime.   Advised daughter to continue monitoring daily weights. Followup with PCP and cardiologist as scheduled  Kandra Nicolas, MD 01/13/14 1217

## 2014-01-08 NOTE — Telephone Encounter (Signed)
Aaron Edelman please address

## 2014-01-08 NOTE — Discharge Instructions (Signed)
Try Delsym at bedtime for cough.  If not effective, begin Tessalon Perle at bedtime. Continue to monitor weights.

## 2014-01-08 NOTE — Telephone Encounter (Signed)
Pt's daughter called and wanted to find out if he can come in and get looked at. He was last week with Gaspar Bidding. [name] has a crackling and wheezing cough that he can not get rid of and she is concerned.   [place]

## 2014-01-08 NOTE — Telephone Encounter (Signed)
Gaspar Bidding has already addressed upon return call

## 2014-01-08 NOTE — Telephone Encounter (Signed)
Per answering service -Pt has a ereally bad cough.He wants to be seen today please.

## 2014-01-08 NOTE — Telephone Encounter (Signed)
Faxed [facility] order - they have been having problems reaching him.  Will continue their effort for several more days.

## 2014-01-08 NOTE — Telephone Encounter (Signed)
I spoke to the patient's daughter [name].  The patient has been staying with her and she's been helping with his medications. She reports a wet crackly cough and some shortness of breath. His weight has come down to 158 to 160 pounds with very little if any lower extremity edema.  His last serum creatinine was elevated a week ago 1.61 with a normal potassium. I spoke to her on the phone at that point and had her hold the metolazone.  She was wondering if she should bring him in for evaluation.  Recommended she take him to urgent care as it does not sound like he is in acute heart failure exacerbation.  Burk Hoctor, PAC 11:42 AM

## 2014-01-08 NOTE — ED Notes (Signed)
Pt's daughter reports that he has had a nonproductive cough x 7 days. Denies fever. She reports hearing wheezing and rattling. She has given him mucinex. She reports he was in the hospital recently for CHF and was d/c 8 days ago. She reports that his weights have been stable at home.

## 2014-01-09 LAB — BRAIN NATRIURETIC PEPTIDE: Brain Natriuretic Peptide: 354.5 pg/mL — ABNORMAL HIGH (ref 0.0–100.0)

## 2014-01-09 LAB — BASIC METABOLIC PANEL WITH GFR
BUN: 42 mg/dL — ABNORMAL HIGH (ref 6–23)
CHLORIDE: 94 meq/L — AB (ref 96–112)
CO2: 30 meq/L (ref 19–32)
Calcium: 8.4 mg/dL (ref 8.4–10.5)
Creat: 1.41 mg/dL — ABNORMAL HIGH (ref 0.50–1.35)
GFR, Est African American: 52 mL/min — ABNORMAL LOW
GFR, Est Non African American: 45 mL/min — ABNORMAL LOW
Glucose, Bld: 152 mg/dL — ABNORMAL HIGH (ref 70–99)
Potassium: 3.5 mEq/L (ref 3.5–5.3)
SODIUM: 134 meq/L — AB (ref 135–145)

## 2014-01-14 ENCOUNTER — Telehealth: Payer: Self-pay | Admitting: *Deleted

## 2014-01-14 ENCOUNTER — Ambulatory Visit (INDEPENDENT_AMBULATORY_CARE_PROVIDER_SITE_OTHER): Payer: Medicare Other | Admitting: Physician Assistant

## 2014-01-14 ENCOUNTER — Encounter: Payer: Self-pay | Admitting: Physician Assistant

## 2014-01-14 ENCOUNTER — Ambulatory Visit (INDEPENDENT_AMBULATORY_CARE_PROVIDER_SITE_OTHER): Payer: Medicare Other | Admitting: Pharmacist Clinician (PhC)/ Clinical Pharmacy Specialist

## 2014-01-14 VITALS — BP 100/60 | HR 80 | Ht 71.0 in | Wt 168.0 lb

## 2014-01-14 DIAGNOSIS — Z7901 Long term (current) use of anticoagulants: Secondary | ICD-10-CM

## 2014-01-14 DIAGNOSIS — I4821 Permanent atrial fibrillation: Secondary | ICD-10-CM

## 2014-01-14 DIAGNOSIS — D649 Anemia, unspecified: Secondary | ICD-10-CM

## 2014-01-14 DIAGNOSIS — I5043 Acute on chronic combined systolic (congestive) and diastolic (congestive) heart failure: Secondary | ICD-10-CM

## 2014-01-14 DIAGNOSIS — I2510 Atherosclerotic heart disease of native coronary artery without angina pectoris: Secondary | ICD-10-CM

## 2014-01-14 DIAGNOSIS — I4891 Unspecified atrial fibrillation: Secondary | ICD-10-CM

## 2014-01-14 LAB — CBC
HEMATOCRIT: 27.7 % — AB (ref 39.0–52.0)
HEMOGLOBIN: 8.8 g/dL — AB (ref 13.0–17.0)
MCH: 26.2 pg (ref 26.0–34.0)
MCHC: 31.8 g/dL (ref 30.0–36.0)
MCV: 82.4 fL (ref 78.0–100.0)
Platelets: 169 10*3/uL (ref 150–400)
RBC: 3.36 MIL/uL — ABNORMAL LOW (ref 4.22–5.81)
RDW: 17.1 % — ABNORMAL HIGH (ref 11.5–15.5)
WBC: 5.4 10*3/uL (ref 4.0–10.5)

## 2014-01-14 LAB — POCT INR: INR: 3.1

## 2014-01-14 NOTE — Patient Instructions (Addendum)
1.  CBC today. 2.  Stop captopril 3.  Add metolazone tomorrow morning.  Take an extra 40mg  of lasix tonight.  Monitor for improvement in symptoms.  If still symptomatic, take metolazone again on Thursday.   4.  Follow up next week.

## 2014-01-14 NOTE — Telephone Encounter (Signed)
Iona unable to reach Mr Korver to set up home care.  Requested she call back to advise.

## 2014-01-14 NOTE — Assessment & Plan Note (Signed)
Rate controlled.  Digoxin.

## 2014-01-14 NOTE — Progress Notes (Signed)
Date:  01/14/2014   ID:  [name], DOB 06-01-1926, MRN [id number]  PCP:  Elby Showers, MD  Primary Cardiologist:  [name]     History of Present Illness: [name] is a 80 y.o. male admitted 12/26/13 from the office with considerable lower extremity edema and hypervolemia. His electrocardiogram showed biventricular paced rhythm on a background of permanent atrial fibrillation and the upright V1 QRS complex suggests that the left ventricular lead is active. However the QRS duration is long at 186 ms. He has underlying complete heart block and is pacemaker dependent. He has ischemic cardiomyopathy with a left ventricular ejection fraction around 35% after undergoing bypass surgery in 1995. He had a biventricular device but developed pocket erosion roughly a year after generator change and had to be explanted. He had a "temporary permanent" ventricular pacemaker placed percutaneously for a few weeks. This was complicated by deep venous thrombosis involving the left internal jugular and subclavian vein. A new CRT pacemaker was implanted but during the interval between the 2 devices he developed fairly significant and hard to manage congestive heart failure. He seemed to recover well after the new device was implanted, but seem to require substantially higher doses of diuretics to maintain euvolemia.   He was admitted and found to have hypokalemia at 2.9. K+ was replaced. It has been an ongoing problem replacing. At d/c K+ is 3.8. Metolazone was held at discharge. He was diuresed (-5421 ml total for hospitalization) . Wt at discharge 161.1 down from 167.1 lbs. ProBNP at discharge 1900 -I only have BNP to compare 553 on admit. His edema resolved. Pt did have echo guided AV optimization 12/27/13 with his BiV pacer.    patient was seen this past weekend in urgent care due to persistent wet cough and some lower extremity edema. He was prescribed Delsym at bedtime and if not effective, was to try  Tessalon at bedtime.  Recheck a BMP at that time showed a further decrease into the 300's.  Patient presents today for followup. He is accompanied by his daughter [name] who is been watching his weight and administering his medications.  She was also present with him at last office visit. His weight has been very stable. He denies outright orthopnea does get some shortness of breath on occasion. He certainly has a moist sounding cough and lower extremity edema.  The patient currently denies nausea, vomiting, fever, chest pain, abdominal pain, hematochezia, melena, claudication.  Wt Readings from Last 3 Encounters:  01/14/14 168 lb (76.204 kg)  01/08/14 166 lb (75.297 kg)  01/02/14 168 lb 14.4 oz (76.613 kg)     Past Medical History  Diagnosis Date  . Ischemic cardiomyopathy     EF-35-45%by echo in 2011; first dx in the 90's but again in 2009 & rec'd 1st BiV ICD   . Paroxysmal atrial fibrillation     sinus rhythm on amiodarone  . Asbestos exposure     plaques on CT  . Compression fracture     T7  . Hypertension   . Coronary artery disease     last cath 2009, hx CABG  . Automatic implantable cardioverter-defibrillator in situ     now explanted  . Hypothyroidism   . CHF (congestive heart failure)     hx.  . Myocardial infarction     INFERIOR [phone] wth PTCA; Ant MI 1995 wth PTCA   . Shortness of breath     "only when external pacemaker wasn't working right" (  06/17/2013)  . Arthritis     "a touch all over" (06/17/2013)  . COPD (chronic obstructive pulmonary disease)     "a touch" (8/4//2014)  . Atrial fibrillation, permanent 07/22/2013  . Biventricular cardiac pacemaker in situ 06/2013    [employer] Allura (pt no longer wished for ICD portion)  . Pacemaker     Current Outpatient Prescriptions  Medication Sig Dispense Refill  . albuterol (PROVENTIL HFA;VENTOLIN HFA) 108 (90 BASE) MCG/ACT inhaler Inhale 2 puffs into the lungs every 6 (six) hours as needed for wheezing or shortness of  breath.      . benzonatate (TESSALON) 200 MG capsule Take 1 capsule (200 mg total) by mouth at bedtime. Take as needed for cough  12 capsule  1  . bimatoprost (LUMIGAN) 0.03 % ophthalmic solution Place 1 drop into both eyes at bedtime.       . digoxin (LANOXIN) 0.125 MG tablet Take 125 mcg by mouth at bedtime.      . dorzolamide-timolol (COSOPT) 22.3-6.8 MG/ML ophthalmic solution Place 1 drop into both eyes 2 (two) times daily.       . furosemide (LASIX) 80 MG tablet Take one tablet (80mg ) in the AM and 1/2 tablet (40mg ) in the afternoon.  135 tablet  3  . levothyroxine (SYNTHROID, LEVOTHROID) 125 MCG tablet Take 125 mcg by mouth daily before breakfast.      . magnesium oxide (MAG-OX) 400 MG tablet Take 400 mg by mouth daily.      . potassium chloride (KLOR-CON M10) 10 MEQ tablet Take 2 tablets (20 mEq total) by mouth 2 (two) times daily.  180 tablet  3  . SYMBICORT 160-4.5 MCG/ACT inhaler INHALE 2 PUFFS 2 TIMES DAILY  10.2 g  3  . warfarin (COUMADIN) 5 MG tablet TAKE 1 TABLET DAILY AS DIRECTED.  90 tablet  1   No current facility-administered medications for this visit.    Allergies:   No Known Allergies  Social History:  The patient  reports that he has never smoked. He has never used smokeless tobacco. He reports that he drinks about 8.4 ounces of alcohol per week. He reports that he does not use illicit drugs.   Family history:   Family History  Problem Relation Age of Onset  . Heart disease Father     ROS:  Please see the history of present illness.  All other systems reviewed and negative.   PHYSICAL EXAM: VS:  BP 100/60  Pulse 80  Ht 5\' 11"  (1.803 m)  Wt 168 lb (76.204 kg)  BMI 23.44 kg/m2 Well nourished, well developed, in no acute distress HEENT: Pupils are equal round react to light accommodation extraocular movements are intact.  Oral mucosa is moist and pink. No erythema, exudate, or drainage noted Neck: Positive JVDNo cervical lymphadenopathy. Cardiac: Regular rate  and rhythm without murmurs rubs or gallops. Lungs:  Bilateral rales Abd: soft, nontender, positive bowel sounds all quadrants, no hepatosplenomegaly Ext: 1+ lower extremity edema.  2+ radial and dorsalis pedis pulses. Skin: warm and dry Neuro:  Grossly normal    Problem List Items Addressed This Visit   Atrial fibrillation, permanent (Chronic)     Rate controlled.  Digoxin.    CAD- CABG X 4 '95, patent grafts '09   Acute on chronic combined systolic and diastolic heart failure     Despite the patient having a decreasing BNP based on the last 2 lab results, he still seems volume overloaded both with lower extremity edema and rales  on exam. His persistent cough sounds wet and he has no other symptoms of an upper respiratory infection.  Stop captopril.   BP is low and a higher BP may help kidney perfusion thus improving diuresis. Add metolazone tomorrow morning.  Take an extra 40mg  of lasix tonight.  Monitor for improvement in symptoms.  If still symptomatic, take metolazone again on Thursday.   Follow up next week.      Anemia - Primary     Recheck Hgb    Relevant Orders      CBC (Completed)

## 2014-01-14 NOTE — Assessment & Plan Note (Signed)
Despite the patient having a decreasing BNP based on the last 2 lab results, he still seems volume overloaded both with lower extremity edema and rales on exam. His persistent cough sounds wet and he has no symptoms of an upper respiratory infection.  Stop captopril.   BP is low and a higher BP may help kidney perfusion thus improving diuresis. Add metolazone tomorrow morning.  Take an extra 40mg  of lasix tonight.  Monitor for improvement in symptoms.  If still symptomatic, take metolazone again on Thursday.   Follow up next week.

## 2014-01-14 NOTE — Assessment & Plan Note (Signed)
Recheck Hgb

## 2014-01-15 ENCOUNTER — Telehealth: Payer: Self-pay | Admitting: *Deleted

## 2014-01-15 NOTE — Telephone Encounter (Signed)
[facility] notified to call the daughter of Mr. Dylan, [name] [phone] to set up for assessment for home health.  They will contact the patient to set up a time.

## 2014-01-16 ENCOUNTER — Telehealth: Payer: Self-pay | Admitting: *Deleted

## 2014-01-16 NOTE — Telephone Encounter (Signed)
Returned call and informed daughter per instructions by MD.  Verbalized understanding and agreed w/ plan.  Stated pt is taking Augmentin 875-125 mg tab and Prednisone 10 mg TID x 5 days.  Informed Tommy Medal, PharmD will be notified in case further instructions w/ pt's warfarin dose.  Verbalized understanding and agreed w/ plan.  [name] will be available on cell.  Message forwarded to Tommy Medal, PharmD.

## 2014-01-16 NOTE — Telephone Encounter (Signed)
Spoke with daughter, will hold warfarin today and give only 1/2 tablet tomorrow, then resume previous dose. Will repeat INR Wednesday

## 2014-01-16 NOTE — Telephone Encounter (Signed)
Returned call and pt verified x 2 w/ [name], pt's daughter.  Stated pt saw Gaspar Bidding the other day and he told them to give pt metolazone.  Concerns  Lost 4 lbs in 24 hrs   3/3 - 162 lbs  3/4 - 161.8 lbs  3/5 - 157.8 lbs (today)  Still w/ cough, little wet  Still w/ LE edema, better but still there  Gerontologist rx'd abx, augmentin for URI  Daughter informed Dr. Loletha Grayer will be notified for further instructions.  Verbalized understanding and agreed w/ plan.  Message forwarded to Dr. Sallyanne Kuster.

## 2014-01-16 NOTE — Telephone Encounter (Signed)
Please ask [name] to give [name] another dose of metolazone today, but none over the weekend

## 2014-01-16 NOTE — Telephone Encounter (Signed)
Pt's daughter called stating that [name] has lost some weight and she has questions about his Metolazone. She gave him a dose last night as directed and he has lost 4 pounds over night. She wanted to know what to do for today.  [place]

## 2014-01-17 ENCOUNTER — Ambulatory Visit: Payer: Medicare Other | Admitting: Pharmacist Clinician (PhC)/ Clinical Pharmacy Specialist

## 2014-01-22 ENCOUNTER — Ambulatory Visit (INDEPENDENT_AMBULATORY_CARE_PROVIDER_SITE_OTHER): Payer: Medicare Other | Admitting: Pharmacist Clinician (PhC)/ Clinical Pharmacy Specialist

## 2014-01-22 ENCOUNTER — Encounter: Payer: Self-pay | Admitting: Cardiology

## 2014-01-22 ENCOUNTER — Ambulatory Visit (INDEPENDENT_AMBULATORY_CARE_PROVIDER_SITE_OTHER): Payer: Medicare Other | Admitting: Cardiology

## 2014-01-22 VITALS — BP 118/62 | HR 52 | Ht 71.0 in | Wt 161.6 lb

## 2014-01-22 DIAGNOSIS — R059 Cough, unspecified: Secondary | ICD-10-CM

## 2014-01-22 DIAGNOSIS — R05 Cough: Secondary | ICD-10-CM

## 2014-01-22 DIAGNOSIS — I4891 Unspecified atrial fibrillation: Secondary | ICD-10-CM

## 2014-01-22 DIAGNOSIS — J61 Pneumoconiosis due to asbestos and other mineral fibers: Secondary | ICD-10-CM

## 2014-01-22 DIAGNOSIS — I255 Ischemic cardiomyopathy: Secondary | ICD-10-CM | POA: Insufficient documentation

## 2014-01-22 DIAGNOSIS — Z950 Presence of cardiac pacemaker: Secondary | ICD-10-CM

## 2014-01-22 DIAGNOSIS — I4821 Permanent atrial fibrillation: Secondary | ICD-10-CM

## 2014-01-22 DIAGNOSIS — I2589 Other forms of chronic ischemic heart disease: Secondary | ICD-10-CM

## 2014-01-22 DIAGNOSIS — I2510 Atherosclerotic heart disease of native coronary artery without angina pectoris: Secondary | ICD-10-CM

## 2014-01-22 DIAGNOSIS — Z7901 Long term (current) use of anticoagulants: Secondary | ICD-10-CM

## 2014-01-22 LAB — BASIC METABOLIC PANEL
BUN: 43 mg/dL — ABNORMAL HIGH (ref 6–23)
CO2: 34 mEq/L — ABNORMAL HIGH (ref 19–32)
Calcium: 8.8 mg/dL (ref 8.4–10.5)
Chloride: 91 mEq/L — ABNORMAL LOW (ref 96–112)
Creat: 1.24 mg/dL (ref 0.50–1.35)
Glucose, Bld: 151 mg/dL — ABNORMAL HIGH (ref 70–99)
Potassium: 3.1 mEq/L — ABNORMAL LOW (ref 3.5–5.3)
Sodium: 134 mEq/L — ABNORMAL LOW (ref 135–145)

## 2014-01-22 LAB — PRO B NATRIURETIC PEPTIDE: Pro B Natriuretic peptide (BNP): 3151 pg/mL — ABNORMAL HIGH (ref <451)

## 2014-01-22 LAB — POCT INR: INR: 1.7

## 2014-01-22 MED ORDER — PANTOPRAZOLE SODIUM 40 MG PO TBEC: 40.0000 mg | DELAYED_RELEASE_TABLET | Freq: Every day | ORAL | Status: DC

## 2014-01-22 NOTE — Patient Instructions (Addendum)
Your physician recommends that you have lab work Keep apt with Dr Sallyanne Kuster 3/19

## 2014-01-22 NOTE — Progress Notes (Signed)
01/22/2014 [place]   06-Apr-1926  [id number]  Primary Physicia Elby Showers, MD Primary Cardiologist: Dr Gwenlyn Found  HPI:  [name] is a 80 y.o. male admitted 12/26/13 from the office with considerable lower extremity edema and hypervolemia.  He has underlying complete heart block and is pacemaker dependent. He has ischemic cardiomyopathy after undergoing bypass surgery in 1995. His left ventricular ejection fraction was 25-30% by echo 12/27/13. He had a biventricular device implanted but developed pocket erosion roughly a year after generator change and had to be explanted July 2014. He had a "temporary permanent" ventricular pacemaker placed percutaneously for a few weeks. This was complicated by deep venous thrombosis involving the left internal jugular and subclavian vein. A new CRT pacemaker was implanted in Aug 2014 but during the interval between the 2 devices he developed fairly significant and hard to manage congestive heart failure.            He has ben back to the office twice since his discharge 12/31/13. His diuretics have been adjusted. He appears to be compensated but he still has a non productive cough. ACE was recently stopped but there has been no change. His wgt is stable.     Current Outpatient Prescriptions  Medication Sig Dispense Refill  . albuterol (PROVENTIL HFA;VENTOLIN HFA) 108 (90 BASE) MCG/ACT inhaler Inhale 2 puffs into the lungs every 6 (six) hours as needed for wheezing or shortness of breath.      . benzonatate (TESSALON) 200 MG capsule Take 1 capsule (200 mg total) by mouth at bedtime. Take as needed for cough  12 capsule  1  . bimatoprost (LUMIGAN) 0.03 % ophthalmic solution Place 1 drop into both eyes at bedtime.       . digoxin (LANOXIN) 0.125 MG tablet Take 125 mcg by mouth at bedtime.      . dorzolamide-timolol (COSOPT) 22.3-6.8 MG/ML ophthalmic solution Place 1 drop into both eyes 2 (two) times daily.       . furosemide (LASIX) 80 MG tablet Take  one tablet (80mg ) in the AM and 1/2 tablet (40mg ) in the afternoon.  135 tablet  3  . levothyroxine (SYNTHROID, LEVOTHROID) 125 MCG tablet Take 125 mcg by mouth daily before breakfast.      . magnesium oxide (MAG-OX) 400 MG tablet Take 400 mg by mouth daily.      . metolazone (ZAROXOLYN) 5 MG tablet Take 5 mg by mouth daily as needed.      . potassium chloride (KLOR-CON M10) 10 MEQ tablet Take 2 tablets (20 mEq total) by mouth 2 (two) times daily.  180 tablet  3  . SYMBICORT 160-4.5 MCG/ACT inhaler INHALE 2 PUFFS 2 TIMES DAILY  10.2 g  3  . warfarin (COUMADIN) 5 MG tablet TAKE 1 TABLET DAILY AS DIRECTED.  90 tablet  1  . pantoprazole (PROTONIX) 40 MG tablet Take 1 tablet (40 mg total) by mouth daily.  30 tablet  11   No current facility-administered medications for this visit.    No Known Allergies  History   Social History  . Marital Status: Widowed    Spouse Name: N/A    Number of Children: N/A  . 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 Smoking status: Never Smoker   . Smokeless tobacco: Never Used  . Alcohol Use: 8.4 oz/week    14 Glasses of wine per week  Comment: 01/08/14- 4 glasses of wine qwk  . Drug Use: No  . Sexual Activity: No   Other Topics Concern  . Not on file   Social History Narrative  . No narrative on file     Review of Systems: General: negative for chills, fever, night sweats or weight changes.  Cardiovascular: negative for chest pain, dyspnea on exertion, edema, orthopnea, palpitations, paroxysmal nocturnal dyspnea or shortness of breath Dermatological: negative for rash Respiratory: negative for cough or wheezing Urologic: negative for hematuria Abdominal: negative for nausea, vomiting, diarrhea, bright red blood per rectum, melena, or hematemesis Neurologic: negative for visual changes, syncope, or dizziness All other systems reviewed and are otherwise negative  except as noted above.    Blood pressure 118/62, pulse 52, height 5\' 11"  (1.803 m), weight 161 lb 9.6 oz (73.301 kg).  General appearance: alert, cooperative, cachectic and no distress Neck: no carotid bruit and no JVD Lungs: few crackles Rt base Heart: regular rate and rhythm Extremities: trace edema    ASSESSMENT AND PLAN:   Cough He still has a cough- despite compensated CHF and off ACE  Atrial fibrillation, permanent .  Biventricular cardiac pacemaker in situ .  PULMONARY ASBESTOSIS .  CAD- CABG X 4 '95, patent grafts '09 No angina  Cardiomyopathy, ischemic- EF 25-30% by 2D 12/27/13 Appears compensated    PLAN  Add PPI. Labs ordered including a pro BNP.  He has a follow up with Dr Sallyanne Kuster. I think he may need pulmonary evaluation if his cough doesn't improve with PPI.   [facility] KPA-C 01/22/2014 4:27 PM

## 2014-01-22 NOTE — Assessment & Plan Note (Signed)
He still has a cough- despite compensated CHF and off ACE

## 2014-01-22 NOTE — Assessment & Plan Note (Signed)
Appears compensated .  

## 2014-01-22 NOTE — Assessment & Plan Note (Signed)
No angina 

## 2014-01-27 ENCOUNTER — Other Ambulatory Visit: Payer: Self-pay | Admitting: Gastroenterology

## 2014-01-27 DIAGNOSIS — D649 Anemia, unspecified: Secondary | ICD-10-CM

## 2014-01-28 ENCOUNTER — Telehealth: Payer: Self-pay | Admitting: Cardiovascular Disease

## 2014-01-28 NOTE — Telephone Encounter (Signed)
Please call [name]- She says her dad received a phone call from somebody here yesterday, He was confused about what they said and she is trying to get some understanding on what was said.She think it was about his potasium.

## 2014-01-28 NOTE — Telephone Encounter (Signed)
Returned call to [place], pt's daughter.  Informed per Result Note that JC, LPN called pt w/ instructions from [facility], PA-C that pt should increase K+ to 30 mEq twice daily and f/u with Dr. Loletha Grayer on 3.19.15, which is this Thursday.  Ann verbalized understanding and agreed w/ plan.

## 2014-01-28 NOTE — Telephone Encounter (Signed)
Advised to ask for script if Dr. Loletha Grayer plans to keep pt on increased dose as he will run out early.  Verbalized understanding.

## 2014-01-30 ENCOUNTER — Ambulatory Visit: Payer: Medicare Other | Admitting: Cardiovascular Disease

## 2014-01-30 ENCOUNTER — Ambulatory Visit (INDEPENDENT_AMBULATORY_CARE_PROVIDER_SITE_OTHER): Payer: Medicare Other | Admitting: *Deleted

## 2014-01-30 ENCOUNTER — Ambulatory Visit (INDEPENDENT_AMBULATORY_CARE_PROVIDER_SITE_OTHER): Payer: Medicare Other | Admitting: Cardiovascular Disease

## 2014-01-30 ENCOUNTER — Encounter: Payer: Self-pay | Admitting: Cardiovascular Disease

## 2014-01-30 VITALS — BP 120/62 | HR 80 | Ht 71.0 in | Wt 162.0 lb

## 2014-01-30 DIAGNOSIS — Z950 Presence of cardiac pacemaker: Secondary | ICD-10-CM

## 2014-01-30 DIAGNOSIS — Z79899 Other long term (current) drug therapy: Secondary | ICD-10-CM

## 2014-01-30 DIAGNOSIS — I4821 Permanent atrial fibrillation: Secondary | ICD-10-CM

## 2014-01-30 DIAGNOSIS — R5381 Other malaise: Secondary | ICD-10-CM

## 2014-01-30 DIAGNOSIS — I509 Heart failure, unspecified: Secondary | ICD-10-CM

## 2014-01-30 DIAGNOSIS — I4891 Unspecified atrial fibrillation: Secondary | ICD-10-CM

## 2014-01-30 DIAGNOSIS — M109 Gout, unspecified: Secondary | ICD-10-CM

## 2014-01-30 DIAGNOSIS — I2589 Other forms of chronic ischemic heart disease: Secondary | ICD-10-CM

## 2014-01-30 DIAGNOSIS — R5383 Other fatigue: Secondary | ICD-10-CM

## 2014-01-30 DIAGNOSIS — I2510 Atherosclerotic heart disease of native coronary artery without angina pectoris: Secondary | ICD-10-CM

## 2014-01-30 DIAGNOSIS — Z7901 Long term (current) use of anticoagulants: Secondary | ICD-10-CM

## 2014-01-30 DIAGNOSIS — I5042 Chronic combined systolic (congestive) and diastolic (congestive) heart failure: Secondary | ICD-10-CM

## 2014-01-30 DIAGNOSIS — I5043 Acute on chronic combined systolic (congestive) and diastolic (congestive) heart failure: Secondary | ICD-10-CM

## 2014-01-30 DIAGNOSIS — I255 Ischemic cardiomyopathy: Secondary | ICD-10-CM

## 2014-01-30 LAB — CBC
HCT: 31 % — ABNORMAL LOW (ref 39.0–52.0)
HEMOGLOBIN: 10.1 g/dL — AB (ref 13.0–17.0)
MCH: 26 pg (ref 26.0–34.0)
MCHC: 32.6 g/dL (ref 30.0–36.0)
MCV: 79.9 fL (ref 78.0–100.0)
Platelets: 177 10*3/uL (ref 150–400)
RBC: 3.88 MIL/uL — ABNORMAL LOW (ref 4.22–5.81)
RDW: 17.9 % — ABNORMAL HIGH (ref 11.5–15.5)
WBC: 5.2 10*3/uL (ref 4.0–10.5)

## 2014-01-30 LAB — MDC_IDC_ENUM_SESS_TYPE_INCLINIC
Battery Remaining Longevity: 39.6 mo
Battery Voltage: 2.95 V
Brady Statistic RA Percent Paced: 0 %
Brady Statistic RV Percent Paced: 98 %
Date Time Interrogation Session: 20150319201734
Implantable Pulse Generator Model: 3242
Implantable Pulse Generator Serial Number: 7515957
Lead Channel Impedance Value: 450 Ohm
Lead Channel Pacing Threshold Amplitude: 0.75 V
Lead Channel Pacing Threshold Amplitude: 0.75 V
Lead Channel Pacing Threshold Amplitude: 1.5 V
Lead Channel Pacing Threshold Pulse Width: 0.4 ms
Lead Channel Pacing Threshold Pulse Width: 0.4 ms
Lead Channel Pacing Threshold Pulse Width: 1 ms
Lead Channel Pacing Threshold Pulse Width: 1 ms
Lead Channel Sensing Intrinsic Amplitude: 12 mV
Lead Channel Setting Pacing Amplitude: 2.5 V
Lead Channel Setting Pacing Pulse Width: 0.4 ms
MDC IDC MSMT LEADCHNL LV IMPEDANCE VALUE: 550 Ohm
MDC IDC MSMT LEADCHNL LV PACING THRESHOLD AMPLITUDE: 1.5 V
MDC IDC SET LEADCHNL LV PACING AMPLITUDE: 3 V
MDC IDC SET LEADCHNL LV PACING PULSEWIDTH: 1 ms
MDC IDC SET LEADCHNL RV SENSING SENSITIVITY: 6 mV

## 2014-01-30 LAB — POCT INR: INR: 2.7

## 2014-01-30 LAB — PACEMAKER DEVICE OBSERVATION

## 2014-01-30 MED ORDER — CEPHALEXIN 500 MG PO CAPS: 500.0000 mg | ORAL_CAPSULE | Freq: Four times a day (QID) | ORAL | Status: DC

## 2014-01-30 MED ORDER — COLCHICINE 0.6 MG PO TABS: 0.6000 mg | ORAL_TABLET | Freq: Every day | ORAL | Status: DC

## 2014-01-30 MED ORDER — POTASSIUM CHLORIDE CRYS ER 10 MEQ PO TBCR: 30.0000 meq | EXTENDED_RELEASE_TABLET | Freq: Once | ORAL | Status: DC

## 2014-01-30 NOTE — Assessment & Plan Note (Signed)
Risk of stroke and other embolic complications is high due to the presence of severe cardiomyopathy. On the other hand he appears to be having GI bleeding. May need to interrupt the warfarin at least temporarily. For the time being, until we have clear plans for invasive procedures, will reduce the warfarin dose to keep his INR around 2.0.

## 2014-01-30 NOTE — Assessment & Plan Note (Signed)
The appearance of the great toe is highly consistent with a gout attack and he has recently received enhanced diuretic therapy including a thiazide which could well have triggered this. I gave him a short course of colchicine. On the other hand the redness occurred almost immediately following a pedicure and there is a possibility that this could be infection. Since [name] has been so ill, I don't think we should take any chances I prescribed a short course of cephalosporins as well.

## 2014-01-30 NOTE — Progress Notes (Signed)
Patient ID: [name], male   DOB: 1926/07/14, 80 y.o.   MRN: [id number]      Reason for office visit Heart failure, CAD, atrial fibrillation, biventricular pacemaker  Overall [name] is doing substantially better. His family has been monitoring his weight very closely. He seems to do best at a "dry weight" of 155-156 pounds on his home scale (our office scale seems to estimate his weight roughly 5 pounds higher). He has only required one prn dose of metolazone 2.5 mg since the recent increment in diuretic therapy. He has not had any chest pain and after stopping the captopril no longer has dizziness or weakness. He denies orthopnea or PND. His cough resolved after starting a proton pump inhibitor.  Yesterday he went for a pedicure and today has developed fairly severe redness and mild tenderness over the first right metatarsophalangeal joint. He has never had gout before. The site is tender but not exquisitely so. There is slight swelling of the toe itself but there is no evidence of redness or purulence of the toenail or surrounding soft tissues. He has not had fever or chills.  He has moderate anemia (hemoglobin has increased from 9-10 with iron supplements) and is seeing Dr. Watt Climes for workup. He has a history of multiple colonic polyps that were left in place at his last colonoscopy. They have decided to embark on the least invasive method of workup first and it sounds like he is having a barium enema CT of the abdomen tomorrow. He is on warfarin anticoagulation for permanent atrial fibrillation and his INR today was 2.7. He has not had overt melena or hematochezia.  In addition to the atrial fibrillation he has a history of catheter related deep venous thrombosis of the left internal jugular and subclavian veins last summer.  He has underlying complete heart block and is pacemaker dependent. He has ischemic cardiomyopathy with a left ventricular ejection fraction around 35% after undergoing  bypass surgery in 1995. He had a biventricular device but developed pocket erosion roughly a year after generator change and had to be explanted in July 2014. He had a "temporary permanent" ventricular pacemaker placed percutaneously for a few weeks. This was complicated by deep venous thrombosis involving the left internal jugular and subclavian vein. A new CRT pacemaker was implanted but during the interval between the 2 devices he developed fairly significant and hard to manage congestive heart failure. Some improvement occurred after the new biventricular device was implanted, but he seemed to be receiving less successful resynchronization therapy, likely due to the more limited records available with CRT PE versus CRT-D. He required rehospitalization in February 2015 for acute heart failure with hyponatremia and worsening renal function. LV pacing was changed from LV3 to can with simultaneous RV and LV pacing, Reprogrammed LV3 to RV ring with LV first by 14msec. Since then his heart failure has been easier to manage, even though he still requires higher doses of diuretics than in the past. Recently he was seen in the office for lower extremity edema and weight gain and shortness of breath. We started treatment with metolazone to which he responded promptly.    No Known Allergies  Current Outpatient Prescriptions  Medication Sig Dispense Refill  . albuterol (PROVENTIL HFA;VENTOLIN HFA) 108 (90 BASE) MCG/ACT inhaler Inhale 2 puffs into the lungs every 6 (six) hours as needed for wheezing or shortness of breath.      . benzonatate (TESSALON) 200 MG capsule Take 1 capsule (200 mg total)  by mouth at bedtime. Take as needed for cough  12 capsule  1  . beta carotene w/minerals (OCUVITE) tablet Take 1 tablet by mouth daily.      . bimatoprost (LUMIGAN) 0.03 % ophthalmic solution Place 1 drop into both eyes at bedtime.       . digoxin (LANOXIN) 0.125 MG tablet Take 125 mcg by mouth at bedtime.      .  dorzolamide-timolol (COSOPT) 22.3-6.8 MG/ML ophthalmic solution Place 1 drop into both eyes 2 (two) times daily.       . furosemide (LASIX) 80 MG tablet Take one tablet (80mg ) in the AM and 1/2 tablet (40mg ) in the afternoon.  135 tablet  3  . Iron-Vitamin C (VITRON-C PO) Take by mouth daily.      Marland Kitchen levothyroxine (SYNTHROID, LEVOTHROID) 125 MCG tablet Take 125 mcg by mouth daily before breakfast.      . magnesium oxide (MAG-OX) 400 MG tablet Take 400 mg by mouth daily.      . metolazone (ZAROXOLYN) 5 MG tablet Take 5 mg by mouth daily as needed.      . pantoprazole (PROTONIX) 40 MG tablet Take 1 tablet (40 mg total) by mouth daily.  30 tablet  11  . potassium chloride (K-DUR,KLOR-CON) 10 MEQ tablet Take 3 tablets (30 mEq total) by mouth once.  90 tablet  11  . SYMBICORT 160-4.5 MCG/ACT inhaler INHALE 2 PUFFS 2 TIMES DAILY  10.2 g  3  . warfarin (COUMADIN) 5 MG tablet TAKE 1 TABLET DAILY AS DIRECTED.  90 tablet  1  . cephALEXin (KEFLEX) 500 MG capsule Take 1 capsule (500 mg total) by mouth 4 (four) times daily.  30 capsule  0  . colchicine 0.6 MG tablet Take 1 tablet (0.6 mg total) by mouth daily.  5 tablet  11   No current facility-administered medications for this visit.    Past Medical History  Diagnosis Date  . Ischemic cardiomyopathy     EF-35-45%by echo in 2011; first dx in the 90's but again in 2009 & rec'd 1st BiV ICD   . Paroxysmal atrial fibrillation     sinus rhythm on amiodarone  . Asbestos exposure     plaques on CT  . Compression fracture     T7  . Hypertension   . Coronary artery disease     last cath 2009, hx CABG  . Automatic implantable cardioverter-defibrillator in situ     now explanted  . Hypothyroidism   . CHF (congestive heart failure)     hx.  . Myocardial infarction     INFERIOR [phone] wth PTCA; Ant MI 1995 wth PTCA   . Shortness of breath     "only when external pacemaker wasn't working right" (06/17/2013)  . Arthritis     "a touch all over" (06/17/2013)    . COPD (chronic obstructive pulmonary disease)     "a touch" (8/4//2014)  . Atrial fibrillation, permanent 07/22/2013  . Biventricular cardiac pacemaker in situ 06/2013    St. Jude Allura (pt no longer wished for ICD portion)  . Pacemaker     Past Surgical History  Procedure Laterality Date  . Inguinal hernia repair Right   . Coronary artery bypass graft  11/01/1994    SVG to first diagonal, to distal RCA, to the ramus intermedius artery, and LIMA the LAD  . Insert / replace / remove pacemaker  10/05/1998    Original pacemaker 1999;  gen change 2006; upgrade to BiV ICD 2009,  gen change 10/2012;  explantation for skin erosion with chronic infection and temp PM placed until new device placed    . Cataract extraction w/ intraocular lens  implant, bilateral Bilateral   . Icd lead removal Left 05/27/2013    Procedure: ICD LEAD REMOVAL;  Surgeon: Evans Lance, MD;  Location: [facility];  Service: Cardiovascular;  Laterality: Left;  . Bi-ventricular pacemaker insertion (crt-p) Right 06/17/2013    St Jude  . Pacemaker removal Left 05/2013  . Coronary angioplasty with stent placement  12/17/2001    to VG-diag wth Zeta stent  . Cardiac catheterization  2009    Patent LIMA-LAD; patent VG-diag wth mild in-stent restenosis; patent VG-ramus intemedius; patent VG-RCA wth 70% mid post lat stenosis  . Cardiac catheterization  03/16/2006    patent LIMA, patent VGs;  EF 30%  . Cardioversion  10/12/2010    successful cardioversion  . Coronary angioplasty  [phone]    LAD;Inf MI PTCA-RCA; ant MI PTCA- LAD    Family History  Problem Relation Age of Onset  . Heart disease Father     History   Social History  . Marital Status: Widowed    Spouse Name: N/A    Number of Children: N/A  . 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 Smoking status: Never Smoker   . Smokeless tobacco: Never Used  . Alcohol Use: 8.4 oz/week     14 Glasses of wine per week     Comment: 01/08/14- 4 glasses of wine qwk  . Drug Use: No  . Sexual Activity: No   Other Topics Concern  . Not on file   Social History Narrative  . No narrative on file    Review of systems: Distant exertion, usually class 2-3 The patient specifically denies any chest pain at rest or with exertion, dyspnea at rest, orthopnea, paroxysmal nocturnal dyspnea, syncope, palpitations, focal neurological deficits, intermittent claudication, lower extremity edema, unexplained weight gain, cough, hemoptysis or wheezing.  The patient also denies abdominal pain, nausea, vomiting, dysphagia, diarrhea, constipation, polyuria, polydipsia, dysuria, hematuria, frequency, urgency, abnormal bleeding or bruising, fever, chills, unexpected weight changes, mood swings, change in skin or hair texture, change in voice quality, auditory or visual problems, allergic reactions or rashes. Right foot redness and swelling as described above  PHYSICAL EXAM BP 120/62  Pulse 80  Ht 5\' 11"  (1.803 m)  Wt 73.483 kg (162 lb)  BMI 22.60 kg/m2  General: Alert, oriented x3, no acutedistress  Head: no evidence of trauma, PERRL, EOMI, no exophtalmos or lid lag, no myxedema, no xanthelasma; normal ears, nose and oropharynx  Neck: jugular venous pulsations are elevated 5-6 cm and there is minimal hepatojugular reflux; v waves are very prominent; brisk carotid pulses without delay and no carotid bruits  Chest: clear to auscultation, no signs of consolidation by percussion or palpation, normal fremitus, symmetrical and full respiratory excursions. Both the old device sites and the new CRT pacemaker site appeared healthy without evidence of infection  Cardiovascular: normal position and quality of the apical impulse, regular rhythm, normal first and paradoxically split second heart sounds, no rubs. S3 gallop present, 2 / 6 holosystolic murmur at the left lower sternal border  Abdomen: no tenderness  or distention, no masses by palpation, no abnormal pulsatility or arterial bruits, normal bowel sounds, no hepatosplenomegaly  Extremities: no clubbing, cyanosis or edema; 2+ radial, ulnar and brachial pulses bilaterally;  2+ right femoral, posterior tibial and dorsalis pedis pulses; 2+ left femoral, posterior tibial and dorsalis pedis pulses; no subclavian or femoral bruits  Neurological: grossly nonfocal   Lipid Panel     Component Value Date/Time   CHOL 152 12/13/2012 0950   TRIG 159* 12/13/2012 0950   HDL 28* 12/13/2012 0950   CHOLHDL 5.4 12/13/2012 0950   VLDL 32 12/13/2012 0950   LDLCALC 92 12/13/2012 0950    BMET    Component Value Date/Time   NA 134* 01/22/2014 1121   K 3.1* 01/22/2014 1121   CL 91* 01/22/2014 1121   CO2 34* 01/22/2014 1121   GLUCOSE 151* 01/22/2014 1121   BUN 43* 01/22/2014 1121   CREATININE 1.24 01/22/2014 1121   CREATININE 1.20 12/31/2013 0415   CREATININE 1.19 06/19/2012 1119   CALCIUM 8.8 01/22/2014 1121   GFRNONAA 53* 12/31/2013 0415   GFRAA 61* 12/31/2013 0415     ASSESSMENT AND PLAN Acute on chronic combined systolic and diastolic heart failure The seems to have resolved. His status is back to NYHA class 2-3. His "dry weight" seems to be around 155 pounds. He has only required one more dose of metolazone in the last 2 weeks. I think he should decrease his potassium dose back to 30 mEq once daily. Will recheck laboratory tests. Unfortunately, it appears he is intolerant of ACE inhibitors due to hypotension and worsening renal function. We might attempt to rechallenge with these agents in the future  Cardiomyopathy, ischemic- EF 25-30% by 2D 12/27/13    Biventricular cardiac pacemaker in situ Improved response to cardiac resynchronization therapy after recent adjustment in LV pacing vector. Interrogation of the device today shows normal function. There is underlying complete heart block and he is pacemaker dependent. Biventricular pacing efficiency is 98%. Thoracic  impedance monitor shows resolution of hypervolemia.  Atrial fibrillation, permanent Risk of stroke and other embolic complications is high due to the presence of severe cardiomyopathy. On the other hand he appears to be having GI bleeding. May need to interrupt the warfarin at least temporarily. For the time being, until we have clear plans for invasive procedures, will reduce the warfarin dose to keep his INR around 2.0.  Gout attack - presumed diagnosis The appearance of the great toe is highly consistent with a gout attack and he has recently received enhanced diuretic therapy including a thiazide which could well have triggered this. I gave him a short course of colchicine. On the other hand the redness occurred almost immediately following a pedicure and there is a possibility that this could be infection. Since Kinser has been so ill, I don't think we should take any chances I prescribed a short course of cephalosporins as well.   Orders Placed This Encounter  Procedures  . Comprehensive metabolic panel  . CBC  . Uric acid   Meds ordered this encounter  Medications  . DISCONTD: potassium chloride (K-DUR,KLOR-CON) 10 MEQ tablet    Sig: Take 30 mEq by mouth 2 (two) times daily.  . beta carotene w/minerals (OCUVITE) tablet    Sig: Take 1 tablet by mouth daily.  . Iron-Vitamin C (VITRON-C PO)    Sig: Take by mouth daily.  . potassium chloride (K-DUR,KLOR-CON) 10 MEQ tablet    Sig: Take 3 tablets (30 mEq total) by mouth once.    Dispense:  90 tablet    Refill:  11  . cephALEXin (KEFLEX) 500 MG capsule    Sig: Take 1 capsule (500 mg total) by mouth  4 (four) times daily.    Dispense:  30 capsule    Refill:  0  . colchicine 0.6 MG tablet    Sig: Take 1 tablet (0.6 mg total) by mouth daily.    Dispense:  5 tablet    Refill:  [address], [place], [facility] [phone] office [phone] pager

## 2014-01-30 NOTE — Assessment & Plan Note (Signed)
The seems to have resolved. His status is back to NYHA class 2-3. His "dry weight" seems to be around 155 pounds. He has only required one more dose of metolazone in the last 2 weeks. I think he should decrease his potassium dose back to 30 mEq once daily. Will recheck laboratory tests. Unfortunately, it appears he is intolerant of ACE inhibitors due to hypotension and worsening renal function. We might attempt to rechallenge with these agents in the future

## 2014-01-30 NOTE — Patient Instructions (Signed)
Your physician recommends that you return for lab work in: today or tomorrow.  Decrease Potassium to 36meq daily.  Take Metolazone if your weight is greater than 159 lbs. Double the potassium on the days  you take metolazone.  Start keflex 500mg  3 times a day x 10 days.  Start Colchicine 0.6mg  a day x 5 days.  If the inflammation on your foot is gout this should clear it up.  Your physician recommends that you schedule a follow-up appointment in: ONE MONTH with Dr. Sallyanne Kuster.

## 2014-01-30 NOTE — Assessment & Plan Note (Signed)
Improved response to cardiac resynchronization therapy after recent adjustment in LV pacing vector. Interrogation of the device today shows normal function. There is underlying complete heart block and he is pacemaker dependent. Biventricular pacing efficiency is 98%. Thoracic impedance monitor shows resolution of hypervolemia.

## 2014-01-31 ENCOUNTER — Ambulatory Visit
Admission: RE | Admit: 2014-01-31 | Discharge: 2014-01-31 | Disposition: A | Discharge status: Home / Self Care | Payer: Medicare Other | Source: Ambulatory Visit | Attending: Gastroenterology | Admitting: Gastroenterology

## 2014-01-31 ENCOUNTER — Ambulatory Visit: Payer: Medicare Other

## 2014-01-31 DIAGNOSIS — D649 Anemia, unspecified: Secondary | ICD-10-CM

## 2014-01-31 LAB — COMPREHENSIVE METABOLIC PANEL
ALK PHOS: 93 U/L (ref 39–117)
ALT: 19 U/L (ref 0–53)
AST: 27 U/L (ref 0–37)
Albumin: 3.7 g/dL (ref 3.5–5.2)
BUN: 25 mg/dL — ABNORMAL HIGH (ref 6–23)
CO2: 29 mEq/L (ref 19–32)
Calcium: 8.3 mg/dL — ABNORMAL LOW (ref 8.4–10.5)
Chloride: 97 mEq/L (ref 96–112)
Creat: 1.19 mg/dL (ref 0.50–1.35)
Glucose, Bld: 129 mg/dL — ABNORMAL HIGH (ref 70–99)
POTASSIUM: 3.6 meq/L (ref 3.5–5.3)
SODIUM: 137 meq/L (ref 135–145)
TOTAL PROTEIN: 6.6 g/dL (ref 6.0–8.3)
Total Bilirubin: 0.7 mg/dL (ref 0.2–1.2)

## 2014-01-31 LAB — URIC ACID: URIC ACID, SERUM: 9.3 mg/dL — AB (ref 4.0–7.8)

## 2014-02-03 ENCOUNTER — Telehealth: Payer: Self-pay | Admitting: Cardiovascular Disease

## 2014-02-03 ENCOUNTER — Ambulatory Visit: Payer: Medicare Other

## 2014-02-03 NOTE — Telephone Encounter (Signed)
joks

## 2014-02-03 NOTE — Telephone Encounter (Signed)
He can stop it temporarily, but tomorrow is obviously too short of a notice. INR was 2.7 on 3/19. Erasmo Downer knows about it and can help coordinate, please

## 2014-02-03 NOTE — Telephone Encounter (Signed)
Please call-having endo tomorrow.Wants to know about his coumadin.

## 2014-02-03 NOTE — Telephone Encounter (Signed)
Dr. Watt Climes wants to do an endoscopy w/ propofol and only does this on certain days.  It can be done tomorrow or the end of April.  He wants to know if pt can stop coumadin for procedure.  If so, how long would pt be able to hold it or does pt need to stay on it?  Informed Dr. Sallyanne Kuster will be notified for further instructions.  Verbalized understanding.  Message forwarded to Dr. Sallyanne Kuster.

## 2014-02-04 NOTE — Telephone Encounter (Signed)
Deferred to [facility] per Dr. Loletha Grayer.

## 2014-02-04 NOTE — Telephone Encounter (Signed)
Will defer to [facility].  Dr. Watt Climes copied in message.

## 2014-02-05 ENCOUNTER — Ambulatory Visit: Payer: Medicare Other

## 2014-02-06 ENCOUNTER — Telehealth: Payer: Self-pay | Admitting: Cardiovascular Disease

## 2014-02-06 MED ORDER — METOLAZONE 2.5 MG PO TABS: 2.5000 mg | ORAL_TABLET | Freq: Every day | ORAL | Status: DC | PRN

## 2014-02-06 NOTE — Telephone Encounter (Signed)
Returned call to pt's daughter, [name].  Stated pt's furosemide dose was increased to 80 mg in AM and 40 mg in PM.  Stated he is in need of a refill and pharmacy won't let him have it.  Informed script was sent on 2.11.15 and should be on file at pharmacy.  Informed RN will call pharmacy to make sure and have them fill it.  Verbalized understanding.  Ann also asked if they can get metolazone in a smaller dose pill b/c they are having a hard time cutting the 5 mg tab in half.  Stated pt only takes 2.5 mg as needed.  Informed RN will clarify w/ Dr. Loletha Grayer and send in script if approved.  Verbalized understanding and agreed w/ plan.  Call to pharmacy and confirmed script on hold for furosemide

## 2014-02-06 NOTE — Telephone Encounter (Signed)
Having a problem with his meds refills due to dosage increased.  Please call

## 2014-02-06 NOTE — Telephone Encounter (Signed)
Spoke w/ Dr. Loletha Grayer and he advised pt can take metolazone 2.5 mg daily prn.    Call to [facility] and informed.  Verbalized understanding.  Rx sent to pharmacy.

## 2014-02-13 ENCOUNTER — Telehealth: Payer: Self-pay | Admitting: *Deleted

## 2014-02-13 MED ORDER — COLCHICINE 0.6 MG PO TABS: 0.6000 mg | ORAL_TABLET | Freq: Every day | ORAL | Status: DC

## 2014-02-13 NOTE — Telephone Encounter (Signed)
Please refill colchicine 0.6 mg, #30, ut probably only needs to take for 5-6 days for each episode

## 2014-02-13 NOTE — Telephone Encounter (Signed)
Returned call.  Left message to call back before 4pm.  Colchicine was refilled on 3.19.15.  Will inform Ann when she calls back.

## 2014-02-13 NOTE — Telephone Encounter (Signed)
[name]'s daughter called and stated that he now has gout in his right foot and would like to get the medication that he prescribed the last time for this. Colchicine 0.6 mg  MC

## 2014-02-13 NOTE — Telephone Encounter (Signed)
Message forwarded to Dr. [name].  

## 2014-02-13 NOTE — Telephone Encounter (Signed)
Ann called back and informed per MD.  Rx sent to pharmacy. Verbalized understanding.

## 2014-02-19 ENCOUNTER — Encounter: Payer: Self-pay | Admitting: Cardiovascular Disease

## 2014-03-06 NOTE — Telephone Encounter (Signed)
Encounter Closed--TP 03/06/2014 

## 2014-03-07 ENCOUNTER — Telehealth: Payer: Self-pay | Admitting: Cardiovascular Disease

## 2014-03-07 NOTE — Telephone Encounter (Signed)
She said she have sent 3 fax over,wanting to know when he can stop his coumadin. He is having his endo on Monday.Please let her know something today.

## 2014-03-07 NOTE — Telephone Encounter (Signed)
Authorization faxed to [facility] Coumadin starting today until after the procedure to Dr. Watt Climes.

## 2014-03-07 NOTE — Telephone Encounter (Signed)
Forwarded to [place], [place].

## 2014-03-07 NOTE — Telephone Encounter (Signed)
Message left for Dr. Perley Jain office.  Dr.  Loletha Grayer will be in @ 3pm today.  Will have him sign off and fax ASAP.

## 2014-03-10 ENCOUNTER — Other Ambulatory Visit: Payer: Self-pay | Admitting: Gastroenterology

## 2014-03-10 NOTE — Telephone Encounter (Signed)
Signed authorization faxed to [employer] coumadin starting today and restart ASAP after endoscopy.

## 2014-03-11 ENCOUNTER — Ambulatory Visit (INDEPENDENT_AMBULATORY_CARE_PROVIDER_SITE_OTHER): Payer: Medicare Other | Admitting: Cardiovascular Disease

## 2014-03-11 ENCOUNTER — Encounter: Payer: Self-pay | Admitting: Cardiovascular Disease

## 2014-03-11 VITALS — BP 126/69 | HR 71 | Resp 16 | Ht 71.0 in | Wt 167.4 lb

## 2014-03-11 DIAGNOSIS — I5042 Chronic combined systolic (congestive) and diastolic (congestive) heart failure: Secondary | ICD-10-CM

## 2014-03-11 DIAGNOSIS — I255 Ischemic cardiomyopathy: Secondary | ICD-10-CM

## 2014-03-11 DIAGNOSIS — M109 Gout, unspecified: Secondary | ICD-10-CM

## 2014-03-11 DIAGNOSIS — I5043 Acute on chronic combined systolic (congestive) and diastolic (congestive) heart failure: Secondary | ICD-10-CM

## 2014-03-11 DIAGNOSIS — Z950 Presence of cardiac pacemaker: Secondary | ICD-10-CM

## 2014-03-11 DIAGNOSIS — I4891 Unspecified atrial fibrillation: Secondary | ICD-10-CM

## 2014-03-11 DIAGNOSIS — I2510 Atherosclerotic heart disease of native coronary artery without angina pectoris: Secondary | ICD-10-CM

## 2014-03-11 DIAGNOSIS — I2589 Other forms of chronic ischemic heart disease: Secondary | ICD-10-CM

## 2014-03-11 DIAGNOSIS — I5023 Acute on chronic systolic (congestive) heart failure: Secondary | ICD-10-CM

## 2014-03-11 DIAGNOSIS — I4821 Permanent atrial fibrillation: Secondary | ICD-10-CM

## 2014-03-11 DIAGNOSIS — I509 Heart failure, unspecified: Secondary | ICD-10-CM

## 2014-03-11 DIAGNOSIS — Z79899 Other long term (current) drug therapy: Secondary | ICD-10-CM

## 2014-03-11 LAB — MDC_IDC_ENUM_SESS_TYPE_INCLINIC
Battery Remaining Longevity: 49.2 mo
Battery Voltage: 2.96 V
Brady Statistic RA Percent Paced: 0 %
Brady Statistic RV Percent Paced: 99 %
Date Time Interrogation Session: 20150428133435
Implantable Pulse Generator Model: 3242
Implantable Pulse Generator Serial Number: 7515957
Lead Channel Impedance Value: 512.5 Ohm
Lead Channel Impedance Value: 600 Ohm
Lead Channel Pacing Threshold Amplitude: 1 V
Lead Channel Pacing Threshold Amplitude: 1 V
Lead Channel Pacing Threshold Amplitude: 1.75 V
Lead Channel Pacing Threshold Amplitude: 1.75 V
Lead Channel Pacing Threshold Pulse Width: 0.4 ms
Lead Channel Pacing Threshold Pulse Width: 0.4 ms
Lead Channel Pacing Threshold Pulse Width: 1 ms
Lead Channel Pacing Threshold Pulse Width: 1 ms
Lead Channel Sensing Intrinsic Amplitude: 8.1 mV
Lead Channel Setting Pacing Amplitude: 2.5 V
Lead Channel Setting Pacing Amplitude: 2.75 V
Lead Channel Setting Pacing Pulse Width: 0.4 ms
Lead Channel Setting Pacing Pulse Width: 1 ms
Lead Channel Setting Sensing Sensitivity: 6 mV

## 2014-03-11 LAB — PACEMAKER DEVICE OBSERVATION

## 2014-03-11 MED ORDER — CAPTOPRIL 6.25 MG HALF TABLET
6.2500 mg | ORAL_TABLET | Freq: Two times a day (BID) | ORAL | Status: DC
Start: 2014-03-11 — End: 2014-03-11

## 2014-03-11 MED ORDER — ALLOPURINOL 300 MG PO TABS: 300.0000 mg | ORAL_TABLET | Freq: Every day | ORAL | Status: DC

## 2014-03-11 MED ORDER — CAPTOPRIL 12.5 MG PO TABS: ORAL_TABLET | ORAL | Status: DC

## 2014-03-11 MED ORDER — CAPTOPRIL 6.25 MG HALF TABLET: 6.2500 mg | ORAL_TABLET | Freq: Two times a day (BID) | ORAL | Status: DC

## 2014-03-11 NOTE — Patient Instructions (Signed)
Start Allopurinol 300mg  daily.  Start Captopril 6.25mg  twice  Day.  Your physician recommends that you return for lab work in: Prior to next appointment.  Dr. Sallyanne Kuster recommends that you schedule a follow-up appointment in: 6 weeks (  HAVE LAB WORK DONE 2-3 DAYS BEFORE YOUR APPOINTMENT.)

## 2014-03-13 ENCOUNTER — Other Ambulatory Visit: Payer: Self-pay

## 2014-03-13 MED ORDER — DIGOXIN 125 MCG PO TABS: 125.0000 ug | ORAL_TABLET | Freq: Every day | ORAL | Status: DC

## 2014-03-13 NOTE — Telephone Encounter (Signed)
Rx was sent to pharmacy electronically. 

## 2014-03-17 ENCOUNTER — Encounter: Payer: Self-pay | Admitting: Cardiovascular Disease

## 2014-03-17 NOTE — Assessment & Plan Note (Signed)
Appropriate warfarin anticoagulation with therapeutic levels. He has not had any more signs of gastrointestinal bleeding. Most recent hemoglobin had increased to 10.1. His abdominal CT did not show any evidence of malignancy, but did confirm extensive diverticulosis, especially in the ascending colon.

## 2014-03-17 NOTE — Assessment & Plan Note (Signed)
No symptoms to suggest coronary insufficiency at this time

## 2014-03-17 NOTE — Assessment & Plan Note (Signed)
Normal device function. No permanent reprogramming changes performed. LV lead pacing configuration is mid3 to RV ring. This configuration has a relatively high pacing threshold (1.75 V at 1.0 ms pulse width) but appears to provide optimal resynchronization.

## 2014-03-17 NOTE — Progress Notes (Signed)
Patient ID: [name], male   DOB: 1926/10/30, 80 y.o.   MRN: [id number]      Reason for office visit Heart failure, CAD, atrial fibrillation, biventricular pacemaker  [name] continues to improve. He had a lot of difficulty last year following device pocket erosion with exposure of his biventricular defibrillator that required removal. He had a lot of problems with heart failure after losing CRT until a new device was implanted. Even after his new CRT-P device was placed he continued to have congestive heart failure probably due to less effective resynchronization. After his most recent device reprogramming with a new vector of left ventricular pacing he has shown substantial and continued improvement. He had been developing cardiac cachexia but is now putting weight back on. This makes it a little harder to judge his diuretic therapy doses. His family has been meticulously monitoring his weight, sodium intake and administering metolazone as needed for weight gain. He has had 3 episodes of gout within the last 2 months. He has almost normal renal function (serum cracking 1.19, estimated GFR greater than 50) and is hyperuricemic (uric acid 9.3).  He has underlying complete heart block and is pacemaker dependent. He has ischemic cardiomyopathy with a left ventricular ejection fraction around 35% after undergoing bypass surgery in 1995. His cardiac catheterization in 2009 showed all his grafts to be widely patent. He had a biventricular device but developed pocket erosion roughly a year after generator change and had to be explanted in July 2014. He had a "temporary permanent" ventricular pacemaker placed percutaneously for a few weeks. This was complicated by deep venous thrombosis involving the left internal jugular and subclavian vein. A new CRT pacemaker was implanted but during the interval between the 2 devices he developed fairly significant and hard to manage congestive heart failure. Some  improvement occurred after the new biventricular device was implanted, but he seemed to be receiving less successful resynchronization therapy, likely due to the more limited pacing vectors available with CRT P versus CRT-D. He required rehospitalization in February 2015 for acute heart failure with hyponatremia and worsening renal function. LV pacing was changed from LV3 to can with simultaneous RV and LV pacing, Reprogrammed LV3 to RV ring with LV first by 50msec. Since then his heart failure has been easier to manage, even though he still requires higher doses of diuretics than in the past. Recently he was seen in the office for lower extremity edema and weight gain and shortness of breath. We started treatment with metolazone to which he responded promptly.  He has permanent atrial fibrillation on chronic warfarin anticoagulation.  He has pulmonary fibrosis, presumed to be asbestosis. As well treated hypertension and hyperlipidemia and compensated hypothyroidism.     No Known Allergies  Current Outpatient Prescriptions  Medication Sig Dispense Refill  . albuterol (PROVENTIL HFA;VENTOLIN HFA) 108 (90 BASE) MCG/ACT inhaler Inhale 2 puffs into the lungs every 6 (six) hours as needed for wheezing or shortness of breath.      . beta carotene w/minerals (OCUVITE) tablet Take 1 tablet by mouth daily.      . bimatoprost (LUMIGAN) 0.03 % ophthalmic solution Place 1 drop into both eyes at bedtime.       . colchicine 0.6 MG tablet Take 1 tablet (0.6 mg total) by mouth daily. Only take 5-6 days for each episode.  30 tablet  5  . dorzolamide-timolol (COSOPT) 22.3-6.8 MG/ML ophthalmic solution Place 1 drop into both eyes 2 (two) times daily.       Marland Kitchen  furosemide (LASIX) 80 MG tablet Take one tablet (80mg ) in the AM and 1/2 tablet (40mg ) in the afternoon.  135 tablet  3  . Iron-Vitamin C (VITRON-C PO) Take by mouth daily.      Marland Kitchen levothyroxine (SYNTHROID, LEVOTHROID) 125 MCG tablet Take 125 mcg by mouth daily  before breakfast.      . magnesium oxide (MAG-OX) 400 MG tablet Take 400 mg by mouth daily.      . metolazone (ZAROXOLYN) 2.5 MG tablet Take 1 tablet (2.5 mg total) by mouth daily as needed.  30 tablet  3  . pantoprazole (PROTONIX) 40 MG tablet Take 1 tablet (40 mg total) by mouth daily.  30 tablet  11  . potassium chloride (K-DUR,KLOR-CON) 10 MEQ tablet Take 3 tablets (30 mEq total) by mouth once.  90 tablet  11  . SYMBICORT 160-4.5 MCG/ACT inhaler INHALE 2 PUFFS 2 TIMES DAILY  10.2 g  3  . warfarin (COUMADIN) 5 MG tablet TAKE 1 TABLET DAILY AS DIRECTED.  90 tablet  1  . allopurinol (ZYLOPRIM) 300 MG tablet Take 1 tablet (300 mg total) by mouth daily.  30 tablet  6  . captopril (CAPOTEN) 12.5 MG tablet Take 1/2 tablet twice a day.  30 tablet  6  . digoxin (LANOXIN) 0.125 MG tablet Take 1 tablet (125 mcg total) by mouth at bedtime.  90 tablet  3   No current facility-administered medications for this visit.    Past Medical History  Diagnosis Date  . Ischemic cardiomyopathy     EF-35-45%by echo in 2011; first dx in the 90's but again in 2009 & rec'd 1st BiV ICD   . Paroxysmal atrial fibrillation     sinus rhythm on amiodarone  . Asbestos exposure     plaques on CT  . Compression fracture     T7  . Hypertension   . Coronary artery disease     last cath 2009, hx CABG  . Automatic implantable cardioverter-defibrillator in situ     now explanted  . Hypothyroidism   . CHF (congestive heart failure)     hx.  . Myocardial infarction     INFERIOR [id number] wth PTCA; Ant MI 1995 wth PTCA   . Shortness of breath     "only when external pacemaker wasn't working right" (06/17/2013)  . Arthritis     "a touch all over" (06/17/2013)  . COPD (chronic obstructive pulmonary disease)     "a touch" (8/4//2014)  . Atrial fibrillation, permanent 07/22/2013  . Biventricular cardiac pacemaker in situ 06/2013    St. Jude Allura (pt no longer wished for ICD portion)  . Pacemaker     Past Surgical History    Procedure Laterality Date  . Inguinal hernia repair Right   . Coronary artery bypass graft  11/01/1994    SVG to first diagonal, to distal RCA, to the ramus intermedius artery, and LIMA the LAD  . 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 Cataract extraction w/ intraocular lens  implant, bilateral Bilateral   . Icd lead removal Left 05/27/2013    Procedure: ICD LEAD REMOVAL;  Surgeon: Evans Lance, MD;  Location: [place];  Service: Cardiovascular;  Laterality: Left;  . Bi-ventricular pacemaker insertion (crt-p) Right 06/17/2013  St Jude  . Pacemaker removal Left 05/2013  . Coronary angioplasty with stent placement  12/17/2001    to VG-diag wth Zeta stent  . Cardiac catheterization  2009    Patent LIMA-LAD; patent VG-diag wth mild in-stent restenosis; patent VG-ramus intemedius; patent VG-RCA wth 70% mid post lat stenosis  . Cardiac catheterization  03/16/2006    patent LIMA, patent VGs;  EF 30%  . Cardioversion  10/12/2010    successful cardioversion  . Coronary angioplasty  [phone]    LAD;Inf MI PTCA-RCA; ant MI PTCA- LAD    Family History  Problem Relation Age of Onset  . Heart disease Father     History   Social History  . Marital Status: Widowed    Spouse Name: N/A    Number of Children: N/A  . 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 Smoking status: Never Smoker   . Smokeless tobacco: Never Used  . Alcohol Use: 8.4 oz/week    14 Glasses of wine per week     Comment: 01/08/14- 4 glasses of wine qwk  . Drug Use: No  . Sexual Activity: No   Other Topics Concern  . Not on file   Social History Narrative  . No narrative on file    Review of systems: Dyspnea with exertion, usually  class 2  The patient specifically denies any chest pain at rest or with exertion, dyspnea at rest, orthopnea, paroxysmal nocturnal dyspnea, syncope, palpitations, focal neurological deficits, intermittent claudication, lower extremity edema, unexplained weight gain, cough, hemoptysis or wheezing.  The patient also denies abdominal pain, nausea, vomiting, dysphagia, diarrhea, constipation, polyuria, polydipsia, dysuria, hematuria, frequency, urgency, abnormal bleeding or bruising, fever, chills, unexpected weight changes, mood swings, change in skin or hair texture, change in voice quality, auditory or visual problems, allergic reactions or rashes. Right foot redness and swelling as described above   PHYSICAL EXAM BP 126/69  Pulse 71  Resp 16  Ht 5\' 11"  (1.803 m)  Wt 167 lb 6.4 oz (75.932 kg)  BMI 23.36 kg/m2 General: Alert, oriented x3, no acutedistress  Head: no evidence of trauma, PERRL, EOMI, no exophtalmos or lid lag, no myxedema, no xanthelasma; normal ears, nose and oropharynx  Neck: jugular venous pulsations are elevated 5-6 cm and there is minimal hepatojugular reflux; v waves are very prominent; brisk carotid pulses without delay and no carotid bruits  Chest: clear to auscultation, no signs of consolidation by percussion or palpation, normal fremitus, symmetrical and full respiratory excursions. Both the old device sites and the new CRT pacemaker site appeared healthy without evidence of infection  Cardiovascular: normal position and quality of the apical impulse, regular rhythm, normal first and paradoxically split second heart sounds, no rubs. S3 gallop present, 2 / 6 holosystolic murmur at the left lower sternal border  Abdomen: no tenderness or distention, no masses by palpation, no abnormal pulsatility or arterial bruits, normal bowel sounds, no hepatosplenomegaly  Extremities: no clubbing, cyanosis or edema; 2+ radial, ulnar and brachial pulses bilaterally; 2+ right femoral,  posterior tibial and dorsalis pedis pulses; 2+ left femoral, posterior tibial and dorsalis pedis pulses; no subclavian or femoral bruits  Neurological: grossly nonfocal   EKG: Permanent atrial fibrillation, biventricular pacing  Lipid Panel     Component Value Date/Time   CHOL 152 12/13/2012 0950   TRIG 159* 12/13/2012 0950   HDL 28* 12/13/2012 0950   CHOLHDL 5.4  12/13/2012 0950   VLDL 32 12/13/2012 0950   LDLCALC 92 12/13/2012 0950    BMET    Component Value Date/Time   NA 137 01/30/2014 1807   K 3.6 01/30/2014 1807   CL 97 01/30/2014 1807   CO2 29 01/30/2014 1807   GLUCOSE 129* 01/30/2014 1807   BUN 25* 01/30/2014 1807   CREATININE 1.19 01/30/2014 1807   CREATININE 1.20 12/31/2013 0415   CREATININE 1.19 06/19/2012 1119   CALCIUM 8.3* 01/30/2014 1807   GFRNONAA 45* 01/08/2014 1346   GFRNONAA 53* 12/31/2013 0415   GFRAA 52* 01/08/2014 1346   GFRAA 61* 12/31/2013 0415     ASSESSMENT AND PLAN Chronic combined systolic and diastolic congestive heart failure, NYHA class 2 Substantial overall improvement. I think some of his recent weight gain is not secondary to fluid but true weight gain his appetite has improved urine and we may be keeping him consistently hypovolemic by adjusting his diuretics to an outdated "dry weight". This is likely the reason for his frequent gout attacks. We'll reestablish his dry weight to 170 pounds. I'm not sure why but he was not receiving captopril. We'll resume this at 6.25 mg twice a day and recheck labs in a few weeks.  Gout attack Start allopurinol 300 mg and recheck labs in a few weeks. Reducing the diuretic dose, especially limiting the need for a thiazide diuretic will be helpful.  Biventricular cardiac pacemaker in situ Normal device function. No permanent reprogramming changes performed. LV lead pacing configuration is mid3 to RV ring. This configuration has a relatively high pacing threshold (1.75 V at 1.0 ms pulse width) but appears to provide optimal  resynchronization.  Atrial fibrillation, permanent Appropriate warfarin anticoagulation with therapeutic levels. He has not had any more signs of gastrointestinal bleeding. Most recent hemoglobin had increased to 10.1. His abdominal CT did not show any evidence of malignancy, but did confirm extensive diverticulosis, especially in the ascending colon.  CAD- CABG X 4 '95, patent grafts '09 No symptoms to suggest coronary insufficiency at this time   Patient Instructions  Start Allopurinol 300mg  daily.  Start Captopril 6.25mg  twice  Day.  Your physician recommends that you return for lab work in: Prior to next appointment.  Dr. Sallyanne Kuster recommends that you schedule a follow-up appointment in: 6 weeks (  HAVE LAB WORK DONE 2-3 DAYS BEFORE YOUR APPOINTMENT.)      Orders Placed This Encounter  Procedures  . Basic metabolic panel  . Uric acid  . Implantable device check   Meds ordered this encounter  Medications  . allopurinol (ZYLOPRIM) 300 MG tablet    Sig: Take 1 tablet (300 mg total) by mouth daily.    Dispense:  30 tablet    Refill:  6  . DISCONTD: captopril (CAPOTEN) 6.25 mg TABS tablet    Sig: Take 0.5 tablets (6.25 mg total) by mouth 2 (two) times daily.    Dispense:  60 tablet    Refill:  6  . DISCONTD: captopril (CAPOTEN) 6.25 mg TABS tablet    Sig: Take 0.5 tablets (6.25 mg total) by mouth 2 (two) times daily.    Dispense:  60 tablet    Refill:  6  . captopril (CAPOTEN) 12.5 MG tablet    Sig: Take 1/2 tablet twice a day.    Dispense:  30 tablet    Refill:  6    Chesnee Floren  [name], MD, [facility] [phone] office [phone] pager

## 2014-03-17 NOTE — Assessment & Plan Note (Signed)
Start allopurinol 300 mg and recheck labs in a few weeks. Reducing the diuretic dose, especially limiting the need for a thiazide diuretic will be helpful.

## 2014-03-17 NOTE — Assessment & Plan Note (Signed)
Substantial overall improvement. I think some of his recent weight gain is not secondary to fluid but true weight gain his appetite has improved urine and we may be keeping him consistently hypovolemic by adjusting his diuretics to an outdated "dry weight". This is likely the reason for his frequent gout attacks. We'll reestablish his dry weight to 170 pounds. I'm not sure why but he was not receiving captopril. We'll resume this at 6.25 mg twice a day and recheck labs in a few weeks.

## 2014-03-21 ENCOUNTER — Telehealth: Payer: Self-pay | Admitting: Cardiovascular Disease

## 2014-03-21 MED ORDER — POTASSIUM CHLORIDE CRYS ER 10 MEQ PO TBCR: 30.0000 meq | EXTENDED_RELEASE_TABLET | Freq: Every day | ORAL | Status: DC

## 2014-03-21 NOTE — Telephone Encounter (Signed)
Refills sent to pharmacy. 

## 2014-03-21 NOTE — Telephone Encounter (Signed)
Calling about Mr. Goldberg Klor-Con 32meq daily .Marland Kitchen Currently taking 3 a day .Marland Kitchen Pharmacist states that they will not refill until the May 22nd and he is almost out of his medication .... Is stating that the dosage has to be up if not then he runs out before he is supposed to .Marland Kitchen Please Call  Thanks

## 2014-03-26 ENCOUNTER — Other Ambulatory Visit: Payer: Self-pay | Admitting: Gastroenterology

## 2014-04-11 ENCOUNTER — Telehealth: Payer: Self-pay | Admitting: Cardiovascular Disease

## 2014-04-11 NOTE — Telephone Encounter (Signed)
Wants to know if her father ( Mr. Mercadel) needs to have lab work done before his appt on 04/22/2014... Please call  Thanks

## 2014-04-11 NOTE — Telephone Encounter (Signed)
Pt. Called and i talked to his daughter, no labs that i could see had been ordered, but if he became weaker to go to hospital or pcp

## 2014-04-22 ENCOUNTER — Encounter: Payer: Self-pay | Admitting: Cardiovascular Disease

## 2014-04-22 ENCOUNTER — Encounter: Payer: Medicare Other | Admitting: Cardiovascular Disease

## 2014-04-22 ENCOUNTER — Ambulatory Visit (INDEPENDENT_AMBULATORY_CARE_PROVIDER_SITE_OTHER): Payer: Medicare Other | Admitting: Cardiovascular Disease

## 2014-04-22 VITALS — BP 103/55 | HR 70 | Resp 16 | Ht 71.0 in | Wt 165.0 lb

## 2014-04-22 DIAGNOSIS — I4821 Permanent atrial fibrillation: Secondary | ICD-10-CM

## 2014-04-22 DIAGNOSIS — D126 Benign neoplasm of colon, unspecified: Secondary | ICD-10-CM

## 2014-04-22 DIAGNOSIS — Z950 Presence of cardiac pacemaker: Secondary | ICD-10-CM

## 2014-04-22 DIAGNOSIS — M109 Gout, unspecified: Secondary | ICD-10-CM

## 2014-04-22 DIAGNOSIS — I5043 Acute on chronic combined systolic (congestive) and diastolic (congestive) heart failure: Secondary | ICD-10-CM

## 2014-04-22 DIAGNOSIS — R5381 Other malaise: Secondary | ICD-10-CM

## 2014-04-22 DIAGNOSIS — I2589 Other forms of chronic ischemic heart disease: Secondary | ICD-10-CM

## 2014-04-22 DIAGNOSIS — Z79899 Other long term (current) drug therapy: Secondary | ICD-10-CM

## 2014-04-22 DIAGNOSIS — I2510 Atherosclerotic heart disease of native coronary artery without angina pectoris: Secondary | ICD-10-CM

## 2014-04-22 DIAGNOSIS — I5042 Chronic combined systolic (congestive) and diastolic (congestive) heart failure: Secondary | ICD-10-CM

## 2014-04-22 DIAGNOSIS — R5383 Other fatigue: Principal | ICD-10-CM

## 2014-04-22 DIAGNOSIS — I255 Ischemic cardiomyopathy: Secondary | ICD-10-CM

## 2014-04-22 DIAGNOSIS — I509 Heart failure, unspecified: Secondary | ICD-10-CM

## 2014-04-22 DIAGNOSIS — I4891 Unspecified atrial fibrillation: Secondary | ICD-10-CM

## 2014-04-22 DIAGNOSIS — I5023 Acute on chronic systolic (congestive) heart failure: Secondary | ICD-10-CM

## 2014-04-22 DIAGNOSIS — J45909 Unspecified asthma, uncomplicated: Secondary | ICD-10-CM

## 2014-04-22 LAB — CBC
HEMATOCRIT: 34.8 % — AB (ref 39.0–52.0)
HEMOGLOBIN: 11.7 g/dL — AB (ref 13.0–17.0)
MCH: 29.7 pg (ref 26.0–34.0)
MCHC: 33.6 g/dL (ref 30.0–36.0)
MCV: 88.3 fL (ref 78.0–100.0)
Platelets: 130 10*3/uL — ABNORMAL LOW (ref 150–400)
RBC: 3.94 MIL/uL — ABNORMAL LOW (ref 4.22–5.81)
RDW: 18.8 % — ABNORMAL HIGH (ref 11.5–15.5)
WBC: 4.4 10*3/uL (ref 4.0–10.5)

## 2014-04-22 LAB — MDC_IDC_ENUM_SESS_TYPE_INCLINIC
Brady Statistic RA Percent Paced: 0 %
Brady Statistic RV Percent Paced: 99 %
Date Time Interrogation Session: 20150609152721
Implantable Pulse Generator Serial Number: 7515957
Lead Channel Impedance Value: 512.5 Ohm
Lead Channel Impedance Value: 600 Ohm
Lead Channel Pacing Threshold Amplitude: 0.75 V
Lead Channel Pacing Threshold Amplitude: 2 V
Lead Channel Pacing Threshold Amplitude: 2 V
Lead Channel Pacing Threshold Pulse Width: 0.4 ms
Lead Channel Pacing Threshold Pulse Width: 1 ms
Lead Channel Setting Pacing Amplitude: 2.75 V
Lead Channel Setting Pacing Pulse Width: 0.4 ms
Lead Channel Setting Pacing Pulse Width: 1 ms
Lead Channel Setting Sensing Sensitivity: 6 mV
MDC IDC MSMT BATTERY REMAINING LONGEVITY: 55.2 mo
MDC IDC MSMT BATTERY VOLTAGE: 2.96 V
MDC IDC MSMT LEADCHNL LV PACING THRESHOLD PULSEWIDTH: 1 ms
MDC IDC MSMT LEADCHNL RV PACING THRESHOLD AMPLITUDE: 0.75 V
MDC IDC MSMT LEADCHNL RV PACING THRESHOLD PULSEWIDTH: 0.4 ms
MDC IDC MSMT LEADCHNL RV SENSING INTR AMPL: 9.7 mV
MDC IDC PG MODEL: 3242
MDC IDC SET LEADCHNL RV PACING AMPLITUDE: 2.5 V

## 2014-04-22 LAB — PACEMAKER DEVICE OBSERVATION

## 2014-04-22 MED ORDER — METOLAZONE 2.5 MG PO TABS: ORAL_TABLET | ORAL | Status: DC

## 2014-04-22 NOTE — Progress Notes (Signed)
Patient ID: [name], male   DOB: 1926-03-27, 80 y.o.   MRN: [id number]      Reason for office visit Congestive heart failure, CAD, atrial fibrillation, CRT-P.   After a period of improvement, [name] is not feeling as well. He wakes up in the morning gets dressed and then feels tired. He has been very sedentary. He denies frank dyspnea on exertion and complains more fatigued. On exam, however, he is a little tachypneic.   At his last appointment we restarted treatment with an ACE inhibitor and a very low dose. I also "backed off" his diuretic since I thought he was gaining weight from that appetite rather than fluid. Today his neck veins are distended he has a little more abdominal distention and thoracic impedance reading suggests that he may be a little hypervolemic.  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 It has been recommended that he consider a partial colectomy, but obviously he and his family are worried about possible cardiac complications. They have not yet seen a Psychologist, sport and exercise.  He has underlying complete heart block and is pacemaker dependent. He has ischemic cardiomyopathy with a left ventricular ejection fraction around 35% after undergoing bypass surgery in 1995. His cardiac catheterization in 2009 showed all his grafts to be widely patent.  He had a biventricular device but developed pocket erosion roughly a year after generator change and had to be explanted in July 2014. He had a "temporary permanent" ventricular pacemaker placed percutaneously for a few weeks. This was complicated by deep venous thrombosis involving the left internal jugular and subclavian vein. A new CRT pacemaker was implanted but during the interval between the 2 devices he developed fairly significant and hard to manage congestive heart failure. Some improvement occurred after the new biventricular device was  implanted, but he seemed to be receiving less successful resynchronization therapy, likely due to the more limited pacing vectors available with CRT P versus CRT-D. He required rehospitalization in February 2015 for acute heart failure with hyponatremia and worsening renal function. LV pacing was changed from LV3 to can with simultaneous RV and LV pacing, Reprogrammed LV3 to RV ring with LV first by 77msec. Since then his heart failure has been easier to manage, even though he still requires higher doses of diuretics than in the past. Recently he was seen in the office for lower extremity edema and weight gain and shortness of breath. We started treatment with metolazone to which he responded promptly.  He has permanent atrial fibrillation on chronic warfarin anticoagulation.  He has pulmonary fibrosis, presumed to be asbestosis.  As well treated hypertension and hyperlipidemia and compensated hypothyroidism.    No Known Allergies  Current Outpatient Prescriptions  Medication Sig Dispense Refill  . albuterol (PROVENTIL HFA;VENTOLIN HFA) 108 (90 BASE) MCG/ACT inhaler Inhale 2 puffs into the lungs every 6 (six) hours as needed for wheezing or shortness of breath.      . allopurinol (ZYLOPRIM) 300 MG tablet Take 1 tablet (300 mg total) by mouth daily.  30 tablet  6  . beta carotene w/minerals (OCUVITE) tablet Take 1 tablet by mouth daily.      . bimatoprost (LUMIGAN) 0.03 % ophthalmic solution Place 1 drop into both eyes at bedtime.       . captopril (CAPOTEN) 12.5 MG tablet Take 1/2 tablet twice a day.  30 tablet  6  . colchicine 0.6 MG tablet Take  1 tablet (0.6 mg total) by mouth daily. Only take 5-6 days for each episode.  30 tablet  5  . digoxin (LANOXIN) 0.125 MG tablet Take 1 tablet (125 mcg total) by mouth at bedtime.  90 tablet  3  . dorzolamide-timolol (COSOPT) 22.3-6.8 MG/ML ophthalmic solution Place 1 drop into both eyes 2 (two) times daily.       . furosemide (LASIX) 80 MG tablet Take one  tablet (80mg ) in the AM and 1/2 tablet (40mg ) in the afternoon.  135 tablet  3  . Iron-Vitamin C (VITRON-C PO) Take by mouth daily.      Marland Kitchen levothyroxine (SYNTHROID, LEVOTHROID) 125 MCG tablet Take 125 mcg by mouth daily before breakfast.      . magnesium oxide (MAG-OX) 400 MG tablet Take 400 mg by mouth daily.      . metolazone (ZAROXOLYN) 2.5 MG tablet Take 2 times a week or as needed to keep weight down to less than 160 lbs.  30 tablet  3  . pantoprazole (PROTONIX) 40 MG tablet Take 1 tablet (40 mg total) by mouth daily.  30 tablet  11  . potassium chloride (K-DUR,KLOR-CON) 10 MEQ tablet Take 3 tablets (30 mEq total) by mouth daily.  90 tablet  5  . SYMBICORT 160-4.5 MCG/ACT inhaler INHALE 2 PUFFS 2 TIMES DAILY  10.2 g  3  . warfarin (COUMADIN) 5 MG tablet TAKE 1 TABLET DAILY AS DIRECTED.  90 tablet  1   No current facility-administered medications for this visit.    Past Medical History  Diagnosis Date  . Ischemic cardiomyopathy     EF-35-45%by echo in 2011; first dx in the 90's but again in 2009 & rec'd 1st BiV ICD   . Paroxysmal atrial fibrillation     sinus rhythm on amiodarone  . Asbestos exposure     plaques on CT  . Compression fracture     T7  . Hypertension   . Coronary artery disease     last cath 2009, hx CABG  . Automatic implantable cardioverter-defibrillator in situ     now explanted  . Hypothyroidism   . CHF (congestive heart failure)     hx.  . Myocardial infarction     INFERIOR [phone] wth PTCA; Ant MI 1995 wth PTCA   . Shortness of breath     "only when external pacemaker wasn't working right" (06/17/2013)  . Arthritis     "a touch all over" (06/17/2013)  . COPD (chronic obstructive pulmonary disease)     "a touch" (8/4//2014)  . Atrial fibrillation, permanent 07/22/2013  . Biventricular cardiac pacemaker in situ 06/2013    St. Jude Allura (pt no longer wished for ICD portion)  . Pacemaker     Past Surgical History  Procedure Laterality Date  . Inguinal  hernia repair Right   . Coronary artery bypass graft  11/01/1994    SVG to first diagonal, to distal RCA, to the ramus intermedius artery, and LIMA the LAD  . 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 Cataract extraction w/ intraocular lens  implant, bilateral Bilateral   . Icd lead removal Left 05/27/2013    Procedure: ICD LEAD REMOVAL;  Surgeon: Evans Lance, MD;  Location: [facility];  Service: Cardiovascular;  Laterality: Left;  .  Bi-ventricular pacemaker insertion (crt-p) Right 06/17/2013    St Jude  . Pacemaker removal Left 05/2013  . Coronary angioplasty with stent placement  12/17/2001    to VG-diag wth Zeta stent  . Cardiac catheterization  2009    Patent LIMA-LAD; patent VG-diag wth mild in-stent restenosis; patent VG-ramus intemedius; patent VG-RCA wth 70% mid post lat stenosis  . Cardiac catheterization  03/16/2006    patent LIMA, patent VGs;  EF 30%  . Cardioversion  10/12/2010    successful cardioversion  . Coronary angioplasty  [phone]    LAD;Inf MI PTCA-RCA; ant MI PTCA- LAD    Family History  Problem Relation Age of Onset  . Heart disease Father     History   Social History  . Marital Status: Widowed    Spouse Name: N/A    Number of Children: N/A  . 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 Smoking status: Never Smoker   . Smokeless tobacco: Never Used  . Alcohol Use: 8.4 oz/week    14 Glasses of wine per week     Comment: 01/08/14- 4 glasses of wine qwk  . Drug Use: No  . Sexual Activity: No   Other Topics Concern  . Not on file   Social History Narrative  . No narrative on file    Review of systems: Fatigue and dyspnea, a little worse than at his last  appointment  PHYSICAL EXAM BP 103/55  Pulse 70  Resp 16  Ht 5\' 11"  (1.803 m)  Wt 165 lb (74.844 kg)  BMI 23.02 kg/m2 The patient specifically denies any chest pain at rest or with exertion, dyspnea at rest, orthopnea, paroxysmal nocturnal dyspnea, syncope, palpitations, focal neurological deficits, intermittent claudication, lower extremity edema, unexplained weight gain, cough, hemoptysis or wheezing.  The patient also denies abdominal pain, nausea, vomiting, dysphagia, diarrhea, constipation, polyuria, polydipsia, dysuria, hematuria, frequency, urgency, abnormal bleeding or bruising, fever, chills, unexpected weight changes, mood swings, change in skin or hair texture, change in voice quality, auditory or visual problems, allergic reactions or rashes. Right foot redness and swelling as described above  Lipid Panel     Component Value Date/Time   CHOL 152 12/13/2012 0950   TRIG 159* 12/13/2012 0950   HDL 28* 12/13/2012 0950   CHOLHDL 5.4 12/13/2012 0950   VLDL 32 12/13/2012 0950   LDLCALC 92 12/13/2012 0950    BMET    Component Value Date/Time   NA 137 01/30/2014 1807   K 3.6 01/30/2014 1807   CL 97 01/30/2014 1807   CO2 29 01/30/2014 1807   GLUCOSE 129* 01/30/2014 1807   BUN 25* 01/30/2014 1807   CREATININE 1.19 01/30/2014 1807   CREATININE 1.20 12/31/2013 0415   CREATININE 1.19 06/19/2012 1119   CALCIUM 8.3* 01/30/2014 1807   GFRNONAA 45* 01/08/2014 1346   GFRNONAA 53* 12/31/2013 0415   GFRAA 52* 01/08/2014 1346   GFRAA 61* 12/31/2013 0415     ASSESSMENT AND PLAN Chronic combined systolic and diastolic congestive heart failure, NYHA class 2  He has a little worsening of his functional status, is approaching functional class III. Will increase the dose of metolazone slightly to 2 doses a week with a target weight of around 160 pounds. Unfortunately this may lead to increasing frequency of gout attacks.  Gout attack  Currently inactive. Allopurinol was recently started  Biventricular  cardiac pacemaker in situ  Normal device  function. No permanent reprogramming changes performed.  LV lead pacing configuration is mid3 to RV ring. This configuration has a relatively high pacing threshold (1.75 V at 1.0 ms pulse width) but appears to provide optimal resynchronization.   Atrial fibrillation, permanent  Appropriate warfarin anticoagulation with therapeutic levels. He has not had any more signs of gastrointestinal bleeding. Will recheck his hemoglobin, which had increased to 10.1.    CAD- CABG X 4 '95, patent grafts '09  No symptoms to suggest coronary insufficiency at this time  Colonic polyps with high-grade dysplasia While I do not think there is a prohibitive risk of cardiac complications with abdominal surgery, the risk is probably moderate. He needs to be "fine tuning" from a hemodynamic standpoint before having surgery. I did recommend that they go ahead and have him meet a surgeon to discuss potential surgical procedures. I will then be able to talk to his surgeon about the risk. His warfarin anticoagulation obviously would need to be stopped, but he has never had embolic events in the past.  Orders Placed This Encounter  Procedures  . CBC  . Basic metabolic panel  . Implantable device check   Meds ordered this encounter  Medications  . metolazone (ZAROXOLYN) 2.5 MG tablet    Sig: Take 2 times a week or as needed to keep weight down to less than 160 lbs.    Dispense:  30 tablet    Refill:  3    Lezlie Ritchey  [name], MD, [facility] [phone] office [phone] pager

## 2014-04-22 NOTE — Patient Instructions (Signed)
Increase Metolazone to 2 times a week to keep weight below 160 lbs.  Dr. Sallyanne Kuster recommends that you schedule a follow-up appointment in: July after his return from vacation.

## 2014-04-23 ENCOUNTER — Telehealth: Payer: Self-pay | Admitting: *Deleted

## 2014-04-23 DIAGNOSIS — Z79899 Other long term (current) drug therapy: Secondary | ICD-10-CM

## 2014-04-23 LAB — BASIC METABOLIC PANEL
BUN: 26 mg/dL — ABNORMAL HIGH (ref 6–23)
CO2: 28 meq/L (ref 19–32)
Calcium: 8.5 mg/dL (ref 8.4–10.5)
Chloride: 99 mEq/L (ref 96–112)
Creat: 1.18 mg/dL (ref 0.50–1.35)
GLUCOSE: 133 mg/dL — AB (ref 70–99)
Potassium: 3.3 mEq/L — ABNORMAL LOW (ref 3.5–5.3)
SODIUM: 139 meq/L (ref 135–145)

## 2014-04-23 MED ORDER — POTASSIUM CHLORIDE CRYS ER 10 MEQ PO TBCR: 40.0000 meq | EXTENDED_RELEASE_TABLET | Freq: Every day | ORAL | Status: DC

## 2014-04-23 NOTE — Telephone Encounter (Signed)
Lab results called to patient.  Instructed to increase K+ to 67meq daily and have labs rechecked in 2 weeks. Lab order mailed to patient.  Voiced understanding.

## 2014-04-23 NOTE — Telephone Encounter (Signed)
Message copied by Tressa Busman on Wed Apr 23, 2014  9:31 AM ------      Message from: [name]      Created: Wed Apr 23, 2014  8:02 AM       Hemoglobin better. Please take an additional 20 mEq of KCl daily and recheck BMET in 2 weeks ------

## 2014-05-08 LAB — BASIC METABOLIC PANEL
BUN: 33 mg/dL — ABNORMAL HIGH (ref 6–23)
CALCIUM: 9.3 mg/dL (ref 8.4–10.5)
CHLORIDE: 93 meq/L — AB (ref 96–112)
CO2: 32 mEq/L (ref 19–32)
CREATININE: 1.23 mg/dL (ref 0.50–1.35)
Glucose, Bld: 132 mg/dL — ABNORMAL HIGH (ref 70–99)
Potassium: 3.1 mEq/L — ABNORMAL LOW (ref 3.5–5.3)
Sodium: 136 mEq/L (ref 135–145)

## 2014-05-20 ENCOUNTER — Ambulatory Visit (INDEPENDENT_AMBULATORY_CARE_PROVIDER_SITE_OTHER): Payer: Medicare Other | Admitting: *Deleted

## 2014-05-20 ENCOUNTER — Encounter: Payer: Self-pay | Admitting: Cardiovascular Disease

## 2014-05-20 ENCOUNTER — Ambulatory Visit (INDEPENDENT_AMBULATORY_CARE_PROVIDER_SITE_OTHER): Payer: Medicare Other | Admitting: Cardiovascular Disease

## 2014-05-20 VITALS — BP 110/50 | HR 70 | Resp 16 | Ht 71.0 in | Wt 164.5 lb

## 2014-05-20 DIAGNOSIS — Z950 Presence of cardiac pacemaker: Secondary | ICD-10-CM

## 2014-05-20 DIAGNOSIS — I4891 Unspecified atrial fibrillation: Secondary | ICD-10-CM

## 2014-05-20 DIAGNOSIS — I2589 Other forms of chronic ischemic heart disease: Secondary | ICD-10-CM

## 2014-05-20 DIAGNOSIS — Z01810 Encounter for preprocedural cardiovascular examination: Secondary | ICD-10-CM

## 2014-05-20 DIAGNOSIS — Z7901 Long term (current) use of anticoagulants: Secondary | ICD-10-CM

## 2014-05-20 DIAGNOSIS — I5042 Chronic combined systolic (congestive) and diastolic (congestive) heart failure: Secondary | ICD-10-CM

## 2014-05-20 DIAGNOSIS — I509 Heart failure, unspecified: Secondary | ICD-10-CM

## 2014-05-20 DIAGNOSIS — N179 Acute kidney failure, unspecified: Secondary | ICD-10-CM

## 2014-05-20 DIAGNOSIS — I255 Ischemic cardiomyopathy: Secondary | ICD-10-CM

## 2014-05-20 DIAGNOSIS — I4821 Permanent atrial fibrillation: Secondary | ICD-10-CM

## 2014-05-20 DIAGNOSIS — I2510 Atherosclerotic heart disease of native coronary artery without angina pectoris: Secondary | ICD-10-CM

## 2014-05-20 DIAGNOSIS — Z79899 Other long term (current) drug therapy: Secondary | ICD-10-CM

## 2014-05-20 DIAGNOSIS — J449 Chronic obstructive pulmonary disease, unspecified: Secondary | ICD-10-CM

## 2014-05-20 DIAGNOSIS — J4489 Other specified chronic obstructive pulmonary disease: Secondary | ICD-10-CM

## 2014-05-20 LAB — MDC_IDC_ENUM_SESS_TYPE_INCLINIC
Implantable Pulse Generator Serial Number: 7515957
Lead Channel Impedance Value: 610 Ohm
Lead Channel Pacing Threshold Pulse Width: 0.4 ms
Lead Channel Pacing Threshold Pulse Width: 1 ms
Lead Channel Setting Pacing Amplitude: 2.75 V
Lead Channel Setting Sensing Sensitivity: 6 mV
MDC IDC MSMT BATTERY REMAINING PERCENTAGE: 95 % — AB
MDC IDC MSMT LEADCHNL LV PACING THRESHOLD AMPLITUDE: 1.75 V
MDC IDC MSMT LEADCHNL RV IMPEDANCE VALUE: 480 Ohm
MDC IDC MSMT LEADCHNL RV PACING THRESHOLD AMPLITUDE: 1 V
MDC IDC PG MODEL: 3242
MDC IDC SET LEADCHNL LV PACING PULSEWIDTH: 1 ms
MDC IDC SET LEADCHNL RV PACING AMPLITUDE: 2.5 V
MDC IDC SET LEADCHNL RV PACING PULSEWIDTH: 0.4 ms
MDC IDC STAT BRADY RV PERCENT PACED: 99 %

## 2014-05-20 LAB — PACEMAKER DEVICE OBSERVATION

## 2014-05-20 LAB — POCT INR: INR: 1.2

## 2014-05-20 NOTE — Patient Instructions (Signed)
Increase Warfarin to 1/2 tablet daily  Except 5mg   Mon/Wed/Friday.  Have an INR checked here in one week.    Dr. Sallyanne Kuster recommends that you schedule a follow-up appointment in: 2 months.

## 2014-05-20 NOTE — Patient Instructions (Signed)
Take 2.5mg  every day except 5mg  (1 tablet) on Monday, Wednesday, Friday.  Repeat INR on July 15th with Gay Filler, Pharmacist

## 2014-05-25 ENCOUNTER — Encounter: Payer: Self-pay | Admitting: Cardiovascular Disease

## 2014-05-25 DIAGNOSIS — Z01810 Encounter for preprocedural cardiovascular examination: Secondary | ICD-10-CM | POA: Insufficient documentation

## 2014-05-25 NOTE — Progress Notes (Signed)
Patient ID: [name], male   DOB: 10/25/1926, 80 y.o.   MRN: [id number]      Reason for office visit Congestive heart failure  Although [name] has not returned to his previous functional level before his pacemaker change out, he has achieved a good degree of stability. It seems now that his weight is mostly between 153 and 157 pounds. He will easily gained 45 pounds overnight, but will promptly responds to a "booster" dose of metolazone, which he needs to take no more than once a week on the average. His thoracic impedance by his CRT-P. device is within the normal range.   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  Interrogation of his biventricular pacemaker today shows normal device function with 100% ventricular pacing and no recorded ventricular arrhythmia. Lead parameters remain at their historical levels.  He has underlying complete heart block and is pacemaker dependent. He has ischemic cardiomyopathy with a left ventricular ejection fraction around 35% after undergoing bypass surgery in 1995. His cardiac catheterization in 2009 showed all his grafts to be widely patent.  He had a biventricular device but developed pocket erosion roughly a year after generator change and had to be explanted in July 2014. He had a "temporary permanent" ventricular pacemaker placed percutaneously for a few weeks. This was complicated by deep venous thrombosis involving the left internal jugular and subclavian vein. A new CRT pacemaker was implanted but during the interval between the 2 devices he developed fairly significant and hard to manage congestive heart failure. Some improvement occurred after the new biventricular device was implanted, but he seemed to be receiving less successful resynchronization therapy, likely due to the more limited pacing vectors available with CRT P versus CRT-D. He required  rehospitalization in February 2015 for acute heart failure with hyponatremia and worsening renal function. LV pacing was changed from LV3 to can with simultaneous RV and LV pacing, Reprogrammed LV3 to RV ring with LV first by 51msec. Since then his heart failure has been easier to manage, even though he still requires higher doses of diuretics than in the past. Recently he was seen in the office for lower extremity edema and weight gain and shortness of breath. We started treatment with metolazone to which he responded promptly.  He has permanent atrial fibrillation on chronic warfarin anticoagulation.  He has pulmonary fibrosis, presumed to be asbestosis.  As well treated hypertension and hyperlipidemia and compensated hypothyroidism.  No Known Allergies  Current Outpatient Prescriptions  Medication Sig Dispense Refill  . albuterol (PROVENTIL HFA;VENTOLIN HFA) 108 (90 BASE) MCG/ACT inhaler Inhale 2 puffs into the lungs every 6 (six) hours as needed for wheezing or shortness of breath.      . allopurinol (ZYLOPRIM) 300 MG tablet Take 1 tablet (300 mg total) by mouth daily.  30 tablet  6  . beta carotene w/minerals (OCUVITE) tablet Take 1 tablet by mouth daily.      . bimatoprost (LUMIGAN) 0.03 % ophthalmic solution Place 1 drop into both eyes at bedtime.       . captopril (CAPOTEN) 12.5 MG tablet Take 1/2 tablet twice a day.  30 tablet  6  . colchicine 0.6 MG tablet Take 1 tablet (0.6 mg total) by mouth daily. Only take 5-6 days for each episode.  30 tablet  5  . digoxin (LANOXIN) 0.125 MG tablet Take 1 tablet (125 mcg total) by mouth at  bedtime.  90 tablet  3  . dorzolamide-timolol (COSOPT) 22.3-6.8 MG/ML ophthalmic solution Place 1 drop into both eyes 2 (two) times daily.       . furosemide (LASIX) 80 MG tablet Take one tablet (80mg ) in the AM and 1/2 tablet (40mg ) in the afternoon.  135 tablet  3  . Iron-Vitamin C (VITRON-C PO) Take by mouth daily.      Marland Kitchen levothyroxine (SYNTHROID, LEVOTHROID)  125 MCG tablet Take 125 mcg by mouth daily before breakfast.      . magnesium oxide (MAG-OX) 400 MG tablet Take 400 mg by mouth daily.      . metolazone (ZAROXOLYN) 2.5 MG tablet Take 2 times a week or as needed to keep weight down to less than 160 lbs.  30 tablet  3  . pantoprazole (PROTONIX) 40 MG tablet Take 1 tablet (40 mg total) by mouth daily.  30 tablet  11  . potassium chloride (K-DUR,KLOR-CON) 10 MEQ tablet Take 4 tablets (40 mEq total) by mouth daily.  120 tablet  5  . SYMBICORT 160-4.5 MCG/ACT inhaler INHALE 2 PUFFS 2 TIMES DAILY  10.2 g  3  . warfarin (COUMADIN) 5 MG tablet take 1/2 tablet daily.       No current facility-administered medications for this visit.    Past Medical History  Diagnosis Date  . Ischemic cardiomyopathy     EF-35-45%by echo in 2011; first dx in the 90's but again in 2009 & rec'd 1st BiV ICD   . Paroxysmal atrial fibrillation     sinus rhythm on amiodarone  . Asbestos exposure     plaques on CT  . Compression fracture     T7  . Hypertension   . Coronary artery disease     last cath 2009, hx CABG  . Automatic implantable cardioverter-defibrillator in situ     now explanted  . Hypothyroidism   . CHF (congestive heart failure)     hx.  . Myocardial infarction     INFERIOR [phone] wth PTCA; Ant MI 1995 wth PTCA   . Shortness of breath     "only when external pacemaker wasn't working right" (06/17/2013)  . Arthritis     "a touch all over" (06/17/2013)  . COPD (chronic obstructive pulmonary disease)     "a touch" (8/4//2014)  . Atrial fibrillation, permanent 07/22/2013  . Biventricular cardiac pacemaker in situ 06/2013    St. Jude Allura (pt no longer wished for ICD portion)  . Pacemaker     Past Surgical History  Procedure Laterality Date  . Inguinal hernia repair Right   . Coronary artery bypass graft  11/01/1994    SVG to first diagonal, to distal RCA, to the ramus intermedius artery, and LIMA the LAD  . Insert / replace / remove pacemaker   10/05/1998    Original pacemaker 1999;  gen change 2006; upgrade to BiV ICD 2009, gen change 10/2012;  explantation for skin erosion with chronic infection and temp PM placed until new device placed    . Cataract extraction w/ intraocular lens  implant, bilateral Bilateral   . Icd lead removal Left 05/27/2013    Procedure: ICD LEAD REMOVAL;  Surgeon: Evans Lance, MD;  Location: [place] [facility];  Service: Cardiovascular;  Laterality: Left;  . Bi-ventricular pacemaker insertion (crt-p) Right 06/17/2013    St Jude  . Pacemaker removal Left 05/2013  . Coronary angioplasty with stent placement  12/17/2001    to VG-diag wth Zeta stent  . Cardiac  catheterization  2009    Patent LIMA-LAD; patent VG-diag wth mild in-stent restenosis; patent VG-ramus intemedius; patent VG-RCA wth 70% mid post lat stenosis  . Cardiac catheterization  03/16/2006    patent LIMA, patent VGs;  EF 30%  . Cardioversion  10/12/2010    successful cardioversion  . Coronary angioplasty  [phone]    LAD;Inf MI PTCA-RCA; ant MI PTCA- LAD    Family History  Problem Relation Age of Onset  . Heart disease Father     History   Social History  . Marital Status: Widowed    Spouse Name: N/A    Number of Children: N/A  . 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 Smoking status: Never Smoker   . Smokeless tobacco: Never Used  . Alcohol Use: 8.4 oz/week    14 Glasses of wine per week     Comment: 01/08/14- 4 glasses of wine qwk  . Drug Use: No  . Sexual Activity: No   Other Topics Concern  . Not on file   Social History Narrative  . No narrative on file    Review of systems: Dyspnea with exertion, usually class 2  The patient specifically denies any chest pain at rest or with exertion, dyspnea at rest, orthopnea, paroxysmal nocturnal dyspnea, syncope, palpitations, focal neurological deficits, intermittent claudication, lower extremity edema,  unexplained weight gain, cough, hemoptysis or wheezing.  The patient also denies abdominal pain, nausea, vomiting, dysphagia, diarrhea, constipation, polyuria, polydipsia, dysuria, hematuria, frequency, urgency, abnormal bleeding or bruising, fever, chills, unexpected weight changes, mood swings, change in skin or hair texture, change in voice quality, auditory or visual problems, allergic reactions or rashes. Right foot redness and swelling as described above   PHYSICAL EXAM BP 110/50  Pulse 70  Resp 16  Ht 5\' 11"  (1.803 m)  Wt 164 lb 8 oz (74.617 kg)  BMI 22.95 kg/m2 General: Alert, oriented x3, no acutedistress  Head: no evidence of trauma, PERRL, EOMI, no exophtalmos or lid lag, no myxedema, no xanthelasma; normal ears, nose and oropharynx  Neck: jugular venous pulsations are elevated 5-6 cm and there is minimal hepatojugular reflux; v waves are very prominent; brisk carotid pulses without delay and no carotid bruits  Chest: clear to auscultation, no signs of consolidation by percussion or palpation, normal fremitus, symmetrical and full respiratory excursions. Both the old device sites and the new CRT pacemaker site appeared healthy without evidence of infection  Cardiovascular: normal position and quality of the apical impulse, regular rhythm, normal first and paradoxically split second heart sounds, no rubs. S3 gallop present, 2 / 6 holosystolic murmur at the left lower sternal border  Abdomen: no tenderness or distention, no masses by palpation, no abnormal pulsatility or arterial bruits, normal bowel sounds, no hepatosplenomegaly  Extremities: no clubbing, cyanosis or edema; 2+ radial, ulnar and brachial pulses bilaterally; 2+ right femoral, posterior tibial and dorsalis pedis pulses; 2+ left femoral, posterior tibial and dorsalis pedis pulses; no subclavian or femoral bruits  Neurological: grossly nonfocal   EKG: Permanent atrial fibrillation, biventricular pacing   Lipid Panel       Component Value Date/Time   CHOL 152 12/13/2012 0950   TRIG 159* 12/13/2012 0950   HDL 28* 12/13/2012 0950   CHOLHDL 5.4 12/13/2012 0950   VLDL 32 12/13/2012 0950   LDLCALC 92 12/13/2012 0950    BMET    Component Value Date/Time  NA 136 05/07/2014 1516   K 3.1* 05/07/2014 1516   CL 93* 05/07/2014 1516   CO2 32 05/07/2014 1516   GLUCOSE 132* 05/07/2014 1516   BUN 33* 05/07/2014 1516   CREATININE 1.23 05/07/2014 1516   CREATININE 1.20 12/31/2013 0415   CREATININE 1.19 06/19/2012 1119   CALCIUM 9.3 05/07/2014 1516   GFRNONAA 45* 01/08/2014 1346   GFRNONAA 53* 12/31/2013 0415   GFRAA 52* 01/08/2014 1346   GFRAA 61* 12/31/2013 0415     ASSESSMENT AND PLAN  Chronic combined systolic and diastolic congestive heart failure, NYHA class 2  Stable course for several months now.We'll reestablish his dry weight to under 160 pounds.    Biventricular cardiac pacemaker in situ  Normal device function. No permanent reprogramming changes performed.  LV lead pacing configuration is mid3 to RV ring. This configuration has a relatively high pacing threshold (1.75 V at 1.0 ms pulse width) but appears to provide optimal resynchronization.   Atrial fibrillation, permanent  Appropriate warfarin anticoagulation with therapeutic levels. Hemoglobin appears to be holding state. He had anemia related to bleeding from his colon.  CAD- CABG X 4 '95, patent grafts '09  No symptoms to suggest coronary insufficiency at this time  Surgical risk evaluation Kit is clearly at increased surgical risk due to this complicated and severe cardiac problems. On the other hand he has been quite stable now for several months. I would judge his overall surgical risk for major cardiovascular complications to the moderate to high, but not prohibitive. Most likely, he'll be at risk of heart failure compensation. I think he has a better than 90% chance of "pulling through" partial colectomy, but there is a substantial risk of serious  complications and further deterioration in functional status. He does not have colon cancer at this point, and stents to have a curative surgical resection of his colon. On the other hand he is in his late 109s with advanced heart disease and it is really hard to say whether her colon cancer will increase his mortality compared to his cardiac problems.  Orders Placed This Encounter  Procedures  . Basic metabolic panel  . EKG 12-Lead   Meds ordered this encounter  Medications  . warfarin (COUMADIN) 5 MG tablet    Sig: take 1/2 tablet daily.    Holli Humbles, MD, [place] [phone] office [phone] pager

## 2014-05-27 ENCOUNTER — Ambulatory Visit (INDEPENDENT_AMBULATORY_CARE_PROVIDER_SITE_OTHER): Payer: Medicare Other | Admitting: Surgery

## 2014-05-27 ENCOUNTER — Other Ambulatory Visit (INDEPENDENT_AMBULATORY_CARE_PROVIDER_SITE_OTHER): Payer: Self-pay | Admitting: Surgery

## 2014-05-27 ENCOUNTER — Ambulatory Visit (INDEPENDENT_AMBULATORY_CARE_PROVIDER_SITE_OTHER): Payer: Medicare Other | Admitting: *Deleted

## 2014-05-27 ENCOUNTER — Encounter (INDEPENDENT_AMBULATORY_CARE_PROVIDER_SITE_OTHER): Payer: Self-pay | Admitting: Surgery

## 2014-05-27 ENCOUNTER — Telehealth (INDEPENDENT_AMBULATORY_CARE_PROVIDER_SITE_OTHER): Payer: Self-pay

## 2014-05-27 VITALS — BP 108/68 | HR 76 | Temp 97.0°F | Ht 71.0 in | Wt 156.0 lb

## 2014-05-27 DIAGNOSIS — Z7901 Long term (current) use of anticoagulants: Secondary | ICD-10-CM

## 2014-05-27 DIAGNOSIS — I4821 Permanent atrial fibrillation: Secondary | ICD-10-CM

## 2014-05-27 DIAGNOSIS — D126 Benign neoplasm of colon, unspecified: Secondary | ICD-10-CM

## 2014-05-27 DIAGNOSIS — I4891 Unspecified atrial fibrillation: Secondary | ICD-10-CM

## 2014-05-27 LAB — POCT INR: INR: 1.4

## 2014-05-27 NOTE — Patient Instructions (Signed)
COLON BOWEL PREP                                                                          °Please follow the instructions carefully. It is important to clean out your bowels & take the prescribed antibiotic pills to lower your chances of a wound infection or abscess.  ° °FIVE DAYS PRIOR TO YOUR SURGERY ° °Stop eating any nuts, popcorn, or fruit with seeds. Stop all fiber supplements such as Metamucil, Citrucel, etc.   °Hold taking any blood thinning anticoagulation medication (ex: aspirin, warfarin/Coumadin, Plavix, Xarelto, Eliquis, Pradaxa, etc) as recommended by your medical/cardiology doctor ° °Obtain what you need at a pharmacy of your choice: °-Filled out prescriptions for your oral antibiotics (Neomycin & Metronidazole)  °-A bottle of MiraLax / Glycolax (288g) - no prescription required  °-A large bottle of Gatorade / Powerade (64oz)  °-Dulcolax tablets (4 tabs) - no prescription required  ° ° °DAY PRIOR TO SURGERY  ° °7:00am °Swallow 4 Dulcolax tablets with some water °Drink plenty of clear liquids all day to avoid getting dehydrated (Water, juice, soda, coffee, tea, bouillon, jello, etc.) ° °10:00am °Mix the bottle of MiraLax with the 64-oz bottle of Gatorade.  Drink the Gatorade mixture gradually over the next few hours (8oz glass every 15-30 minutes) until gone. You should finish by 2pm. ° °2:00pm °Take 2 Neomycin 500mg tablets & 2 Metronidazole 500mg tablets ° °3:00pm °Take 2 Neomycin 500mg tablets & 2 Metronidazole 500mg tablets  °Drink plenty of clear liquids all evening to avoid getting dehydrated ° °10:00pm °Take 2 Neomycin 500mg tablets & 2 Metronidazole 500mg tablets  °Do not eat or drink anything after bedtime (midnight) the night before your surgery. ° ° °MORNING OF SURGERY °Remember to not to drink or eat anything that morning  °Hold or take medications as recommended by the hospital staff at your Preoperative visit ° °If you have questions or concerns, please call [facility] [phone] during business hours to speak to the clinical staff for advice. °

## 2014-05-27 NOTE — Progress Notes (Signed)
General Surgery [name]  Chief Complaint  Patient presents with  . New Evaluation    tubulovillous adenomas of colon with high grade dysplasia - referral from Dr. Clarene Essex    HISTORY: Patient is a 80 year old male referred by his gastroenterologist for tubulovillous adenomas of the colon with high-grade dysplasia. Patient had presented with anemia with a low hemoglobin of 9.1. He underwent evaluation including a CT scan, an upper endoscopy, and a colonoscopy. Colonoscopy showed multiple polyps. There were sessile lesions in the cecum, ascending colon, and transverse colon which were not amenable to colonoscopic resection. Biopsies were taken and demonstrated tubulovillous adenoma with high-grade dysplasia identified in the polyp in the transverse colon. The transverse colon polyps were marked with [place] ink.  Patient has a significant cardiac history with coronary artery bypass grafting on 2 occasions, a pacemaker upon which he is dependent, and a history of congestive heart failure. He is followed closely by his cardiologist.  Patient presents today with both of his daughters to discuss the option of surgical resection of the right and transverse colon for management of tubulovillous adenoma with high-grade dysplasia and possible occult colon cancer.  Past Medical History  Diagnosis Date  . Ischemic cardiomyopathy     EF-35-45%by echo in 2011; first dx in the 90's but again in 2009 & rec'd 1st BiV ICD   . Paroxysmal atrial fibrillation     sinus rhythm on amiodarone  . Asbestos exposure     plaques on CT  . Compression fracture     T7  . Hypertension   . Coronary artery disease     last cath 2009, hx CABG  . Automatic implantable cardioverter-defibrillator in situ     now explanted  . Hypothyroidism   . CHF (congestive heart failure)     hx.  . Myocardial infarction     INFERIOR [phone] wth PTCA; Ant MI 1995 wth PTCA   . Shortness of breath     "only  when external pacemaker wasn't working right" (06/17/2013)  . Arthritis     "a touch all over" (06/17/2013)  . COPD (chronic obstructive pulmonary disease)     "a touch" (8/4//2014)  . Atrial fibrillation, permanent 07/22/2013  . Biventricular cardiac pacemaker in situ 06/2013    St. Jude Allura (pt no longer wished for ICD portion)  . Pacemaker     Current Outpatient Prescriptions  Medication Sig Dispense Refill  . albuterol (PROVENTIL HFA;VENTOLIN HFA) 108 (90 BASE) MCG/ACT inhaler Inhale 2 puffs into the lungs every 6 (six) hours as needed for wheezing or shortness of breath.      . allopurinol (ZYLOPRIM) 300 MG tablet Take 1 tablet (300 mg total) by mouth daily.  30 tablet  6  . beta carotene w/minerals (OCUVITE) tablet Take 1 tablet by mouth daily.      . bimatoprost (LUMIGAN) 0.03 % ophthalmic solution Place 1 drop into both eyes at bedtime.       . captopril (CAPOTEN) 12.5 MG tablet Take 1/2 tablet twice a day.  30 tablet  6  . colchicine 0.6 MG tablet Take 1 tablet (0.6 mg total) by mouth daily. Only take 5-6 days for each episode.  30 tablet  5  . digoxin (LANOXIN) 0.125 MG tablet Take 1 tablet (125 mcg total) by mouth at bedtime.  90 tablet  3  . dorzolamide-timolol (COSOPT) 22.3-6.8 MG/ML ophthalmic solution Place 1 drop into both eyes 2 (two) times daily.       Marland Kitchen  furosemide (LASIX) 80 MG tablet Take one tablet (80mg ) in the AM and 1/2 tablet (40mg ) in the afternoon.  135 tablet  3  . Iron-Vitamin C (VITRON-C PO) Take by mouth daily.      Marland Kitchen levothyroxine (SYNTHROID, LEVOTHROID) 125 MCG tablet Take 125 mcg by mouth daily before breakfast.      . magnesium oxide (MAG-OX) 400 MG tablet Take 400 mg by mouth daily.      . metolazone (ZAROXOLYN) 2.5 MG tablet Take 2 times a week or as needed to keep weight down to less than 160 lbs.  30 tablet  3  . pantoprazole (PROTONIX) 40 MG tablet Take 1 tablet (40 mg total) by mouth daily.  30 tablet  11  . potassium chloride (K-DUR,KLOR-CON) 10 MEQ  tablet Take 4 tablets (40 mEq total) by mouth daily.  120 tablet  5  . SYMBICORT 160-4.5 MCG/ACT inhaler INHALE 2 PUFFS 2 TIMES DAILY  10.2 g  3  . warfarin (COUMADIN) 5 MG tablet take 1 tab m, wed, frid       No current facility-administered medications for this visit.    No Known Allergies  Family History  Problem Relation Age of Onset  . Heart disease Father     History   Social History  . Marital Status: Widowed    Spouse Name: N/A    Number of Children: N/A  . 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 Smoking status: Never Smoker   . Smokeless tobacco: Never Used  . Alcohol Use: 8.4 oz/week    14 Glasses of wine per week     Comment: 01/08/14- 4 glasses of wine qwk  . Drug Use: No  . Sexual Activity: No   Other Topics Concern  . None   Social History Narrative  . None    REVIEW OF SYSTEMS - PERTINENT POSITIVES ONLY: Patient notes chronic constipation. Rare bleeding per rectum. No abdominal pain.  EXAM: Filed Vitals:   05/27/14 1146  BP: 108/68  Pulse: 76  Temp: 97 F (36.1 C)    GENERAL: Frail appearing, no acute distress HEENT: normocephalic; pupils equal and reactive; sclerae clear; dentition good; mucous membranes moist NECK:  No palpable masses in the thyroid bed; symmetric on extension; no palpable anterior or posterior cervical lymphadenopathy; no supraclavicular masses; no tenderness CHEST: clear to auscultation bilaterally without rales, rhonchi, or wheezes CARDIAC: regular rate and rhythm without significant murmur; peripheral pulses are full ABDOMEN: soft without distension; bowel sounds present; no mass; no hepatosplenomegaly; no hernia EXT:  non-tender without edema; no deformity NEURO: no gross focal deficits; no sign of tremor   LABORATORY RESULTS: See [facility] (CHL-Epic) for most recent results  RADIOLOGY RESULTS: See [facility] (CHL-Epic)  for most recent results  IMPRESSION: #1 tubulovillous adenoma of the colon with high-grade dysplasia #2 history of lower GI bleeding with anemia #3 coronary artery disease, status post CABG x2 #4 atrial fibrillation, on anticoagulation #5 pacemaker dependence  PLAN: I had a lengthy discussion with the patient and his 2 daughters. We reviewed all the above issues. I provided him with written literature on colon surgery to review at home. We discussed the multiple issues involved with making a decision about whether to proceed with colonic resection or not. We discussed the risk of the surgery. We discussed the risk of cardiac complications. We discussed the possible outcome if surgery were not  performed.  After extensive consideration, the patient and his daughters believe they would like to proceed with colonic resection. We would arrange for this a Cecilia. We would ask is cardiologist to be very involved with his perioperative management.  Patient would likely require step down or ICU care during his hospital stay. He may require rehabilitation following his acute hospital stay. They are aware of this.  We will coordinate care with his cardiologist and other medical team members in the near future.  The risks and benefits of the procedure have been discussed at length with the patient.  The patient understands the proposed procedure, potential alternative treatments, and the course of recovery to be expected.  All of the patient's questions have been answered at this time.  The patient wishes to proceed with surgery.  Earnstine Regal, MD, [facility]  Primary Care Physician: Thalia Party

## 2014-05-27 NOTE — Progress Notes (Signed)
Pt came into office for his INR check, INR today was 1.4, told pt that sally Putt-Earl will call him and dose him on how he should take his Warfarin.

## 2014-05-27 NOTE — Telephone Encounter (Signed)
Pt given miralax colon prep and aware per Dr Harlow Asa to stop coumadin 5 days before surgery.

## 2014-05-27 NOTE — Addendum Note (Signed)
Addended by: Elberta Leatherwood R on: 05/27/2014 12:02 PM   Modules accepted: Orders, Level of Service

## 2014-05-28 LAB — BASIC METABOLIC PANEL
BUN: 39 mg/dL — AB (ref 6–23)
CO2: 33 mEq/L — ABNORMAL HIGH (ref 19–32)
CREATININE: 1.28 mg/dL (ref 0.50–1.35)
Calcium: 9.3 mg/dL (ref 8.4–10.5)
Chloride: 91 mEq/L — ABNORMAL LOW (ref 96–112)
Glucose, Bld: 162 mg/dL — ABNORMAL HIGH (ref 70–99)
Potassium: 3.3 mEq/L — ABNORMAL LOW (ref 3.5–5.3)
Sodium: 133 mEq/L — ABNORMAL LOW (ref 135–145)

## 2014-05-28 NOTE — Addendum Note (Signed)
Addended by: Aris Georgia on: 05/28/2014 08:34 AM   Modules accepted: Level of Service

## 2014-06-05 ENCOUNTER — Other Ambulatory Visit: Payer: Self-pay | Admitting: Cardiovascular Disease

## 2014-06-09 ENCOUNTER — Ambulatory Visit (INDEPENDENT_AMBULATORY_CARE_PROVIDER_SITE_OTHER): Payer: Medicare Other | Admitting: Pharmacist Clinician (PhC)/ Clinical Pharmacy Specialist

## 2014-06-09 DIAGNOSIS — I4891 Unspecified atrial fibrillation: Secondary | ICD-10-CM

## 2014-06-09 DIAGNOSIS — I4821 Permanent atrial fibrillation: Secondary | ICD-10-CM

## 2014-06-09 DIAGNOSIS — Z7901 Long term (current) use of anticoagulants: Secondary | ICD-10-CM

## 2014-06-09 LAB — POCT INR: INR: 1.8

## 2014-06-10 ENCOUNTER — Encounter (INDEPENDENT_AMBULATORY_CARE_PROVIDER_SITE_OTHER): Payer: Self-pay

## 2014-06-16 ENCOUNTER — Telehealth (INDEPENDENT_AMBULATORY_CARE_PROVIDER_SITE_OTHER): Payer: Self-pay | Admitting: Surgery

## 2014-06-16 NOTE — Telephone Encounter (Signed)
Called patient abut surgery date, he said he wanted after October 13, he will check with his daughters and call us back to schedule.

## 2014-07-02 ENCOUNTER — Ambulatory Visit (INDEPENDENT_AMBULATORY_CARE_PROVIDER_SITE_OTHER): Payer: Medicare Other | Admitting: Pharmacist Clinician (PhC)/ Clinical Pharmacy Specialist

## 2014-07-02 DIAGNOSIS — I4821 Permanent atrial fibrillation: Secondary | ICD-10-CM

## 2014-07-02 DIAGNOSIS — I4891 Unspecified atrial fibrillation: Secondary | ICD-10-CM

## 2014-07-02 DIAGNOSIS — Z7901 Long term (current) use of anticoagulants: Secondary | ICD-10-CM

## 2014-07-02 LAB — POCT INR: INR: 2.3

## 2014-07-24 ENCOUNTER — Encounter: Payer: Medicare Other | Admitting: Cardiovascular Disease

## 2014-07-25 ENCOUNTER — Ambulatory Visit (INDEPENDENT_AMBULATORY_CARE_PROVIDER_SITE_OTHER): Payer: Medicare Other | Admitting: Pharmacist Clinician (PhC)/ Clinical Pharmacy Specialist

## 2014-07-25 DIAGNOSIS — Z7901 Long term (current) use of anticoagulants: Secondary | ICD-10-CM

## 2014-07-25 DIAGNOSIS — I4891 Unspecified atrial fibrillation: Secondary | ICD-10-CM

## 2014-07-25 DIAGNOSIS — I4821 Permanent atrial fibrillation: Secondary | ICD-10-CM

## 2014-07-25 LAB — POCT INR: INR: 2.6

## 2014-07-31 ENCOUNTER — Other Ambulatory Visit: Payer: Self-pay | Admitting: Cardiovascular Disease

## 2014-08-14 ENCOUNTER — Telehealth: Payer: Self-pay | Admitting: Cardiovascular Disease

## 2014-08-14 DIAGNOSIS — R509 Fever, unspecified: Secondary | ICD-10-CM

## 2014-08-14 NOTE — Telephone Encounter (Signed)
Discussed with dr croitoru, Spoke with pt dtr, aware we can check a ua, culture, cbc and cmet

## 2014-08-14 NOTE — Telephone Encounter (Signed)
Spoke with pt dtr, over the weekend the patient mostly slept. He is having trouble with his vision and has now decided he can not drive anymore. He got lost coming back from getting his hair cut. He c/o weakness and cloudy thinking. His PCP recently started him on b complex vit daily They checked his temp this am and it was 99.8. The family wonders if he has a UTI. Will discuss with dr c.

## 2014-08-14 NOTE — Telephone Encounter (Signed)
[name] is calling because her father is seems declining , having  fatique and weakness, cannot see . Please call   Thanks

## 2014-08-15 ENCOUNTER — Telehealth: Payer: Self-pay | Admitting: Cardiovascular Disease

## 2014-08-15 LAB — COMPREHENSIVE METABOLIC PANEL
ALBUMIN: 4.5 g/dL (ref 3.5–5.2)
ALT: 15 U/L (ref 0–53)
AST: 25 U/L (ref 0–37)
Alkaline Phosphatase: 94 U/L (ref 39–117)
BUN: 47 mg/dL — AB (ref 6–23)
CALCIUM: 9.4 mg/dL (ref 8.4–10.5)
CHLORIDE: 92 meq/L — AB (ref 96–112)
CO2: 30 meq/L (ref 19–32)
Creat: 1.4 mg/dL — ABNORMAL HIGH (ref 0.50–1.35)
Glucose, Bld: 167 mg/dL — ABNORMAL HIGH (ref 70–99)
POTASSIUM: 4.8 meq/L (ref 3.5–5.3)
Sodium: 132 mEq/L — ABNORMAL LOW (ref 135–145)
Total Bilirubin: 1.2 mg/dL (ref 0.2–1.2)
Total Protein: 7.1 g/dL (ref 6.0–8.3)

## 2014-08-15 LAB — URINALYSIS
BILIRUBIN URINE: NEGATIVE
GLUCOSE, UA: NEGATIVE mg/dL
HGB URINE DIPSTICK: NEGATIVE
KETONES UR: NEGATIVE mg/dL
Nitrite: NEGATIVE
PROTEIN: NEGATIVE mg/dL
Specific Gravity, Urine: 1.013 (ref 1.005–1.030)
UROBILINOGEN UA: 0.2 mg/dL (ref 0.0–1.0)
pH: 7 (ref 5.0–8.0)

## 2014-08-15 LAB — CBC
HEMATOCRIT: 35.6 % — AB (ref 39.0–52.0)
Hemoglobin: 11.9 g/dL — ABNORMAL LOW (ref 13.0–17.0)
MCH: 31.5 pg (ref 26.0–34.0)
MCHC: 33.4 g/dL (ref 30.0–36.0)
MCV: 94.2 fL (ref 78.0–100.0)
Platelets: 106 10*3/uL — ABNORMAL LOW (ref 150–400)
RBC: 3.78 MIL/uL — AB (ref 4.22–5.81)
RDW: 16 % — AB (ref 11.5–15.5)
WBC: 7.1 10*3/uL (ref 4.0–10.5)

## 2014-08-15 NOTE — Telephone Encounter (Signed)
Pt.s daughter whom is in [place] called concerned about her dads condition, I told her that the labs were in review waiting for Dr. Sallyanne Kuster to release them to the nurse

## 2014-08-15 NOTE — Telephone Encounter (Signed)
[name] would like to speak to the nurse about Mr. Dyke's lab results.

## 2014-08-15 NOTE — Telephone Encounter (Signed)
She wants to know if pt lab results are back from yesterday?

## 2014-08-15 NOTE — Telephone Encounter (Signed)
Pt.s daughter wanted to know if her dad had a UTI , from his labs i saw it looked like if he did it was very mild, let me know what you think and what we need to do next

## 2014-08-16 LAB — URINE CULTURE

## 2014-08-17 ENCOUNTER — Emergency Department (HOSPITAL_COMMUNITY): Payer: Medicare Other

## 2014-08-17 ENCOUNTER — Observation Stay (HOSPITAL_COMMUNITY)
Admission: EM | Admit: 2014-08-17 | Discharge: 2014-08-19 | Disposition: A | Discharge status: Home / Self Care | Payer: Medicare Other | Attending: Internal Medicine | Admitting: Internal Medicine

## 2014-08-17 ENCOUNTER — Encounter (HOSPITAL_COMMUNITY): Payer: Self-pay | Admitting: Emergency Medicine

## 2014-08-17 ENCOUNTER — Encounter: Payer: Self-pay | Admitting: Emergency Medicine

## 2014-08-17 ENCOUNTER — Emergency Department (INDEPENDENT_AMBULATORY_CARE_PROVIDER_SITE_OTHER)
Admission: EM | Admit: 2014-08-17 | Discharge: 2014-08-17 | Disposition: A | Discharge status: Other Acute Inpatient Hospital | Payer: Medicare Other | Source: Home / Self Care | Attending: Family Medicine | Admitting: Family Medicine

## 2014-08-17 DIAGNOSIS — E871 Hypo-osmolality and hyponatremia: Secondary | ICD-10-CM | POA: Diagnosis not present

## 2014-08-17 DIAGNOSIS — Z7951 Long term (current) use of inhaled steroids: Secondary | ICD-10-CM | POA: Insufficient documentation

## 2014-08-17 DIAGNOSIS — R21 Rash and other nonspecific skin eruption: Principal | ICD-10-CM

## 2014-08-17 DIAGNOSIS — Z792 Long term (current) use of antibiotics: Secondary | ICD-10-CM | POA: Insufficient documentation

## 2014-08-17 DIAGNOSIS — Z23 Encounter for immunization: Secondary | ICD-10-CM | POA: Insufficient documentation

## 2014-08-17 DIAGNOSIS — Z79899 Other long term (current) drug therapy: Secondary | ICD-10-CM | POA: Diagnosis not present

## 2014-08-17 DIAGNOSIS — D692 Other nonthrombocytopenic purpura: Secondary | ICD-10-CM

## 2014-08-17 DIAGNOSIS — Z950 Presence of cardiac pacemaker: Secondary | ICD-10-CM | POA: Insufficient documentation

## 2014-08-17 DIAGNOSIS — J449 Chronic obstructive pulmonary disease, unspecified: Secondary | ICD-10-CM | POA: Diagnosis not present

## 2014-08-17 DIAGNOSIS — R233 Spontaneous ecchymoses: Secondary | ICD-10-CM

## 2014-08-17 DIAGNOSIS — E039 Hypothyroidism, unspecified: Secondary | ICD-10-CM | POA: Insufficient documentation

## 2014-08-17 DIAGNOSIS — R600 Localized edema: Secondary | ICD-10-CM

## 2014-08-17 DIAGNOSIS — Z77090 Contact with and (suspected) exposure to asbestos: Secondary | ICD-10-CM | POA: Diagnosis not present

## 2014-08-17 DIAGNOSIS — D696 Thrombocytopenia, unspecified: Secondary | ICD-10-CM | POA: Diagnosis not present

## 2014-08-17 DIAGNOSIS — I4821 Permanent atrial fibrillation: Secondary | ICD-10-CM | POA: Diagnosis present

## 2014-08-17 DIAGNOSIS — I10 Essential (primary) hypertension: Secondary | ICD-10-CM | POA: Insufficient documentation

## 2014-08-17 DIAGNOSIS — Z7901 Long term (current) use of anticoagulants: Secondary | ICD-10-CM | POA: Insufficient documentation

## 2014-08-17 DIAGNOSIS — R531 Weakness: Secondary | ICD-10-CM

## 2014-08-17 DIAGNOSIS — I482 Chronic atrial fibrillation: Secondary | ICD-10-CM | POA: Diagnosis not present

## 2014-08-17 DIAGNOSIS — I25810 Atherosclerosis of coronary artery bypass graft(s) without angina pectoris: Secondary | ICD-10-CM | POA: Diagnosis not present

## 2014-08-17 DIAGNOSIS — E876 Hypokalemia: Secondary | ICD-10-CM | POA: Diagnosis present

## 2014-08-17 DIAGNOSIS — I5042 Chronic combined systolic (congestive) and diastolic (congestive) heart failure: Secondary | ICD-10-CM

## 2014-08-17 DIAGNOSIS — I252 Old myocardial infarction: Secondary | ICD-10-CM | POA: Diagnosis not present

## 2014-08-17 DIAGNOSIS — M1990 Unspecified osteoarthritis, unspecified site: Secondary | ICD-10-CM | POA: Diagnosis not present

## 2014-08-17 DIAGNOSIS — I255 Ischemic cardiomyopathy: Secondary | ICD-10-CM | POA: Diagnosis not present

## 2014-08-17 DIAGNOSIS — I509 Heart failure, unspecified: Secondary | ICD-10-CM | POA: Insufficient documentation

## 2014-08-17 LAB — I-STAT CHEM 8, ED
BUN: 32 mg/dL — ABNORMAL HIGH (ref 6–23)
Calcium, Ion: 1.03 mmol/L — ABNORMAL LOW (ref 1.13–1.30)
Chloride: 90 mEq/L — ABNORMAL LOW (ref 96–112)
Creatinine, Ser: 1.2 mg/dL (ref 0.50–1.35)
GLUCOSE: 113 mg/dL — AB (ref 70–99)
HEMATOCRIT: 39 % (ref 39.0–52.0)
HEMOGLOBIN: 13.3 g/dL (ref 13.0–17.0)
POTASSIUM: 3.1 meq/L — AB (ref 3.7–5.3)
SODIUM: 129 meq/L — AB (ref 137–147)
TCO2: 27 mmol/L (ref 0–100)

## 2014-08-17 LAB — URINALYSIS, ROUTINE W REFLEX MICROSCOPIC
BILIRUBIN URINE: NEGATIVE
Glucose, UA: NEGATIVE mg/dL
HGB URINE DIPSTICK: NEGATIVE
KETONES UR: NEGATIVE mg/dL
Leukocytes, UA: NEGATIVE
NITRITE: NEGATIVE
PROTEIN: NEGATIVE mg/dL
SPECIFIC GRAVITY, URINE: 1.008 (ref 1.005–1.030)
UROBILINOGEN UA: 0.2 mg/dL (ref 0.0–1.0)
pH: 7.5 (ref 5.0–8.0)

## 2014-08-17 LAB — I-STAT TROPONIN, ED: TROPONIN I, POC: 0.06 ng/mL (ref 0.00–0.08)

## 2014-08-17 LAB — DIGOXIN LEVEL: DIGOXIN LVL: 0.8 ng/mL (ref 0.8–2.0)

## 2014-08-17 LAB — COMPREHENSIVE METABOLIC PANEL
ALT: 16 U/L (ref 0–53)
AST: 26 U/L (ref 0–37)
Albumin: 3.4 g/dL — ABNORMAL LOW (ref 3.5–5.2)
Alkaline Phosphatase: 111 U/L (ref 39–117)
Anion gap: 15 (ref 5–15)
BUN: 35 mg/dL — ABNORMAL HIGH (ref 6–23)
CALCIUM: 8.6 mg/dL (ref 8.4–10.5)
CO2: 28 mEq/L (ref 19–32)
Chloride: 87 mEq/L — ABNORMAL LOW (ref 96–112)
Creatinine, Ser: 1.17 mg/dL (ref 0.50–1.35)
GFR, EST AFRICAN AMERICAN: 63 mL/min — AB (ref >90)
GFR, EST NON AFRICAN AMERICAN: 55 mL/min — AB (ref >90)
GLUCOSE: 114 mg/dL — AB (ref 70–99)
Potassium: 3.3 mEq/L — ABNORMAL LOW (ref 3.7–5.3)
SODIUM: 130 meq/L — AB (ref 137–147)
Total Bilirubin: 1.4 mg/dL — ABNORMAL HIGH (ref 0.3–1.2)
Total Protein: 7.4 g/dL (ref 6.0–8.3)

## 2014-08-17 LAB — CBC WITH DIFFERENTIAL/PLATELET
BASOS ABS: 0 10*3/uL (ref 0.0–0.1)
Basophils Relative: 0 % (ref 0–1)
EOS PCT: 2 % (ref 0–5)
Eosinophils Absolute: 0.1 10*3/uL (ref 0.0–0.7)
HCT: 34.8 % — ABNORMAL LOW (ref 39.0–52.0)
Hemoglobin: 11.9 g/dL — ABNORMAL LOW (ref 13.0–17.0)
Lymphocytes Relative: 6 % — ABNORMAL LOW (ref 12–46)
Lymphs Abs: 0.4 10*3/uL — ABNORMAL LOW (ref 0.7–4.0)
MCH: 31.6 pg (ref 26.0–34.0)
MCHC: 34.2 g/dL (ref 30.0–36.0)
MCV: 92.3 fL (ref 78.0–100.0)
MONO ABS: 0.3 10*3/uL (ref 0.1–1.0)
Monocytes Relative: 5 % (ref 3–12)
Neutro Abs: 5.3 10*3/uL (ref 1.7–7.7)
Neutrophils Relative %: 87 % — ABNORMAL HIGH (ref 43–77)
PLATELETS: 129 10*3/uL — AB (ref 150–400)
RBC: 3.77 MIL/uL — ABNORMAL LOW (ref 4.22–5.81)
RDW: 14.9 % (ref 11.5–15.5)
WBC: 6.2 10*3/uL (ref 4.0–10.5)

## 2014-08-17 LAB — PROTIME-INR
INR: 2.28 — ABNORMAL HIGH (ref 0.00–1.49)
PROTHROMBIN TIME: 25.1 s — AB (ref 11.6–15.2)

## 2014-08-17 LAB — SEDIMENTATION RATE: Sed Rate: 38 mm/hr — ABNORMAL HIGH (ref 0–16)

## 2014-08-17 LAB — TSH: TSH: 2.25 u[IU]/mL (ref 0.350–4.500)

## 2014-08-17 MED ORDER — ACETAMINOPHEN 650 MG RE SUPP: 650.0000 mg | Freq: Four times a day (QID) | RECTAL | Status: DC | PRN

## 2014-08-17 MED ORDER — LEVOTHYROXINE SODIUM 125 MCG PO TABS
125.0000 ug | ORAL_TABLET | Freq: Every day | ORAL | Status: DC
  Administered 2014-08-18 – 2014-08-19 (×2): 125 ug via ORAL
  Filled 2014-08-17 (×3): qty 1

## 2014-08-17 MED ORDER — POTASSIUM CHLORIDE CRYS ER 20 MEQ PO TBCR
40.0000 meq | EXTENDED_RELEASE_TABLET | Freq: Every day | ORAL | Status: DC
  Administered 2014-08-18 – 2014-08-19 (×2): 40 meq via ORAL
  Filled 2014-08-17 (×2): qty 2

## 2014-08-17 MED ORDER — DIGOXIN 125 MCG PO TABS
125.0000 ug | ORAL_TABLET | Freq: Every day | ORAL | Status: DC
  Administered 2014-08-17 – 2014-08-18 (×2): 125 ug via ORAL
  Filled 2014-08-17 (×3): qty 1

## 2014-08-17 MED ORDER — POTASSIUM CHLORIDE CRYS ER 20 MEQ PO TBCR
40.0000 meq | EXTENDED_RELEASE_TABLET | Freq: Once | ORAL | Status: AC
  Administered 2014-08-17: 40 meq via ORAL
  Filled 2014-08-17: qty 2

## 2014-08-17 MED ORDER — WARFARIN - PHARMACIST DOSING INPATIENT
Freq: Every day | Status: DC
  Administered 2014-08-18 – 2014-08-19 (×2)

## 2014-08-17 MED ORDER — DORZOLAMIDE HCL-TIMOLOL MAL 2-0.5 % OP SOLN
1.0000 [drp] | Freq: Two times a day (BID) | OPHTHALMIC | Status: DC
  Administered 2014-08-17 – 2014-08-19 (×4): 1 [drp] via OPHTHALMIC
  Filled 2014-08-17: qty 10

## 2014-08-17 MED ORDER — ONDANSETRON HCL 4 MG/2ML IJ SOLN: 4.0000 mg | Freq: Four times a day (QID) | INTRAMUSCULAR | Status: DC | PRN

## 2014-08-17 MED ORDER — ACETAMINOPHEN 325 MG PO TABS
650.0000 mg | ORAL_TABLET | Freq: Four times a day (QID) | ORAL | Status: DC | PRN
  Administered 2014-08-19: 650 mg via ORAL
  Filled 2014-08-17: qty 2

## 2014-08-17 MED ORDER — FUROSEMIDE 40 MG PO TABS: 40.0000 mg | ORAL_TABLET | Freq: Two times a day (BID) | ORAL | Status: DC

## 2014-08-17 MED ORDER — DOCUSATE SODIUM 100 MG PO CAPS
400.0000 mg | ORAL_CAPSULE | Freq: Every day | ORAL | Status: DC
  Administered 2014-08-17 – 2014-08-18 (×2): 400 mg via ORAL
  Filled 2014-08-17 (×3): qty 4

## 2014-08-17 MED ORDER — MAGNESIUM OXIDE 400 (241.3 MG) MG PO TABS
400.0000 mg | ORAL_TABLET | Freq: Every day | ORAL | Status: DC
  Administered 2014-08-18 – 2014-08-19 (×2): 400 mg via ORAL
  Filled 2014-08-17 (×2): qty 1

## 2014-08-17 MED ORDER — ONDANSETRON HCL 4 MG PO TABS
4.0000 mg | ORAL_TABLET | Freq: Four times a day (QID) | ORAL | Status: DC | PRN
Start: 2014-08-17 — End: 2014-08-19

## 2014-08-17 MED ORDER — FUROSEMIDE 40 MG PO TABS
40.0000 mg | ORAL_TABLET | Freq: Every day | ORAL | Status: DC
  Administered 2014-08-17 – 2014-08-19 (×3): 40 mg via ORAL
  Filled 2014-08-17 (×3): qty 1

## 2014-08-17 MED ORDER — ALLOPURINOL 300 MG PO TABS
300.0000 mg | ORAL_TABLET | Freq: Every day | ORAL | Status: DC
  Administered 2014-08-18 – 2014-08-19 (×2): 300 mg via ORAL
  Filled 2014-08-17 (×2): qty 1

## 2014-08-17 MED ORDER — SODIUM CHLORIDE 0.9 % IJ SOLN
3.0000 mL | Freq: Two times a day (BID) | INTRAMUSCULAR | Status: DC
  Administered 2014-08-17 – 2014-08-19 (×4): 3 mL via INTRAVENOUS

## 2014-08-17 MED ORDER — WARFARIN SODIUM 5 MG PO TABS
5.0000 mg | ORAL_TABLET | ORAL | Status: DC
Start: 2014-08-23 — End: 2014-08-19

## 2014-08-17 MED ORDER — PANTOPRAZOLE SODIUM 40 MG PO TBEC
40.0000 mg | DELAYED_RELEASE_TABLET | Freq: Every day | ORAL | Status: DC
  Administered 2014-08-18 – 2014-08-19 (×2): 40 mg via ORAL
  Filled 2014-08-17: qty 1

## 2014-08-17 MED ORDER — FUROSEMIDE 80 MG PO TABS
80.0000 mg | ORAL_TABLET | Freq: Every day | ORAL | Status: DC
Start: 2014-08-18 — End: 2014-08-19
  Administered 2014-08-18 – 2014-08-19 (×2): 80 mg via ORAL
  Filled 2014-08-17 (×4): qty 1

## 2014-08-17 MED ORDER — POTASSIUM CHLORIDE CRYS ER 20 MEQ PO TBCR: 20.0000 meq | EXTENDED_RELEASE_TABLET | Freq: Two times a day (BID) | ORAL | Status: DC

## 2014-08-17 MED ORDER — POTASSIUM CHLORIDE CRYS ER 20 MEQ PO TBCR
20.0000 meq | EXTENDED_RELEASE_TABLET | Freq: Every day | ORAL | Status: DC
  Administered 2014-08-17 – 2014-08-18 (×2): 20 meq via ORAL
  Filled 2014-08-17 (×3): qty 1

## 2014-08-17 MED ORDER — BUDESONIDE-FORMOTEROL FUMARATE 160-4.5 MCG/ACT IN AERO
2.0000 | INHALATION_SPRAY | Freq: Two times a day (BID) | RESPIRATORY_TRACT | Status: DC
  Administered 2014-08-17 – 2014-08-19 (×3): 2 via RESPIRATORY_TRACT
  Filled 2014-08-17: qty 6

## 2014-08-17 MED ORDER — POLYVINYL ALCOHOL 1.4 % OP SOLN
1.0000 [drp] | Freq: Two times a day (BID) | OPHTHALMIC | Status: DC
  Administered 2014-08-17 – 2014-08-19 (×4): 1 [drp] via OPHTHALMIC
  Filled 2014-08-17: qty 15

## 2014-08-17 MED ORDER — INFLUENZA VAC SPLIT QUAD 0.5 ML IM SUSY
0.5000 mL | PREFILLED_SYRINGE | INTRAMUSCULAR | Status: AC
  Administered 2014-08-18: 0.5 mL via INTRAMUSCULAR
  Filled 2014-08-17: qty 0.5

## 2014-08-17 MED ORDER — FUROSEMIDE 80 MG PO TABS
80.0000 mg | ORAL_TABLET | Freq: Every day | ORAL | Status: DC
  Filled 2014-08-17: qty 1

## 2014-08-17 MED ORDER — OCUVITE PO TABS
1.0000 | ORAL_TABLET | Freq: Every day | ORAL | Status: DC
  Administered 2014-08-18 – 2014-08-19 (×2): 1 via ORAL
  Filled 2014-08-17 (×3): qty 1

## 2014-08-17 MED ORDER — MAGNESIUM OXIDE 400 MG PO TABS: 400.0000 mg | ORAL_TABLET | Freq: Every day | ORAL | Status: DC

## 2014-08-17 MED ORDER — CAPTOPRIL 6.25 MG HALF TABLET
6.2500 mg | ORAL_TABLET | Freq: Two times a day (BID) | ORAL | Status: DC
  Administered 2014-08-17 – 2014-08-19 (×4): 6.25 mg via ORAL
  Filled 2014-08-17 (×6): qty 1

## 2014-08-17 MED ORDER — WARFARIN SODIUM 2.5 MG PO TABS
2.5000 mg | ORAL_TABLET | ORAL | Status: DC
  Administered 2014-08-17 – 2014-08-18 (×2): 2.5 mg via ORAL
  Filled 2014-08-17 (×3): qty 1

## 2014-08-17 MED ORDER — LATANOPROST 0.005 % OP SOLN
1.0000 [drp] | Freq: Every day | OPHTHALMIC | Status: DC
  Administered 2014-08-17 – 2014-08-18 (×2): 1 [drp] via OPHTHALMIC
  Filled 2014-08-17: qty 2.5

## 2014-08-17 MED ORDER — FUROSEMIDE 40 MG PO TABS: 40.0000 mg | ORAL_TABLET | Freq: Every day | ORAL | Status: DC

## 2014-08-17 NOTE — ED Notes (Signed)
The pt is  Alert family at bedside.  No pain unless he walks

## 2014-08-17 NOTE — Progress Notes (Signed)
Patient arrived from ED via stretcher. Alert and oriented x 4. V-paced on telemetry. 2 plus edema and red rash bilateral lower extremities. Patient complains of pain in both feet. Unsteady gait. High fall risk precautions started. Heart failure plan of care initiated. Vital signs stable.

## 2014-08-17 NOTE — ED Notes (Signed)
The pt  Is being admitted

## 2014-08-17 NOTE — ED Notes (Signed)
Report given to   The  floor

## 2014-08-17 NOTE — ED Notes (Signed)
Daughter came to stay with pt while caregiver is away.  States onset of reddened painful legs since last night

## 2014-08-17 NOTE — Progress Notes (Signed)
ANTICOAGULATION CONSULT NOTE - Initial Consult  Pharmacy Consult for Coumadin Indication: atrial fibrillation  No Known Allergies  Patient Measurements: Height: 5\' 11"  (180.3 cm) Weight: 153 lb 4.8 oz (69.536 kg) IBW/kg (Calculated) : 75.3  Vital Signs: Temp: 97.9 F (36.6 C) (10/04 1937) Temp Source: Oral (10/04 1937) BP: 127/66 mmHg (10/04 1937) Pulse Rate: 80 (10/04 1937)  Labs:  Recent Labs  08/17/14 1533 08/17/14 1553  HGB 11.9* 13.3  HCT 34.8* 39.0  PLT 129*  --   LABPROT 25.1*  --   INR 2.28*  --   CREATININE 1.17 1.20    Estimated Creatinine Clearance: 43.4 ml/min (by C-G formula based on Cr of 1.2).   Medical History: Past Medical History  Diagnosis Date  . Ischemic cardiomyopathy     EF-35-45%by echo in 2011; first dx in the 90's but again in 2009 & rec'd 1st BiV ICD   . Paroxysmal atrial fibrillation     sinus rhythm on amiodarone  . Asbestos exposure     plaques on CT  . Compression fracture     T7  . Hypertension   . Coronary artery disease     last cath 2009, hx CABG  . Automatic implantable cardioverter-defibrillator in situ     now explanted  . Hypothyroidism   . CHF (congestive heart failure)     hx.  . Myocardial infarction     INFERIOR [phone] wth PTCA; Ant MI 1995 wth PTCA   . Shortness of breath     "only when external pacemaker wasn't working right" (06/17/2013)  . Arthritis     "a touch all over" (06/17/2013)  . COPD (chronic obstructive pulmonary disease)     "a touch" (8/4//2014)  . Atrial fibrillation, permanent 07/22/2013  . Biventricular cardiac pacemaker in situ 06/2013    St. Jude Allura (pt no longer wished for ICD portion)  . Pacemaker     Assessment: 80 year old male on Coumadin PTA for PAF. INR therapeutic on admission.  Admitted for rash on lower leg Dose PTA = 5 mg on Saturdays, 2.5 mg other days Last dose taken 10/3  Goal of Therapy:  INR 2-3 Monitor platelets by anticoagulation protocol: Yes   Plan:  1)  Continue home dose 2) Follow daily INR  Thank you. Anette Guarneri, PharmD [phone]  08/17/2014,7:49 PM

## 2014-08-17 NOTE — Discharge Instructions (Signed)
Thank you for coming in today. Go directly to [facility] emergency room.

## 2014-08-17 NOTE — H&P (Signed)
[facility] History and Physical  LOWERY PAULLIN XFG:[id number] DOB: 1926/02/21 DOA: 08/17/2014  Referring physician: er PCP: Thalia Party   Chief Complaint: LE rash  HPI: [name] is a 80 y.o. male  Who was sent over from the urgent care.  He was seen in the last few days by his cardiologist for weakness, LE edema, and vision changes.  He had a U/A done that showed, trace LE.  He was started on cipro Friday night.  SAturday, his daughter noticed a red rash appearing on his feet and his ankles.   Rash is the same on each leg.   His platelets are about at baseline 130s, his Cr is at baseline, his Na and his K are low.  His INR was therapeutic.  His legs are only painful if he walks Family says he has a fever (98.6) as "his normal" is 97.6--- daughter was adamant this is HIS normal.   No chills, no dysuria Patient was appropriate and answered all questions.  Daughter states he was wheezing yesterday so she gave him an extra metolazone.   No meningeal signs   Review of Systems:  All systems reviewed, negative unless stated above    Past Medical History  Diagnosis Date  . Ischemic cardiomyopathy     EF-35-45%by echo in 2011; first dx in the 90's but again in 2009 & rec'd 1st BiV ICD   . Paroxysmal atrial fibrillation     sinus rhythm on amiodarone  . Asbestos exposure     plaques on CT  . Compression fracture     T7  . Hypertension   . Coronary artery disease     last cath 2009, hx CABG  . Automatic implantable cardioverter-defibrillator in situ     now explanted  . Hypothyroidism   . CHF (congestive heart failure)     hx.  . Myocardial infarction     INFERIOR [phone] wth PTCA; Ant MI 1995 wth PTCA   . Shortness of breath     "only when external pacemaker wasn't working right" (06/17/2013)  . Arthritis     "a touch all over" (06/17/2013)  . COPD (chronic obstructive pulmonary disease)     "a touch" (8/4//2014)  . Atrial fibrillation, permanent 07/22/2013  .  Biventricular cardiac pacemaker in situ 06/2013    St. Jude Allura (pt no longer wished for ICD portion)  . Pacemaker    Past Surgical History  Procedure Laterality Date  . Inguinal hernia repair Right   . Coronary artery bypass graft  11/01/1994    SVG to first diagonal, to distal RCA, to the ramus intermedius artery, and LIMA the LAD  . 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 Cataract extraction w/ intraocular lens  implant, bilateral Bilateral   . Icd lead removal Left 05/27/2013    Procedure: ICD LEAD REMOVAL;  Surgeon: Evans Lance, MD;  Location: [place];  Service: Cardiovascular;  Laterality: Left;  . Bi-ventricular pacemaker insertion (crt-p) Right 06/17/2013    St Jude  . Pacemaker removal Left 05/2013  . Coronary angioplasty with stent placement  12/17/2001    to VG-diag wth Zeta stent  . Cardiac catheterization  2009    Patent LIMA-LAD; patent VG-diag wth mild in-stent restenosis; patent VG-ramus intemedius;  patent VG-RCA wth 70% mid post lat stenosis  . Cardiac catheterization  03/16/2006    patent LIMA, patent VGs;  EF 30%  . Cardioversion  10/12/2010    successful cardioversion  . Coronary angioplasty  [phone]    LAD;Inf MI PTCA-RCA; ant MI PTCA- LAD   Social History:  reports that he has never smoked. He has never used smokeless tobacco. He reports that he drinks about 8.4 ounces of alcohol per week. He reports that he does not use illicit drugs.  No Known Allergies  Family History  Problem Relation Age of Onset  . Heart disease Father      Prior to Admission medications   Medication Sig Start Date End Date Taking? Authorizing Provider  allopurinol (ZYLOPRIM) 300 MG tablet Take 1 tablet (300 mg total) by mouth daily. 03/11/14  Yes Mihai [name], MD  beta carotene  w/minerals (OCUVITE) tablet Take 1 tablet by mouth daily.   Yes Historical Provider, MD  bimatoprost (LUMIGAN) 0.03 % ophthalmic solution Place 1 drop into both eyes at bedtime.    Yes Historical Provider, MD  budesonide-formoterol (SYMBICORT) 160-4.5 MCG/ACT inhaler Inhale 2 puffs into the lungs 2 (two) times daily.   Yes Historical Provider, MD  captopril (CAPOTEN) 12.5 MG tablet Take 6.25 mg by mouth 2 (two) times daily.   Yes Historical Provider, MD  ciprofloxacin (CIPRO) 250 MG tablet Take 250 mg by mouth 2 (two) times daily. 08/15/14  Yes Historical Provider, MD  digoxin (LANOXIN) 0.125 MG tablet Take 1 tablet (125 mcg total) by mouth at bedtime. 03/13/14  Yes Mihai [name], MD  docusate sodium (COLACE) 100 MG capsule Take 400 mg by mouth at bedtime.   Yes Historical Provider, MD  dorzolamide-timolol (COSOPT) 22.3-6.8 MG/ML ophthalmic solution Place 1 drop into both eyes 2 (two) times daily.  11/25/13  Yes Historical Provider, MD  FIBER PO Take 5 capsules by mouth at bedtime.   Yes Historical Provider, MD  FOLIC ACID PO Take 1 tablet by mouth daily.   Yes Historical Provider, MD  furosemide (LASIX) 80 MG tablet Take 40-80 mg by mouth 2 (two) times daily. 80mg  in am and 40pm in pm   Yes Historical Provider, MD  Iron-Vitamin C (VITRON-C PO) Take 1 tablet by mouth 2 (two) times daily.    Yes Historical Provider, MD  levothyroxine (SYNTHROID, LEVOTHROID) 125 MCG tablet Take 125 mcg by mouth daily before breakfast.   Yes Historical Provider, MD  magnesium oxide (MAG-OX) 400 MG tablet Take 400 mg by mouth daily.   Yes Historical Provider, MD  MAGNESIUM PO Take 1 tablet by mouth every morning.   Yes Historical Provider, MD  metolazone (ZAROXOLYN) 2.5 MG tablet Take 2 times a week or as needed to keep weight down to less than 160 lbs. 04/22/14  Yes Mihai [name], MD  Omega 3-6-9 Fatty Acids (TRIPLE OMEGA-3-6-9) CAPS Take by mouth.   Yes Historical Provider, MD  pantoprazole (PROTONIX) 40 MG tablet Take 1  tablet (40 mg total) by mouth daily. 01/22/14  Yes Luke K Kilroy, PA-C  polyvinyl alcohol (LIQUIFILM TEARS) 1.4 % ophthalmic solution Place 1 drop into both eyes 2 (two) times daily.   Yes Historical Provider, MD  potassium chloride SA (K-DUR,KLOR-CON) 20 MEQ tablet Take 20-40 mEq by mouth 2 (two) times daily. 48meq in am and 20 meq in pm Takes 1 extra tablet when taking metolazone   Yes Historical Provider, MD  warfarin (COUMADIN) 5 MG tablet Take 5 mg by mouth daily at  6 PM. 5mg  on sat only 2.5mg  all other days   Yes Historical Provider, MD   Physical Exam: Filed Vitals:   08/17/14 1430 08/17/14 1537 08/17/14 1809 08/17/14 1826  BP: 130/72 124/67 114/62 114/62  Pulse: 77 78  74  Temp:  98.4 F (36.9 C)    TempSrc:      Resp: 21 18 17 18   Height:      Weight:      SpO2: 100% 99% 98% 98%    Wt Readings from Last 3 Encounters:  08/17/14 71.668 kg (158 lb)  08/17/14 71.668 kg (158 lb)  05/27/14 70.761 kg (156 lb)    General:  Appears calm and comfortable, NAD Eyes: PERRL, normal lids, irises & conjunctiva ENT: grossly normal hearing, lips & tongue Neck: no LAD, masses or thyromegaly Cardiovascular: RRR, +murmur, + LE edema. Telemetry: SR, no arrhythmias  Respiratory: CTA bilaterally, no w/r/r. Normal respiratory effort. Abdomen: soft, ntnd Skin: non blanching petechiae and purpura on b/l feet/ankles, up to midshin.  Spares the soles.  Musculoskeletal: grossly normal tone BUE/BLE Psychiatric: grossly normal mood and affect, speech fluent and appropriate Neurologic: grossly non-focal.          Labs on Admission:  Basic Metabolic Panel:  Recent Labs Lab 08/14/14 1206 08/17/14 1533 08/17/14 1553  NA 132* 130* 129*  K 4.8 3.3* 3.1*  CL 92* 87* 90*  CO2 30 28  --   GLUCOSE 167* 114* 113*  BUN 47* 35* 32*  CREATININE 1.40* 1.17 1.20  CALCIUM 9.4 8.6  --    Liver Function Tests:  Recent Labs Lab 08/14/14 1206 08/17/14 1533  AST 25 26  ALT 15 16  ALKPHOS 94 111    BILITOT 1.2 1.4*  PROT 7.1 7.4  ALBUMIN 4.5 3.4*   No results found for this basename: LIPASE, AMYLASE,  in the last 168 hours No results found for this basename: AMMONIA,  in the last 168 hours CBC:  Recent Labs Lab 08/14/14 1100 08/17/14 1533 08/17/14 1553  WBC 7.1 6.2  --   NEUTROABS  --  5.3  --   HGB 11.9* 11.9* 13.3  HCT 35.6* 34.8* 39.0  MCV 94.2 92.3  --   PLT 106* 129*  --    Cardiac Enzymes: No results found for this basename: CKTOTAL, CKMB, CKMBINDEX, TROPONINI,  in the last 168 hours  BNP (last 3 results)  Recent Labs  12/31/13 0418 01/22/14 1121  PROBNP 1900.0* 3151.00*   CBG: No results found for this basename: GLUCAP,  in the last 168 hours  Radiological Exams on Admission: Dg Chest Port 1 View  08/17/2014   CLINICAL DATA:  Initial encounter.  EXAM: PORTABLE CHEST - 1 VIEW  COMPARISON:  01/08/2014  FINDINGS: Pacer with leads at right ventricle. Midline trachea. Moderate cardiomegaly. Prior median sternotomy. No pleural effusion or pneumothorax. No congestive failure. No lobar consolidation. Bilateral calcified pleural plaques.  IMPRESSION: Cardiomegaly without congestive failure.  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   Electronically Signed   By: Abigail Miyamoto M.D.   On: 08/17/2014 15:59    EKG: Independently reviewed. Ventricular paced  Assessment/Plan Active Problems:   Atrial fibrillation, permanent   Hypokalemia   Generalized weakness   Hyponatremia   Rash and nonspecific skin eruption   Non blanching petichea/purpura- ? Etiology of medications- recently started on cipro, given extra doses of metolazone- picture of rash in urgent care note for comparison, doubt HUS as no AKI -peripheral smear ?  vasculitis: drug induced? ?Liver function tests ok ?Blood urea nitrogen (BUN)/creatinine - stable ?Urinalysis with microscopy - pending -may need biopsy or further studies (ANCA, etc) -watch of signs of  infection  CKD- Cr stable  Atrial fib- has pacer, replaced 1 year ago after infection  Chronic CHF- continue lasix, chest x ray clear  Hyponatremia- hold metalozone- patient appears euvolemic- trend, may need to fluid restrict  Hypokalemia- replete and monitor  Weakness- ? Etiology, no sign of infection, no growth on culture for UTI- PT consult- ? Age related    Code Status: full DVT Prophylaxis: Family Communication: daughter at bedside Disposition Plan:   Time spent: 26 min  Eulogio Bear [facility] Pager [phone]

## 2014-08-17 NOTE — ED Notes (Signed)
Turkey sandwich given 

## 2014-08-17 NOTE — ED Provider Notes (Signed)
CSN: [id number]     Arrival date & time 08/17/14  1344 History   First MD Initiated Contact with Patient 08/17/14 1511     Chief Complaint  Patient presents with  . Rash  . Weakness     (Consider location/radiation/quality/duration/timing/severity/associated sxs/prior Treatment) HPI Comments: Pt is an 80 y/o male with hx of afib, pacer, ischemic cardiomyopathy and CHF - presents with rash to his bialteral legs that started last night - the patient denies any significant change in other symptoms other than feeling generally weak. He has had normal appetite, no dysuria fevers chills coughing and really doesn't complain of shortness of breath at this time. He recently had increasing dose of his metazolone, is currently taking Lasix.  The rash is persistent, seemed to gradually be worsening, nothing makes this better or worse, the patient is on Coumadin  Patient is a 80 y.o. male presenting with rash and weakness. The history is provided by the patient.  Rash Weakness    Past Medical History  Diagnosis Date  . Ischemic cardiomyopathy     EF-35-45%by echo in 2011; first dx in the 90's but again in 2009 & rec'd 1st BiV ICD   . Paroxysmal atrial fibrillation     sinus rhythm on amiodarone  . Asbestos exposure     plaques on CT  . Compression fracture     T7  . Hypertension   . Coronary artery disease     last cath 2009, hx CABG  . Automatic implantable cardioverter-defibrillator in situ     now explanted  . Hypothyroidism   . CHF (congestive heart failure)     hx.  . Myocardial infarction     INFERIOR [phone] wth PTCA; Ant MI 1995 wth PTCA   . Shortness of breath     "only when external pacemaker wasn't working right" (06/17/2013)  . Arthritis     "a touch all over" (06/17/2013)  . COPD (chronic obstructive pulmonary disease)     "a touch" (8/4//2014)  . Atrial fibrillation, permanent 07/22/2013  . Biventricular cardiac pacemaker in situ 06/2013    St. Jude Allura (pt no longer  wished for ICD portion)  . Pacemaker    Past Surgical History  Procedure Laterality Date  . Inguinal hernia repair Right   . Coronary artery bypass graft  11/01/1994    SVG to first diagonal, to distal RCA, to the ramus intermedius artery, and LIMA the LAD  . 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 Cataract extraction w/ intraocular lens  implant, bilateral Bilateral   . Icd lead removal Left 05/27/2013    Procedure: ICD LEAD REMOVAL;  Surgeon: Evans Lance, MD;  Location: [place];  Service: Cardiovascular;  Laterality: Left;  . Bi-ventricular pacemaker insertion (crt-p) Right 06/17/2013    St Jude  . Pacemaker removal Left 05/2013  . Coronary angioplasty with stent placement  12/17/2001    to VG-diag wth Zeta stent  . Cardiac catheterization  2009    Patent LIMA-LAD; patent VG-diag wth mild in-stent restenosis; patent VG-ramus intemedius; patent VG-RCA wth 70% mid post lat stenosis  . Cardiac catheterization  03/16/2006    patent LIMA, patent VGs;  EF 30%  . Cardioversion  10/12/2010    successful cardioversion  . Coronary  angioplasty  [phone]    LAD;Inf MI PTCA-RCA; ant MI PTCA- LAD   Family History  Problem Relation Age of Onset  . Heart disease Father    History  Substance Use Topics  . Smoking status: Never Smoker   . Smokeless tobacco: Never Used  . Alcohol Use: 8.4 oz/week    14 Glasses of wine per week     Comment: 01/08/14- 4 glasses of wine qwk    Review of Systems  Skin: Positive for rash.  Neurological: Positive for weakness.  All other systems reviewed and are negative.     Allergies  Review of patient's allergies indicates no known allergies.  Home Medications   Prior to Admission medications   Medication Sig Start Date End Date Taking? Authorizing  Provider  allopurinol (ZYLOPRIM) 300 MG tablet Take 1 tablet (300 mg total) by mouth daily. 03/11/14  Yes Mihai [name], MD  beta carotene w/minerals (OCUVITE) tablet Take 1 tablet by mouth daily.   Yes Historical Provider, MD  bimatoprost (LUMIGAN) 0.03 % ophthalmic solution Place 1 drop into both eyes at bedtime.    Yes Historical Provider, MD  budesonide-formoterol (SYMBICORT) 160-4.5 MCG/ACT inhaler Inhale 2 puffs into the lungs 2 (two) times daily.   Yes Historical Provider, MD  captopril (CAPOTEN) 12.5 MG tablet Take 6.25 mg by mouth 2 (two) times daily.   Yes Historical Provider, MD  ciprofloxacin (CIPRO) 250 MG tablet Take 250 mg by mouth 2 (two) times daily. 08/15/14  Yes Historical Provider, MD  digoxin (LANOXIN) 0.125 MG tablet Take 1 tablet (125 mcg total) by mouth at bedtime. 03/13/14  Yes Mihai [name], MD  docusate sodium (COLACE) 100 MG capsule Take 400 mg by mouth at bedtime.   Yes Historical Provider, MD  dorzolamide-timolol (COSOPT) 22.3-6.8 MG/ML ophthalmic solution Place 1 drop into both eyes 2 (two) times daily.  11/25/13  Yes Historical Provider, MD  FIBER PO Take 5 capsules by mouth at bedtime.   Yes Historical Provider, MD  FOLIC ACID PO Take 1 tablet by mouth daily.   Yes Historical Provider, MD  furosemide (LASIX) 80 MG tablet Take 40-80 mg by mouth 2 (two) times daily. 80mg  in am and 40pm in pm   Yes Historical Provider, MD  Iron-Vitamin C (VITRON-C PO) Take 1 tablet by mouth 2 (two) times daily.    Yes Historical Provider, MD  levothyroxine (SYNTHROID, LEVOTHROID) 125 MCG tablet Take 125 mcg by mouth daily before breakfast.   Yes Historical Provider, MD  magnesium oxide (MAG-OX) 400 MG tablet Take 400 mg by mouth daily.   Yes Historical Provider, MD  MAGNESIUM PO Take 1 tablet by mouth every morning.   Yes Historical Provider, MD  metolazone (ZAROXOLYN) 2.5 MG tablet Take 2 times a week or as needed to keep weight down to less than 160 lbs. 04/22/14  Yes Mihai [name], MD   Omega 3-6-9 Fatty Acids (TRIPLE OMEGA-3-6-9) CAPS Take by mouth.   Yes Historical Provider, MD  pantoprazole (PROTONIX) 40 MG tablet Take 1 tablet (40 mg total) by mouth daily. 01/22/14  Yes Luke K Kilroy, PA-C  polyvinyl alcohol (LIQUIFILM TEARS) 1.4 % ophthalmic solution Place 1 drop into both eyes 2 (two) times daily.   Yes Historical Provider, MD  potassium chloride SA (K-DUR,KLOR-CON) 20 MEQ tablet Take 20-40 mEq by mouth 2 (two) times daily. 29meq in am and 20 meq in pm Takes 1 extra tablet when taking metolazone   Yes Historical Provider, MD  warfarin (COUMADIN) 5 MG tablet  Take 5 mg by mouth daily at 6 PM. 5mg  on sat only 2.5mg  all other days   Yes Historical Provider, MD   BP 124/67  Pulse 78  Temp(Src) 98.4 F (36.9 C) (Oral)  Resp 18  Ht 5\' 11"  (1.803 m)  Wt 158 lb (71.668 kg)  BMI 22.05 kg/m2  SpO2 99% Physical Exam  Nursing note and vitals reviewed. Constitutional: He appears well-developed and well-nourished. No distress.  HENT:  Head: Normocephalic and atraumatic.  Mouth/Throat: Oropharynx is clear and moist. No oropharyngeal exudate.  Eyes: Conjunctivae and EOM are normal. Pupils are equal, round, and reactive to light. Right eye exhibits no discharge. Left eye exhibits no discharge. No scleral icterus.  Neck: Normal range of motion. Neck supple. No JVD present. No thyromegaly present.  Cardiovascular: Normal rate, regular rhythm and intact distal pulses.  Exam reveals no gallop and no friction rub.   Murmur (systolic) heard. Pulmonary/Chest: Effort normal and breath sounds normal. No respiratory distress. He has no wheezes. He has no rales.  Abdominal: Soft. Bowel sounds are normal. He exhibits no distension and no mass. There is no tenderness.  Musculoskeletal: Normal range of motion. He exhibits edema (1+ bilateral symmetrical pitting to the lower extremities). He exhibits no tenderness.  Lymphadenopathy:    He has no cervical adenopathy.  Neurological: He is  alert. Coordination normal.  Skin: Skin is warm and dry. No rash noted. No erythema.  Petechiae and purpura or rash to the bilateral lower extremities from the knees down surrounding the ankles especially, tender  Psychiatric: He has a normal mood and affect. His behavior is normal.    ED Course  Procedures (including critical care time) Labs Review Labs Reviewed  CBC WITH DIFFERENTIAL - Abnormal; Notable for the following:    RBC 3.77 (*)    Hemoglobin 11.9 (*)    HCT 34.8 (*)    Platelets 129 (*)    Neutrophils Relative % 87 (*)    Lymphocytes Relative 6 (*)    Lymphs Abs 0.4 (*)    All other components within normal limits  COMPREHENSIVE METABOLIC PANEL - Abnormal; Notable for the following:    Sodium 130 (*)    Potassium 3.3 (*)    Chloride 87 (*)    Glucose, Bld 114 (*)    BUN 35 (*)    Albumin 3.4 (*)    Total Bilirubin 1.4 (*)    GFR calc non Af Amer 55 (*)    GFR calc Af Amer 63 (*)    All other components within normal limits  PROTIME-INR - Abnormal; Notable for the following:    Prothrombin Time 25.1 (*)    INR 2.28 (*)    All other components within normal limits  I-STAT CHEM 8, ED - Abnormal; Notable for the following:    Sodium 129 (*)    Potassium 3.1 (*)    Chloride 90 (*)    BUN 32 (*)    Glucose, Bld 113 (*)    Calcium, Ion 1.03 (*)    All other components within normal limits  CULTURE, BLOOD (ROUTINE X 2)  CULTURE, BLOOD (ROUTINE X 2)  DIGOXIN LEVEL  I-STAT TROPOININ, ED    Imaging Review Dg Chest Port 1 View  08/17/2014   CLINICAL DATA:  Initial encounter.  EXAM: PORTABLE CHEST - 1 VIEW  COMPARISON:  01/08/2014  FINDINGS: Pacer with leads at right ventricle. Midline trachea. Moderate cardiomegaly. Prior median sternotomy. No pleural effusion or pneumothorax. No congestive failure.  No lobar consolidation. Bilateral calcified pleural plaques.  IMPRESSION: Cardiomegaly without congestive failure.  Calcified pleural plaques, most consistent with  asbestos related pleural disease.   Electronically Signed   By: Abigail Miyamoto M.D.   On: 08/17/2014 15:59     EKG Interpretation   Date/Time:  Sunday August 17 2014 16:05:00 EDT Ventricular Rate:  70 PR Interval:    QRS Duration: 191 QT Interval:  508 QTC Calculation: 548 R Axis:   -103 Text Interpretation:  VENTRICULAR PACED RHYTHM Since last tracing rate  slower Abnormal ekg Confirmed by Sabra Heck  MD, [place] ([id number]) on 08/17/2014  4:33:32 PM      MDM   Final diagnoses:  Generalized weakness  Purpura  Petechiae    The patient has a tender palpable petechia and purpura or rash, consider medication related, significant infection though the patient is afebrile and has no leukocytosis as measured by urgent care labs prior to arrival.  Platelets slightly low, no significant changes, vital signs without hypertension or fever, and no leukocytosis, etiology of the rash is uncertain, discussed with hospitalist who will admit    Johnna Acosta, MD 08/17/14 1800

## 2014-08-17 NOTE — ED Provider Notes (Signed)
[name] is a 80 y.o. male who presents to Urgent Care today for rash. Patient has deteriorated over the past week or so with increasing fatigue vision changes and wheezing. Yesterday he developed ankle swelling associated with a red rash. He has multiple medical comorbidities. His most significant is ischemic cardiomyopathy and congestive heart failure. He's been dosing Lasix in the [place] and based on weight and currently is at his dry weight. Additionally he's been evaluated for a urinary tract infection over the last several days and started taking ciprofloxacin yesterday.  He presents to urgent care today for evaluation of his bilateral ankle rash. No itching or fever.   Past Medical History  Diagnosis Date  . Ischemic cardiomyopathy     EF-35-45%by echo in 2011; first dx in the 90's but again in 2009 & rec'd 1st BiV ICD   . Paroxysmal atrial fibrillation     sinus rhythm on amiodarone  . Asbestos exposure     plaques on CT  . Compression fracture     T7  . Hypertension   . Coronary artery disease     last cath 2009, hx CABG  . Automatic implantable cardioverter-defibrillator in situ     now explanted  . Hypothyroidism   . CHF (congestive heart failure)     hx.  . Myocardial infarction     INFERIOR [phone] wth PTCA; Ant MI 1995 wth PTCA   . Shortness of breath     "only when external pacemaker wasn't working right" (06/17/2013)  . Arthritis     "a touch all over" (06/17/2013)  . COPD (chronic obstructive pulmonary disease)     "a touch" (8/4//2014)  . Atrial fibrillation, permanent 07/22/2013  . Biventricular cardiac pacemaker in situ 06/2013    St. Jude Allura (pt no longer wished for ICD portion)  . Pacemaker    History  Substance Use Topics  . Smoking status: Never Smoker   . Smokeless tobacco: Never Used  . Alcohol Use: 8.4 oz/week    14 Glasses of wine per week     Comment: 01/08/14- 4 glasses of wine qwk   ROS as above Medications: No current facility-administered  medications for this encounter.   Current Outpatient Prescriptions  Medication Sig Dispense Refill  . albuterol (PROVENTIL HFA;VENTOLIN HFA) 108 (90 BASE) MCG/ACT inhaler Inhale 2 puffs into the lungs every 6 (six) hours as needed for wheezing or shortness of breath.      . allopurinol (ZYLOPRIM) 300 MG tablet Take 1 tablet (300 mg total) by mouth daily.  30 tablet  6  . beta carotene w/minerals (OCUVITE) tablet Take 1 tablet by mouth daily.      . bimatoprost (LUMIGAN) 0.03 % ophthalmic solution Place 1 drop into both eyes at bedtime.       . captopril (CAPOTEN) 12.5 MG tablet Take 1/2 tablet twice a day.  30 tablet  6  . colchicine 0.6 MG tablet Take 1 tablet (0.6 mg total) by mouth daily. Only take 5-6 days for each episode.  30 tablet  5  . digoxin (LANOXIN) 0.125 MG tablet Take 1 tablet (125 mcg total) by mouth at bedtime.  90 tablet  3  . dorzolamide-timolol (COSOPT) 22.3-6.8 MG/ML ophthalmic solution Place 1 drop into both eyes 2 (two) times daily.       . furosemide (LASIX) 80 MG tablet Take one tablet (80mg ) in the AM and 1/2 tablet (40mg ) in the afternoon.  135 tablet  3  . Iron-Vitamin C (  VITRON-C PO) Take by mouth daily.      Marland Kitchen levothyroxine (SYNTHROID, LEVOTHROID) 125 MCG tablet Take 125 mcg by mouth daily before breakfast.      . magnesium oxide (MAG-OX) 400 MG tablet Take 400 mg by mouth daily.      . metolazone (ZAROXOLYN) 2.5 MG tablet Take 2 times a week or as needed to keep weight down to less than 160 lbs.  30 tablet  3  . pantoprazole (PROTONIX) 40 MG tablet Take 1 tablet (40 mg total) by mouth daily.  30 tablet  11  . potassium chloride (K-DUR,KLOR-CON) 10 MEQ tablet Take 4 tablets (40 mEq total) by mouth daily.  120 tablet  5  . SYMBICORT 160-4.5 MCG/ACT inhaler INHALE 2 PUFFS 2 TIMES DAILY  10.2 g  3  . warfarin (COUMADIN) 5 MG tablet take 1 tab m, wed, frid      . warfarin (COUMADIN) 5 MG tablet TAKE 1 TABLET BY MOUTH DAILY AS DIRECTED  90 tablet  1    Exam:  BP  118/70  Pulse 76  Temp(Src) 98.5 F (36.9 C) (Oral)  Ht 5\' 11"  (1.803 m)  Wt 158 lb (71.668 kg)  BMI 22.05 kg/m2  SpO2 93% Gen: Well NAD for ill-appearing HEENT: EOMI,  MMM Lungs: Normal work of breathing. CTABL Heart: RRR no MRG Abd: NABS, Soft. Nondistended, Nontender Exts: 1+ edema bilateral lower extremities. Multiple petechiae are present with area of palpable purpura are also present bilateral ankles.      Lab Results  Component Value Date   WBC 7.1 08/14/2014   HGB 11.9* 08/14/2014   HCT 35.6* 08/14/2014   MCV 94.2 08/14/2014   PLT 106* 08/14/2014   Lab Results  Component Value Date   CREATININE 1.40* 08/14/2014     Assessment and Plan: 80 y.o. male with  Fatigue vision changes and bilateral palpable purpura. Patient had labs drawn on October 1 which showed decreasing platelets at 106 and increasing creatinine at 1.4. I am concerned patient may have HUS or other series etiology. Plan to transfer to [facility] hospital for further evaluation and management. He is stable. His daughter while driving to [facility] emergency room.  Discussed warning signs or symptoms. Please see discharge instructions. Patient expresses understanding.     Gregor Hams, MD 08/17/14 [phone]

## 2014-08-17 NOTE — ED Notes (Signed)
Admitting doctor at the bedside 

## 2014-08-17 NOTE — ED Notes (Signed)
Per pt family having increasing weakness and rash to BLE that started yesterday. sts was given fluid pill yesterday and it started after that. sts getting worse quick.

## 2014-08-17 NOTE — ED Notes (Signed)
Pt states intermittent generalized weakness over the past several weeks. Also reports swelling and red discolaration to bilateral lower extremities. Respirations unlabored. Denies chest pain. Pt is alert and oriented x4.

## 2014-08-17 NOTE — ED Notes (Signed)
Pt daughter says pt has had wheezing, extreme fatigue, eye sight changes this past week and now his ankles and feet are red and purple.  Pain is 5/10.

## 2014-08-17 NOTE — ED Notes (Signed)
The pt  Has no complaints except a little cool. Blanket given

## 2014-08-18 DIAGNOSIS — R21 Rash and other nonspecific skin eruption: Secondary | ICD-10-CM

## 2014-08-18 DIAGNOSIS — R233 Spontaneous ecchymoses: Secondary | ICD-10-CM

## 2014-08-18 DIAGNOSIS — I482 Chronic atrial fibrillation: Secondary | ICD-10-CM

## 2014-08-18 DIAGNOSIS — E876 Hypokalemia: Secondary | ICD-10-CM

## 2014-08-18 DIAGNOSIS — E871 Hypo-osmolality and hyponatremia: Secondary | ICD-10-CM

## 2014-08-18 DIAGNOSIS — R531 Weakness: Secondary | ICD-10-CM

## 2014-08-18 DIAGNOSIS — I5042 Chronic combined systolic (congestive) and diastolic (congestive) heart failure: Secondary | ICD-10-CM

## 2014-08-18 LAB — BASIC METABOLIC PANEL
ANION GAP: 13 (ref 5–15)
BUN: 33 mg/dL — AB (ref 6–23)
CALCIUM: 8.6 mg/dL (ref 8.4–10.5)
CO2: 28 mEq/L (ref 19–32)
CREATININE: 1.16 mg/dL (ref 0.50–1.35)
Chloride: 93 mEq/L — ABNORMAL LOW (ref 96–112)
GFR calc non Af Amer: 55 mL/min — ABNORMAL LOW (ref >90)
GFR, EST AFRICAN AMERICAN: 64 mL/min — AB (ref >90)
Glucose, Bld: 109 mg/dL — ABNORMAL HIGH (ref 70–99)
Potassium: 3.6 mEq/L — ABNORMAL LOW (ref 3.7–5.3)
Sodium: 134 mEq/L — ABNORMAL LOW (ref 137–147)

## 2014-08-18 LAB — CBC
HCT: 34.1 % — ABNORMAL LOW (ref 39.0–52.0)
Hemoglobin: 11.4 g/dL — ABNORMAL LOW (ref 13.0–17.0)
MCH: 31.1 pg (ref 26.0–34.0)
MCHC: 33.4 g/dL (ref 30.0–36.0)
MCV: 92.9 fL (ref 78.0–100.0)
Platelets: 134 10*3/uL — ABNORMAL LOW (ref 150–400)
RBC: 3.67 MIL/uL — ABNORMAL LOW (ref 4.22–5.81)
RDW: 15.1 % (ref 11.5–15.5)
WBC: 5.9 10*3/uL (ref 4.0–10.5)

## 2014-08-18 LAB — C-REACTIVE PROTEIN: CRP: 10.3 mg/dL — AB (ref <0.60)

## 2014-08-18 MED ORDER — SODIUM CHLORIDE 0.9 % IV SOLN
INTRAVENOUS | Status: DC
  Administered 2014-08-18 – 2014-08-19 (×2): via INTRAVENOUS

## 2014-08-18 NOTE — Progress Notes (Signed)
UR completed 

## 2014-08-18 NOTE — Evaluation (Signed)
Physical Therapy Evaluation Patient Details Name: [name] MRN: [id number] DOB: 06-Aug-1926 Today's Date: 08/18/2014   History of Present Illness  [name] is a 80 y.o. male Who was sent over from the urgent care. He was seen in the last few days by his cardiologist for weakness, LE edema, and vision changes.  He was started on cipro Friday night. Saturday, his daughter noticed a red rash appearing on his feet and his ankles and reported wheezing PTA.His platelets are about at baseline 130s, his Cr is at baseline, his Na and his K are low. His INR was therapeutic. His legs are only painful if he walks.  Clinical Impression  Pt admitted with gait instability. Pt currently with functional limitations due to the deficits listed below (see PT Problem List). Pt normally ambulates with no AD and is currently using RW for steadying. Pt will benefit from skilled PT to increase their independence and safety with mobility to allow discharge to the venue listed below. PT will continue to follow.       Follow Up Recommendations Outpatient PT for balance    Equipment Recommendations  None recommended by PT    Recommendations for Other Services       Precautions / Restrictions Precautions Precautions: Fall Restrictions Weight Bearing Restrictions: No      Mobility  Bed Mobility Overal bed mobility: Modified Independent                Transfers Overall transfer level: Needs assistance Equipment used: Rolling walker (2 wheeled) Transfers: Sit to/from Stand Sit to Stand: Supervision         General transfer comment: supervision due to pt feeling slightly light headed with sitting and intial standing. No LOB  Ambulation/Gait Ambulation/Gait assistance: Min guard Ambulation Distance (Feet): 150 Feet Assistive device: Rolling walker (2 wheeled) Gait Pattern/deviations: Step-through pattern;Trunk flexed Gait velocity: decreased   General Gait Details: vc's for erect  posture. Pt ambulated with RW and did not want to ambulate without it until last 10'. Reports that foot pain is less today than yesterday when ambulating but still feels unsteady compared to his normal. No LOB with RW and no LOB last 10' without it.  Stairs            Wheelchair Mobility    Modified Rankin (Stroke Patients Only)       Balance Overall balance assessment: Needs assistance Sitting-balance support: No upper extremity supported;Feet unsupported Sitting balance-Leahy Scale: Good     Standing balance support: No upper extremity supported;During functional activity Standing balance-Leahy Scale: Good Standing balance comment: pt maintains static standing well without UE support but feels insecure with dynamic activity, this is not his normal                             Pertinent Vitals/Pain Pain Assessment: Faces Faces Pain Scale: Hurts little more Pain Location: feet, improved from yesterday Pain Descriptors / Indicators: Aching Pain Intervention(s): Monitored during session;Limited activity within patient's tolerance    Home Living Family/patient expects to be discharged to:: Private residence Living Arrangements: Alone Available Help at Discharge: Friend(s);Available PRN/intermittently;Family Type of Home: House Home Access: Stairs to enter Entrance Stairs-Rails: Right Entrance Stairs-Number of Steps: 3 Home Layout: Two level;Able to live on main level with bedroom/bathroom Home Equipment: Walker - 2 wheels Additional Comments: Pt has very supportive family; daughter bring pt food and check on him daily    Prior Function  Level of Independence: Independent         Comments: pt drives      Hand Dominance   Dominant Hand: Right    Extremity/Trunk Assessment   Upper Extremity Assessment: Overall WFL for tasks assessed           Lower Extremity Assessment: RLE deficits/detail;LLE deficits/detail RLE Deficits / Details: rash noted  feet and over halfway up anterior tibia, mild swelling, tender to touch and warm. Strength WFL at ankle, knee and hip LLE Deficits / Details: same as RLE though rash slightly less extensive  Cervical / Trunk Assessment: Kyphotic  Communication   Communication: No difficulties  Cognition Arousal/Alertness: Awake/alert Behavior During Therapy: Flat affect Overall Cognitive Status: Within Functional Limits for tasks assessed                      General Comments General comments (skin integrity, edema, etc.): pt feels more sleepy than his normal per his report, denies chills or nausea    Exercises General Exercises - Lower Extremity Ankle Circles/Pumps: AROM;Both;10 reps;Supine      Assessment/Plan    PT Assessment Patient needs continued PT services  PT Diagnosis Abnormality of gait   PT Problem List Decreased balance;Decreased mobility;Decreased knowledge of use of DME;Impaired sensation  PT Treatment Interventions DME instruction;Gait training;Stair training;Functional mobility training;Therapeutic activities;Therapeutic exercise;Balance training;Patient/family education   PT Goals (Current goals can be found in the Care Plan section) Acute Rehab PT Goals Patient Stated Goal: figure out what's wrong with his feet PT Goal Formulation: With patient Time For Goal Achievement: 09/01/14 Potential to Achieve Goals: Good    Frequency Min 3X/week   Barriers to discharge        Co-evaluation               End of Session Equipment Utilized During Treatment: Gait belt Activity Tolerance: Patient tolerated treatment well Patient left: in bed;with call bell/phone within reach;with family/visitor present Nurse Communication: Mobility status    Functional Assessment Tool Used: clinical judgement Functional Limitation: Mobility: Walking and moving around Mobility: Walking and Moving Around Current Status [phone]): At least 1 percent but less than 20 percent impaired,  limited or restricted Mobility: Walking and Moving Around Goal Status [phone]): 0 percent impaired, limited or restricted    Time: 8469-6295 PT Time Calculation (min): 32 min   Charges:   PT Evaluation $Initial PT Evaluation Tier I: 1 Procedure PT Treatments $Gait Training: 8-22 mins $Therapeutic Activity: 8-22 mins   PT G Codes:   Functional Assessment Tool Used: clinical judgement Functional Limitation: Mobility: Walking and moving around   [place], [place]  Manus Rudd, [place] 08/18/2014, 1:51 PM

## 2014-08-18 NOTE — Progress Notes (Signed)
ANTICOAGULATION CONSULT NOTE -Follow up Pharmacy Consult for Coumadin Indication: atrial fibrillation  No Known Allergies  Patient Measurements: Height: 5\' 11"  (180.3 cm) Weight: 151 lb 6.4 oz (68.675 kg) (Scale B) IBW/kg (Calculated) : 75.3  Vital Signs: Temp: 98.4 F (36.9 C) (10/05 0511) Temp Source: Oral (10/05 0511) BP: 106/64 mmHg (10/05 0511) Pulse Rate: 97 (10/05 0511)  Labs:  Recent Labs  08/17/14 1533 08/17/14 1553 08/18/14 0305  HGB 11.9* 13.3 11.4*  HCT 34.8* 39.0 34.1*  PLT 129*  --  134*  LABPROT 25.1*  --   --   INR 2.28*  --   --   CREATININE 1.17 1.20 1.16    Estimated Creatinine Clearance: 44.4 ml/min (by C-G formula based on Cr of 1.16).   Medical History: Past Medical History  Diagnosis Date  . Ischemic cardiomyopathy     EF-35-45%by echo in 2011; first dx in the 90's but again in 2009 & rec'd 1st BiV ICD   . Paroxysmal atrial fibrillation     sinus rhythm on amiodarone  . Asbestos exposure     plaques on CT  . Compression fracture     T7  . Hypertension   . Coronary artery disease     last cath 2009, hx CABG  . Automatic implantable cardioverter-defibrillator in situ     now explanted  . Hypothyroidism   . CHF (congestive heart failure)     hx.  . Myocardial infarction     INFERIOR [phone] wth PTCA; Ant MI 1995 wth PTCA   . Shortness of breath     "only when external pacemaker wasn't working right" (06/17/2013)  . Arthritis     "a touch all over" (06/17/2013)  . COPD (chronic obstructive pulmonary disease)     "a touch" (8/4//2014)  . Atrial fibrillation, permanent 07/22/2013  . Biventricular cardiac pacemaker in situ 06/2013    St. Jude Allura (pt no longer wished for ICD portion)  . Pacemaker     Assessment: 80 year old male on Coumadin PTA for PAF.   INR was therapeutic on admission.  Admitted for rash on lower leg Dose PTA = 5 mg on Saturdays, 2.5 mg other days Last PTA dose reportedly taken 10/3 and we gave 2.5mg  last night.   CBC this AM H/H 11.4/34.1 and PLTC 134 up from 129K.  No bleeding reported.  Patient ate 100 % of breakfast this AM.   Goal of Therapy:  INR 2-3 Monitor platelets by anticoagulation protocol: Yes   Plan:  1) Continue home dose 2.5mg  daily except 5 mg qSat.  2) Follow daily INR  Thank you. Nicole Cella, RPh Clinical Pharmacist Pager: [phone] 08/18/2014,10:15 AM

## 2014-08-18 NOTE — Progress Notes (Signed)
[facility] PROGRESS NOTE   Assessment/Plan: Rash and nonspecific skin eruption - peripheral smear pending. No blanchable. - ESR and CRP elevated. Has chronic thrombocytopenia. - ? If due to cipro. - has remained afebrile, no leukocytosis and plt's stable.  Generalized weakness - TSh 2.2, check a cortisol  Hyponatremia/ Hypokalemia - due to decrease intravascular vol.  Atrial fibrillation, permanent - rate controlled.   Chronic thrombocytopenia: - stable have been ranging from 90'-130's.  Code Status: full Family Communication: daughter  Disposition Plan: inpatient   Consultants:  ID  Procedures:  CXR  Antibiotics:  None  HPI/Subjective: Leg pain with walking  Objective: Filed Vitals:   08/17/14 1937 08/17/14 2000 08/18/14 0228 08/18/14 0511  BP: 127/66 128/68 114/63 106/64  Pulse: 80 82 73 97  Temp: 97.9 F (36.6 C) 97.8 F (36.6 C) 98.8 F (37.1 C) 98.4 F (36.9 C)  TempSrc: Oral Oral Oral Oral  Resp: 18 20 20 18   Height: 5' 11"  (1.803 m)     Weight: 69.536 kg (153 lb 4.8 oz)   68.675 kg (151 lb 6.4 oz)  SpO2: 97% 98% 98% 97%    Intake/Output Summary (Last 24 hours) at 08/18/14 0948 Last data filed at 08/18/14 0900  Gross per 24 hour  Intake    240 ml  Output   2925 ml  Net  -2685 ml   Filed Weights   08/17/14 1401 08/17/14 1937 08/18/14 0511  Weight: 71.668 kg (158 lb) 69.536 kg (153 lb 4.8 oz) 68.675 kg (151 lb 6.4 oz)    Exam:  General: Alert, awake, oriented x3, in no acute distress.  HEENT: No bruits, no goiter.  Heart: Regular rate and rhythm. Lungs: Good air movement, clear Abdomen: Soft, nontender, nondistended, positive bowel sounds.    Data Reviewed: Basic Metabolic Panel:  Recent Labs Lab 08/14/14 1206 08/17/14 1533 08/17/14 1553 08/18/14 0305  NA 132* 130* 129* 134*  K 4.8 3.3* 3.1* 3.6*  CL 92* 87* 90* 93*  CO2 30 28  --  28  GLUCOSE 167* 114* 113* 109*  BUN 47* 35* 32* 33*  CREATININE 1.40* 1.17  1.20 1.16  CALCIUM 9.4 8.6  --  8.6   Liver Function Tests:  Recent Labs Lab 08/14/14 1206 08/17/14 1533  AST 25 26  ALT 15 16  ALKPHOS 94 111  BILITOT 1.2 1.4*  PROT 7.1 7.4  ALBUMIN 4.5 3.4*   No results found for this basename: LIPASE, AMYLASE,  in the last 168 hours No results found for this basename: AMMONIA,  in the last 168 hours CBC:  Recent Labs Lab 08/14/14 1100 08/17/14 1533 08/17/14 1553 08/18/14 0305  WBC 7.1 6.2  --  5.9  NEUTROABS  --  5.3  --   --   HGB 11.9* 11.9* 13.3 11.4*  HCT 35.6* 34.8* 39.0 34.1*  MCV 94.2 92.3  --  92.9  PLT 106* 129*  --  134*   Cardiac Enzymes: No results found for this basename: CKTOTAL, CKMB, CKMBINDEX, TROPONINI,  in the last 168 hours BNP (last 3 results)  Recent Labs  12/31/13 0418 01/22/14 1121  PROBNP 1900.0* 3151.00*   CBG: No results found for this basename: GLUCAP,  in the last 168 hours  Recent Results (from the past 240 hour(s))  URINE CULTURE     Status: None   Collection Time    08/14/14 11:00 AM      Result Value Ref Range Status   Colony Count 50,000 COLONIES/ML  Final   Organism ID, Bacteria Multiple bacterial morphotypes present, none   Final   Organism ID, Bacteria predominant. Suggest appropriate recollection if    Final   Organism ID, Bacteria clinically indicated.   Final  CULTURE, BLOOD (ROUTINE X 2)     Status: None   Collection Time    08/17/14  4:48 PM      Result Value Ref Range Status   Specimen Description BLOOD RIGHT ARM   Final   Special Requests BOTTLES DRAWN AEROBIC AND ANAEROBIC 10CC EACH   Final   Culture  Setup Time     Final   Value: 08/17/2014 22:18     Performed at [facility]   Culture     Final   Value:        BLOOD CULTURE RECEIVED NO GROWTH TO DATE CULTURE WILL BE HELD FOR 5 DAYS BEFORE ISSUING A FINAL NEGATIVE REPORT     Performed at [facility]   Report Status PENDING   Incomplete  CULTURE, BLOOD (ROUTINE X 2)     Status: None   Collection  Time    08/17/14  4:56 PM      Result Value Ref Range Status   Specimen Description BLOOD LEFT ARM   Final   Special Requests BOTTLES DRAWN AEROBIC ONLY 10CC   Final   Culture  Setup Time     Final   Value: 08/17/2014 22:18     Performed at [facility]   Culture     Final   Value:        BLOOD CULTURE RECEIVED NO GROWTH TO DATE CULTURE WILL BE HELD FOR 5 DAYS BEFORE ISSUING A FINAL NEGATIVE REPORT     Performed at [facility]   Report Status PENDING   Incomplete     Studies: Dg Chest Port 1 View  08/17/2014   CLINICAL DATA:  Initial encounter.  EXAM: PORTABLE CHEST - 1 VIEW  COMPARISON:  01/08/2014  FINDINGS: Pacer with leads at right ventricle. Midline trachea. Moderate cardiomegaly. Prior median sternotomy. No pleural effusion or pneumothorax. No congestive failure. No lobar consolidation. Bilateral calcified pleural plaques.  IMPRESSION: Cardiomegaly without congestive failure.  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   Electronically Signed   By: Abigail Miyamoto M.D.   On: 08/17/2014 15:59    Scheduled Meds: . allopurinol  300 mg Oral Daily  . beta carotene w/minerals  1 tablet Oral Daily  . budesonide-formoterol  2 puff Inhalation BID  . captopril  6.25 mg Oral BID  . digoxin  125 mcg Oral QHS  . docusate sodium  400 mg Oral QHS  . dorzolamide-timolol  1 drop Both Eyes BID  . furosemide  40 mg Oral q1800   And  . furosemide  80 mg Oral Q0600  . Influenza vac split quadrivalent PF  0.5 mL Intramuscular Tomorrow-1000  . latanoprost  1 drop Both Eyes QHS  . levothyroxine  125 mcg Oral QAC breakfast  . magnesium oxide  400 mg Oral Daily  . pantoprazole  40 mg Oral Daily  . polyvinyl alcohol  1 drop Both Eyes BID  . potassium chloride  40 mEq Oral Daily   And  . potassium chloride  20 mEq Oral QHS  . sodium chloride  3 mL Intravenous Q12H  . warfarin  2.5 mg Oral Once per day on Sun Mon Tue Wed Thu Fri  . [START ON  08/23/2014] warfarin  5  mg Oral Q Sat-1800  . Warfarin - Pharmacist Dosing Inpatient   Does not apply q1800   Continuous Infusions:    Charlynne Cousins  [facility] Pager [phone]. If 8PM-8AM, please contact night-coverage at [email or web address], password [facility] 08/18/2014, 9:48 AM  LOS: 1 day

## 2014-08-18 NOTE — Consult Note (Signed)
[facility]     Reason for Consult: petechiae    Referring Physician: Dr. Venetia Constable  Active Problems:   Atrial fibrillation, permanent   Hypokalemia   Generalized weakness   Hyponatremia   Rash and nonspecific skin eruption   . allopurinol  300 mg Oral Daily  . beta carotene w/minerals  1 tablet Oral Daily  . budesonide-formoterol  2 puff Inhalation BID  . captopril  6.25 mg Oral BID  . digoxin  125 mcg Oral QHS  . docusate sodium  400 mg Oral QHS  . dorzolamide-timolol  1 drop Both Eyes BID  . furosemide  40 mg Oral q1800   And  . furosemide  80 mg Oral Q0600  . latanoprost  1 drop Both Eyes QHS  . levothyroxine  125 mcg Oral QAC breakfast  . magnesium oxide  400 mg Oral Daily  . pantoprazole  40 mg Oral Daily  . polyvinyl alcohol  1 drop Both Eyes BID  . potassium chloride  40 mEq Oral Daily   And  . potassium chloride  20 mEq Oral QHS  . sodium chloride  3 mL Intravenous Q12H  . warfarin  2.5 mg Oral Once per day on Sun Mon Tue Wed Thu Fri  . [START ON 08/23/2014] warfarin  5 mg Oral Q Sat-1800  . Warfarin - Pharmacist Dosing Inpatient   Does not apply q1800    Recommendations: Hold antibiotics, no systemic signs of infection If it doesn't resolve within 2 weeks consider dermatology (patient already has a dermatologist)  Would consider cipro an allergy if it resolves on its own off of antibiotics  Assessment: He has purpuric-like lesion with no signs or symptoms of any new renal issue to consider HUS/TTP, no headache, doubt HSP with it on the lower legs bilateral though he does have some ankle swelling, no buttock involvement, no renal issues.     Labs reviewed, CRP is up but in the setting of renal insufficiency at baseline, ESR unremarkable.    Antibiotics: cipro x 2 days  HPI: [name] is a 80 y.o. male with multiple medical issues inclduing ischemic cardiomyopathy, afib on coumadin, CAD, who was diagnosed with UTI (patient  though reports he was without urinary complaints) and started on Cipro 10/2.  He started to develop some edema and purple discoloration at the ankles bilateral.  Some soreness of his ankles and ? Edema (hard to tell with pitting edema).     Review of Systems: A comprehensive review of systems was negative.  Past Medical History  Diagnosis Date  . Ischemic cardiomyopathy     EF-35-45%by echo in 2011; first dx in the 90's but again in 2009 & rec'd 1st BiV ICD   . Paroxysmal atrial fibrillation     sinus rhythm on amiodarone  . Asbestos exposure     plaques on CT  . Compression fracture     T7  . Hypertension   . Coronary artery disease     last cath 2009, hx CABG  . Automatic implantable cardioverter-defibrillator in situ     now explanted  . Hypothyroidism   . CHF (congestive heart failure)     hx.  . Myocardial infarction     INFERIOR [phone] wth PTCA; Ant MI 1995 wth PTCA   . Shortness of breath     "only when external pacemaker wasn't working right" (06/17/2013)  . Arthritis     "a touch all over" (06/17/2013)  .  COPD (chronic obstructive pulmonary disease)     "a touch" (8/4//2014)  . Atrial fibrillation, permanent 07/22/2013  . Biventricular cardiac pacemaker in situ 06/2013    [employer]. Jude Allura (pt no longer wished for ICD portion)  . Pacemaker     History  Substance Use Topics  . Smoking status: Never Smoker   . Smokeless tobacco: Never Used  . Alcohol Use: 8.4 oz/week    14 Glasses of wine per week     Comment: glass of wine daily    Family History  Problem Relation Age of Onset  . Heart disease Father    No Known Allergies  OBJECTIVE: Blood pressure 115/57, pulse 74, temperature 98.4 F (36.9 C), temperature source Oral, resp. rate 18, height 5' 11"  (1.803 m), weight 151 lb 6.4 oz (68.675 kg), SpO2 97.00%. General: awake, alert, nad Skin: bialteral ankles with some pitting edema Lungs: CTA B Cor: rrr Abdomen: soft, nt, nd Ext: 1+  edema  Microbiology: Recent Results (from the past 240 hour(s))  URINE CULTURE     Status: None   Collection Time    08/14/14 11:00 AM      Result Value Ref Range Status   Colony Count 50,000 COLONIES/ML   Final   Organism ID, Bacteria Multiple bacterial morphotypes present, none   Final   Organism ID, Bacteria predominant. Suggest appropriate recollection if    Final   Organism ID, Bacteria clinically indicated.   Final  CULTURE, BLOOD (ROUTINE X 2)     Status: None   Collection Time    08/17/14  4:48 PM      Result Value Ref Range Status   Specimen Description BLOOD RIGHT ARM   Final   Special Requests BOTTLES DRAWN AEROBIC AND ANAEROBIC 10CC EACH   Final   Culture  Setup Time     Final   Value: 08/17/2014 22:18     Performed at [facility]   Culture     Final   Value:        BLOOD CULTURE RECEIVED NO GROWTH TO DATE CULTURE WILL BE HELD FOR 5 DAYS BEFORE ISSUING A FINAL NEGATIVE REPORT     Performed at [facility]   Report Status PENDING   Incomplete  CULTURE, BLOOD (ROUTINE X 2)     Status: None   Collection Time    08/17/14  4:56 PM      Result Value Ref Range Status   Specimen Description BLOOD LEFT ARM   Final   Special Requests BOTTLES DRAWN AEROBIC ONLY 10CC   Final   Culture  Setup Time     Final   Value: 08/17/2014 22:18     Performed at [facility]   Culture     Final   Value:        BLOOD CULTURE RECEIVED NO GROWTH TO DATE CULTURE WILL BE HELD FOR 5 DAYS BEFORE ISSUING A FINAL NEGATIVE REPORT     Performed at [facility]   Report Status PENDING   Incomplete    Renesme Kerrigan, [name], [place] [email or web address] [phone] pager  [phone] cell 08/18/2014, 1:56 PM

## 2014-08-18 NOTE — Progress Notes (Signed)
Pt.is A/Ox4 and is ambulatory with 1 person assist and walker. He had no c/o pain and no signs of symptoms of distress.

## 2014-08-19 LAB — PROTIME-INR
INR: 2.23 — ABNORMAL HIGH (ref 0.00–1.49)
Prothrombin Time: 24.7 seconds — ABNORMAL HIGH (ref 11.6–15.2)

## 2014-08-19 LAB — CORTISOL: Cortisol, Plasma: 22.6 ug/dL

## 2014-08-19 NOTE — Plan of Care (Signed)
Problem: Phase I Progression Outcomes Goal: Hemodynamically stable Outcome: Progressing Patient continues to have general weakness this shift.  No acute events occurred.  NS at 75 mL/hr.  VSS.  No ectopy observed on tele.  Will continue to monitor patient condition.

## 2014-08-19 NOTE — Progress Notes (Signed)
Discharge teachings given to pt and daughter .Borth verbalized understanding. Wheeled to lobby by NT

## 2014-08-19 NOTE — Discharge Instructions (Addendum)
Information on my medicine - Coumadin   (Warfarin)  This medication education was reviewed with me or my healthcare representative as part of my discharge preparation.  The pharmacist that spoke with me during my hospital stay was:  Arman Bogus, [facility]  Why was Coumadin prescribed for you? Coumadin was prescribed for you because you have a blood clot or a medical condition that can cause an increased risk of forming blood clots. Blood clots can cause serious health problems by blocking the flow of blood to the heart, lung, or brain. Coumadin can prevent harmful blood clots from forming. As a reminder your indication for Coumadin is:   Select from menu  What test will check on my response to Coumadin? While on Coumadin (warfarin) you will need to have an INR test regularly to ensure that your dose is keeping you in the desired range. The INR (international normalized ratio) number is calculated from the result of the laboratory test called prothrombin time (PT).  If an INR APPOINTMENT HAS NOT ALREADY BEEN MADE FOR YOU please schedule an appointment to have this lab work done by your health care provider within 7 days. Your INR goal is usually a number between:  2 to 3 or your provider may give you a more narrow range like 2-2.5.  Ask your health care provider during an office visit what your goal INR is.  What  do you need to  know  About  COUMADIN? Take Coumadin (warfarin) exactly as prescribed by your healthcare provider about the same time each day.  DO NOT stop taking without talking to the doctor who prescribed the medication.  Stopping without other blood clot prevention medication to take the place of Coumadin may increase your risk of developing a new clot or stroke.  Get refills before you run out.  What do you do if you miss a dose? If you miss a dose, take it as soon as you remember on the same day then continue your regularly scheduled regimen the next day.  Do not take two doses  of Coumadin at the same time.  Important Safety Information A possible side effect of Coumadin (Warfarin) is an increased risk of bleeding. You should call your healthcare provider right away if you experience any of the following:   Bleeding from an injury or your nose that does not stop.   Unusual colored urine (red or dark brown) or unusual colored stools (red or black).   Unusual bruising for unknown reasons.   A serious fall or if you hit your head (even if there is no bleeding).  Some foods or medicines interact with Coumadin (warfarin) and might alter your response to warfarin. To help avoid this:   Eat a balanced diet, maintaining a consistent amount of Vitamin K.   Notify your provider about major diet changes you plan to make.   Avoid alcohol or limit your intake to 1 drink for women and 2 drinks for men per day. (1 drink is 5 oz. wine, 12 oz. beer, or 1.5 oz. liquor.)  Make sure that ANY health care provider who prescribes medication for you knows that you are taking Coumadin (warfarin).  Also make sure the healthcare provider who is monitoring your Coumadin knows when you have started a new medication including herbals and non-prescription products.  Coumadin (Warfarin)  Major Drug Interactions  Increased Warfarin Effect Decreased Warfarin Effect  Alcohol (large quantities) Antibiotics (esp. Septra/Bactrim, Flagyl, Cipro) Amiodarone (Cordarone) Aspirin (ASA) Cimetidine (Tagamet)  Megestrol (Megace) NSAIDs (ibuprofen, naproxen, etc.) Piroxicam (Feldene) Propafenone (Rythmol SR) Propranolol (Inderal) Isoniazid (INH) Posaconazole (Noxafil) Barbiturates (Phenobarbital) Carbamazepine (Tegretol) Chlordiazepoxide (Librium) Cholestyramine (Questran) Griseofulvin Oral Contraceptives Rifampin Sucralfate (Carafate) Vitamin K   Coumadin (Warfarin) Major Herbal Interactions  Increased Warfarin Effect Decreased Warfarin Effect  Garlic Ginseng Ginkgo biloba Coenzyme  Q10 Green tea St. Johns wort    Coumadin (Warfarin) FOOD Interactions  Eat a consistent number of servings per week of foods HIGH in Vitamin K (1 serving =  cup)  Collards (cooked, or boiled & drained) Kale (cooked, or boiled & drained) Mustard greens (cooked, or boiled & drained) Parsley *serving size only =  cup Spinach (cooked, or boiled & drained) Swiss chard (cooked, or boiled & drained) Turnip greens (cooked, or boiled & drained)  Eat a consistent number of servings per week of foods MEDIUM-HIGH in Vitamin K (1 serving = 1 cup)  Asparagus (cooked, or boiled & drained) Broccoli (cooked, boiled & drained, or raw & chopped) Brussel sprouts (cooked, or boiled & drained) *serving size only =  cup Lettuce, raw (green leaf, endive, romaine) Spinach, raw Turnip greens, raw & chopped   These websites have more information on Coumadin (warfarin):  FailFactory.se; VeganReport.com.au;

## 2014-08-19 NOTE — Discharge Summary (Signed)
Physician Discharge Summary  [name] Kuan KYH:[id number] DOB: [birth year] DOA: 08/17/2014  PCP: Thalia Party  Admit date: 08/17/2014 Discharge date: 08/19/2014  Time spent: 35 minutes  Recommendations for Outpatient Follow-up:  1. Follow up with PCP in 2 weeks if rash not improve refer to dermatology for biopsy.  Discharge Diagnoses:  Active Problems:   Rash and nonspecific skin eruption   Atrial fibrillation, permanent   Hypokalemia   Generalized weakness   Hyponatremia   Discharge Condition: stable  Diet recommendation:  Low sodium diet  Filed Weights   08/17/14 1937 08/18/14 0511 08/19/14 0614  Weight: 69.536 kg (153 lb 4.8 oz) 68.675 kg (151 lb 6.4 oz) 69.4 kg (153 lb)    History of present illness:  80 y.o. male  Who was sent over from the urgent care. He was seen in the last few days by his cardiologist for weakness, LE edema, and vision changes. He had a U/A done that showed, trace LE. He was started on cipro Friday night. SAturday, his daughter noticed a red rash appearing on his feet and his ankles.  Rash is the same on each leg.  His platelets are about at baseline 130s, his Cr is at baseline, his Na and his K are low. His INR was therapeutic. His legs are only painful if he walks  Family says he has a fever (98.6) as "his normal" is 97.6--- daughter was adamant this is HIS normal.  No chills, no dysuria  Patient was appropriate and answered metolazone.    Hospital Course:  Rash and nonspecific skin eruption ? Due to cipro: - peripheral smear pending.  - ESR and CRP elevated. Has chronic thrombocytopenia.  - has remained afebrile, no leukocytosis and plt's stable.  - consulted ID agreed to monitor and follow up with PCP  Generalized weakness  - TSh 2.2, follow up with PCP  Hyponatremia/ Hypokalemia  - due to decrease intravascular vol.  - resolved with IV fluids.  Atrial fibrillation, permanent  - rate controlled.   Chronic thrombocytopenia:  - stable  have been ranging from 90'-130's.    Procedures:  CXR  Consultations:  ID  Discharge Exam: Filed Vitals:   08/19/14 0614  BP: 105/56  Pulse: 76  Temp: 98.9 F (37.2 C)  Resp: 17    General: A&O x3 Cardiovascular: RRR Respiratory: good air movement CTA B/L  Discharge Instructions You were cared for by a hospitalist during your hospital stay. If you have any questions about your discharge medications or the care you received while you were in the hospital after you are discharged, you can call the unit and asked to speak with the hospitalist on call if the hospitalist that took care of you is not available. Once you are discharged, your primary care physician will handle any further medical issues. Please note that NO REFILLS for any discharge medications will be authorized once you are discharged, as it is imperative that you return to your primary care physician (or establish a relationship with a primary care physician if you do not have one) for your aftercare needs so that they can reassess your need for medications and monitor your lab values.  Discharge Instructions   Diet - low sodium heart healthy    Complete by:  As directed      Increase activity slowly    Complete by:  As directed           Current Discharge Medication List    CONTINUE these medications which have  NOT CHANGED   Details  allopurinol (ZYLOPRIM) 300 MG tablet Take 1 tablet (300 mg total) by mouth daily. Qty: 30 tablet, Refills: 6    beta carotene w/minerals (OCUVITE) tablet Take 1 tablet by mouth daily.    bimatoprost (LUMIGAN) 0.03 % ophthalmic solution Place 1 drop into both eyes at bedtime.     budesonide-formoterol (SYMBICORT) 160-4.5 MCG/ACT inhaler Inhale 2 puffs into the lungs 2 (two) times daily.    captopril (CAPOTEN) 12.5 MG tablet Take 6.25 mg by mouth 2 (two) times daily.    digoxin (LANOXIN) 0.125 MG tablet Take 1 tablet (125 mcg total) by mouth at bedtime. Qty: 90 tablet,  Refills: 3    docusate sodium (COLACE) 100 MG capsule Take 400 mg by mouth at bedtime.    dorzolamide-timolol (COSOPT) 22.3-6.8 MG/ML ophthalmic solution Place 1 drop into both eyes 2 (two) times daily.     FIBER PO Take 5 capsules by mouth at bedtime.    FOLIC ACID PO Take 1 tablet by mouth daily.    furosemide (LASIX) 80 MG tablet Take 40-80 mg by mouth 2 (two) times daily. 70m in am and 40pm in pm    Iron-Vitamin C (VITRON-C PO) Take 1 tablet by mouth 2 (two) times daily.     levothyroxine (SYNTHROID, LEVOTHROID) 125 MCG tablet Take 125 mcg by mouth daily before breakfast.    magnesium oxide (MAG-OX) 400 MG tablet Take 400 mg by mouth daily.    MAGNESIUM PO Take 1 tablet by mouth every morning.    metolazone (ZAROXOLYN) 2.5 MG tablet Take 2 times a week or as needed to keep weight down to less than 160 lbs. Qty: 30 tablet, Refills: 3    Omega 3-6-9 Fatty Acids (TRIPLE OMEGA-3-6-9) CAPS Take by mouth.    pantoprazole (PROTONIX) 40 MG tablet Take 1 tablet (40 mg total) by mouth daily. Qty: 30 tablet, Refills: 11   Associated Diagnoses: Cough    polyvinyl alcohol (LIQUIFILM TEARS) 1.4 % ophthalmic solution Place 1 drop into both eyes 2 (two) times daily.    potassium chloride SA (K-DUR,KLOR-CON) 20 MEQ tablet Take 20-40 mEq by mouth 2 (two) times daily. 428m in am and 20 meq in pm Takes 1 extra tablet when taking metolazone    warfarin (COUMADIN) 5 MG tablet Take 5 mg by mouth daily at 6 PM. 67m51mn sat only 2.67mg62ml other days      STOP taking these medications     ciprofloxacin (CIPRO) 250 MG tablet        No Known Allergies Follow-up Information   Follow up with [name],[name].   Contact information:   [phone]    The results of significant diagnostics from this hospitalization (including imaging, microbiology, ancillary and laboratory) are listed below for reference.    Significant Diagnostic Studies: Dg Chest Port 1  View  08/17/2014   CLINICAL DATA:  Initial encounter.  EXAM: PORTABLE CHEST - 1 VIEW  COMPARISON:  01/08/2014  FINDINGS: Pacer with leads at right ventricle. Midline trachea. Moderate cardiomegaly. Prior median sternotomy. No pleural effusion or pneumothorax. No congestive failure. No lobar consolidation. Bilateral calcified pleural plaques.  IMPRESSION: Cardiomegaly without congestive failure.  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   Electronically Signed   By: KyleAbigail Miyamoto.   On: 08/17/2014 15:59    Microbiology: Recent Results (from the past 240 hour(s))  URINE CULTURE     Status: None  Collection Time    08/14/14 11:00 AM      Result Value Ref Range Status   Colony Count 50,000 COLONIES/ML   Final   Organism ID, Bacteria Multiple bacterial morphotypes present, none   Final   Organism ID, Bacteria predominant. Suggest appropriate recollection if    Final   Organism ID, Bacteria clinically indicated.   Final  CULTURE, BLOOD (ROUTINE X 2)     Status: None   Collection Time    08/17/14  4:48 PM      Result Value Ref Range Status   Specimen Description BLOOD RIGHT ARM   Final   Special Requests BOTTLES DRAWN AEROBIC AND ANAEROBIC 10CC EACH   Final   Culture  Setup Time     Final   Value: 08/17/2014 22:18     Performed at [facility]   Culture     Final   Value:        BLOOD CULTURE RECEIVED NO GROWTH TO DATE CULTURE WILL BE HELD FOR 5 DAYS BEFORE ISSUING A FINAL NEGATIVE REPORT     Performed at [facility]   Report Status PENDING   Incomplete  CULTURE, BLOOD (ROUTINE X 2)     Status: None   Collection Time    08/17/14  4:56 PM      Result Value Ref Range Status   Specimen Description BLOOD LEFT ARM   Final   Special Requests BOTTLES DRAWN AEROBIC ONLY 10CC   Final   Culture  Setup Time     Final   Value: 08/17/2014 22:18     Performed at [facility]   Culture     Final   Value:        BLOOD CULTURE  RECEIVED NO GROWTH TO DATE CULTURE WILL BE HELD FOR 5 DAYS BEFORE ISSUING A FINAL NEGATIVE REPORT     Performed at [facility]   Report Status PENDING   Incomplete     Labs: Basic Metabolic Panel:  Recent Labs Lab 08/14/14 1206 08/17/14 1533 08/17/14 1553 08/18/14 0305  NA 132* 130* 129* 134*  K 4.8 3.3* 3.1* 3.6*  CL 92* 87* 90* 93*  CO2 30 28  --  28  GLUCOSE 167* 114* 113* 109*  BUN 47* 35* 32* 33*  CREATININE 1.40* 1.17 1.20 1.16  CALCIUM 9.4 8.6  --  8.6   Liver Function Tests:  Recent Labs Lab 08/14/14 1206 08/17/14 1533  AST 25 26  ALT 15 16  ALKPHOS 94 111  BILITOT 1.2 1.4*  PROT 7.1 7.4  ALBUMIN 4.5 3.4*   No results found for this basename: LIPASE, AMYLASE,  in the last 168 hours No results found for this basename: AMMONIA,  in the last 168 hours CBC:  Recent Labs Lab 08/14/14 1100 08/17/14 1533 08/17/14 1553 08/18/14 0305  WBC 7.1 6.2  --  5.9  NEUTROABS  --  5.3  --   --   HGB 11.9* 11.9* 13.3 11.4*  HCT 35.6* 34.8* 39.0 34.1*  MCV 94.2 92.3  --  92.9  PLT 106* 129*  --  134*   Cardiac Enzymes: No results found for this basename: CKTOTAL, CKMB, CKMBINDEX, TROPONINI,  in the last 168 hours BNP: BNP (last 3 results)  Recent Labs  12/31/13 0418 01/22/14 1121  PROBNP 1900.0* 3151.00*   CBG: No results found for this basename: GLUCAP,  in the last 168 hours     Signed:  Charlynne Cousins  [facility] 08/19/2014, 11:10 AM

## 2014-08-19 NOTE — Progress Notes (Signed)
Physical Therapy Treatment Patient Details Name: [name] MRN: [id number] DOB: Mar 30, 1926 Today's Date: 08/19/2014    History of Present Illness [name] is a 80 y.o. male Who was sent over from the urgent care. He was seen in the last few days by his cardiologist for weakness, LE edema, and vision changes.  He was started on cipro Friday night. Saturday, his daughter noticed a red rash appearing on his feet and his ankles and reported wheezing PTA.His platelets are about at baseline 130s, his Cr is at baseline, his Na and his K are low. His INR was therapeutic. His legs are only painful if he walks.    PT Comments    Pt was seen with his daughter, who will be caregiver at home.  Has increased control of standing and balance today, demonstrating improvement.  Has need for close supervision of his care, and daughter verbalizes understanding.  Follow Up Recommendations  Home health PT;Supervision/Assistance - 24 hour     Equipment Recommendations  None recommended by PT    Recommendations for Other Services Other (comment)     Precautions / Restrictions Precautions Precautions: Fall Restrictions Weight Bearing Restrictions: No    Mobility  Bed Mobility Overal bed mobility: Modified Independent                Transfers Overall transfer level: Needs assistance Equipment used: Rolling walker (2 wheeled) Transfers: Sit to/from [facility] Sit to Stand: Supervision Stand pivot transfers: Min guard       General transfer comment: supervision due to pt feeling slightly light headed with sitting and intial standing. No LOB  Ambulation/Gait Ambulation/Gait assistance: Min guard Ambulation Distance (Feet): 250 Feet Assistive device: Rolling walker (2 wheeled) Gait Pattern/deviations: Step-through pattern;Decreased stride length;Wide base of support;Drifts right/left Gait velocity: decreased Gait velocity interpretation: Below normal speed for  age/gender General Gait Details: Pt has limited control of walker near obstacles, tends to let the walker shift over a little.     Stairs Stairs: Yes Stairs assistance: Min guard Stair Management: One rail Right;Step to pattern;Alternating pattern Number of Stairs: 3 General stair comments: Slow pace with daughter who is caregiver present  Wheelchair Mobility    Modified Rankin (Stroke Patients Only)       Balance Overall balance assessment: Needs assistance Sitting-balance support: Feet supported;Single extremity supported Sitting balance-Leahy Scale: Good   Postural control: Posterior lean Standing balance support: Bilateral upper extremity supported Standing balance-Leahy Scale: Fair Standing balance comment: Reminders for pacing and not rushing into gait until his balance is set                    Cognition Arousal/Alertness: Awake/alert Behavior During Therapy: WFL for tasks assessed/performed Overall Cognitive Status: Within Functional Limits for tasks assessed                      Exercises      General Comments General comments (skin integrity, edema, etc.): Pt has upright posture but  can correct with cues.  However, lapses back into flexed posture      Pertinent Vitals/Pain Pain Assessment: No/denies pain BP was 105/56, pulse 76 and O2 sats were 97%.    Home Living                      Prior Function            PT Goals (current goals can now be found in the  care plan section) Acute Rehab PT Goals Patient Stated Goal: Wants to get home today Progress towards PT goals: Progressing toward goals    Frequency  Min 3X/week    PT Plan Current plan remains appropriate    Co-evaluation             End of Session   Activity Tolerance: Patient tolerated treatment well Patient left: in chair;with call bell/phone within reach;with family/visitor present     Time: 1333-1400 PT Time Calculation (min): 27 min  Charges:   $Gait Training: 8-22 mins $Therapeutic Activity: 8-22 mins                    G Codes:      Ramond Dial 08-31-2014, 2:16 PM Mee Hives, PT MS Acute Rehab Dept. Number: [phone]

## 2014-08-21 NOTE — Care Management Note (Addendum)
  Page 1 of 1   08/21/2014     11:04:50 AM CARE MANAGEMENT NOTE 08/21/2014  Patient:  [name], [name]   Account Number:  [id number]  Date Initiated:  08/21/2014  Documentation initiated by:  Tammy Ericsson  Subjective/Objective Assessment:   OBSERVATION Case for LE Rash     Action/Plan:   Anticipated DC Date:     Anticipated DC Plan:           Choice offered to / List presented to:             Status of service:  Completed, signed off Medicare Important Message given?   (If response is "NO", the following Medicare IM given date fields will be blank) Date Medicare IM given:   Medicare IM given by:   Date Additional Medicare IM given:   Additional Medicare IM given by:    Discharge Disposition:  [place]  Per UR Regulation:    If discussed at Long Length of Stay Meetings, dates discussed:    Comments:  Dryden Tapley RN, BSN, MSHL, CCM  Nurse - Case Manager,  ([facility] [place])  [phone]  08/21/2014 [facility] CM NOTE: Issue:  Hx/o patient d/c home without [facility] PT services being arranged.  No order or face to face  placed prior to discharge. Case reviewed:  Observation patient with PT RECS:  HH PT; however, no OT eval this admission. POC established via late entry [facility] order.  Dr. Dyann Kief will cover for DR. Olevia Bowens [facility] Provider: Hx/o past HHS with [facility] notified PCP:  Thalia Party Phone: [phone] Late d/c plan:  HHS: Disease MGMT: Medication MGMT Assess for compliance with MD appt's and healthcare instructions. HSE OT:  Eval and treat PCP:  Thalia Party  Phone: [phone] CM notified wife of d/c update plan.

## 2014-08-22 ENCOUNTER — Ambulatory Visit (INDEPENDENT_AMBULATORY_CARE_PROVIDER_SITE_OTHER): Payer: Medicare Other | Admitting: Pharmacist Clinician (PhC)/ Clinical Pharmacy Specialist

## 2014-08-22 DIAGNOSIS — I4821 Permanent atrial fibrillation: Secondary | ICD-10-CM

## 2014-08-22 DIAGNOSIS — I482 Chronic atrial fibrillation: Secondary | ICD-10-CM

## 2014-08-22 DIAGNOSIS — Z7901 Long term (current) use of anticoagulants: Secondary | ICD-10-CM

## 2014-08-22 DIAGNOSIS — I4891 Unspecified atrial fibrillation: Secondary | ICD-10-CM

## 2014-08-22 LAB — POCT INR: INR: 2.7

## 2014-08-23 LAB — CULTURE, BLOOD (ROUTINE X 2)
CULTURE: NO GROWTH
Culture: NO GROWTH

## 2014-08-25 ENCOUNTER — Telehealth: Payer: Self-pay | Admitting: *Deleted

## 2014-08-25 DIAGNOSIS — I4891 Unspecified atrial fibrillation: Secondary | ICD-10-CM

## 2014-08-25 NOTE — Telephone Encounter (Signed)
Daughter, Jenny Reichmann, notified Dr. Sallyanne Kuster is in agreement for Mr. Giacomo to see Dr. Caryl Comes for a second opinion.  Order placed.

## 2014-08-27 ENCOUNTER — Ambulatory Visit (HOSPITAL_COMMUNITY)
Admission: RE | Admit: 2014-08-27 | Discharge: 2014-08-27 | Disposition: A | Discharge status: Home / Self Care | Payer: Medicare Other | Source: Ambulatory Visit | Attending: Family Medicine | Admitting: Family Medicine

## 2014-08-27 ENCOUNTER — Other Ambulatory Visit (HOSPITAL_COMMUNITY): Payer: Self-pay | Admitting: Family Medicine

## 2014-08-27 DIAGNOSIS — Z7901 Long term (current) use of anticoagulants: Secondary | ICD-10-CM | POA: Diagnosis not present

## 2014-08-27 DIAGNOSIS — M25569 Pain in unspecified knee: Secondary | ICD-10-CM

## 2014-08-27 DIAGNOSIS — R600 Localized edema: Secondary | ICD-10-CM | POA: Insufficient documentation

## 2014-08-27 DIAGNOSIS — M7989 Other specified soft tissue disorders: Secondary | ICD-10-CM

## 2014-08-27 NOTE — Progress Notes (Signed)
Bilateral lower extremity venous duplex completed:  No evidence of DVT, superficial thrombosis, or Baker's cyst.   

## 2014-09-03 ENCOUNTER — Ambulatory Visit (INDEPENDENT_AMBULATORY_CARE_PROVIDER_SITE_OTHER): Payer: Medicare Other | Admitting: Pharmacist Clinician (PhC)/ Clinical Pharmacy Specialist

## 2014-09-03 DIAGNOSIS — I482 Chronic atrial fibrillation: Secondary | ICD-10-CM

## 2014-09-03 DIAGNOSIS — I4821 Permanent atrial fibrillation: Secondary | ICD-10-CM

## 2014-09-03 LAB — POCT INR: INR: 1.3

## 2014-09-16 ENCOUNTER — Encounter: Payer: Self-pay | Admitting: Internal Medicine

## 2014-09-16 ENCOUNTER — Ambulatory Visit (INDEPENDENT_AMBULATORY_CARE_PROVIDER_SITE_OTHER): Payer: Medicare Other | Admitting: Internal Medicine

## 2014-09-16 VITALS — BP 104/60 | HR 86 | Ht 71.0 in | Wt 159.0 lb

## 2014-09-16 DIAGNOSIS — I5042 Chronic combined systolic (congestive) and diastolic (congestive) heart failure: Secondary | ICD-10-CM

## 2014-09-16 DIAGNOSIS — I2510 Atherosclerotic heart disease of native coronary artery without angina pectoris: Secondary | ICD-10-CM

## 2014-09-16 DIAGNOSIS — I4821 Permanent atrial fibrillation: Secondary | ICD-10-CM

## 2014-09-16 DIAGNOSIS — I482 Chronic atrial fibrillation: Secondary | ICD-10-CM

## 2014-09-16 DIAGNOSIS — I255 Ischemic cardiomyopathy: Secondary | ICD-10-CM

## 2014-09-16 DIAGNOSIS — Z950 Presence of cardiac pacemaker: Secondary | ICD-10-CM

## 2014-09-16 LAB — MDC_IDC_ENUM_SESS_TYPE_INCLINIC
Battery Remaining Longevity: 55.2 mo
Brady Statistic RA Percent Paced: 0 %
Brady Statistic RV Percent Paced: 98 %
Date Time Interrogation Session: 20151103172834
Implantable Pulse Generator Model: 3242
Lead Channel Impedance Value: 475 Ohm
Lead Channel Impedance Value: 600 Ohm
Lead Channel Pacing Threshold Amplitude: 1 V
Lead Channel Pacing Threshold Amplitude: 1 V
Lead Channel Pacing Threshold Amplitude: 1.75 V
Lead Channel Pacing Threshold Pulse Width: 0.4 ms
Lead Channel Pacing Threshold Pulse Width: 1 ms
Lead Channel Sensing Intrinsic Amplitude: 8.1 mV
Lead Channel Setting Pacing Amplitude: 2.75 V
MDC IDC MSMT BATTERY VOLTAGE: 2.96 V
MDC IDC MSMT LEADCHNL LV PACING THRESHOLD AMPLITUDE: 1.75 V
MDC IDC MSMT LEADCHNL LV PACING THRESHOLD PULSEWIDTH: 1 ms
MDC IDC MSMT LEADCHNL RV PACING THRESHOLD PULSEWIDTH: 0.4 ms
MDC IDC PG SERIAL: 7515957
MDC IDC SET LEADCHNL LV PACING PULSEWIDTH: 1 ms
MDC IDC SET LEADCHNL RV PACING AMPLITUDE: 2.5 V
MDC IDC SET LEADCHNL RV PACING PULSEWIDTH: 0.4 ms
MDC IDC SET LEADCHNL RV SENSING SENSITIVITY: 6 mV

## 2014-09-16 NOTE — Patient Instructions (Signed)
Your physician recommends that you continue on your current medications as directed. Please refer to the Current Medication list given to you today.  No follow up is needed at this time with Dr. Klein.  

## 2014-09-16 NOTE — Progress Notes (Signed)
ELECTROPHYSIOLOGY CONSULT NOTE  Patient ID: [name], MRN: [id number], DOB/AGE: 1926/04/08 80 y.o. Admit date: (Not on file) Date of Consult: 09/16/2014  Primary Physician: Thalia Party Primary Cardiologist: Billee Cashing Chief Complaint: 2nd opinion   HPI [name] is a 80 y.o. male  Seen with permanent atrial fibrillation with a previously implanted device we just doesn't feel very good. He is wondering whether there is anything he also can be done with his device to improve his symptoms.  He has a history CRT implantation. He initially had a pacemaker put in for complete heart block and underwent upgrade. This was complicated by erosion requiring explantation 2014. A Permanent pacemaker was implanted was complicated by DVT of the left IJ and deterioration. A new CRT device was implanted. there've been multiple efforts to address his heart failure including device reprogramming.  He has ischemic heart disease. His prior bypass surgery. Last catheterization was 2009. He has not had interval Myoview  His symptoms He thinks are worse since his device generator was replaced in from left-to-right. The only notable change was that his LV RV offset was changed from 0--30 ms. There've been no significant changes in medications.  Echocardiogram 3/15 demonstrated ejection fraction 25-30% with moderate MR and severe left atrial enlargement. He also has moderate to severe TR and right atrial enlargement.   Past Medical History  Diagnosis Date  . Ischemic cardiomyopathy     EF-35-45%by echo in 2011; first dx in the 90's but again in 2009 & rec'd 1st BiV ICD   . Paroxysmal atrial fibrillation     sinus rhythm on amiodarone  . Asbestos exposure     plaques on CT  . Compression fracture     T7  . Hypertension   . Coronary artery disease     last cath 2009, hx CABG  . Automatic implantable cardioverter-defibrillator in situ     now explanted  . Hypothyroidism   . CHF (congestive heart  failure)     hx.  . Myocardial infarction     INFERIOR [phone] wth PTCA; Ant MI 1995 wth PTCA   . Shortness of breath     "only when external pacemaker wasn't working right" (06/17/2013)  . Arthritis     "a touch all over" (06/17/2013)  . COPD (chronic obstructive pulmonary disease)     "a touch" (8/4//2014)  . Atrial fibrillation, permanent 07/22/2013  . Biventricular cardiac pacemaker in situ 06/2013    St. Jude Allura (pt no longer wished for ICD portion)  . Pacemaker       Surgical History:  Past Surgical History  Procedure Laterality Date  . Inguinal hernia repair Right   . Coronary artery bypass graft  11/01/1994    SVG to first diagonal, to distal RCA, to the ramus intermedius artery, and LIMA the LAD  . 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 Cataract extraction w/ intraocular lens  implant, bilateral Bilateral   . Icd lead removal Left 05/27/2013    Procedure: ICD LEAD REMOVAL;  Surgeon: Evans Lance, MD;  Location: [facility];  Service: Cardiovascular;  Laterality: Left;  . Bi-ventricular pacemaker insertion (crt-p) Right 06/17/2013    St Jude  . Pacemaker removal Left 05/2013  . Coronary angioplasty with stent placement  12/17/2001    to [place] wth Zeta stent  . Cardiac catheterization  2009    Patent LIMA-LAD; patent VG-diag wth mild in-stent restenosis; patent VG-ramus intemedius; patent VG-RCA wth 70% mid post lat stenosis  . Cardiac catheterization  03/16/2006    patent LIMA, patent VGs;  EF 30%  . Cardioversion  10/12/2010    successful cardioversion  . Coronary angioplasty  [phone]    LAD;Inf MI PTCA-RCA; ant MI PTCA- LAD     Home Meds: Prior to Admission medications   Medication Sig Start Date End Date Taking? Authorizing Provider  allopurinol (ZYLOPRIM) 300 MG  tablet Take 1 tablet (300 mg total) by mouth daily. 03/11/14  Yes Mihai [name], MD  bimatoprost (LUMIGAN) 0.03 % ophthalmic solution Place 1 drop into both eyes at bedtime.    Yes Historical Provider, MD  budesonide-formoterol (SYMBICORT) 160-4.5 MCG/ACT inhaler Inhale 2 puffs into the lungs 2 (two) times daily.   Yes Historical Provider, MD  captopril (CAPOTEN) 12.5 MG tablet Take 6.25 mg by mouth 2 (two) times daily.   Yes Historical Provider, MD  digoxin (LANOXIN) 0.125 MG tablet Take 1 tablet (125 mcg total) by mouth at bedtime. 03/13/14  Yes Mihai [name], MD  docusate sodium (COLACE) 100 MG capsule Take 400 mg by mouth at bedtime.   Yes Historical Provider, MD  dorzolamide-timolol (COSOPT) 22.3-6.8 MG/ML ophthalmic solution Place 1 drop into both eyes 2 (two) times daily.  11/25/13  Yes Historical Provider, MD  FIBER PO Take 5 capsules by mouth at bedtime.   Yes Historical Provider, MD  FOLIC ACID PO Take 1 tablet by mouth daily.   Yes Historical Provider, MD  furosemide (LASIX) 80 MG tablet Take 40-80 mg by mouth 2 (two) times daily. 80mg  in am and 40pm in pm   Yes Historical Provider, MD  Iron-Vitamin C (VITRON-C PO) Take 1 tablet by mouth 2 (two) times daily.    Yes Historical Provider, MD  levothyroxine (SYNTHROID, LEVOTHROID) 125 MCG tablet Take 125 mcg by mouth daily before breakfast.   Yes Historical Provider, MD  magnesium oxide (MAG-OX) 400 MG tablet Take 400 mg by mouth daily.   Yes Historical Provider, MD  metolazone (ZAROXOLYN) 2.5 MG tablet Take 2 times a week or as needed to keep weight down to less than 160 lbs. 04/22/14  Yes Mihai [name], MD  Omega 3-6-9 Fatty Acids (TRIPLE OMEGA-3-6-9) CAPS Take by mouth.   Yes Historical Provider, MD  pantoprazole (PROTONIX) 40 MG tablet Take 1 tablet (40 mg total) by mouth daily. 01/22/14  Yes Luke K Kilroy, PA-C  polyvinyl alcohol (LIQUIFILM TEARS) 1.4 % ophthalmic solution Place 1 drop into both eyes 2 (two) times daily.   Yes Historical  Provider, MD  potassium chloride SA (K-DUR,KLOR-CON) 20 MEQ tablet Take 20-40 mEq by mouth 2 (two) times daily. 20meq in am and 20 meq in pm Takes 1 extra tablet when taking metolazone   Yes Historical Provider, MD  warfarin (COUMADIN) 5 MG tablet Take 2.5-5 mg by mouth daily. Takes 1 tablet (5mg ) daily except 1/2 tablet (2.5 mg) every Sat.   Yes Historical Provider, MD    Allergies:  Allergies  Allergen Reactions  . Chocolate Flavor Hives    When eats a lot of chocolate  . Ciprofloxacin Rash    Rash   . Other Hives    Meat tenderizer    History   Social History  . Marital Status: Widowed    Spouse Name: N/A    Number  of Children: N/A  . 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 Smoking status: Never Smoker   . Smokeless tobacco: Never Used  . Alcohol Use: 8.4 oz/week    14 Glasses of wine per week     Comment: glass of wine daily  . Drug Use: No  . Sexual Activity: No   Other Topics Concern  . Not on file   Social History Narrative     Family History  Problem Relation Age of Onset  . Heart disease Father      ROS:  Please see the history of present illness.     All other systems reviewed and negative.    Physical Exam: Blood pressure 104/60, pulse 86, height 5\' 11"  (1.803 m), weight 159 lb (72.122 kg). General: Well developed, well nourished male in no acute distress. Head: Normocephalic, atraumatic, sclera non-icteric, no xanthomas, nares are without discharge. EENT: normal Lymph Nodes:  none Back: without scoliosis/kyphosis , no CVA tendersness Neck: Negative for carotid bruits. JVD not elevated. Lungs: Clear bilaterally to auscultation without wheezes, rales, or rhonchi. Breathing is unlabored. Heart: RRR with S1 S2.  3/6 systolic murmur , rubs, or gallops appreciated. Abdomen: Soft, non-tender, non-distended with normoactive bowel sounds. No hepatomegaly. No  rebound/guarding. No obvious abdominal masses. Msk:  Strength and tone appear normal for age. Extremities: No clubbing or cyanosis. No edema.  Distal pedal pulses are 2+ and equal bilaterally. Skin: Warm and Dry Neuro: Alert and oriented X 3. CN III-XII intact Grossly normal sensory and motor function . Psych:  Responds to questions appropriately with a normal affect.      Labs: Cardiac Enzymes No results for input(s): CKTOTAL, CKMB, TROPONINI in the last 72 hours. CBC Lab Results  Component Value Date   WBC 5.9 08/18/2014   HGB 11.4* 08/18/2014   HCT 34.1* 08/18/2014   MCV 92.9 08/18/2014   PLT 134* 08/18/2014   PROTIME: No results for input(s): LABPROT, INR in the last 72 hours. Chemistry No results for input(s): NA, K, CL, CO2, BUN, CREATININE, CALCIUM, PROT, BILITOT, ALKPHOS, ALT, AST, GLUCOSE in the last 168 hours.  Invalid input(s): LABALBU Lipids Lab Results  Component Value Date   CHOL 152 12/13/2012   HDL 28* 12/13/2012   LDLCALC 92 12/13/2012   TRIG 159* 12/13/2012   BNP PRO B NATRIURETIC PEPTIDE (BNP)  Date/Time Value Ref Range Status  01/22/2014 11:21 AM 3151.00* <451 pg/mL Final  12/31/2013 04:18 AM 1900.0* 0 - 450 pg/mL Final  06/01/2013 04:30 AM 9189.0* 0 - 450 pg/mL Final  05/31/2013 07:51 PM 8821.0* 0 - 450 pg/mL Final   Miscellaneous No results found for: DDIMER  Radiology/Studies:  Dg Chest Port 1 View  08/17/2014   CLINICAL DATA:  Initial encounter.  EXAM: PORTABLE CHEST - 1 VIEW  COMPARISON:  01/08/2014  FINDINGS: Pacer with leads at right ventricle. Midline trachea. Moderate cardiomegaly. Prior median sternotomy. No pleural effusion or pneumothorax. No congestive failure. No lobar consolidation. Bilateral calcified pleural plaques.  IMPRESSION: Cardiomegaly without congestive failure.  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   Electronically Signed   By: Abigail Miyamoto M.D.   On: 08/17/2014 15:59    EKG:   V Pacing with uprigh QRS V1 consistent   CXR stable LV placement and similar position to left sided implant  Assessment and Plan:  Congestive heart failure-chronic-systolic  Atrial fibrillation-permanent  Implantable pacemaker-CRT  Ischemic cardiomyopathy S/P CABG  Mitral regurgitation-severe  Review of echocardiogram suggests that there has been no significant change in valvular function over time. Chest x-ray suggests that the lead positions are similar before and after explantation. The only notable difference was the left ventricular right ventricular offset which has been corrected. I wonder whether, not withstanding his lack of ischemic symptoms, whether there is a progressive ischemic disease given the long duration of his bypasses. This may provide an opportunity for clinical improvement; will defer this to Dr. Thermon Leyland who knows him much better than I.  I have told him that I don't think that there is any significant changes that we can contribute related to his rhythm or his device   [name]

## 2014-09-26 ENCOUNTER — Other Ambulatory Visit: Payer: Self-pay | Admitting: Cardiovascular Disease

## 2014-09-27 ENCOUNTER — Other Ambulatory Visit: Payer: Self-pay | Admitting: Cardiovascular Disease

## 2014-09-29 NOTE — Telephone Encounter (Signed)
Rx refill sent to patient pharmacy   

## 2014-10-07 ENCOUNTER — Encounter: Payer: Self-pay | Admitting: Cardiovascular Disease

## 2014-10-07 ENCOUNTER — Ambulatory Visit (INDEPENDENT_AMBULATORY_CARE_PROVIDER_SITE_OTHER): Payer: Medicare Other | Admitting: Cardiovascular Disease

## 2014-10-07 ENCOUNTER — Ambulatory Visit (INDEPENDENT_AMBULATORY_CARE_PROVIDER_SITE_OTHER): Payer: Medicare Other | Admitting: *Deleted

## 2014-10-07 VITALS — BP 100/52 | HR 80 | Ht 71.0 in | Wt 154.5 lb

## 2014-10-07 DIAGNOSIS — J61 Pneumoconiosis due to asbestos and other mineral fibers: Secondary | ICD-10-CM

## 2014-10-07 DIAGNOSIS — Z7901 Long term (current) use of anticoagulants: Secondary | ICD-10-CM

## 2014-10-07 DIAGNOSIS — I4821 Permanent atrial fibrillation: Secondary | ICD-10-CM

## 2014-10-07 DIAGNOSIS — I4891 Unspecified atrial fibrillation: Secondary | ICD-10-CM

## 2014-10-07 DIAGNOSIS — I482 Chronic atrial fibrillation: Secondary | ICD-10-CM

## 2014-10-07 DIAGNOSIS — Z45018 Encounter for adjustment and management of other part of cardiac pacemaker: Secondary | ICD-10-CM

## 2014-10-07 DIAGNOSIS — I5042 Chronic combined systolic (congestive) and diastolic (congestive) heart failure: Secondary | ICD-10-CM

## 2014-10-07 DIAGNOSIS — I255 Ischemic cardiomyopathy: Secondary | ICD-10-CM

## 2014-10-07 DIAGNOSIS — I5043 Acute on chronic combined systolic (congestive) and diastolic (congestive) heart failure: Secondary | ICD-10-CM

## 2014-10-07 DIAGNOSIS — I2510 Atherosclerotic heart disease of native coronary artery without angina pectoris: Secondary | ICD-10-CM

## 2014-10-07 LAB — MDC_IDC_ENUM_SESS_TYPE_INCLINIC
Battery Remaining Longevity: 55.2 mo
Battery Remaining Longevity: 55.2 mo
Battery Voltage: 2.96 V
Brady Statistic RV Percent Paced: 98 %
Brady Statistic RV Percent Paced: 98 %
Date Time Interrogation Session: 20151124112254
Implantable Pulse Generator Model: 3242
Implantable Pulse Generator Model: 3242
Implantable Pulse Generator Serial Number: 7515957
Lead Channel Impedance Value: 475 Ohm
Lead Channel Impedance Value: 475 Ohm
Lead Channel Impedance Value: 600 Ohm
Lead Channel Pacing Threshold Amplitude: 1.25 V
Lead Channel Pacing Threshold Amplitude: 1.25 V
Lead Channel Pacing Threshold Amplitude: 1.25 V
Lead Channel Pacing Threshold Amplitude: 1.75 V
Lead Channel Pacing Threshold Amplitude: 1.75 V
Lead Channel Pacing Threshold Amplitude: 1.75 V
Lead Channel Pacing Threshold Pulse Width: 0.4 ms
Lead Channel Pacing Threshold Pulse Width: 1 ms
Lead Channel Pacing Threshold Pulse Width: 1 ms
Lead Channel Sensing Intrinsic Amplitude: 7.6 mV
Lead Channel Sensing Intrinsic Amplitude: 7.6 mV
Lead Channel Setting Pacing Amplitude: 2.5 V
Lead Channel Setting Pacing Amplitude: 2.5 V
Lead Channel Setting Pacing Amplitude: 2.75 V
Lead Channel Setting Pacing Amplitude: 2.75 V
Lead Channel Setting Pacing Pulse Width: 0.4 ms
Lead Channel Setting Pacing Pulse Width: 1 ms
Lead Channel Setting Pacing Pulse Width: 1 ms
Lead Channel Setting Sensing Sensitivity: 6 mV
MDC IDC MSMT BATTERY VOLTAGE: 2.96 V
MDC IDC MSMT LEADCHNL LV IMPEDANCE VALUE: 600 Ohm
MDC IDC MSMT LEADCHNL LV PACING THRESHOLD AMPLITUDE: 1.75 V
MDC IDC MSMT LEADCHNL LV PACING THRESHOLD PULSEWIDTH: 1 ms
MDC IDC MSMT LEADCHNL LV PACING THRESHOLD PULSEWIDTH: 1 ms
MDC IDC MSMT LEADCHNL RV PACING THRESHOLD AMPLITUDE: 1.25 V
MDC IDC MSMT LEADCHNL RV PACING THRESHOLD PULSEWIDTH: 0.4 ms
MDC IDC MSMT LEADCHNL RV PACING THRESHOLD PULSEWIDTH: 0.4 ms
MDC IDC MSMT LEADCHNL RV PACING THRESHOLD PULSEWIDTH: 0.4 ms
MDC IDC PG SERIAL: 7515957
MDC IDC SESS DTM: 20151124112150
MDC IDC SET LEADCHNL RV PACING PULSEWIDTH: 0.4 ms
MDC IDC SET LEADCHNL RV SENSING SENSITIVITY: 6 mV
MDC IDC STAT BRADY RA PERCENT PACED: 0 %
MDC IDC STAT BRADY RA PERCENT PACED: 0 %

## 2014-10-07 LAB — POCT INR: INR: 2.1

## 2014-10-07 MED ORDER — IPRATROPIUM BROMIDE 0.03 % NA SOLN: 2.0000 | Freq: Two times a day (BID) | NASAL | Status: DC

## 2014-10-07 NOTE — Patient Instructions (Addendum)
   Your physician recommends that you schedule a follow-up appointment in: 6 weeks with Dr.[name] with device check.  Please expect a phone call from [name], RN regarding your enrollment in the [facility] clinic.

## 2014-10-07 NOTE — Progress Notes (Signed)
Patient ID: [name], male   DOB: 08/20/1926, 80 y.o.   MRN: [id number]     Reason for office visit Combined systolic and diastolic heart failure, permanent atrial fibrillation, CAD s/p CABG, complete heart block,CRT-P  [name] continues to have relatively poor functional status due to congestive heart failure and has functional class III status. His family has noticed increasing edema and confusion when shooting for the previously agreed on "dry weight". We have therefore decreased his target for initiation of metolazone rescue therapy to a weight of 156 pounds. He seems to do better that way.  Interrogation of his device today shows effective biventricular pacing 98% of the time. He is device dependent with complete heart block. Lead parameters are fair and unchanged from previous evaluation. Battery longevity is about 5 years. Thoracic impedance readings suggest hypervolemia throughout late October, when he also had more lower extremity edema.  Roughly a month ago he had a punch biopsy for a persistent rash on his legs. This has left a very slowly healing crusted wound on his left anterior shin. It has a little bit of yellowish discoloration but no active drainage. The surrounding skin is red and there is more swelling on that side compared to the left.  [name] has underlying complete heart block and is pacemaker dependent. He has ischemic cardiomyopathy with a left ventricular ejection fraction around 25-30%. Had coronary bypass surgery in 1995. His cardiac catheterization in 2009 showed all his grafts to be widely patent. His most recent functional study was a nuclear stress test in 2006 that showed a very large inferior, septal and apical scar with relatively preserved perfusion in the anterior and lateral walls. There was no meaningful ischemia.  He had a biventricular device but developed pocket erosion roughly a year after generator change and had to be explanted in July 2014. He had a  "temporary permanent" ventricular pacemaker placed percutaneously for a few weeks. This was complicated by deep venous thrombosis involving the left internal jugular and subclavian vein. A new CRT pacemaker was implanted but during the interval between the 2 devices he developed hard to manage congestive heart failure. Some improvement occurred after the new biventricular device was implanted, but he seemed to be receiving less successful resynchronization therapy, likely due to the more limited pacing vectors available with CRT-P versus CRT-D. He required rehospitalization in February 2015 for acute heart failure with hyponatremia and worsening renal function. LV pacing was changed from LV3 to can with simultaneous RV and LV pacing, Reprogrammed LV3 to RV ring with LV first by 17msec. Since then his heart failure has been easier to manage, even though he still requires higher doses of diuretics than in the past. Saw Dr. Caryl Comes on November 3 and LV-RV offset was changed, replicating the settings with his original device. [name] is confident that he feels worse after that change. He becomes short of breath walking roughly 15-20 feet  He has permanent atrial fibrillation on chronic warfarin anticoagulation.  He has pulmonary fibrosis, presumed to be asbestosis.  Well treated hypertension and hyperlipidemia and compensated hypothyroidism.  Allergies  Allergen Reactions  . Chocolate Flavor Hives    When eats a lot of chocolate  . Ciprofloxacin Rash    Rash   . Other Hives    Meat tenderizer    Current Outpatient Prescriptions  Medication Sig Dispense Refill  . allopurinol (ZYLOPRIM) 300 MG tablet TAKE 1 TABLET (300 MG TOTAL) BY MOUTH DAILY. 30 tablet 6  . bimatoprost (LUMIGAN)  0.03 % ophthalmic solution Place 1 drop into both eyes at bedtime.     . budesonide-formoterol (SYMBICORT) 160-4.5 MCG/ACT inhaler Inhale 2 puffs into the lungs 2 (two) times daily.    . captopril (CAPOTEN) 12.5 MG tablet  TAKE 1/2 TABLET TWICE A DAY. 30 tablet 6  . digoxin (LANOXIN) 0.125 MG tablet Take 1 tablet (125 mcg total) by mouth at bedtime. 90 tablet 3  . docusate sodium (COLACE) 100 MG capsule Take 400 mg by mouth at bedtime.    . dorzolamide-timolol (COSOPT) 22.3-6.8 MG/ML ophthalmic solution Place 1 drop into both eyes 2 (two) times daily.     Marland Kitchen FIBER PO Take 5 capsules by mouth at bedtime.    . furosemide (LASIX) 80 MG tablet Take 40-80 mg by mouth 2 (two) times daily. 80mg  in am and 40pm in pm    . Iron-Vitamin C (VITRON-C PO) Take 1 tablet by mouth 2 (two) times daily.     Marland Kitchen levothyroxine (SYNTHROID, LEVOTHROID) 125 MCG tablet Take 125 mcg by mouth daily before breakfast.    . magnesium oxide (MAG-OX) 400 MG tablet Take 400 mg by mouth daily.    . metolazone (ZAROXOLYN) 2.5 MG tablet Take 2 times a week or as needed to keep weight down to less than 160 lbs. (Patient taking differently: Take 2 times a week or as needed to keep weight down to less than 156 lbs.) 30 tablet 3  . Omega 3-6-9 Fatty Acids (TRIPLE OMEGA-3-6-9) CAPS Take by mouth.    . pantoprazole (PROTONIX) 40 MG tablet Take 1 tablet (40 mg total) by mouth daily. 30 tablet 11  . polyvinyl alcohol (LIQUIFILM TEARS) 1.4 % ophthalmic solution Place 1 drop into both eyes 2 (two) times daily.    . potassium chloride SA (K-DUR,KLOR-CON) 20 MEQ tablet Take 20-40 mEq by mouth 2 (two) times daily. 71meq in am and 20 meq in pm Takes 1 extra tablet when taking metolazone    . VOLTAREN 1 % GEL daily.  1  . warfarin (COUMADIN) 5 MG tablet Take 2.5-5 mg by mouth daily. Takes 1 tablet (5mg ) daily except 1/2 tablet (2.5 mg) every Sat.     No current facility-administered medications for this visit.    Past Medical History  Diagnosis Date  . Ischemic cardiomyopathy     EF-35-45%by echo in 2011; first dx in the 90's but again in 2009 & rec'd 1st BiV ICD   . Paroxysmal atrial fibrillation     sinus rhythm on amiodarone  . Asbestos exposure     plaques  on CT  . Compression fracture     T7  . Hypertension   . Coronary artery disease     last cath 2009, hx CABG  . Automatic implantable cardioverter-defibrillator in situ     now explanted  . Hypothyroidism   . CHF (congestive heart failure)     hx.  . Myocardial infarction     INFERIOR [phone] wth PTCA; Ant MI 1995 wth PTCA   . Shortness of breath     "only when external pacemaker wasn't working right" (06/17/2013)  . Arthritis     "a touch all over" (06/17/2013)  . COPD (chronic obstructive pulmonary disease)     "a touch" (8/4//2014)  . Atrial fibrillation, permanent 07/22/2013  . Biventricular cardiac pacemaker in situ 06/2013    St. Jude Allura (pt no longer wished for ICD portion)  . Pacemaker     Past Surgical History  Procedure Laterality Date  .  Inguinal hernia repair Right   . Coronary artery bypass graft  11/01/1994    SVG to first diagonal, to distal RCA, to the ramus intermedius artery, and LIMA the LAD  . 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 Cataract extraction w/ intraocular lens  implant, bilateral Bilateral   . Icd lead removal Left 05/27/2013    Procedure: ICD LEAD REMOVAL;  Surgeon: Evans Lance, MD;  Location: [facility];  Service: Cardiovascular;  Laterality: Left;  . Bi-ventricular pacemaker insertion (crt-p) Right 06/17/2013    St Jude  . Pacemaker removal Left 05/2013  . Coronary angioplasty with stent placement  12/17/2001    to VG-diag wth Zeta stent  . Cardiac catheterization  2009    Patent LIMA-LAD; patent VG-diag wth mild in-stent restenosis; patent VG-ramus intemedius; patent VG-RCA wth 70% mid post lat stenosis  . Cardiac catheterization  03/16/2006    patent LIMA, patent VGs;  EF 30%  . Cardioversion  10/12/2010    successful cardioversion  . Coronary  angioplasty  [phone]    LAD;Inf MI PTCA-RCA; ant MI PTCA- LAD    Family History  Problem Relation Age of Onset  . Heart disease Father     History   Social History  . Marital Status: Widowed    Spouse Name: N/A    Number of Children: N/A  . 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 Smoking status: Never Smoker   . Smokeless tobacco: Never Used  . Alcohol Use: 8.4 oz/week    14 Glasses of wine per week     Comment: glass of wine daily  . Drug Use: No  . Sexual Activity: No   Other Topics Concern  . Not on file   Social History Narrative    Review of systems: Dyspnea with exertion, NYHA class 3  The patient specifically denies any chest pain at rest or with exertion, dyspnea at rest, orthopnea, paroxysmal nocturnal dyspnea, syncope, palpitations, focal neurological deficits, intermittent claudication, lower extremity edema, unexplained weight gain, cough, hemoptysis or wheezing.  The patient also denies abdominal pain, nausea, vomiting, dysphagia, diarrhea, constipation, polyuria, polydipsia, dysuria, hematuria, frequency, urgency, abnormal bleeding or bruising, fever, chills, unexpected weight changes, mood swings, change in skin or hair texture, change in voice quality, auditory or visual problems, allergic reactions or rashes. Right pretibial redness, slow healing wound and swelling as described above  PHYSICAL EXAM BP 100/52 mmHg  Pulse 80  Ht 5\' 11"  (1.803 m)  Wt 154 lb 8 oz (70.081 kg)  BMI 21.56 kg/m2 General: Alert, oriented x3, no acutedistress  Head: no evidence of trauma, PERRL, EOMI, no exophtalmos or lid lag, no myxedema, no xanthelasma; normal ears, nose and oropharynx  Neck: jugular venous pulsations are elevated 5-6 cm and there is minimal hepatojugular reflux; v waves are very prominent; brisk carotid pulses without delay and no carotid bruits  Chest: clear to  auscultation, no signs of consolidation by percussion or palpation, normal fremitus, symmetrical and full respiratory excursions. Both the old device sites and the new CRT pacemaker site appeared healthy without evidence of infection  Cardiovascular: normal position and quality of the apical impulse, regular rhythm, normal first and  paradoxically split second heart sounds, no rubs. S3 gallop present, 2 / 6 holosystolic murmur at the left lower sternal border  Abdomen: no tenderness or distention, no masses by palpation, no abnormal pulsatility or arterial bruits, normal bowel sounds, no hepatosplenomegaly  Extremities: no clubbing, cyanosis or edema; 2+ radial, ulnar and brachial pulses bilaterally; 2+ right femoral, posterior tibial and dorsalis pedis pulses; 2+ left femoral, posterior tibial and dorsalis pedis pulses; no subclavian or femoral bruits  Roughly 3 cm in diameter crusted yellowish ulceration on the left anterior shin. There is erythema and swelling in the surrounding soft tissue, slight warmth Neurological: grossly nonfocal  Lipid Panel     Component Value Date/Time   CHOL 152 12/13/2012 0950   TRIG 159* 12/13/2012 0950   HDL 28* 12/13/2012 0950   CHOLHDL 5.4 12/13/2012 0950   VLDL 32 12/13/2012 0950   LDLCALC 92 12/13/2012 0950    BMET    Component Value Date/Time   NA 134* 08/18/2014 0305   K 3.6* 08/18/2014 0305   CL 93* 08/18/2014 0305   CO2 28 08/18/2014 0305   GLUCOSE 109* 08/18/2014 0305   BUN 33* 08/18/2014 0305   CREATININE 1.16 08/18/2014 0305   CREATININE 1.40* 08/14/2014 1206   CREATININE 1.19 06/19/2012 1119   CALCIUM 8.6 08/18/2014 0305   GFRNONAA 55* 08/18/2014 0305   GFRNONAA 45* 01/08/2014 1346   GFRAA 64* 08/18/2014 0305   GFRAA 52* 01/08/2014 1346     ASSESSMENT AND PLAN Chronic combined systolic and diastolic congestive heart failure, NYHA class 3 Despite the persistent poor functional status, overall there is improvement compared to a  year ago. He has not required hospitalization for heart failure in a while. Dry weight to reestablished at 156 pounds. Blood pressure prevents additional titration of ACE inhibitor. Note gout attacks with restive diuresis in the past. Monthly [employer] downloads - enroll in ICM.  Gout On allopurinol, no recent episodes.  Biventricular cardiac pacemaker in situ Normal device function. He feels worse following the adjustment in LV RV offset. Will change back to original setting. LV lead pacing configuration is mid3 to RV ring. This configuration has a relatively high pacing threshold (1.75 V at 1.0 ms pulse width) but appears to provide optimal resynchronization.  Atrial fibrillation, permanent Appropriate warfarin anticoagulation with therapeutic levels.   CAD- CABG X 4 '95, patent grafts '09 No angina symptoms to suggest coronary insufficiency at this time, per Dr. Caryl Comes made the very good point that it may be time to reassess his coronary situation. His last catheterization was in 2009 and his last functional study in 2006. I propose repeating a nuclear stress test. I did point out that this would only make sense if he would agree to follow-up with a catheterization if indicated. He immediately responded that he is not interested in invasive procedures. His daughter, however encouraged him to consider this as an option if there is a chance that it would lead to functional improvement. They will  Think about it and let me know.  Nonhealing wound, left anterior shin Chronic edema may be part of the problem. We'll ask [facility] to evaluate and treat  Patient Instructions    Your physician recommends that you schedule a follow-up appointment in: 6 weeks with Dr.Yardley Lekas with device check.  Please expect a phone call from [name], RN regarding your enrollment in the [facility] clinic.      Orders Placed This Encounter  Procedures  . Implantable device check  . Implantable  device  check   Meds ordered this encounter  Medications  . VOLTAREN 1 % GEL    Sig: daily.    Refill:  1  . DISCONTD: ipratropium (ATROVENT) 0.03 % nasal spray    Sig: Place 2 sprays into both nostrils every 12 (twelve) hours.    Dispense:  30 mL    Refill:  [address]  [name], MD, [facility] [phone] office [phone] pager

## 2014-10-15 ENCOUNTER — Telehealth: Payer: Self-pay | Admitting: Cardiovascular Disease

## 2014-10-15 NOTE — Telephone Encounter (Signed)
Closed encounter °

## 2014-10-17 ENCOUNTER — Encounter: Payer: Self-pay | Admitting: Cardiovascular Disease

## 2014-10-20 ENCOUNTER — Telehealth: Payer: Self-pay | Admitting: Pharmacist Clinician (PhC)/ Clinical Pharmacy Specialist

## 2014-10-20 NOTE — Telephone Encounter (Signed)
Dtr called - pt was started on gabapentin 100mg  qd.  No interaction with warfarin.

## 2014-10-23 ENCOUNTER — Encounter (HOSPITAL_COMMUNITY): Payer: Self-pay | Admitting: Cardiovascular Disease

## 2014-11-04 ENCOUNTER — Encounter (HOSPITAL_BASED_OUTPATIENT_CLINIC_OR_DEPARTMENT_OTHER): Payer: Medicare Other | Attending: General Surgery

## 2014-11-04 DIAGNOSIS — I87332 Chronic venous hypertension (idiopathic) with ulcer and inflammation of left lower extremity: Secondary | ICD-10-CM | POA: Diagnosis not present

## 2014-11-04 DIAGNOSIS — L97829 Non-pressure chronic ulcer of other part of left lower leg with unspecified severity: Secondary | ICD-10-CM | POA: Diagnosis not present

## 2014-11-05 ENCOUNTER — Ambulatory Visit (INDEPENDENT_AMBULATORY_CARE_PROVIDER_SITE_OTHER): Payer: Medicare Other | Admitting: Pharmacist Clinician (PhC)/ Clinical Pharmacy Specialist

## 2014-11-05 ENCOUNTER — Other Ambulatory Visit (HOSPITAL_BASED_OUTPATIENT_CLINIC_OR_DEPARTMENT_OTHER): Payer: Self-pay | Admitting: General Surgery

## 2014-11-05 ENCOUNTER — Ambulatory Visit
Admission: RE | Admit: 2014-11-05 | Discharge: 2014-11-05 | Disposition: A | Discharge status: Home / Self Care | Payer: Medicare Other | Source: Ambulatory Visit | Attending: General Surgery | Admitting: General Surgery

## 2014-11-05 DIAGNOSIS — L98499 Non-pressure chronic ulcer of skin of other sites with unspecified severity: Secondary | ICD-10-CM

## 2014-11-05 DIAGNOSIS — Z7901 Long term (current) use of anticoagulants: Secondary | ICD-10-CM

## 2014-11-05 DIAGNOSIS — I482 Chronic atrial fibrillation: Secondary | ICD-10-CM

## 2014-11-05 DIAGNOSIS — I4821 Permanent atrial fibrillation: Secondary | ICD-10-CM

## 2014-11-05 DIAGNOSIS — I4891 Unspecified atrial fibrillation: Secondary | ICD-10-CM

## 2014-11-05 LAB — POCT INR: INR: 4.1

## 2014-11-05 NOTE — H&P (Signed)
NAME:  [name], [name]                    ACCOUNT NO.:  MEDICAL RECORD NO.:  [id number]  LOCATION:                                 FACILITY:  PHYSICIAN:  Elesa Hacker, M.D.        DATE OF BIRTH:  05/24/1926  DATE OF ADMISSION: DATE OF DISCHARGE:                             HISTORY & PHYSICAL   CHIEF COMPLAINT:  Wounds on left leg.  HISTORY OF PRESENT ILLNESS:  The patient was placed on Cipro along with his Coumadin and had a very dramatic reaction of rash on both legs.  A punch biopsy was performed resulting in a diagnosis of vasculitis. After symptoms have abated somewhat, the patient is left with a sizable wound at the site of the punch biopsy and several more superficial wounds surrounding it on the left anterior calf.  PAST MEDICAL HISTORY:  Significant for heart disease with congestive heart failure, complete heart block, now treated with pacemaker, ischemic cardiomyopathy, history of asbestos exposure, hypothyroidism, COPD, coronary bypass grafting,  chronic kidney disease, and some memory loss.  PAST SURGICAL HISTORY:  Pacemaker and coronary bypass.  SOCIAL HISTORY:  Cigarettes, none.  Alcohol, none.  MEDICATIONS:  Allopurinol, Lumigan, Symbicort, Capoten, Voltaren, Lanoxin, Colace,  Cosopt, Lasix, Synthroid, Zaroxolyn, gabapentin, Protonix, and Coumadin.  ALLERGY:  Chocolate, Cipro causes rash, and meat tenderizer causes hives.  REVIEW OF SYSTEMS:  Essentially as above.  PHYSICAL EXAMINATION:  GENERAL APPEARANCE:  Awake and alert. CHEST:  Clear. HEART:  Regular rhythm. EXTREMITIES:  Examination of lower extremity on the left reveals some stasis changes and bounding dorsalis pedis pulse.  There were several wounds anteriorly and one laterally on the left calf.  The largest wound is 2.4 x 1.9 x 0.4 in depth and has some necrotic subcutaneous tissue present.  All the wounds are somewhat tender.  I was unable to debride some of the necrotic tissue from the anterior  wound and tissue was sent for culture.  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  PLAN OF TREATMENT:  We will start with Santyl every other day and recommend elevation.     Elesa Hacker, M.D.     RA/MEDQ  D:  11/04/2014  T:  11/04/2014  Job:  [id number]  cc:   Domingo Pulse, M.D.

## 2014-11-11 DIAGNOSIS — I87332 Chronic venous hypertension (idiopathic) with ulcer and inflammation of left lower extremity: Secondary | ICD-10-CM | POA: Diagnosis not present

## 2014-11-11 DIAGNOSIS — L97829 Non-pressure chronic ulcer of other part of left lower leg with unspecified severity: Secondary | ICD-10-CM | POA: Diagnosis not present

## 2014-11-18 ENCOUNTER — Encounter (HOSPITAL_BASED_OUTPATIENT_CLINIC_OR_DEPARTMENT_OTHER): Payer: Medicare Other | Attending: General Surgery

## 2014-11-18 DIAGNOSIS — L97921 Non-pressure chronic ulcer of unspecified part of left lower leg limited to breakdown of skin: Secondary | ICD-10-CM | POA: Insufficient documentation

## 2014-11-18 DIAGNOSIS — I87332 Chronic venous hypertension (idiopathic) with ulcer and inflammation of left lower extremity: Secondary | ICD-10-CM | POA: Insufficient documentation

## 2014-11-25 DIAGNOSIS — L97921 Non-pressure chronic ulcer of unspecified part of left lower leg limited to breakdown of skin: Secondary | ICD-10-CM | POA: Diagnosis not present

## 2014-11-25 DIAGNOSIS — I87332 Chronic venous hypertension (idiopathic) with ulcer and inflammation of left lower extremity: Secondary | ICD-10-CM | POA: Diagnosis not present

## 2014-11-26 ENCOUNTER — Ambulatory Visit (INDEPENDENT_AMBULATORY_CARE_PROVIDER_SITE_OTHER): Payer: Medicare Other | Admitting: Pharmacist Clinician (PhC)/ Clinical Pharmacy Specialist

## 2014-11-26 ENCOUNTER — Ambulatory Visit (INDEPENDENT_AMBULATORY_CARE_PROVIDER_SITE_OTHER): Payer: Medicare Other | Admitting: Cardiovascular Disease

## 2014-11-26 ENCOUNTER — Encounter: Payer: Self-pay | Admitting: Cardiovascular Disease

## 2014-11-26 VITALS — BP 114/64 | HR 70 | Resp 20 | Ht 71.0 in | Wt 161.2 lb

## 2014-11-26 DIAGNOSIS — R0602 Shortness of breath: Secondary | ICD-10-CM

## 2014-11-26 DIAGNOSIS — I4821 Permanent atrial fibrillation: Secondary | ICD-10-CM

## 2014-11-26 DIAGNOSIS — Z950 Presence of cardiac pacemaker: Secondary | ICD-10-CM

## 2014-11-26 DIAGNOSIS — I442 Atrioventricular block, complete: Secondary | ICD-10-CM

## 2014-11-26 DIAGNOSIS — I482 Chronic atrial fibrillation: Secondary | ICD-10-CM

## 2014-11-26 DIAGNOSIS — I5042 Chronic combined systolic (congestive) and diastolic (congestive) heart failure: Secondary | ICD-10-CM

## 2014-11-26 DIAGNOSIS — I4891 Unspecified atrial fibrillation: Secondary | ICD-10-CM

## 2014-11-26 DIAGNOSIS — I5043 Acute on chronic combined systolic (congestive) and diastolic (congestive) heart failure: Secondary | ICD-10-CM

## 2014-11-26 DIAGNOSIS — I87312 Chronic venous hypertension (idiopathic) with ulcer of left lower extremity: Secondary | ICD-10-CM

## 2014-11-26 DIAGNOSIS — L97929 Non-pressure chronic ulcer of unspecified part of left lower leg with unspecified severity: Secondary | ICD-10-CM

## 2014-11-26 DIAGNOSIS — I83029 Varicose veins of left lower extremity with ulcer of unspecified site: Secondary | ICD-10-CM | POA: Insufficient documentation

## 2014-11-26 DIAGNOSIS — I5023 Acute on chronic systolic (congestive) heart failure: Secondary | ICD-10-CM

## 2014-11-26 DIAGNOSIS — I255 Ischemic cardiomyopathy: Secondary | ICD-10-CM

## 2014-11-26 DIAGNOSIS — Z79899 Other long term (current) drug therapy: Secondary | ICD-10-CM

## 2014-11-26 DIAGNOSIS — Z7901 Long term (current) use of anticoagulants: Secondary | ICD-10-CM

## 2014-11-26 HISTORY — DX: Atrioventricular block, complete: I44.2

## 2014-11-26 LAB — POCT INR: INR: 2

## 2014-11-26 NOTE — Progress Notes (Signed)
Patient ID: [name], male   DOB: 1926-02-05, 81 y.o.   MRN: [id number]      Reason for office visit CAD, ischemic cardiomyopathy, combined systolic and diastolic heart failure, CRT-P follow-up   [name] is doing reasonably well but seems to have a little bit of extra fluid on board. His weight is just above the previously assessed "dry weight" over 156 pounds on his home scale( which estimates his weight 5 pounds less than our office scale). His daughter has noticed that he wheezes when he walks. He has not had any problems with dizziness or lightheadedness. He continues have bilateral lower showed edema and a very slowly healing wound on his lower left leg. Gabapentin was used for lower extremity neuropathy but made him feel "fuzzy and sleepy" and has been stopped. Interrogation of his pacemaker shows that his Corvue thoracic impedance has been steadily declining since about Christmas and is frankly underneath his baseline level for a couple of weeks now.  Otherwise his pacemaker check shows normal function and 98% biventricular pacing (programmed VVIR 70-1 20). Estimated device longevity is 4&1/2-5 years.  [name] has underlying complete heart block and is pacemaker dependent. He has ischemic cardiomyopathy with a left ventricular ejection fraction around 25-30%. Had coronary bypass surgery in 1995. His cardiac catheterization in 2009 showed all his grafts to be widely patent. His most recent functional study was a nuclear stress test in 2006 that showed a very large inferior, septal and apical scar with relatively preserved perfusion in the anterior and lateral walls. There was no meaningful ischemia.  He had a biventricular device but developed pocket erosion roughly a year after generator change and had to be explanted in July 2014. He had a "temporary permanent" ventricular pacemaker placed percutaneously for a few weeks. This was complicated by deep venous thrombosis involving the left internal  jugular and subclavian vein. A new CRT pacemaker was implanted but during the interval between the 2 devices he developed hard to manage congestive heart failure. Some improvement occurred after the new biventricular device was implanted, but he seemed to be receiving less successful resynchronization therapy, likely due to the more limited pacing vectors available with CRT-P versus CRT-D. He required rehospitalization in February 2015 for acute heart failure with hyponatremia and worsening renal function. LV pacing was changed from LV3 to can with simultaneous RV and LV pacing, Reprogrammed LV3 to RV ring with LV first by 53msec. Since then his heart failure has been easier to manage, even though he still requires higher doses of diuretics than in the past. Saw Dr. Caryl Comes on November 3 and LV-RV offset was changed, replicating the settings with his original device. [name] is confident that he feels worse after that change. He becomes short of breath walking roughly 15-20 feet  He has permanent atrial fibrillation on chronic warfarin anticoagulation.  He has pulmonary fibrosis, presumed to be asbestosis.  Well treated hypertension and hyperlipidemia and compensated hypothyroidism. Allergies  Allergen Reactions  . Chocolate Flavor Hives    When eats a lot of chocolate  . Ciprofloxacin Rash    Rash   . Other Hives    Meat tenderizer    Current Outpatient Prescriptions  Medication Sig Dispense Refill  . allopurinol (ZYLOPRIM) 300 MG tablet TAKE 1 TABLET (300 MG TOTAL) BY MOUTH DAILY. 30 tablet 6  . bimatoprost (LUMIGAN) 0.03 % ophthalmic solution Place 1 drop into both eyes at bedtime.     . budesonide-formoterol (SYMBICORT) 160-4.5 MCG/ACT inhaler Inhale 2  puffs into the lungs 2 (two) times daily.    . captopril (CAPOTEN) 12.5 MG tablet TAKE 1/2 TABLET TWICE A DAY. 30 tablet 6  . digoxin (LANOXIN) 0.125 MG tablet Take 1 tablet (125 mcg total) by mouth at bedtime. 90 tablet 3  . docusate sodium  (COLACE) 100 MG capsule Take 400 mg by mouth at bedtime.    . dorzolamide-timolol (COSOPT) 22.3-6.8 MG/ML ophthalmic solution Place 1 drop into both eyes 2 (two) times daily.     Marland Kitchen FIBER PO Take 5 capsules by mouth at bedtime.    . furosemide (LASIX) 80 MG tablet Take 40-80 mg by mouth 2 (two) times daily. 80mg  in am and 40pm in pm    . Iron-Vitamin C (VITRON-C PO) Take 1 tablet by mouth 2 (two) times daily.     Marland Kitchen levothyroxine (SYNTHROID, LEVOTHROID) 125 MCG tablet Take 125 mcg by mouth daily before breakfast.    . magnesium oxide (MAG-OX) 400 MG tablet Take 400 mg by mouth daily.    . metolazone (ZAROXOLYN) 2.5 MG tablet Take 2 times a week or as needed to keep weight down to less than 160 lbs. (Patient taking differently: Take 2.5 mg by mouth. Take 2 times a week or as needed to keep weight down to less than 157 lbs.) 30 tablet 3  . Omega 3-6-9 Fatty Acids (TRIPLE OMEGA-3-6-9) CAPS Take by mouth.    . pantoprazole (PROTONIX) 40 MG tablet Take 1 tablet (40 mg total) by mouth daily. 30 tablet 11  . polyvinyl alcohol (LIQUIFILM TEARS) 1.4 % ophthalmic solution Place 1 drop into both eyes 2 (two) times daily.    . potassium chloride SA (K-DUR,KLOR-CON) 20 MEQ tablet Take 20-40 mEq by mouth 2 (two) times daily. 20meq in am and 20 meq in pm Takes 1 extra tablet when taking metolazone    . warfarin (COUMADIN) 5 MG tablet Take 2.5-5 mg by mouth daily. Takes 1 tablet (5mg ) daily except 1/2 tablet (2.5 mg) every Sat.     No current facility-administered medications for this visit.    Past Medical History  Diagnosis Date  . Ischemic cardiomyopathy     EF-35-45%by echo in 2011; first dx in the 90's but again in 2009 & rec'd 1st BiV ICD   . Paroxysmal atrial fibrillation     sinus rhythm on amiodarone  . Asbestos exposure     plaques on CT  . Compression fracture     T7  . Hypertension   . Coronary artery disease     last cath 2009, hx CABG  . Automatic implantable cardioverter-defibrillator in  situ     now explanted  . Hypothyroidism   . CHF (congestive heart failure)     hx.  . Myocardial infarction     INFERIOR [phone] wth PTCA; Ant MI 1995 wth PTCA   . Shortness of breath     "only when external pacemaker wasn't working right" (06/17/2013)  . Arthritis     "a touch all over" (06/17/2013)  . COPD (chronic obstructive pulmonary disease)     "a touch" (8/4//2014)  . Atrial fibrillation, permanent 07/22/2013  . Biventricular cardiac pacemaker in situ 06/2013    St. Jude Allura (pt no longer wished for ICD portion)  . Pacemaker     Past Surgical History  Procedure Laterality Date  . Inguinal hernia repair Right   . Coronary artery bypass graft  11/01/1994    SVG to first diagonal, to distal RCA, to the ramus intermedius  artery, and LIMA the LAD  . 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 Cataract extraction w/ intraocular lens  implant, bilateral Bilateral   . Icd lead removal Left 05/27/2013    Procedure: ICD LEAD REMOVAL;  Surgeon: Evans Lance, MD;  Location: [facility];  Service: Cardiovascular;  Laterality: Left;  . Bi-ventricular pacemaker insertion (crt-p) Right 06/17/2013    St Jude  . Pacemaker removal Left 05/2013  . Coronary angioplasty with stent placement  12/17/2001    to VG-diag wth Zeta stent  . Cardiac catheterization  2009    Patent LIMA-LAD; patent VG-diag wth mild in-stent restenosis; patent VG-ramus intemedius; patent VG-RCA wth 70% mid post lat stenosis  . Cardiac catheterization  03/16/2006    patent LIMA, patent VGs;  EF 30%  . Cardioversion  10/12/2010    successful cardioversion  . Coronary angioplasty  [id number]    LAD;Inf MI PTCA-RCA; ant MI PTCA- LAD  . Implantable cardioverter defibrillator (icd) generator change N/A 10/26/2012    Procedure: ICD  GENERATOR CHANGE;  Surgeon: [name], MD;  Location: [facility] CATH LAB;  Service: Cardiovascular;  Laterality: N/A;  . Bi-ventricular pacemaker insertion N/A 06/17/2013    Procedure: BI-VENTRICULAR PACEMAKER INSERTION (CRT-P);  Surgeon: Evans Lance, MD;  Location: [facility] C[facility] LAB;  Service: Cardiovascular;  Laterality: N/A;    Family History  Problem Relation Age of Onset  . Heart disease Father     History   Social History  . Marital Status: Widowed    Spouse Name: N/A    Number of Children: N/A  . 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 Smoking status: Never Smoker   . Smokeless tobacco: Never Used  . Alcohol Use: 8.4 oz/week    14 Glasses of wine per week     Comment: glass of wine daily  . Drug Use: No  . Sexual Activity: No   Other Topics Concern  . Not on file   Social History Narrative    Review of systems: Dyspnea with exertion, NYHA class 3  The patient specifically denies any chest pain at rest or with exertion, dyspnea at rest, orthopnea, paroxysmal nocturnal dyspnea, syncope, palpitations, focal neurological deficits, intermittent claudication, lower extremity edema, unexplained weight gain, cough, hemoptysis or wheezing.  The patient also denies abdominal pain, nausea, vomiting, dysphagia, diarrhea, constipation, polyuria, polydipsia, dysuria, hematuria, frequency, urgency, abnormal bleeding or bruising, fever, chills, unexpected weight changes, mood swings, change in skin or hair texture, change in voice quality, auditory or visual problems, allergic reactions or rashes. Right pretibial redness, slow healing wound and swelling as described above  PHYSICAL EXAM BP 114/64 mmHg  Pulse 70  Ht 5\' 11"  (1.803 m)  Wt 161 lb 3.2 oz (73.12 kg)  BMI 22.49 kg/m2 General: Alert, oriented x3, no acute distress  Head: no evidence of trauma, PERRL, EOMI, no exophtalmos or lid lag, no  myxedema, no xanthelasma; normal ears, nose and oropharynx  Neck: jugular venous pulsations are elevated 8-10 cm and there is prompt hepatojugular reflux; v waves are very prominent; brisk carotid pulses without delay and no carotid bruits  Chest: clear to auscultation, no signs of consolidation by percussion or palpation,  normal fremitus, symmetrical and full respiratory excursions. Both the old device sites and the new CRT pacemaker site appeared healthy without evidence of infection  Cardiovascular: normal position and quality of the apical impulse, regular rhythm, normal first and paradoxically split second heart sounds, no rubs. S3 gallop present, 2 / 6 holosystolic murmur at the left lower sternal border  Abdomen: no tenderness or distention, no masses by palpation, no abnormal pulsatility or arterial bruits, normal bowel sounds, no hepatosplenomegaly  Extremities: no clubbing, cyanosis or edema; 2+ radial, ulnar and brachial pulses bilaterally; 2+ right femoral, posterior tibial and dorsalis pedis pulses; 2+ left femoral, posterior tibial and dorsalis pedis pulses; no subclavian or femoral bruits  Roughly 3 cm in diameter crusted yellowish ulceration on the left anterior shin. There is erythema and swelling in the surrounding soft tissue, slight warmth Neurological: grossly nonfocal  Lipid Panel     Component Value Date/Time   CHOL 152 12/13/2012 0950   TRIG 159* 12/13/2012 0950   HDL 28* 12/13/2012 0950   CHOLHDL 5.4 12/13/2012 0950   VLDL 32 12/13/2012 0950   LDLCALC 92 12/13/2012 0950    BMET    Component Value Date/Time   NA 134* 08/18/2014 0305   K 3.6* 08/18/2014 0305   CL 93* 08/18/2014 0305   CO2 28 08/18/2014 0305   GLUCOSE 109* 08/18/2014 0305   BUN 33* 08/18/2014 0305   CREATININE 1.16 08/18/2014 0305   CREATININE 1.40* 08/14/2014 1206   CREATININE 1.19 06/19/2012 1119   CALCIUM 8.6 08/18/2014 0305   GFRNONAA 55* 08/18/2014 0305   GFRNONAA 45* 01/08/2014 1346    GFRAA 64* 08/18/2014 0305   GFRAA 52* 01/08/2014 1346     ASSESSMENT AND PLAN Chronic combined systolic and diastolic congestive heart failure, NYHA class 3  seems to be fluid overloaded now, try to shoot for a dry weight of 153 pounds on his home scale but do so very slowly since his blood pressure so low. He will take metolazone to achieve this but not more frequently than every other day and not more than 3 days a week. Check lab tests in about a week's time. He will need early follow-up. Blood pressure prevents additional titration of ACE inhibitor.  Note gout attacks with  aggressivediuresis in the past. Monthly Corvue downloads - enroll in ICM.  Gout On allopurinol, no recent episodes.  Biventricular cardiac pacemaker in situ Normal device function.  Changed back to previous LV lead pacing configuration (mid3 to RV ring). This configuration has a relatively high pacing threshold (1.75 V at 1.0 ms pulse width) but appears to provide optimal resynchronization.  Atrial fibrillation, permanent Appropriate warfarin anticoagulation with therapeutic levels.   CAD- CABG X 4 '95, patent grafts '09 No angina symptoms to suggest coronary insufficiency at this time, per Dr. Caryl Comes made the very good point that it may be time to reassess his coronary situation. His last catheterization was in 2009 and his last functional study in 2006.  [name] does not appear to be interested in any invasive procedures , so we decided not to pursue this at this point in time.  Nonhealing wound, left anterior shin Chronic edema is part of the problem.  Now being seen in the wound clinic , slowly improving   Orders Placed This Encounter  Procedures  . Basic metabolic panel  . Brain natriuretic peptide   No orders of the defined types were placed in this encounter.    [name]  [name], MD, [facility] [phone] office [phone] pager

## 2014-11-26 NOTE — Patient Instructions (Addendum)
Your new home DRY WEIGHT is 153 lbs.  Take the Metolazone 2.5mg  to maintain this weight but no more than 3 times per week.    Your physician recommends that you return for lab work in: one week.  Dr. Sallyanne Kuster recommends that you schedule a follow-up appointment in: 6 weeks.

## 2014-11-28 LAB — MDC_IDC_ENUM_SESS_TYPE_INCLINIC
Battery Remaining Percentage: 95 %
Battery Voltage: 2.96 V
Brady Statistic RV Percent Paced: 98 %
Implantable Pulse Generator Serial Number: 7515957
Lead Channel Impedance Value: 440 Ohm
Lead Channel Impedance Value: 550 Ohm
Lead Channel Pacing Threshold Amplitude: 1 V
Lead Channel Pacing Threshold Amplitude: 1.75 V
Lead Channel Pacing Threshold Pulse Width: 1 ms
Lead Channel Setting Pacing Amplitude: 2.75 V
Lead Channel Setting Pacing Pulse Width: 0.4 ms
Lead Channel Setting Pacing Pulse Width: 1 ms
Lead Channel Setting Sensing Sensitivity: 6 mV
MDC IDC MSMT LEADCHNL RV PACING THRESHOLD PULSEWIDTH: 0.4 ms
MDC IDC PG MODEL: 3242
MDC IDC SET LEADCHNL RV PACING AMPLITUDE: 2.5 V

## 2014-12-02 DIAGNOSIS — L97921 Non-pressure chronic ulcer of unspecified part of left lower leg limited to breakdown of skin: Secondary | ICD-10-CM | POA: Diagnosis not present

## 2014-12-02 DIAGNOSIS — I87332 Chronic venous hypertension (idiopathic) with ulcer and inflammation of left lower extremity: Secondary | ICD-10-CM | POA: Diagnosis not present

## 2014-12-03 ENCOUNTER — Encounter: Payer: Self-pay | Admitting: Cardiovascular Disease

## 2014-12-05 LAB — BASIC METABOLIC PANEL
BUN: 45 mg/dL — ABNORMAL HIGH (ref 6–23)
CO2: 32 mEq/L (ref 19–32)
CREATININE: 1.38 mg/dL — AB (ref 0.50–1.35)
Calcium: 9.2 mg/dL (ref 8.4–10.5)
Chloride: 94 mEq/L — ABNORMAL LOW (ref 96–112)
Glucose, Bld: 93 mg/dL (ref 70–99)
Potassium: 3.7 mEq/L (ref 3.5–5.3)
SODIUM: 138 meq/L (ref 135–145)

## 2014-12-05 LAB — BRAIN NATRIURETIC PEPTIDE: Brain Natriuretic Peptide: 398.8 pg/mL — ABNORMAL HIGH (ref 0.0–100.0)

## 2014-12-09 DIAGNOSIS — I87332 Chronic venous hypertension (idiopathic) with ulcer and inflammation of left lower extremity: Secondary | ICD-10-CM | POA: Diagnosis not present

## 2014-12-09 DIAGNOSIS — L97921 Non-pressure chronic ulcer of unspecified part of left lower leg limited to breakdown of skin: Secondary | ICD-10-CM | POA: Diagnosis not present

## 2014-12-10 ENCOUNTER — Ambulatory Visit (INDEPENDENT_AMBULATORY_CARE_PROVIDER_SITE_OTHER)
Admission: RE | Admit: 2014-12-10 | Discharge: 2014-12-10 | Disposition: A | Discharge status: Home / Self Care | Payer: Medicare Other | Source: Ambulatory Visit | Attending: Vascular Surgery | Admitting: Vascular Surgery

## 2014-12-10 ENCOUNTER — Other Ambulatory Visit (HOSPITAL_BASED_OUTPATIENT_CLINIC_OR_DEPARTMENT_OTHER): Payer: Self-pay | Admitting: General Surgery

## 2014-12-10 ENCOUNTER — Ambulatory Visit (HOSPITAL_COMMUNITY)
Admission: RE | Admit: 2014-12-10 | Discharge: 2014-12-10 | Disposition: A | Discharge status: Home / Self Care | Payer: Medicare Other | Source: Ambulatory Visit | Attending: Vascular Surgery | Admitting: Vascular Surgery

## 2014-12-10 DIAGNOSIS — L97929 Non-pressure chronic ulcer of unspecified part of left lower leg with unspecified severity: Secondary | ICD-10-CM

## 2014-12-10 DIAGNOSIS — I739 Peripheral vascular disease, unspecified: Secondary | ICD-10-CM

## 2014-12-10 DIAGNOSIS — I878 Other specified disorders of veins: Secondary | ICD-10-CM

## 2014-12-16 ENCOUNTER — Encounter (HOSPITAL_BASED_OUTPATIENT_CLINIC_OR_DEPARTMENT_OTHER): Payer: Medicare Other | Attending: General Surgery

## 2014-12-16 DIAGNOSIS — L97821 Non-pressure chronic ulcer of other part of left lower leg limited to breakdown of skin: Secondary | ICD-10-CM | POA: Insufficient documentation

## 2014-12-16 DIAGNOSIS — I87332 Chronic venous hypertension (idiopathic) with ulcer and inflammation of left lower extremity: Secondary | ICD-10-CM | POA: Insufficient documentation

## 2014-12-23 DIAGNOSIS — I87332 Chronic venous hypertension (idiopathic) with ulcer and inflammation of left lower extremity: Secondary | ICD-10-CM | POA: Diagnosis not present

## 2014-12-23 DIAGNOSIS — L97821 Non-pressure chronic ulcer of other part of left lower leg limited to breakdown of skin: Secondary | ICD-10-CM | POA: Diagnosis not present

## 2014-12-24 ENCOUNTER — Ambulatory Visit (INDEPENDENT_AMBULATORY_CARE_PROVIDER_SITE_OTHER): Payer: Medicare Other | Admitting: Pharmacist Clinician (PhC)/ Clinical Pharmacy Specialist

## 2014-12-24 DIAGNOSIS — I4891 Unspecified atrial fibrillation: Secondary | ICD-10-CM | POA: Diagnosis not present

## 2014-12-24 DIAGNOSIS — I482 Chronic atrial fibrillation: Secondary | ICD-10-CM

## 2014-12-24 DIAGNOSIS — Z7901 Long term (current) use of anticoagulants: Secondary | ICD-10-CM | POA: Diagnosis not present

## 2014-12-24 DIAGNOSIS — I4821 Permanent atrial fibrillation: Secondary | ICD-10-CM

## 2014-12-24 LAB — POCT INR: INR: 2.5

## 2014-12-29 ENCOUNTER — Telehealth: Payer: Self-pay | Admitting: Cardiovascular Disease

## 2014-12-29 ENCOUNTER — Ambulatory Visit (INDEPENDENT_AMBULATORY_CARE_PROVIDER_SITE_OTHER): Payer: Medicare Other | Admitting: *Deleted

## 2014-12-29 DIAGNOSIS — I5042 Chronic combined systolic (congestive) and diastolic (congestive) heart failure: Secondary | ICD-10-CM

## 2014-12-29 NOTE — Telephone Encounter (Signed)
LMOVM for pt daughter to return call.  

## 2014-12-29 NOTE — Telephone Encounter (Signed)
New Msg         Pt daughter calling states father is having device checked today and she would like to discuss this.    Please return daughter Ann's call.

## 2014-12-30 ENCOUNTER — Encounter: Payer: Self-pay | Admitting: *Deleted

## 2014-12-30 ENCOUNTER — Telehealth: Payer: Self-pay | Admitting: *Deleted

## 2014-12-30 DIAGNOSIS — I87332 Chronic venous hypertension (idiopathic) with ulcer and inflammation of left lower extremity: Secondary | ICD-10-CM | POA: Diagnosis not present

## 2014-12-30 DIAGNOSIS — L97821 Non-pressure chronic ulcer of other part of left lower leg limited to breakdown of skin: Secondary | ICD-10-CM | POA: Diagnosis not present

## 2014-12-30 NOTE — Telephone Encounter (Signed)
ICM transmission received. I left a message for the patient's daughter, [name], to call.

## 2014-12-30 NOTE — Progress Notes (Signed)
EPIC Encounter for ICM Monitoring  Patient Name: [name] is a 81 y.o. male Date: 12/30/2014 Primary Care Physican: [name],[name] Primary Cardiologist: [name] Electrophysiologist: [name] Dry Weight: 153 lbs  Bi-V pacing: 98%       In the past month, have you:  1. Gained more than 2 pounds in a day or more than 5 pounds in a week? No. I spoke with the patient's daughter. She gave me a list of the patient's weights and when they gave him metolazone since 12/15/14. 2/1- 151.4 lbs/ 2/2- 151.0 lbs/ 2/3- 152.8 lbs/ 2/4- 154.0 lbs (metolazone given)/ 2/5- 153.2 lbs/ 2/6- 154.4 lbs (metolazone given)/ 2/7- 151.8 lbs/ 2/8- 150.6 lbs/ 2/9- 153 lbs (metolazone given)/ 2/10- 152.0 lbs/ 2/11- 152.2 lbs/ 2/12- 154.4 lbs (metolazone given)/ 2/13- 153.6 lbs/ 2/14- 154 lbs (metolazone given)/ 2/15- 151.2 lbs/ 2/16- 152.2 lbs  2. Had changes in your medications (with verification of current medications)? Yes. The patient saw his PCP Dr. [name] and labs were done on 2/1- K+ was 3.3. Dr. [name] increased his K-dur to 40 meq BID.  3. Had more shortness of breath than is usual for you? No. Occasionally the patient's daughter will hear him audibly wheezing.   4. Limited your activity because of shortness of breath? no  5. Not been able to sleep because of shortness of breath? no  6. Had increased swelling in your feet or ankles? no  7. Had symptoms of dehydration (dizziness, dry mouth, increased thirst, decreased urine output) no  8. Had changes in sodium restriction? Yes. At times the patient and his "companion" will go out to eat. On 2/4 the patient's daughter states his friend took him to [facility] and he ate chili and a baked potato. On 2/5 they ate at [employer].   9. Been compliant with medication? Yes   ICM trend:   Follow-up plan: ICM clinic phone appointment: 02/09/15. I spoke with the patient's daughter, [name], today. They have been dosing the patient's metolazone as documented above with  his weights. The patient last saw Dr. Sallyanne Kuster on 11/26/14. His corvue readings were elevated from ~ 12/22-1/12. Since that time, his baseline impedence has dropped, but is stable for the patient. The patient's daughter is trying to monitor his weights and fluid/ sodium intake very closely. They have tried to limit his sodium to 1500 mg daily. They also try to a lot what sounds to be about 2 L of fluid for the patient. I have advised her to continue with his current medications at present, but I will forward to Dr. Sallyanne Kuster for review. The patient is scheduled to follow up on 2/25 with Dr. Sallyanne Kuster. I advised I will call back with any recommendations in the interim.   Copy of note sent to patient's primary care physician, primary cardiologist, and device following physician.  [name], RN, BSN 12/30/2014 2:38 PM

## 2014-12-30 NOTE — Progress Notes (Signed)
Thanks. Will stay on same regimen and review at upcoming appt.

## 2014-12-30 NOTE — Telephone Encounter (Signed)
Pt daughter wanted to inform office that pt may not give correct answers during phone conversation w/ ICM clinic nurse. She wants to be the contact for those phone calls.

## 2014-12-31 ENCOUNTER — Telehealth: Payer: Self-pay | Admitting: Pharmacist Clinician (PhC)/ Clinical Pharmacy Specialist

## 2014-12-31 NOTE — Telephone Encounter (Signed)
Late entry- I spoke with the patient yesterday.

## 2014-12-31 NOTE — Telephone Encounter (Signed)
Pt called, had nosebleed last night, blew out clot then had free bleeding for several minutes.  Stopped without incident.    Returned call, advised to make no changes to warfarin at this time.  Pt to be cautious about blowing nose in next few days.  Pt to call if problem persists.

## 2015-01-02 ENCOUNTER — Other Ambulatory Visit: Payer: Self-pay | Admitting: Cardiology

## 2015-01-02 NOTE — Telephone Encounter (Signed)
Rx(s) sent to pharmacy electronically.  

## 2015-01-08 ENCOUNTER — Ambulatory Visit (INDEPENDENT_AMBULATORY_CARE_PROVIDER_SITE_OTHER): Payer: Medicare Other | Admitting: Cardiovascular Disease

## 2015-01-08 ENCOUNTER — Ambulatory Visit (INDEPENDENT_AMBULATORY_CARE_PROVIDER_SITE_OTHER): Payer: Medicare Other | Admitting: *Deleted

## 2015-01-08 ENCOUNTER — Encounter: Payer: Self-pay | Admitting: Cardiovascular Disease

## 2015-01-08 VITALS — BP 96/54 | HR 62 | Ht 71.0 in | Wt 156.5 lb

## 2015-01-08 DIAGNOSIS — I4891 Unspecified atrial fibrillation: Secondary | ICD-10-CM

## 2015-01-08 DIAGNOSIS — I442 Atrioventricular block, complete: Secondary | ICD-10-CM

## 2015-01-08 DIAGNOSIS — I5043 Acute on chronic combined systolic (congestive) and diastolic (congestive) heart failure: Secondary | ICD-10-CM | POA: Diagnosis not present

## 2015-01-08 DIAGNOSIS — I5042 Chronic combined systolic (congestive) and diastolic (congestive) heart failure: Secondary | ICD-10-CM

## 2015-01-08 DIAGNOSIS — I2510 Atherosclerotic heart disease of native coronary artery without angina pectoris: Secondary | ICD-10-CM

## 2015-01-08 DIAGNOSIS — I482 Chronic atrial fibrillation: Secondary | ICD-10-CM

## 2015-01-08 DIAGNOSIS — Z7901 Long term (current) use of anticoagulants: Secondary | ICD-10-CM

## 2015-01-08 DIAGNOSIS — I4821 Permanent atrial fibrillation: Secondary | ICD-10-CM

## 2015-01-08 DIAGNOSIS — I5023 Acute on chronic systolic (congestive) heart failure: Secondary | ICD-10-CM

## 2015-01-08 DIAGNOSIS — Z79899 Other long term (current) drug therapy: Secondary | ICD-10-CM

## 2015-01-08 DIAGNOSIS — I255 Ischemic cardiomyopathy: Secondary | ICD-10-CM | POA: Diagnosis not present

## 2015-01-08 DIAGNOSIS — Z950 Presence of cardiac pacemaker: Secondary | ICD-10-CM

## 2015-01-08 LAB — POCT INR: INR: 2.5

## 2015-01-08 MED ORDER — BUDESONIDE-FORMOTEROL FUMARATE 160-4.5 MCG/ACT IN AERO: 2.0000 | INHALATION_SPRAY | Freq: Two times a day (BID) | RESPIRATORY_TRACT | Status: AC

## 2015-01-08 NOTE — Patient Instructions (Signed)
Dr.[name] recommends that you schedule a follow-up appointment in: 6 weeks.

## 2015-01-09 ENCOUNTER — Telehealth: Payer: Self-pay | Admitting: *Deleted

## 2015-01-09 DIAGNOSIS — Z79899 Other long term (current) drug therapy: Secondary | ICD-10-CM

## 2015-01-09 LAB — MDC_IDC_ENUM_SESS_TYPE_INCLINIC
Battery Voltage: 2.96 V
Implantable Pulse Generator Model: 3242
Lead Channel Impedance Value: 450 Ohm
Lead Channel Impedance Value: 590 Ohm
Lead Channel Pacing Threshold Amplitude: 1.5 V
Lead Channel Pacing Threshold Pulse Width: 0.4 ms
Lead Channel Setting Pacing Amplitude: 2.5 V
Lead Channel Setting Pacing Amplitude: 2.75 V
Lead Channel Setting Pacing Pulse Width: 1 ms
MDC IDC MSMT LEADCHNL LV PACING THRESHOLD PULSEWIDTH: 1 ms
MDC IDC MSMT LEADCHNL RV PACING THRESHOLD AMPLITUDE: 1 V
MDC IDC PG SERIAL: 7515957
MDC IDC SET LEADCHNL RV PACING PULSEWIDTH: 0.4 ms
MDC IDC SET LEADCHNL RV SENSING SENSITIVITY: 6 mV
MDC IDC STAT BRADY RV PERCENT PACED: 98 %

## 2015-01-09 LAB — BASIC METABOLIC PANEL
BUN: 55 mg/dL — ABNORMAL HIGH (ref 6–23)
CO2: 30 mEq/L (ref 19–32)
Calcium: 8.8 mg/dL (ref 8.4–10.5)
Chloride: 91 mEq/L — ABNORMAL LOW (ref 96–112)
Creat: 1.15 mg/dL (ref 0.50–1.35)
Glucose, Bld: 104 mg/dL — ABNORMAL HIGH (ref 70–99)
POTASSIUM: 3.2 meq/L — AB (ref 3.5–5.3)
SODIUM: 133 meq/L — AB (ref 135–145)

## 2015-01-09 NOTE — Telephone Encounter (Signed)
Ann called back and she is not positive what strength his potassium is.  She will not see him today but will have him take two additional capsules today and when she is at his home tomorrow she will verify the strength and then make sure he gets an additional 51meq daily.  Told to continue current dose and add an additional 39meq daily and have lab rechecked Tuesday or Wednesday next week.  Asked that she call Monday and verify capsule strength and Dr. Loletha Grayer will adjust accordingly after labs are rechecked.  Ann voiced understanding.

## 2015-01-11 NOTE — Progress Notes (Signed)
Patient ID: [name], male   DOB: 12-Apr-1926, 81 y.o.   MRN: [id number]      Reason for office visit CHF, CRT-P, CAD  [name] continues to have NYHA class III heart failure symptoms and appears fragile. His leg wounds are healing very slowly. He had wheezing and sleepiness at a weight of 154 lb. He has a very narrow margin of compensation between hypotension and heart failure.  Interrogation of his CRT pacemaker shows that his Corvue (thoracic impedance) is now "normal". There is 98% BiV pacing on a background of AF and heart block. His JVP is still elevated today at 6-7 cm above the sternal angle.  [name] has underlying complete heart block and is pacemaker dependent. He has ischemic cardiomyopathy with a left ventricular ejection fraction around 25-30%. Had coronary bypass surgery in 1995. His cardiac catheterization in 2009 showed all his grafts to be widely patent. His most recent functional study was a nuclear stress test in 2006 that showed a very large inferior, septal and apical scar with relatively preserved perfusion in the anterior and lateral walls. There was no meaningful ischemia.  He had a biventricular device but developed pocket erosion roughly a year after generator change and had to be explanted in July 2014. He had a "temporary permanent" ventricular pacemaker placed percutaneously for a few weeks. This was complicated by deep venous thrombosis involving the left internal jugular and subclavian vein. A new CRT pacemaker was implanted but during the interval between the 2 devices he developed hard to manage congestive heart failure. Some improvement occurred after the new biventricular device was implanted, but he seemed to be receiving less successful resynchronization therapy, likely due to the more limited pacing vectors available with CRT-P versus CRT-D. He required rehospitalization in February 2015 for acute heart failure with hyponatremia and worsening renal function. LV  pacing was changed from LV3 to can with simultaneous RV and LV pacing, Reprogrammed LV3 to RV ring with LV first by 75msec. Since then his heart failure has been easier to manage, even though he still requires higher doses of diuretics than in the past. He has permanent atrial fibrillation on chronic warfarin anticoagulation.  He has pulmonary fibrosis, presumed to be asbestosis.  Well treated hypertension and hyperlipidemia and compensated hypothyroidism.  Allergies  Allergen Reactions  . Chocolate Flavor Hives    When eats a lot of chocolate  . Ciprofloxacin Rash    Rash   . Other Hives    Meat tenderizer    Current Outpatient Prescriptions  Medication Sig Dispense Refill  . allopurinol (ZYLOPRIM) 300 MG tablet TAKE 1 TABLET (300 MG TOTAL) BY MOUTH DAILY. 30 tablet 6  . bimatoprost (LUMIGAN) 0.03 % ophthalmic solution Place 1 drop into both eyes at bedtime.     . budesonide-formoterol (SYMBICORT) 160-4.5 MCG/ACT inhaler Inhale 2 puffs into the lungs 2 (two) times daily. 1 Inhaler 5  . captopril (CAPOTEN) 12.5 MG tablet TAKE 1/2 TABLET TWICE A DAY. 30 tablet 6  . digoxin (LANOXIN) 0.125 MG tablet Take 1 tablet (125 mcg total) by mouth at bedtime. 90 tablet 3  . docusate sodium (COLACE) 100 MG capsule Take 400 mg by mouth at bedtime.    . dorzolamide-timolol (COSOPT) 22.3-6.8 MG/ML ophthalmic solution Place 1 drop into both eyes 2 (two) times daily.     Marland Kitchen FIBER PO Take 5 capsules by mouth at bedtime.    . furosemide (LASIX) 80 MG tablet Take 40-80 mg by mouth 2 (two) times daily.  80mg  in am and 40pm in pm    . Iron-Vitamin C (VITRON-C PO) Take 1 tablet by mouth 2 (two) times daily.     Marland Kitchen levothyroxine (SYNTHROID, LEVOTHROID) 125 MCG tablet Take 125 mcg by mouth daily before breakfast.    . magnesium oxide (MAG-OX) 400 MG tablet Take 400 mg by mouth daily.    . metolazone (ZAROXOLYN) 2.5 MG tablet Take 2 times a week or as needed to keep weight down to less than 160 lbs. (Patient  taking differently: Take 2.5 mg by mouth. Take 2 times a week or as needed to keep weight down to less than 157 lbs.) 30 tablet 3  . Omega 3-6-9 Fatty Acids (TRIPLE OMEGA-3-6-9) CAPS Take by mouth.    . pantoprazole (PROTONIX) 40 MG tablet Take 1 tablet (40 mg total) by mouth daily. 30 tablet 11  . polyvinyl alcohol (LIQUIFILM TEARS) 1.4 % ophthalmic solution Place 1 drop into both eyes 2 (two) times daily.    . potassium chloride SA (K-DUR,KLOR-CON) 20 MEQ tablet Take 20-40 mEq by mouth 2 (two) times daily. 41meq in am and 40 meq in pm Takes 1 extra tablet when taking metolazone    . warfarin (COUMADIN) 5 MG tablet Take 2.5-5 mg by mouth daily. Takes 1 tablet (5mg ) daily except 1/2 tablet (2.5 mg) every Sat.     No current facility-administered medications for this visit.    Past Medical History  Diagnosis Date  . Ischemic cardiomyopathy     EF-35-45%by echo in 2011; first dx in the 90's but again in 2009 & rec'd 1st BiV ICD   . Paroxysmal atrial fibrillation     sinus rhythm on amiodarone  . Asbestos exposure     plaques on CT  . Compression fracture     T7  . Hypertension   . Coronary artery disease     last cath 2009, hx CABG  . Automatic implantable cardioverter-defibrillator in situ     now explanted  . Hypothyroidism   . CHF (congestive heart failure)     hx.  . Myocardial infarction     INFERIOR [phone] wth PTCA; Ant MI 1995 wth PTCA   . Shortness of breath     "only when external pacemaker wasn't working right" (06/17/2013)  . Arthritis     "a touch all over" (06/17/2013)  . COPD (chronic obstructive pulmonary disease)     "a touch" (8/4//2014)  . Atrial fibrillation, permanent 07/22/2013  . Biventricular cardiac pacemaker in situ 06/2013    St. Jude Allura (pt no longer wished for ICD portion)  . Pacemaker   . CHB (complete heart block) 11/26/2014    Past Surgical History  Procedure Laterality Date  . Inguinal hernia repair Right   . Coronary artery bypass graft   11/01/1994    SVG to first diagonal, to distal RCA, to the ramus intermedius artery, and LIMA the LAD  . 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 Cataract extraction w/ intraocular lens  implant, bilateral Bilateral   . Icd lead removal Left 05/27/2013    Procedure: ICD LEAD REMOVAL;  Surgeon: Evans Lance, MD;  Location: [facility];  Service: Cardiovascular;  Laterality: Left;  . Bi-ventricular pacemaker insertion (crt-p) Right 06/17/2013    St Jude  .  Pacemaker removal Left 05/2013  . Coronary angioplasty with stent placement  12/17/2001    to VG-diag wth Zeta stent  . Cardiac catheterization  2009    Patent LIMA-LAD; patent VG-diag wth mild in-stent restenosis; patent VG-ramus intemedius; patent VG-RCA wth 70% mid post lat stenosis  . Cardiac catheterization  03/16/2006    patent LIMA, patent VGs;  EF 30%  . Cardioversion  10/12/2010    successful cardioversion  . Coronary angioplasty  [phone]    LAD;Inf MI PTCA-RCA; ant MI PTCA- LAD  . Implantable cardioverter defibrillator (icd) generator change N/A 10/26/2012    Procedure: ICD GENERATOR CHANGE;  Surgeon: [name], MD;  Location: [facility] CATH LAB;  Service: Cardiovascular;  Laterality: N/A;  . Bi-ventricular pacemaker insertion N/A 06/17/2013    Procedure: BI-VENTRICULAR PACEMAKER INSERTION (CRT-P);  Surgeon: Evans Lance, MD;  Location: [facility] CATH LAB;  Service: Cardiovascular;  Laterality: N/A;    Family History  Problem Relation Age of Onset  . Heart disease Father     History   Social History  . Marital Status: Widowed    Spouse Name: N/A  . Number of Children: N/A  . 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 Smoking status: Never Smoker   .  Smokeless tobacco: Never Used  . Alcohol Use: 8.4 oz/week    14 Glasses of wine per week     Comment: glass of wine daily  . Drug Use: No  . Sexual Activity: No   Other Topics Concern  . Not on file   Social History Narrative    Review of systems: Dyspnea with exertion, NYHA class 3  The patient specifically denies any chest pain at rest or with exertion, dyspnea at rest, orthopnea, paroxysmal nocturnal dyspnea, syncope, palpitations, focal neurological deficits, intermittent claudication, lower extremity edema, unexplained weight gain, cough, hemoptysis or wheezing.  The patient also denies abdominal pain, nausea, vomiting, dysphagia, diarrhea, constipation, polyuria, polydipsia, dysuria, hematuria, frequency, urgency, abnormal bleeding or bruising, fever, chills, unexpected weight changes, mood swings, change in skin or hair texture, change in voice quality, auditory or visual problems, allergic reactions or rashes. Right pretibial redness, slow healing wound and swelling as described above  PHYSICAL EXAM BP 96/54 mmHg  Pulse 62  Ht 5\' 11"  (1.803 m)  Wt 156 lb 8 oz (70.988 kg)  BMI 21.84 kg/m2 General: Alert, oriented x3, no acute distress  Head: no evidence of trauma, PERRL, EOMI, no exophtalmos or lid lag, no myxedema, no xanthelasma; normal ears, nose and oropharynx  Neck: jugular venous pulsations are elevated 8-10 cm and there is prompt hepatojugular reflux; v waves are very prominent; brisk carotid pulses without delay and no carotid bruits  Chest: clear to auscultation, no signs of consolidation by percussion or palpation, normal fremitus, symmetrical and full respiratory excursions. Both the old device sites and the new CRT pacemaker site appeared healthy without evidence of infection  Cardiovascular: normal position and quality of the apical impulse, regular rhythm, normal first and paradoxically split second heart sounds, no rubs. S3 gallop present, 2 / 6 holosystolic  murmur at the left lower sternal border  Abdomen: no tenderness or distention, no masses by palpation, no abnormal pulsatility or arterial bruits, normal bowel sounds, no hepatosplenomegaly  Extremities: no clubbing, cyanosis or edema; 2+ radial, ulnar and brachial pulses bilaterally; 2+ right femoral, posterior tibial and dorsalis pedis pulses; 2+ left femoral, posterior tibial  and dorsalis pedis pulses; no subclavian or femoral bruits  Roughly 3 cm in diameter crusted yellowish ulceration on the left anterior shin. There is erythema and swelling in the surrounding soft tissue, slight warmth Neurological: grossly nonfocal    Lipid Panel     Component Value Date/Time   CHOL 152 12/13/2012 0950   TRIG 159* 12/13/2012 0950   HDL 28* 12/13/2012 0950   CHOLHDL 5.4 12/13/2012 0950   VLDL 32 12/13/2012 0950   LDLCALC 92 12/13/2012 0950    BMET    Component Value Date/Time   NA 133* 01/08/2015 1640   K 3.2* 01/08/2015 1640   CL 91* 01/08/2015 1640   CO2 30 01/08/2015 1640   GLUCOSE 104* 01/08/2015 1640   BUN 55* 01/08/2015 1640   CREATININE 1.15 01/08/2015 1640   CREATININE 1.16 08/18/2014 0305   CREATININE 1.19 06/19/2012 1119   CALCIUM 8.8 01/08/2015 1640   GFRNONAA 55* 08/18/2014 0305   GFRNONAA 45* 01/08/2014 1346   GFRAA 64* 08/18/2014 0305   GFRAA 52* 01/08/2014 1346     ASSESSMENT AND PLAN Chronic combined systolic and diastolic congestive heart failure, NYHA class 3 Seems to be minimally fluid overloaded now, but narrow margin of compensation. Continue to try to shoot for a dry weight of under 153 pounds. He will take metolazone to achieve this but not more frequently than every other day and not more than 3 days a week. Check lab tests in about a week's time. He will need early follow-up. Blood pressure prevents additional titration of ACE inhibitor.  Note gout attacks with aggressive diuresis in the past. Monthly Corvue downloads - enrolled in ICM.  Gout On  allopurinol, no recent episodes.  Biventricular cardiac pacemaker in situ Normal device function. Changed back to previous LV lead pacing configuration (mid3 to RV ring). This configuration has a relatively high pacing threshold (1.75 V at 1.0 ms pulse width) but appears to provide optimal resynchronization.  Atrial fibrillation, permanent Appropriate warfarin anticoagulation with therapeutic levels.   CAD- CABG X 4 '95, patent grafts '09 No angina symptoms to suggest coronary insufficiency at this time, but Dr. Caryl Comes made the very good point that it may be time to reassess his coronary situation. His last catheterization was in 2009 and his last functional study in 2006. [name] does not appear to be interested in any invasive procedures, so we decided not to pursueat this point in time.  Nonhealing wound, left anterior shin Chronic edema is part of the problem.Now being seen in the wound clinic , slowly improving Orders Placed This Encounter  Procedures  . Basic metabolic panel   Meds ordered this encounter  Medications  . budesonide-formoterol (SYMBICORT) 160-4.5 MCG/ACT inhaler    Sig: Inhale 2 puffs into the lungs 2 (two) times daily.    Dispense:  1 Inhaler    Refill:  [place] Analiz Tvedt, MD, [facility] [phone] office [phone] pager

## 2015-01-12 ENCOUNTER — Encounter: Payer: Self-pay | Admitting: *Deleted

## 2015-01-12 ENCOUNTER — Telehealth: Payer: Self-pay | Admitting: *Deleted

## 2015-01-12 ENCOUNTER — Telehealth: Payer: Self-pay | Admitting: Cardiovascular Disease

## 2015-01-12 MED ORDER — POTASSIUM CHLORIDE CRYS ER 10 MEQ PO TBCR: EXTENDED_RELEASE_TABLET | ORAL | Status: DC

## 2015-01-12 MED ORDER — POTASSIUM CHLORIDE CRYS ER 20 MEQ PO TBCR: 20.0000 meq | EXTENDED_RELEASE_TABLET | Freq: Two times a day (BID) | ORAL | Status: DC

## 2015-01-12 NOTE — Telephone Encounter (Signed)
Pt's daughter was calling in to speak with Pamala Hurry in reference to the pt's potassium medication and she had a question about him taking the extra strength rapid release tyenolol, she says that the medication contains croscarmellose sodium and would like to know if this is acting as his regular sodium intake. Please f/u  Thanks

## 2015-01-12 NOTE — Telephone Encounter (Signed)
-----   Message from [name], MD sent at 01/09/2015 11:12 AM EST ----- K 3.2.  Please take an additional 40 mEq of potassium chloride every day (above and beyond what he is already taking) and recheck basic metabolic panel middle of next week.

## 2015-01-12 NOTE — Telephone Encounter (Signed)
Reviewed instructions w/ caller. She had question about salt content of tylenol, concern for sodium intake due to diet. Addressed that the additional sodium in medication does contribute to daily intake, amount probably varies dependent on pill type. Other question regarding potassium tablets addressed, rx refilled for current strength.

## 2015-01-13 ENCOUNTER — Telehealth: Payer: Self-pay | Admitting: Cardiovascular Disease

## 2015-01-13 ENCOUNTER — Other Ambulatory Visit: Payer: Self-pay | Admitting: *Deleted

## 2015-01-13 ENCOUNTER — Encounter (HOSPITAL_BASED_OUTPATIENT_CLINIC_OR_DEPARTMENT_OTHER): Payer: Medicare Other | Attending: General Surgery

## 2015-01-13 DIAGNOSIS — Z79899 Other long term (current) drug therapy: Secondary | ICD-10-CM

## 2015-01-13 DIAGNOSIS — Y839 Surgical procedure, unspecified as the cause of abnormal reaction of the patient, or of later complication, without mention of misadventure at the time of the procedure: Secondary | ICD-10-CM | POA: Insufficient documentation

## 2015-01-13 DIAGNOSIS — S81802D Unspecified open wound, left lower leg, subsequent encounter: Secondary | ICD-10-CM | POA: Diagnosis not present

## 2015-01-13 DIAGNOSIS — L97821 Non-pressure chronic ulcer of other part of left lower leg limited to breakdown of skin: Secondary | ICD-10-CM | POA: Insufficient documentation

## 2015-01-13 DIAGNOSIS — I87332 Chronic venous hypertension (idiopathic) with ulcer and inflammation of left lower extremity: Secondary | ICD-10-CM | POA: Insufficient documentation

## 2015-01-13 DIAGNOSIS — T8189XD Other complications of procedures, not elsewhere classified, subsequent encounter: Secondary | ICD-10-CM | POA: Insufficient documentation

## 2015-01-13 LAB — BASIC METABOLIC PANEL
BUN: 42 mg/dL — ABNORMAL HIGH (ref 6–23)
CALCIUM: 8.8 mg/dL (ref 8.4–10.5)
CO2: 30 meq/L (ref 19–32)
Chloride: 94 mEq/L — ABNORMAL LOW (ref 96–112)
Creat: 1.19 mg/dL (ref 0.50–1.35)
GLUCOSE: 86 mg/dL (ref 70–99)
Potassium: 3.7 mEq/L (ref 3.5–5.3)
Sodium: 136 mEq/L (ref 135–145)

## 2015-01-13 NOTE — Telephone Encounter (Signed)
Order released to lab.

## 2015-01-13 NOTE — Telephone Encounter (Signed)
Need a lab order sent . Thanks

## 2015-01-20 DIAGNOSIS — I87332 Chronic venous hypertension (idiopathic) with ulcer and inflammation of left lower extremity: Secondary | ICD-10-CM | POA: Diagnosis not present

## 2015-01-20 DIAGNOSIS — L97821 Non-pressure chronic ulcer of other part of left lower leg limited to breakdown of skin: Secondary | ICD-10-CM | POA: Diagnosis not present

## 2015-01-20 DIAGNOSIS — T8189XD Other complications of procedures, not elsewhere classified, subsequent encounter: Secondary | ICD-10-CM | POA: Diagnosis not present

## 2015-01-20 DIAGNOSIS — S81802D Unspecified open wound, left lower leg, subsequent encounter: Secondary | ICD-10-CM | POA: Diagnosis not present

## 2015-01-22 ENCOUNTER — Encounter: Payer: Self-pay | Admitting: Cardiovascular Disease

## 2015-01-24 ENCOUNTER — Other Ambulatory Visit: Payer: Self-pay | Admitting: Cardiovascular Disease

## 2015-01-26 NOTE — Telephone Encounter (Signed)
Rx has been sent to the pharmacy electronically. ° °

## 2015-01-27 DIAGNOSIS — S81802D Unspecified open wound, left lower leg, subsequent encounter: Secondary | ICD-10-CM | POA: Diagnosis not present

## 2015-01-27 DIAGNOSIS — T8189XD Other complications of procedures, not elsewhere classified, subsequent encounter: Secondary | ICD-10-CM | POA: Diagnosis not present

## 2015-01-27 DIAGNOSIS — I87332 Chronic venous hypertension (idiopathic) with ulcer and inflammation of left lower extremity: Secondary | ICD-10-CM | POA: Diagnosis not present

## 2015-01-27 DIAGNOSIS — L97821 Non-pressure chronic ulcer of other part of left lower leg limited to breakdown of skin: Secondary | ICD-10-CM | POA: Diagnosis not present

## 2015-02-02 ENCOUNTER — Ambulatory Visit (INDEPENDENT_AMBULATORY_CARE_PROVIDER_SITE_OTHER): Payer: Medicare Other | Admitting: Pharmacist Clinician (PhC)/ Clinical Pharmacy Specialist

## 2015-02-02 VITALS — BP 94/56 | HR 88

## 2015-02-02 DIAGNOSIS — I482 Chronic atrial fibrillation: Secondary | ICD-10-CM | POA: Diagnosis not present

## 2015-02-02 DIAGNOSIS — I4821 Permanent atrial fibrillation: Secondary | ICD-10-CM

## 2015-02-02 LAB — POCT INR: INR: 3.5

## 2015-02-03 DIAGNOSIS — L97821 Non-pressure chronic ulcer of other part of left lower leg limited to breakdown of skin: Secondary | ICD-10-CM | POA: Diagnosis not present

## 2015-02-03 DIAGNOSIS — S81802D Unspecified open wound, left lower leg, subsequent encounter: Secondary | ICD-10-CM | POA: Diagnosis not present

## 2015-02-03 DIAGNOSIS — T8189XD Other complications of procedures, not elsewhere classified, subsequent encounter: Secondary | ICD-10-CM | POA: Diagnosis not present

## 2015-02-03 DIAGNOSIS — I87332 Chronic venous hypertension (idiopathic) with ulcer and inflammation of left lower extremity: Secondary | ICD-10-CM | POA: Diagnosis not present

## 2015-02-04 ENCOUNTER — Telehealth: Payer: Self-pay | Admitting: Cardiovascular Disease

## 2015-02-04 DIAGNOSIS — Z79899 Other long term (current) drug therapy: Secondary | ICD-10-CM

## 2015-02-04 NOTE — Telephone Encounter (Signed)
Pt is having a hard time keeping his weight where Dr C wants it  to be. He is taking Meyolazone,not dropping his weight as he needs.

## 2015-02-04 NOTE — Telephone Encounter (Signed)
Please increase metolazone to a 5 mg dose. BMET next week.

## 2015-02-04 NOTE — Telephone Encounter (Signed)
Returned call to [name], patient's daughter. She reports she has has to give her father (patient) metolazone more frequently over last month than she had to previously. She was able to go about 5 days in between metolazone doses and is now about every 3 days on average. She reports her dad has decreased his daily sodium intake from 1500mg . She reports he snacks occasionally on peanut butter and crackers but not excessively. She reports they are diligent about eating out at places that accommodate low sodium diets. Patient denies SOB but c/o feels like he wants to sleep all the time - which has been the case for the last 6 months or so. He is still taking lasix 80mg  QAM and 40mg  QHS.   Daughter reports goal weight is 153lbs  Weight Log 3/1 151.4 3/2 152.4 3/3 155 (dose of metolazone given) 3/4 155 3/5 154.8 (dose of metolazone given) 3/6 150 3/7 149.6 3/8 152.2 3/9 154 (dose of metolazone given) 3/10 152.6 3/11 152.2 3/12 152 3/13 154.2 (dose of metolazone given) 3/14 152.2 3/15 152.4 3/16 153.2 (dose of metolazone given) 3/17 152.2 3/18 151.6 3/19 154.8 (dose of metolazone given) 3/20 152.6 3/21 152 3/22 154 (belly a little swollen, calves a little swollen) 3/23 155.4 (dose of metolazone given)  Informed daughter I will pass this information along to Dr. Sallyanne Kuster for advice on medications.

## 2015-02-04 NOTE — Telephone Encounter (Signed)
Spoke with [name] and informed her of recommendations per Dr. Loletha Grayer and that he would like patient to have labs next week. She voiced understanding of plan.   She also states her dad has dizziness and states he has a "2 beer buzz" but doesn't drink. She will ask Dr. Loletha Grayer about allopurinol at next visit regarding if this dose can be decreased to lessen SE of this medication.   Med list updated and lab ordered.

## 2015-02-05 ENCOUNTER — Encounter: Payer: Self-pay | Admitting: Cardiovascular Disease

## 2015-02-06 ENCOUNTER — Ambulatory Visit: Payer: Medicare Other | Admitting: Pharmacist Clinician (PhC)/ Clinical Pharmacy Specialist

## 2015-02-09 ENCOUNTER — Ambulatory Visit (INDEPENDENT_AMBULATORY_CARE_PROVIDER_SITE_OTHER): Payer: Medicare Other | Admitting: *Deleted

## 2015-02-09 DIAGNOSIS — Z950 Presence of cardiac pacemaker: Secondary | ICD-10-CM | POA: Diagnosis not present

## 2015-02-09 DIAGNOSIS — I5043 Acute on chronic combined systolic (congestive) and diastolic (congestive) heart failure: Secondary | ICD-10-CM | POA: Diagnosis not present

## 2015-02-10 DIAGNOSIS — S81802D Unspecified open wound, left lower leg, subsequent encounter: Secondary | ICD-10-CM | POA: Diagnosis not present

## 2015-02-10 DIAGNOSIS — T8189XD Other complications of procedures, not elsewhere classified, subsequent encounter: Secondary | ICD-10-CM | POA: Diagnosis not present

## 2015-02-10 DIAGNOSIS — L97821 Non-pressure chronic ulcer of other part of left lower leg limited to breakdown of skin: Secondary | ICD-10-CM | POA: Diagnosis not present

## 2015-02-10 DIAGNOSIS — I87332 Chronic venous hypertension (idiopathic) with ulcer and inflammation of left lower extremity: Secondary | ICD-10-CM | POA: Diagnosis not present

## 2015-02-11 ENCOUNTER — Encounter: Payer: Self-pay | Admitting: *Deleted

## 2015-02-11 NOTE — Progress Notes (Signed)
EPIC Encounter for ICM Monitoring  Patient Name: [name] is a 81 y.o. male Date: 02/11/2015 Primary Care Physican: [name],[name] Primary Cardiologist: [name] Electrophysiologist: [name] Dry Weight: 153 lbs   Bi-V pacing: 99%      In the past month, have you:  1. Gained more than 2 pounds in a day or more than 5 pounds in a week? Yes. The patient went out to eat Friday night with his girlfriend, per his daughter [name]'s report. His weight was up to 154.2 lbs on Saturday morning. He took metolazone 5 mg total on Saturday morning. His weight was back down to 149 lbs on Monday. He was 151.2 lbs on Tuesday, and 150.6 lbs today.  2. Had changes in your medications (with verification of current medications)? Yes. Per [name]'s report, they were trying to give him metolazone 2.5 mg when his weight went up, however, they were finding the patient was requiring this about every 3rd day. They have increased this to 5 mg total recently and this seems to control his fluids better. He will take potassium 40 meq BID, with an additional 10 meq BID on his metolazone days.   3.   Had more shortness of breath than is usual for you? no  4.   Limited your activity because of shortness of breath? No  5.   Not been able to sleep because of shortness of breath? no  6.   Had increased swelling in your feet or ankles? no  7.   Had symptoms of dehydration (dizziness, dry mouth, increased thirst, decreased urine output) no  3. Had changes in sodium restriction? Yes. This occurs once a week when he goes out to eat. Otherwise his family is trying to limit his sodium intake to between 1000 mg- 1500 mg.  4. Been compliant with medication? Yes   ICM trend:   Follow-up plan: ICM clinic phone appointment: 04/02/15. The patient's impedence was reading below baseline from ~ 2/27-3/8. Per his daughter's report, she does seem to recall his fluid were hard to control at that time. The family is very conscious of the  patient's sodium intake. He will go out to eat with is girlfriend on Friday nights and his sodium is not as well controlled during this time. Per [name], they are trying to do some education with the patient and his girlfriend on how to be more aware of what he is eating when he goes out.  The patient did have a repeat BMP drawn today. Results are pending at this time. I advised the patient's daughter I will call her tomorrow with the results. Per the patient's chart, he is to take potassium 40 mg BID and take an additional 40 meq on metolazone days. Per [name], the patient is taking potassium 40 mg BID and and additional 20 meq on metolazone days. He is due to follow up on 03/02/15 with Dr. Sallyanne Kuster.   Copy of note sent to patient's primary care physician, primary cardiologist, and device following physician.  Dylan Lemmings, RN, BSN 02/11/2015 5:01 PM

## 2015-02-12 ENCOUNTER — Telehealth: Payer: Self-pay | Admitting: *Deleted

## 2015-02-12 LAB — BASIC METABOLIC PANEL
BUN: 45 mg/dL — AB (ref 6–23)
CALCIUM: 9 mg/dL (ref 8.4–10.5)
CO2: 33 meq/L — AB (ref 19–32)
CREATININE: 1.16 mg/dL (ref 0.50–1.35)
Chloride: 93 mEq/L — ABNORMAL LOW (ref 96–112)
Glucose, Bld: 88 mg/dL (ref 70–99)
Potassium: 4.3 mEq/L (ref 3.5–5.3)
Sodium: 134 mEq/L — ABNORMAL LOW (ref 135–145)

## 2015-02-12 NOTE — Telephone Encounter (Signed)
-----   Message from [name], MD sent at 02/12/2015  8:21 AM EDT ----- Labs ok

## 2015-02-12 NOTE — Telephone Encounter (Signed)
Spoke to patient. Result given . Verbalized understanding  

## 2015-02-13 ENCOUNTER — Telehealth: Payer: Self-pay | Admitting: *Deleted

## 2015-02-13 NOTE — Telephone Encounter (Signed)
I called and spoke with the patient's daughter, [name], to make sure she was aware of the patient's lab results from 02/11/15. She was aware of these, but also reported the patient's weights to me from mid-March as she did not have these in front of her when I spoke with her on 3/30.   Baseline- 153 lbs 3/18- 151.6 lbs 3/19- 154.8 lbs - metolazone 2.5 mg x 1 dose given 3/20- 152.6 lbs 3/21-152.0 lbs 3/22- 154.0 lbs 3/23- 155.4 lbs - metolazone 2.5 mg x 1 dose given 3/24- 153.2 lbs 3/25- 152.6 lbs 3/26- 154.6 lbs - metolazone 5 mg x 1 dose given 3/27- 153.6 lbs 3/28- 149.0 lbs 3/29- 151.2 lbs 3/30- 150.6 lbs 3/31- 154.6 lbs - metolazone 5 mg x 1 dose given  Per [name], the patient had no change in his sodium/ fluid intake when his weight went up 4 lbs yesterday. He did report feeling full. His labs from 3/30 showed Na/ K+/ BUN/ Creatinine- 134/ 4.3/ 45/1.16. I advised her to continue the patient's current medications and continue to weigh him. She did not have his weight from today. I have asked that they monitor his BP at home. She reports the patient constantly feels like he has a " 2 beer buzz, " which he has reported to Dr. Sallyanne Kuster. She reports he has had this for over a year and wonders if some of his medications are contributing to this. She questions whether or not he still needs to be on allopurinol or could the dose be cut in 1/2 since the patient has not had a gout "flare up" in quite some time. I advised I would forward the patient's weights to Dr. Sallyanne Kuster and ask about his allopurinol - per [facility], this was last prescribed by Dr. Sallyanne Kuster in 09/2014. I have asked [name] to forward me the patient's weights from today and through the weekend in [place] on Monday. She is agreeable. I will call her back with any further recommendations.

## 2015-02-13 NOTE — Addendum Note (Signed)
Addended by: [name] C on: 02/13/2015 09:27 AM   Modules accepted: Orders, Medications

## 2015-02-13 NOTE — Telephone Encounter (Signed)
Continue same meds. Let's recheck uric acid with next BMET in about 1 month before we change uric acid dose  thanks

## 2015-02-16 NOTE — Telephone Encounter (Signed)
I spoke with the patient's daughter, [name], this morning to make her aware of Dr. Victorino December recommendations. The patient will see Dr. Sallyanne Kuster on 4/18- note entered on his appointment that he will need a repeat bmet and uric acid level.   [name], also reported the patient's weights over the weekend: 4/1- 152.4 lbs 4/2- 152.2 lbs 4/3- 153.2 lbs- no metolazone given 4/4- no weight available yet  I have advised her to continue the patient's medications as they are and they will follow up on 4/18 with Dr. Sallyanne Kuster.

## 2015-02-17 ENCOUNTER — Encounter (HOSPITAL_BASED_OUTPATIENT_CLINIC_OR_DEPARTMENT_OTHER): Payer: Medicare Other | Attending: General Surgery

## 2015-02-17 DIAGNOSIS — T8189XD Other complications of procedures, not elsewhere classified, subsequent encounter: Secondary | ICD-10-CM | POA: Insufficient documentation

## 2015-02-17 DIAGNOSIS — L97921 Non-pressure chronic ulcer of unspecified part of left lower leg limited to breakdown of skin: Secondary | ICD-10-CM | POA: Insufficient documentation

## 2015-02-17 DIAGNOSIS — T50905D Adverse effect of unspecified drugs, medicaments and biological substances, subsequent encounter: Secondary | ICD-10-CM | POA: Diagnosis not present

## 2015-02-17 DIAGNOSIS — Y839 Surgical procedure, unspecified as the cause of abnormal reaction of the patient, or of later complication, without mention of misadventure at the time of the procedure: Secondary | ICD-10-CM | POA: Insufficient documentation

## 2015-02-24 DIAGNOSIS — T50905D Adverse effect of unspecified drugs, medicaments and biological substances, subsequent encounter: Secondary | ICD-10-CM | POA: Diagnosis not present

## 2015-02-24 DIAGNOSIS — L97921 Non-pressure chronic ulcer of unspecified part of left lower leg limited to breakdown of skin: Secondary | ICD-10-CM | POA: Diagnosis not present

## 2015-02-24 DIAGNOSIS — T8189XD Other complications of procedures, not elsewhere classified, subsequent encounter: Secondary | ICD-10-CM | POA: Diagnosis not present

## 2015-03-02 ENCOUNTER — Ambulatory Visit (INDEPENDENT_AMBULATORY_CARE_PROVIDER_SITE_OTHER): Payer: Medicare Other | Admitting: Cardiovascular Disease

## 2015-03-02 ENCOUNTER — Encounter: Payer: Self-pay | Admitting: Cardiovascular Disease

## 2015-03-02 ENCOUNTER — Ambulatory Visit (INDEPENDENT_AMBULATORY_CARE_PROVIDER_SITE_OTHER): Payer: Medicare Other | Admitting: Pharmacist Clinician (PhC)/ Clinical Pharmacy Specialist

## 2015-03-02 VITALS — BP 102/62 | HR 73 | Resp 16 | Ht 71.0 in | Wt 154.4 lb

## 2015-03-02 DIAGNOSIS — I2510 Atherosclerotic heart disease of native coronary artery without angina pectoris: Secondary | ICD-10-CM

## 2015-03-02 DIAGNOSIS — I255 Ischemic cardiomyopathy: Secondary | ICD-10-CM | POA: Diagnosis not present

## 2015-03-02 DIAGNOSIS — Z950 Presence of cardiac pacemaker: Secondary | ICD-10-CM

## 2015-03-02 DIAGNOSIS — I4821 Permanent atrial fibrillation: Secondary | ICD-10-CM

## 2015-03-02 DIAGNOSIS — I4891 Unspecified atrial fibrillation: Secondary | ICD-10-CM | POA: Diagnosis not present

## 2015-03-02 DIAGNOSIS — I482 Chronic atrial fibrillation: Secondary | ICD-10-CM

## 2015-03-02 DIAGNOSIS — Z7901 Long term (current) use of anticoagulants: Secondary | ICD-10-CM | POA: Diagnosis not present

## 2015-03-02 DIAGNOSIS — I5042 Chronic combined systolic (congestive) and diastolic (congestive) heart failure: Secondary | ICD-10-CM

## 2015-03-02 DIAGNOSIS — I5023 Acute on chronic systolic (congestive) heart failure: Secondary | ICD-10-CM

## 2015-03-02 DIAGNOSIS — R0602 Shortness of breath: Secondary | ICD-10-CM | POA: Diagnosis not present

## 2015-03-02 DIAGNOSIS — I442 Atrioventricular block, complete: Secondary | ICD-10-CM

## 2015-03-02 DIAGNOSIS — Z79899 Other long term (current) drug therapy: Secondary | ICD-10-CM | POA: Diagnosis not present

## 2015-03-02 LAB — POCT INR: INR: 3.7

## 2015-03-02 MED ORDER — ALLOPURINOL 300 MG PO TABS: 150.0000 mg | ORAL_TABLET | Freq: Every day | ORAL | Status: AC

## 2015-03-02 MED ORDER — FUROSEMIDE 80 MG PO TABS: 80.0000 mg | ORAL_TABLET | Freq: Every morning | ORAL | Status: DC

## 2015-03-02 MED ORDER — FUROSEMIDE 40 MG PO TABS: 40.0000 mg | ORAL_TABLET | Freq: Every evening | ORAL | Status: DC

## 2015-03-02 NOTE — Patient Instructions (Addendum)
Take Metolazone 2.5mg  on Tuesdays and Saturdays.    Your physician recommends that you weigh, daily, at the same time every day, and in the same amount of clothing. Please record your daily weights on the handout provided and bring it to your next appointment.  Let us know when you get your weight below 153 lbs.   DECREASE Allopurinol to 1/2 tablet daily.  Your physician recommends that you return for lab work in: Before your next appointment.  Dr. Sallyanne Kuster recommends that you schedule a follow-up appointment in: 1st available.

## 2015-03-02 NOTE — Progress Notes (Signed)
Patient ID: [name], male   DOB: 07/23/1926, 81 y.o.   MRN: [id number]     Cardiology Office Note   Date:  03/05/2015   ID:  [name], DOB Feb 10, 1926, MRN [id number]  PCP:  Thalia Party  Cardiologist:   [name], MD   Chief Complaint  Patient presents with  . Follow-up    patient reports leg pain and lightheadedness. patient's daughter reports that he complains of an "ear buzzing"      History of Present Illness: [name] is a 81 y.o. male who presents for CHF, CRT-P, CAD  Kingsley continues to have NYHA class III heart failure symptoms and appears fragile. His leg wounds are healing very slowly despite aggressive wound care and he is to undergo a graft procedure. He has a very narrow margin of compensation between hypotension and heart failure. He eats out on Friday night. He goes dancing Thursday and Friday. Clearly hypervolemic at >154 lb.  Interrogation of his CRT pacemaker shows that his Corvue (thoracic impedance) is below "normal", but shows an improving trend after metolazone dosing 4 days ago.. There is 98% BiV pacing on a background of AF and complete heart block. His JVP is still elevated today at 4-5 cm above the sternal angle with very prominent v waves.[name] has underlying complete heart block and is pacemaker dependent. He has ischemic cardiomyopathy with a left ventricular ejection fraction around 25-30%. Had coronary bypass surgery in 1995. His cardiac catheterization in 2009 showed all his grafts to be widely patent. His most recent functional study was a nuclear stress test in 2006 that showed a very large inferior, septal and apical scar with relatively preserved perfusion in the anterior and lateral walls. There was no meaningful ischemia.  He had a biventricular device but developed pocket erosion roughly a year after generator change and had to be explanted in July 2014. He had a "temporary permanent" ventricular pacemaker placed percutaneously  for a few weeks. This was complicated by deep venous thrombosis involving the left internal jugular and subclavian vein. A new CRT pacemaker was implanted but during the interval between the 2 devices he developed hard to manage congestive heart failure. Some improvement occurred after the new biventricular device was implanted, but he seemed to be receiving less successful resynchronization therapy, likely due to the more limited pacing vectors available with CRT-P versus CRT-D. He required rehospitalization in February 2015 for acute heart failure with hyponatremia and worsening renal function. LV pacing was changed from LV3 to can with simultaneous RV and LV pacing, Reprogrammed LV3 to RV ring with LV first by 2msec. Since then his heart failure has been easier to manage, even though he still requires higher doses of diuretics than in the past. He has permanent atrial fibrillation on chronic warfarin anticoagulation.  He has pulmonary fibrosis, presumed to be asbestosis.  Well treated hypertension and hyperlipidemia and compensated hypothyroidism.    Past Medical History  Diagnosis Date  . Ischemic cardiomyopathy     EF-35-45%by echo in 2011; first dx in the 90's but again in 2009 & rec'd 1st BiV ICD   . Paroxysmal atrial fibrillation     sinus rhythm on amiodarone  . Asbestos exposure     plaques on CT  . Compression fracture     T7  . Hypertension   . Coronary artery disease     last cath 2009, hx CABG  . Automatic implantable cardioverter-defibrillator in situ     now explanted  .  Hypothyroidism   . CHF (congestive heart failure)     hx.  . Myocardial infarction     INFERIOR [phone] wth PTCA; Ant MI 1995 wth PTCA   . Shortness of breath     "only when external pacemaker wasn't working right" (06/17/2013)  . Arthritis     "a touch all over" (06/17/2013)  . COPD (chronic obstructive pulmonary disease)     "a touch" (8/4//2014)  . Atrial fibrillation, permanent 07/22/2013  .  Biventricular cardiac pacemaker in situ 06/2013    St. Jude Allura (pt no longer wished for ICD portion)  . Pacemaker   . CHB (complete heart block) 11/26/2014    Past Surgical History  Procedure Laterality Date  . Inguinal hernia repair Right   . Coronary artery bypass graft  11/01/1994    SVG to first diagonal, to distal RCA, to the ramus intermedius artery, and LIMA the LAD  . 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 Cataract extraction w/ intraocular lens  implant, bilateral Bilateral   . Icd lead removal Left 05/27/2013    Procedure: ICD LEAD REMOVAL;  Surgeon: Evans Lance, MD;  Location: [facility];  Service: Cardiovascular;  Laterality: Left;  . Bi-ventricular pacemaker insertion (crt-p) Right 06/17/2013    St Jude  . Pacemaker removal Left 05/2013  . Coronary angioplasty with stent placement  12/17/2001    to VG-diag wth Zeta stent  . Cardiac catheterization  2009    Patent LIMA-LAD; patent VG-diag wth mild in-stent restenosis; patent VG-ramus intemedius; patent VG-RCA wth 70% mid post lat stenosis  . Cardiac catheterization  03/16/2006    patent LIMA, patent VGs;  EF 30%  . Cardioversion  10/12/2010    successful cardioversion  . Coronary angioplasty  [phone]    LAD;Inf MI PTCA-RCA; ant MI PTCA- LAD  . Implantable cardioverter defibrillator (icd) generator change N/A 10/26/2012    Procedure: ICD GENERATOR CHANGE;  Surgeon: [name], MD;  Location: [facility] CATH LAB;  Service: Cardiovascular;  Laterality: N/A;  . Bi-ventricular pacemaker insertion N/A 06/17/2013    Procedure: BI-VENTRICULAR PACEMAKER INSERTION (CRT-P);  Surgeon: Evans Lance, MD;  Location: [facility] CATH LAB;  Service: Cardiovascular;  Laterality: N/A;     Current Outpatient Prescriptions  Medication Sig Dispense Refill   . allopurinol (ZYLOPRIM) 300 MG tablet Take 0.5 tablets (150 mg total) by mouth daily. 30 tablet 6  . bimatoprost (LUMIGAN) 0.03 % ophthalmic solution Place 1 drop into both eyes at bedtime.     . budesonide-formoterol (SYMBICORT) 160-4.5 MCG/ACT inhaler Inhale 2 puffs into the lungs 2 (two) times daily. 1 Inhaler 5  . captopril (CAPOTEN) 12.5 MG tablet TAKE 1/2 TABLET TWICE A DAY. 30 tablet 6  . docusate sodium (COLACE) 100 MG capsule Take 400 mg by mouth at bedtime.    . dorzolamide-timolol (COSOPT) 22.3-6.8 MG/ML ophthalmic solution Place 1 drop into both eyes 2 (two) times daily.     Marland Kitchen FIBER PO Take 5 capsules by mouth at bedtime.    . furosemide (LASIX) 80 MG tablet Take 1 tablet (80 mg total) by mouth every morning. 135 tablet 3  . Iron-Vitamin C (VITRON-C PO) Take 1 tablet by mouth 2 (two) times daily.     Marland Kitchen levothyroxine (SYNTHROID, LEVOTHROID) 125 MCG tablet  Take 125 mcg by mouth daily before breakfast.    . magnesium oxide (MAG-OX) 400 MG tablet Take 400 mg by mouth daily.    . metolazone (ZAROXOLYN) 2.5 MG tablet Take 2.5 mg by mouth.    . Omega 3-6-9 Fatty Acids (TRIPLE OMEGA-3-6-9) CAPS Take by mouth.    . pantoprazole (PROTONIX) 40 MG tablet Take 1 tablet (40 mg total) by mouth daily. 30 tablet 11  . polyvinyl alcohol (LIQUIFILM TEARS) 1.4 % ophthalmic solution Place 1 drop into both eyes 2 (two) times daily.    . potassium chloride (K-DUR,KLOR-CON) 10 MEQ tablet Take 4 tablets (40 meq ) by mouth twice daily with an additional 10 meq by mouth twice daily on metolazone days    . warfarin (COUMADIN) 5 MG tablet Take 2.5-5 mg by mouth daily. Takes 1 tablet (5mg ) daily except 1/2 tablet (2.5 mg) every Tues and Sat.    . digoxin (LANOXIN) 0.125 MG tablet Take 1 tablet (0.125 mg total) by mouth at bedtime. 90 tablet 3  . furosemide (LASIX) 40 MG tablet Take 1 tablet (40 mg total) by mouth every evening. 90 tablet 3   No current facility-administered medications for this visit.     Allergies:   Chocolate flavor; Ciprofloxacin; and Other    Social History:  The patient  reports that he has never smoked. He has never used smokeless tobacco. He reports that he drinks about 8.4 oz of alcohol per week. He reports that he does not use illicit drugs.   Family History:  The patient's family history includes Heart disease in his father.    ROS:  Please see the history of present illness.    Otherwise, review of systems positive for NYHA class 3 exertional dyspnea, fatigue and sleepiness, chronic leg edema.   All other systems are reviewed and negative.    PHYSICAL EXAM: VS:  BP 102/62 mmHg  Pulse 73  Resp 16  Ht 5\' 11"  (1.803 m)  Wt 154 lb 6.4 oz (70.035 kg)  BMI 21.54 kg/m2 , BMI Body mass index is 21.54 kg/(m^2). General: Alert, oriented x3, no acute distress  Head: no evidence of trauma, PERRL, EOMI, no exophtalmos or lid lag, no myxedema, no xanthelasma; normal ears, nose and oropharynx  Neck: jugular venous pulsations are elevated 8-10 cm and there is prompt hepatojugular reflux; v waves are very prominent; brisk carotid pulses without delay and no carotid bruits  Chest: clear to auscultation, no signs of consolidation by percussion or palpation, normal fremitus, symmetrical and full respiratory excursions. Both the old device sites and the new CRT pacemaker site appeared healthy without evidence of infection  Cardiovascular: normal position and quality of the apical impulse, regular rhythm, normal first and paradoxically split second heart sounds, no rubs. S3 gallop present, 2 / 6 holosystolic murmur at the left lower sternal border  Abdomen: no tenderness or distention, no masses by palpation, no abnormal pulsatility or arterial bruits, normal bowel sounds, no hepatosplenomegaly  Extremities: no clubbing, cyanosis or edema; 2+ radial, ulnar and brachial pulses bilaterally; 2+ right femoral, posterior tibial and dorsalis pedis pulses; 2+ left femoral,  posterior tibial and dorsalis pedis pulses; no subclavian or femoral bruits  Roughly 3 cm in diameter crusted yellowish ulceration on the left anterior shin. There is erythema and swelling in the surrounding soft tissue, slight warmth Neurological: grossly nonfocal  EKG:  EKG is not ordered today.   Recent Labs: 08/17/2014: ALT 16; TSH 2.250 08/18/2014: Hemoglobin 11.4*; Platelets 134* 02/11/2015: BUN  45*; Creatinine 1.16; Potassium 4.3; Sodium 134*    Lipid Panel    Component Value Date/Time   CHOL 152 12/13/2012 0950   TRIG 159* 12/13/2012 0950   HDL 28* 12/13/2012 0950   CHOLHDL 5.4 12/13/2012 0950   VLDL 32 12/13/2012 0950   LDLCALC 92 12/13/2012 0950      Wt Readings from Last 3 Encounters:  03/02/15 154 lb 6.4 oz (70.035 kg)  01/08/15 156 lb 8 oz (70.988 kg)  11/26/14 161 lb 3.2 oz (73.12 kg)       ASSESSMENT AND PLAN:  Chronic combined systolic and diastolic congestive heart failure, NYHA class 3 Has narrow margin of compensation. Continue to try to shoot for a dry weight of under 153 pounds. He will take metolazone twice weekly, especially on Saturday (after eating out). Blood pressure prevents additional titration of ACE inhibitor.  Monthly Corvue downloads - enrolled in ICM.  Gout On allopurinol, no recent episodes. Reduce dose to account for reduced GFR. Note gout attacks with aggressive diuresis in the past.  Biventricular cardiac pacemaker in situ Normal device function. LV lead pacing configuration (mid3 to RV ring). This configuration has a relatively high pacing threshold (1.75 V at 1.0 ms pulse width) but appears to provide optimal resynchronization.  Atrial fibrillation, permanent Appropriate warfarin anticoagulation with therapeutic levels.   CAD- CABG X 4 '95, patent grafts '09 No angina symptoms to suggest coronary insufficiency at this time. Ilir is not interested in any invasive procedures, so we decided not to pursue workup at this point in  time.  Nonhealing wound, left anterior shin Chronic edema is part of the problem.Now being seen in the wound clinic with plan for foreskin allograft.   Current medicines are reviewed at length with the patient today.  The patient does not have concerns regarding medicines.  The following changes have been made:  Schedule metolazone twice weekly. Reduce allopurinol dose.  Labs/ tests ordered today include:  Orders Placed This Encounter  Procedures  . Comprehensive metabolic panel  . Uric acid  . Brain natriuretic peptide  . EKG 12-Lead    Patient Instructions  Take Metolazone 2.5mg  on Tuesdays and Saturdays.    Your physician recommends that you weigh, daily, at the same time every day, and in the same amount of clothing. Please record your daily weights on the handout provided and bring it to your next appointment.  Let us know when you get your weight below 153 lbs.   DECREASE Allopurinol to 1/2 tablet daily.  Your physician recommends that you return for lab work in: Before your next appointment.  Dr. Sallyanne Kuster recommends that you schedule a follow-up appointment in: 1st available.           Mikael Spray, MD  03/05/2015 10:16 AM    [name], MD, [facility] [phone] office [phone] pager

## 2015-03-03 ENCOUNTER — Other Ambulatory Visit: Payer: Self-pay | Admitting: Cardiovascular Disease

## 2015-03-03 DIAGNOSIS — L97921 Non-pressure chronic ulcer of unspecified part of left lower leg limited to breakdown of skin: Secondary | ICD-10-CM | POA: Diagnosis not present

## 2015-03-03 DIAGNOSIS — T50905D Adverse effect of unspecified drugs, medicaments and biological substances, subsequent encounter: Secondary | ICD-10-CM | POA: Diagnosis not present

## 2015-03-03 DIAGNOSIS — T8189XD Other complications of procedures, not elsewhere classified, subsequent encounter: Secondary | ICD-10-CM | POA: Diagnosis not present

## 2015-03-03 NOTE — Telephone Encounter (Signed)
Rx(s) sent to pharmacy electronically.  

## 2015-03-05 ENCOUNTER — Other Ambulatory Visit: Payer: Self-pay | Admitting: Dermatology

## 2015-03-05 ENCOUNTER — Encounter: Payer: Self-pay | Admitting: Cardiovascular Disease

## 2015-03-05 LAB — MDC_IDC_ENUM_SESS_TYPE_INCLINIC
Implantable Pulse Generator Model: 3242
Implantable Pulse Generator Serial Number: 7515957
Lead Channel Impedance Value: 450 Ohm
Lead Channel Pacing Threshold Amplitude: 0.75 V
Lead Channel Pacing Threshold Pulse Width: 0.4 ms
Lead Channel Pacing Threshold Pulse Width: 1 ms
Lead Channel Setting Pacing Amplitude: 2.5 V
Lead Channel Setting Pacing Pulse Width: 1 ms
Lead Channel Setting Sensing Sensitivity: 6 mV
MDC IDC MSMT BATTERY REMAINING PERCENTAGE: 95 % — AB
MDC IDC MSMT BATTERY VOLTAGE: 2.96 V
MDC IDC MSMT LEADCHNL LV IMPEDANCE VALUE: 530 Ohm
MDC IDC MSMT LEADCHNL LV PACING THRESHOLD AMPLITUDE: 1.25 V
MDC IDC SET LEADCHNL RV PACING AMPLITUDE: 2.5 V
MDC IDC SET LEADCHNL RV PACING PULSEWIDTH: 0.4 ms

## 2015-03-10 DIAGNOSIS — T8189XD Other complications of procedures, not elsewhere classified, subsequent encounter: Secondary | ICD-10-CM | POA: Diagnosis not present

## 2015-03-10 DIAGNOSIS — T50905D Adverse effect of unspecified drugs, medicaments and biological substances, subsequent encounter: Secondary | ICD-10-CM | POA: Diagnosis not present

## 2015-03-10 DIAGNOSIS — L97921 Non-pressure chronic ulcer of unspecified part of left lower leg limited to breakdown of skin: Secondary | ICD-10-CM | POA: Diagnosis not present

## 2015-03-11 ENCOUNTER — Encounter: Payer: Self-pay | Admitting: Cardiovascular Disease

## 2015-03-17 ENCOUNTER — Encounter (HOSPITAL_BASED_OUTPATIENT_CLINIC_OR_DEPARTMENT_OTHER): Payer: Medicare Other | Attending: General Surgery

## 2015-03-17 DIAGNOSIS — L97921 Non-pressure chronic ulcer of unspecified part of left lower leg limited to breakdown of skin: Secondary | ICD-10-CM | POA: Diagnosis not present

## 2015-03-17 DIAGNOSIS — L97821 Non-pressure chronic ulcer of other part of left lower leg limited to breakdown of skin: Secondary | ICD-10-CM | POA: Diagnosis not present

## 2015-03-17 DIAGNOSIS — Y839 Surgical procedure, unspecified as the cause of abnormal reaction of the patient, or of later complication, without mention of misadventure at the time of the procedure: Secondary | ICD-10-CM | POA: Diagnosis not present

## 2015-03-17 DIAGNOSIS — T8189XD Other complications of procedures, not elsewhere classified, subsequent encounter: Secondary | ICD-10-CM | POA: Diagnosis present

## 2015-03-17 DIAGNOSIS — T50905D Adverse effect of unspecified drugs, medicaments and biological substances, subsequent encounter: Secondary | ICD-10-CM | POA: Diagnosis not present

## 2015-03-17 DIAGNOSIS — S81802D Unspecified open wound, left lower leg, subsequent encounter: Secondary | ICD-10-CM | POA: Diagnosis not present

## 2015-03-17 DIAGNOSIS — I87332 Chronic venous hypertension (idiopathic) with ulcer and inflammation of left lower extremity: Secondary | ICD-10-CM | POA: Insufficient documentation

## 2015-03-19 ENCOUNTER — Ambulatory Visit (INDEPENDENT_AMBULATORY_CARE_PROVIDER_SITE_OTHER): Payer: Medicare Other | Admitting: Pharmacist Clinician (PhC)/ Clinical Pharmacy Specialist

## 2015-03-19 DIAGNOSIS — I4891 Unspecified atrial fibrillation: Secondary | ICD-10-CM | POA: Diagnosis not present

## 2015-03-19 DIAGNOSIS — I482 Chronic atrial fibrillation: Secondary | ICD-10-CM

## 2015-03-19 DIAGNOSIS — Z7901 Long term (current) use of anticoagulants: Secondary | ICD-10-CM | POA: Diagnosis not present

## 2015-03-19 DIAGNOSIS — I4821 Permanent atrial fibrillation: Secondary | ICD-10-CM

## 2015-03-19 LAB — POCT INR: INR: 2.3

## 2015-03-24 DIAGNOSIS — S81802D Unspecified open wound, left lower leg, subsequent encounter: Secondary | ICD-10-CM | POA: Diagnosis not present

## 2015-03-24 DIAGNOSIS — L97921 Non-pressure chronic ulcer of unspecified part of left lower leg limited to breakdown of skin: Secondary | ICD-10-CM | POA: Diagnosis not present

## 2015-03-24 DIAGNOSIS — I87332 Chronic venous hypertension (idiopathic) with ulcer and inflammation of left lower extremity: Secondary | ICD-10-CM | POA: Diagnosis not present

## 2015-03-24 DIAGNOSIS — T8189XD Other complications of procedures, not elsewhere classified, subsequent encounter: Secondary | ICD-10-CM | POA: Diagnosis not present

## 2015-03-31 DIAGNOSIS — T8189XD Other complications of procedures, not elsewhere classified, subsequent encounter: Secondary | ICD-10-CM | POA: Diagnosis not present

## 2015-03-31 DIAGNOSIS — S81802D Unspecified open wound, left lower leg, subsequent encounter: Secondary | ICD-10-CM | POA: Diagnosis not present

## 2015-03-31 DIAGNOSIS — L97921 Non-pressure chronic ulcer of unspecified part of left lower leg limited to breakdown of skin: Secondary | ICD-10-CM | POA: Diagnosis not present

## 2015-03-31 DIAGNOSIS — I87332 Chronic venous hypertension (idiopathic) with ulcer and inflammation of left lower extremity: Secondary | ICD-10-CM | POA: Diagnosis not present

## 2015-04-02 ENCOUNTER — Ambulatory Visit (INDEPENDENT_AMBULATORY_CARE_PROVIDER_SITE_OTHER): Payer: Medicare Other | Admitting: *Deleted

## 2015-04-02 ENCOUNTER — Telehealth: Payer: Self-pay | Admitting: *Deleted

## 2015-04-02 ENCOUNTER — Encounter: Payer: Self-pay | Admitting: Cardiovascular Disease

## 2015-04-02 ENCOUNTER — Encounter: Payer: Self-pay | Admitting: *Deleted

## 2015-04-02 DIAGNOSIS — Z79899 Other long term (current) drug therapy: Secondary | ICD-10-CM

## 2015-04-02 DIAGNOSIS — I5043 Acute on chronic combined systolic (congestive) and diastolic (congestive) heart failure: Secondary | ICD-10-CM

## 2015-04-02 DIAGNOSIS — Z950 Presence of cardiac pacemaker: Secondary | ICD-10-CM | POA: Diagnosis not present

## 2015-04-02 NOTE — Progress Notes (Addendum)
EPIC Encounter for ICM Monitoring  Patient Name: [name] is a 81 y.o. male Date: 04/02/2015 Primary Care Physican: [name],[name] Primary Cardiologist: [name] Electrophysiologist: [name] Dry Weight: 153 lbs   Bi-V pacing: 98%       In the past month, have you:  1. Gained more than 2 pounds in a day or more than 5 pounds in a week? See weights below- sent through "My Chart"   From  [name]   To  Dylan Klein, MD   Sent  04/02/2015 11:24 AM      April                                                       May  1- 152.4                                                  1- 149.0   2- 152.2                                                  2- 147.2  3- 153.2                                                  3- 148.0 (d)  4- 153.0                                                  4- 148.6  5- 155.4 (d)                                              5- 147.0  6- 151.2                                                  6- 151.4  7- 149.6                                                  7- 152.6 (d)  8- 151.6                                                  8- 150.8  9- 153.0  9- 147.0  10- 156.0 (d)                                           10- 147.4 (d)  11- 151.4                                                11- 144.8  12- 151.6                                                12- 145.2  13- 151.0                                                13- 147.0  14- 153.8                                                14- 148.6 (d)  15- 155.0 (d)                                           15- 147.4  16- 151.6                                                16- 145.2  17- 149.0                                                17- 147.0 (d)  18- 150.0                                                18- 146.0  19- d                                                       19- 147.2  20- 151.0   21- 150.2  22- 150.4  23- did not weighd  24-  did not weigh  25- 151.2   26- 151.4 (d)  27- 149.2  28- 147.6   29- 148.2  30- 150.6 (d)    ** (d) = dose of metolazone this day        2. Had changes in your medications (with verification of current medications)? Yes. The patient saw  Dr. Sallyanne Kuster on 03/02/15 and he was instructed to take metolazone on Tuesdays and Fridays- per the patient's daughter, they were already giving him a 5 mg total dose of metolazone when he was taking this- prior to his office visit with Dr. Sallyanne Kuster. The patient's AVS from 03/02/15 states to take metolazone 2.5 mg once daily on Tuesdays and Fridays. The patient's daughter states he is taking 2.5 mg two tablets (5 mg) once daily on Tuesdays and Fridays.   3. Had more shortness of breath than is usual for you? no  4. Limited your activity because of shortness of breath? no  5. Not been able to sleep because of shortness of breath? no  6. Had increased swelling in your feet or ankles? no  7. Had symptoms of dehydration (dizziness, dry mouth, increased thirst, decreased urine output) no  8. Had changes in sodium restriction? no  9. Been compliant with medication? Yes   ICM trend:   Follow-up plan: ICM clinic phone appointment: 05/28/15. The patient's corvue diagnostics have been very stable since the patient was seen by Dr. Sallyanne Kuster on 03/02/15. Prior to that, they had been slightly elevated from ~ 4/1-4/21. Per the patient's daughter, [name], the patient has been doing well. He is active and doing what he wants to do. His weights have dropped into the upper 140's. I inquired how his BP has been doing- this has not been checked regularly, but on Saturday it was around 111/73. He typically does have some dizziness, but this is not any worse for him. He is staying hydrated. His potassium is being adjusted accordingly on metolazone days. He will occasionally complain of cramping in his hands and his daughter will give him an extra tablet of potassium. I advised  her to continue on the same regimen for now. I explained I would forward my note to Dr. Sallyanne Kuster to make sure he still wants him on metolazone 5 mg once daily on Tues/ Sat. Per [name], the 2.5 mg dose does not seem to keep his weight from increasing. She is also wanting to know if a digoxin level can be added to labs to be drawn prior to his follow up with Dr. Sallyanne Kuster on 6/13. They are planning on doing labs around 04/21/15. I advised I would ask if a digoxin level could be added and if his labs needed to be done any sooner than 04/21/15. I will call her back with recommendations. She is agreeable.   Copy of note sent to patient's primary care physician, primary cardiologist, and device following physician.  [name], RN, BSN 04/02/2015 11:59 AM  Thanks. Will get BMET and Dig level next week. [name], can you please order?     ----- Message -----     From: [name], RN     Sent: 04/02/2015 12:29 PM      To: Dylan Klein, MD

## 2015-04-02 NOTE — Telephone Encounter (Signed)
ICM transmission received. I left a message for the patient's daughter, [name], to call.

## 2015-04-02 NOTE — Addendum Note (Signed)
Addended by: [name] C on: 04/02/2015 12:35 PM   Modules accepted: Medications

## 2015-04-02 NOTE — Telephone Encounter (Signed)
I spoke with the patients daughter

## 2015-04-03 ENCOUNTER — Encounter: Payer: Self-pay | Admitting: Cardiology

## 2015-04-03 NOTE — Telephone Encounter (Signed)
-----   Message from [name], MD sent at 04/02/2015  1:28 PM EDT ----- Thanks. Will get BMET and Dig level next week. [name], can you please order? ----- Message -----    From: [name], RN    Sent: 04/02/2015  12:29 PM      To: [name], MD

## 2015-04-03 NOTE — Telephone Encounter (Signed)
Digoxin level order to be added to next blood draw.

## 2015-04-03 NOTE — Telephone Encounter (Signed)
-----   Message from [name], RN sent at 04/02/2015  4:51 PM EDT ----- Louretta Shorten,  I spoke with the daughter of this patient of Dr. Lurline Del earlier today about his heart failure diagnostics on his device. He already had labs ordered for a CMP/ uric acid/ bnp prior to his follow up with Dr. Loletha Grayer on 6/13. The family was going to take him around 6/7 for labs, but per Dr. Loletha Grayer- today, the patient needs labs next week with a digoxin added on as well. The patient's daughter [name] is aware. I just wasn't sure how you all did your labs from there. Can you please order the digoxin in addition to what he already has ordered. I'm not sure if the family will need to pick up the additional order- please notify them if they need to do this.   Thank you, [name], RN, BSN

## 2015-04-03 NOTE — Telephone Encounter (Signed)
Order placed for BMET and Dig level.

## 2015-04-07 DIAGNOSIS — T8189XD Other complications of procedures, not elsewhere classified, subsequent encounter: Secondary | ICD-10-CM | POA: Diagnosis not present

## 2015-04-08 ENCOUNTER — Ambulatory Visit (INDEPENDENT_AMBULATORY_CARE_PROVIDER_SITE_OTHER): Payer: Medicare Other | Admitting: Pharmacist Clinician (PhC)/ Clinical Pharmacy Specialist

## 2015-04-08 DIAGNOSIS — I482 Chronic atrial fibrillation: Secondary | ICD-10-CM

## 2015-04-08 DIAGNOSIS — Z7901 Long term (current) use of anticoagulants: Secondary | ICD-10-CM | POA: Diagnosis not present

## 2015-04-08 DIAGNOSIS — I4891 Unspecified atrial fibrillation: Secondary | ICD-10-CM | POA: Diagnosis not present

## 2015-04-08 DIAGNOSIS — I4821 Permanent atrial fibrillation: Secondary | ICD-10-CM

## 2015-04-08 LAB — POCT INR: INR: 2.1

## 2015-04-09 LAB — URIC ACID: URIC ACID, SERUM: 8.4 mg/dL — AB (ref 4.0–7.8)

## 2015-04-09 LAB — COMPREHENSIVE METABOLIC PANEL
ALT: 17 U/L (ref 0–53)
AST: 20 U/L (ref 0–37)
Albumin: 4.1 g/dL (ref 3.5–5.2)
Alkaline Phosphatase: 96 U/L (ref 39–117)
BILIRUBIN TOTAL: 0.9 mg/dL (ref 0.2–1.2)
BUN: 53 mg/dL — ABNORMAL HIGH (ref 6–23)
CO2: 34 meq/L — AB (ref 19–32)
Calcium: 9.2 mg/dL (ref 8.4–10.5)
Chloride: 91 mEq/L — ABNORMAL LOW (ref 96–112)
Creat: 1.41 mg/dL — ABNORMAL HIGH (ref 0.50–1.35)
Glucose, Bld: 96 mg/dL (ref 70–99)
POTASSIUM: 3.6 meq/L (ref 3.5–5.3)
SODIUM: 134 meq/L — AB (ref 135–145)
TOTAL PROTEIN: 6.7 g/dL (ref 6.0–8.3)

## 2015-04-09 LAB — BRAIN NATRIURETIC PEPTIDE: Brain Natriuretic Peptide: 399.7 pg/mL — ABNORMAL HIGH (ref 0.0–100.0)

## 2015-04-14 DIAGNOSIS — I87332 Chronic venous hypertension (idiopathic) with ulcer and inflammation of left lower extremity: Secondary | ICD-10-CM | POA: Diagnosis not present

## 2015-04-14 DIAGNOSIS — L97921 Non-pressure chronic ulcer of unspecified part of left lower leg limited to breakdown of skin: Secondary | ICD-10-CM | POA: Diagnosis not present

## 2015-04-14 DIAGNOSIS — T8189XD Other complications of procedures, not elsewhere classified, subsequent encounter: Secondary | ICD-10-CM | POA: Diagnosis not present

## 2015-04-14 DIAGNOSIS — S81802D Unspecified open wound, left lower leg, subsequent encounter: Secondary | ICD-10-CM | POA: Diagnosis not present

## 2015-04-16 ENCOUNTER — Ambulatory Visit (INDEPENDENT_AMBULATORY_CARE_PROVIDER_SITE_OTHER): Payer: Medicare Other | Admitting: Podiatry

## 2015-04-16 ENCOUNTER — Encounter: Payer: Self-pay | Admitting: Podiatry

## 2015-04-16 VITALS — BP 104/58 | HR 73 | Temp 97.2°F | Resp 12

## 2015-04-16 DIAGNOSIS — B351 Tinea unguium: Secondary | ICD-10-CM

## 2015-04-16 DIAGNOSIS — M79675 Pain in left toe(s): Secondary | ICD-10-CM

## 2015-04-16 DIAGNOSIS — M79674 Pain in right toe(s): Secondary | ICD-10-CM

## 2015-04-16 DIAGNOSIS — I255 Ischemic cardiomyopathy: Secondary | ICD-10-CM

## 2015-04-16 NOTE — Progress Notes (Signed)
Patient ID: [name], male   DOB: Aug 11, 1926, 81 y.o.   MRN: [id number] Complaint:  Visit Type: Patient returns to my office for continued preventative foot care services. Complaint: Patient states" my nails have grown long and thick and become painful to walk and wear shoes. He presents for preventative foot care services. No changes to ROS  Podiatric Exam: Vascular: dorsalis pedis and posterior tibial pulses are palpable bilateral. Capillary return is immediate. Temperature gradient is WNL. Skin turgor WNL  Sensorium: Normal Semmes Weinstein monofilament test. Normal tactile sensation bilaterally. Nail Exam: Pt has thick disfigured discolored nails with subungual debris noted bilateral entire nail hallux through fifth toenails Ulcer Exam: There is no evidence of ulcer or pre-ulcerative changes or infection. Orthopedic Exam: Muscle tone and strength are WNL. No limitations in general ROM. No crepitus or effusions noted. Foot type and digits show no abnormalities. Bony prominences are unremarkable. Skin: No Porokeratosis. No infection or ulcers  Diagnosis:  Tinea unguium, Pain in right toe, pain in left toes  Treatment & Plan Procedures and Treatment: Consent by patient was obtained for treatment procedures. The patient understood the discussion of treatment and procedures well. All questions were answered thoroughly reviewed. Debridement of mycotic and hypertrophic toenails, 1 through 5 bilateral and clearing of subungual debris. No ulceration, no infection noted.  Return Visit-Office Procedure: Patient instructed to return to the office for a follow up visit 3 months for continued evaluation and treatment.

## 2015-04-16 NOTE — Progress Notes (Signed)
   Subjective:    Patient ID: [name], male    DOB: Feb 01, 1926, 81 y.o.   MRN: [id number]  HPI    Review of Systems  Eyes: Positive for visual disturbance.       Objective:   Physical Exam        Assessment & Plan:

## 2015-04-21 ENCOUNTER — Ambulatory Visit (INDEPENDENT_AMBULATORY_CARE_PROVIDER_SITE_OTHER): Payer: Medicare Other | Admitting: Podiatry

## 2015-04-21 ENCOUNTER — Other Ambulatory Visit: Payer: Self-pay | Admitting: Cardiovascular Disease

## 2015-04-21 ENCOUNTER — Encounter: Payer: Self-pay | Admitting: Podiatry

## 2015-04-21 ENCOUNTER — Encounter (HOSPITAL_BASED_OUTPATIENT_CLINIC_OR_DEPARTMENT_OTHER): Payer: Medicare Other | Attending: General Surgery

## 2015-04-21 ENCOUNTER — Telehealth: Payer: Self-pay | Admitting: *Deleted

## 2015-04-21 VITALS — BP 126/69 | HR 74 | Temp 96.8°F | Resp 16

## 2015-04-21 DIAGNOSIS — T148 Other injury of unspecified body region: Secondary | ICD-10-CM

## 2015-04-21 DIAGNOSIS — I872 Venous insufficiency (chronic) (peripheral): Secondary | ICD-10-CM | POA: Insufficient documentation

## 2015-04-21 DIAGNOSIS — T148XXA Other injury of unspecified body region, initial encounter: Secondary | ICD-10-CM

## 2015-04-21 DIAGNOSIS — L97821 Non-pressure chronic ulcer of other part of left lower leg limited to breakdown of skin: Secondary | ICD-10-CM | POA: Diagnosis not present

## 2015-04-21 DIAGNOSIS — B351 Tinea unguium: Secondary | ICD-10-CM

## 2015-04-21 NOTE — Progress Notes (Signed)
Subjective:     Patient ID: [name], male   DOB: [birth year], 81 y.o.   MRN: [id number]  HPI  Patient returns to the office with blackened area at the tip of the fungus nail left foot and redness behind nail left foot.  He says there is no pain associated with either of these findings.  He was seen by myself on 04/16/15 for  RFC.  His daughter is concerned about these finding and brings him to the office.   Review of Systems     Objective:   Physical ExamNeurovascular same as last visit.  He has redness at the proximal nail fold but no increased temperature or infection.  Blood blister noted at distal  Nail.  Fluctuance noted at the nail distally.     Assessment:     Blood blister left hallux     Plan:     I & D blister  Neosporin DSD.  Call the office as needed.  I believe my previous services caused the nail to be irritated causing these finding.  Watch and allow the nail to heal.

## 2015-04-21 NOTE — Telephone Encounter (Signed)
Pt's dtr, [name] states pt's feet are very swollen, and the big toes are red and swollen especially the left, the family is concerned the pt has an infection or cellulitis.  [name] states pt is severely allergic to Cipro and is on Coumadin and has to be extremely careful with the Steroid.  Pt is also diagnosed with CHF.

## 2015-04-21 NOTE — Telephone Encounter (Signed)
Rx(s) sent to pharmacy electronically.  

## 2015-04-25 ENCOUNTER — Other Ambulatory Visit: Payer: Self-pay | Admitting: Cardiovascular Disease

## 2015-04-27 ENCOUNTER — Ambulatory Visit (INDEPENDENT_AMBULATORY_CARE_PROVIDER_SITE_OTHER): Payer: Medicare Other | Admitting: Pharmacist Clinician (PhC)/ Clinical Pharmacy Specialist

## 2015-04-27 ENCOUNTER — Telehealth: Payer: Self-pay

## 2015-04-27 ENCOUNTER — Ambulatory Visit (INDEPENDENT_AMBULATORY_CARE_PROVIDER_SITE_OTHER): Payer: Medicare Other | Admitting: Cardiovascular Disease

## 2015-04-27 ENCOUNTER — Encounter: Payer: Self-pay | Admitting: Cardiovascular Disease

## 2015-04-27 VITALS — BP 104/62 | HR 76 | Ht 71.0 in | Wt 151.1 lb

## 2015-04-27 DIAGNOSIS — I2510 Atherosclerotic heart disease of native coronary artery without angina pectoris: Secondary | ICD-10-CM

## 2015-04-27 DIAGNOSIS — I442 Atrioventricular block, complete: Secondary | ICD-10-CM | POA: Diagnosis not present

## 2015-04-27 DIAGNOSIS — Z7901 Long term (current) use of anticoagulants: Secondary | ICD-10-CM

## 2015-04-27 DIAGNOSIS — I482 Chronic atrial fibrillation: Secondary | ICD-10-CM | POA: Diagnosis not present

## 2015-04-27 DIAGNOSIS — I4821 Permanent atrial fibrillation: Secondary | ICD-10-CM

## 2015-04-27 DIAGNOSIS — I255 Ischemic cardiomyopathy: Secondary | ICD-10-CM | POA: Diagnosis not present

## 2015-04-27 DIAGNOSIS — I5042 Chronic combined systolic (congestive) and diastolic (congestive) heart failure: Secondary | ICD-10-CM

## 2015-04-27 DIAGNOSIS — I4891 Unspecified atrial fibrillation: Secondary | ICD-10-CM | POA: Diagnosis not present

## 2015-04-27 LAB — POCT INR: INR: 2.4

## 2015-04-27 MED ORDER — POTASSIUM CHLORIDE CRYS ER 10 MEQ PO TBCR: EXTENDED_RELEASE_TABLET | ORAL | Status: DC

## 2015-04-27 NOTE — Patient Instructions (Signed)
On Tuesdays and Saturdays:  If weight is less than 146 do not take Metolazone                                                   If weight is 146.2 to 149.8  Take 2.5mg  of  Metolazone                                                   If weight is equal to or greater than 150 take 5mg  of Metolazone  Dr. Sallyanne Kuster recommends that you schedule a follow-up appointment in: 6 weeks.

## 2015-04-27 NOTE — Progress Notes (Signed)
Patient ID: [name], male   DOB: [birth year], 81 y.o.   MRN: [id number]     Cardiology Office Note   Date:  04/27/2015   ID:  [name], DOB 1926/10/23, MRN [id number]  PCP:  [name]  Cardiologist:   Dylan Klein, MD   Chief Complaint  Patient presents with  . Follow-up    No complaints of chest pain, SOB, or edema.      History of Present Illness: [name] is a 81 y.o. male who presents for follow-up of stage D heart failure, NYHA class III. [name] has found new balance, albeit at a lower functional level then he had a couple of years ago. His current fairly good level of compensation is in large part due to the meticulous attention that his daughter pays to his diet, daily weight measurements and medication adjustment. She brings a very detailed spreadsheet with his diuretic dosing and his weight. He seems to do best when his weight is in the 145-150 pounds on his home scale. He has been receiving metolazone twice a week on Tuesdays and Saturdays, usually 5 mg. Recently she has noticed that she can give him only 2.5 mg when his weight is around 145 pounds, avoiding excessive sleepiness and dehydration.  Our office scale overestimates his weight by roughly 5 pounds compared to his home scale.  His complaint remains fatigue and daytime sleepiness. He still goes out to have dinner with his girlfriend 2 nights a week. His leg wound is healing very slowly.  Biventricular pacemaker interrogation shows normal function. All lead parameters are stable. Generator longevity is estimated to be roughly 6.6-7.2 years (a minor adjustment was made to left ventricular lead pulse width, adding a few months to generator longevity). He has 99.9% biventricular pacing, the device is programmed VVIR for permanent atrial fibrillation with complete heart block His LV lead is programmed M3-RV ring, LV first by 30 ms. His thoracic impedance was slightly decreased in late May returning to baseline  around June 1.  [name] has underlying complete heart block and is pacemaker dependent. He has ischemic cardiomyopathy with a left ventricular ejection fraction around 25-30%. Had coronary bypass surgery in 1995. His cardiac catheterization in 2009 showed all his grafts to be widely patent. His most recent functional study was a nuclear stress test in 2006 that showed a very large inferior, septal and apical scar with relatively preserved perfusion in the anterior and lateral walls. There was no meaningful ischemia.  He had a biventricular device but developed pocket erosion roughly a year after generator change and had to be explanted in July 2014. He had a "temporary permanent" ventricular pacemaker placed percutaneously for a few weeks. This was complicated by deep venous thrombosis involving the left internal jugular and subclavian vein. A new CRT pacemaker was implanted but during the interval between the 2 devices he developed hard to manage congestive heart failure. Some improvement occurred after the new biventricular device was implanted, but he seemed to be receiving less successful resynchronization therapy, likely due to the more limited pacing vectors available with CRT-P versus CRT-D. He required rehospitalization in February 2015 for acute heart failure with hyponatremia and worsening renal function. LV pacing was changed from LV3 to can with simultaneous RV and LV pacing, Reprogrammed LV3 to RV ring with LV first by 75msec. Since then his heart failure has been easier to manage, even though he still requires higher doses of diuretics than in the past. He has permanent atrial  fibrillation on chronic warfarin anticoagulation.  He has pulmonary fibrosis, presumed to be asbestosis, well treated hypertension and hyperlipidemia and compensated hypothyroidism.   Past Medical History  Diagnosis Date  . Ischemic cardiomyopathy     EF-35-45%by echo in 2011; first dx in the 90's but again in 2009 &  rec'd 1st BiV ICD   . Paroxysmal atrial fibrillation     sinus rhythm on amiodarone  . Asbestos exposure     plaques on CT  . Compression fracture     T7  . Hypertension   . Coronary artery disease     last cath 2009, hx CABG  . Automatic implantable cardioverter-defibrillator in situ     now explanted  . Hypothyroidism   . CHF (congestive heart failure)     hx.  . Myocardial infarction     INFERIOR [phone] wth PTCA; Ant MI 1995 wth PTCA   . Shortness of breath     "only when external pacemaker wasn't working right" (06/17/2013)  . Arthritis     "a touch all over" (06/17/2013)  . COPD (chronic obstructive pulmonary disease)     "a touch" (8/4//2014)  . Atrial fibrillation, permanent 07/22/2013  . Biventricular cardiac pacemaker in situ 06/2013    St. Jude Allura (pt no longer wished for ICD portion)  . Pacemaker   . CHB (complete heart block) 11/26/2014    Past Surgical History  Procedure Laterality Date  . Inguinal hernia repair Right   . Coronary artery bypass graft  11/01/1994    SVG to first diagonal, to distal RCA, to the ramus intermedius artery, and LIMA the LAD  . 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 Cataract extraction w/ intraocular lens  implant, bilateral Bilateral   . Icd lead removal Left 05/27/2013    Procedure: ICD LEAD REMOVAL;  Surgeon: Dylan Lance, MD;  Location: [facility];  Service: Cardiovascular;  Laterality: Left;  . Bi-ventricular pacemaker insertion (crt-p) Right 06/17/2013    St Jude  . Pacemaker removal Left 05/2013  . Coronary angioplasty with stent placement  12/17/2001    to VG-diag wth Zeta stent  . Cardiac catheterization  2009    Patent LIMA-LAD; patent VG-diag wth mild in-stent restenosis; patent VG-ramus intemedius; patent VG-RCA wth 70% mid post lat  stenosis  . Cardiac catheterization  03/16/2006    patent LIMA, patent VGs;  EF 30%  . Cardioversion  10/12/2010    successful cardioversion  . Coronary angioplasty  [phone]    LAD;Inf MI PTCA-RCA; ant MI PTCA- LAD  . Implantable cardioverter defibrillator (icd) generator change N/A 10/26/2012    Procedure: ICD GENERATOR CHANGE;  Surgeon: Dylan Klein, MD;  Location: [facility] CATH LAB;  Service: Cardiovascular;  Laterality: N/A;  . Bi-ventricular pacemaker insertion N/A 06/17/2013    Procedure: BI-VENTRICULAR PACEMAKER INSERTION (CRT-P);  Surgeon: Dylan Lance, MD;  Location: [facility] CATH LAB;  Service: Cardiovascular;  Laterality: N/A;     Current Outpatient Prescriptions  Medication Sig Dispense Refill  . allopurinol (ZYLOPRIM) 300 MG tablet Take 0.5 tablets (150 mg total) by mouth daily. 30 tablet 6  . bimatoprost (LUMIGAN) 0.03 % ophthalmic solution Place 1 drop into both eyes at bedtime.     . budesonide-formoterol (SYMBICORT) 160-4.5 MCG/ACT inhaler Inhale 2 puffs  into the lungs 2 (two) times daily. 1 Inhaler 5  . captopril (CAPOTEN) 12.5 MG tablet Take 0.5 tablets (6.25 mg total) by mouth 2 (two) times daily. 30 tablet 10  . digoxin (LANOXIN) 0.125 MG tablet Take 1 tablet (0.125 mg total) by mouth at bedtime. 90 tablet 3  . docusate sodium (COLACE) 100 MG capsule Take 300 mg by mouth at bedtime.     . dorzolamide-timolol (COSOPT) 22.3-6.8 MG/ML ophthalmic solution Place 1 drop into both eyes 2 (two) times daily.     Marland Kitchen FIBER PO Take 5 capsules by mouth at bedtime.    . furosemide (LASIX) 40 MG tablet Take 1 tablet (40 mg total) by mouth every evening. 90 tablet 3  . furosemide (LASIX) 80 MG tablet Take 1 tablet (80 mg total) by mouth every morning. 135 tablet 3  . Iron-Vitamin C (VITRON-C PO) Take 1 tablet by mouth 2 (two) times daily.     Marland Kitchen levothyroxine (SYNTHROID, LEVOTHROID) 125 MCG tablet Take 125 mcg by mouth daily before breakfast.    . magnesium oxide (MAG-OX) 400 MG tablet Take  400 mg by mouth daily.    . metolazone (ZAROXOLYN) 2.5 MG tablet Take two tablets (5 mg) by mouth once daily on Tuesdays & Saturdays    . Omega 3-6-9 Fatty Acids (TRIPLE OMEGA-3-6-9) CAPS Take 1 capsule by mouth 2 (two) times daily.     . pantoprazole (PROTONIX) 40 MG tablet Take 1 tablet (40 mg total) by mouth daily. 30 tablet 11  . potassium chloride (K-DUR,KLOR-CON) 10 MEQ tablet Take 4 tablets (40 meq ) by mouth twice daily with an additional 10 meq by mouth twice daily on metolazone days or as needed 300 tablet 6  . warfarin (COUMADIN) 5 MG tablet Take 2.5-5 mg by mouth daily. Takes 1 tablet (5mg ) daily except 1/2 tablet (2.5 mg) every Tues and Sat.    . polyvinyl alcohol (LIQUIFILM TEARS) 1.4 % ophthalmic solution Place 1 drop into both eyes 2 (two) times daily.     No current facility-administered medications for this visit.    Allergies:   Chocolate flavor; Ciprofloxacin; and Other    Social History:  The patient  reports that he has never smoked. He has never used smokeless tobacco. He reports that he drinks about 8.4 oz of alcohol per week. He reports that he does not use illicit drugs.   Family History:  The patient's family history includes Heart disease in his father.    ROS:  Please see the history of present illness.    Otherwise, review of systems positive for none.   All other systems are reviewed and negative.    PHYSICAL EXAM: VS:  BP 104/62 mmHg  Pulse 76  Ht 5\' 11"  (1.803 m)  Wt 68.539 kg (151 lb 1.6 oz)  BMI 21.08 kg/m2 , BMI Body mass index is 21.08 kg/(m^2).  General: Alert, oriented x3, no acute distress  Head: no evidence of trauma, PERRL, EOMI, no exophtalmos or lid lag, no myxedema, no xanthelasma; normal ears, nose and oropharynx  Neck: jugular venous pulsations are elevated 4-5 cm and there is prompt hepatojugular reflux; v waves are very prominent; brisk carotid pulses without delay and no carotid bruits  Chest: clear to auscultation, no signs of  consolidation by percussion or palpation, normal fremitus, symmetrical and full respiratory excursions. Both the old device sites and the new CRT pacemaker site appeared healthy without evidence of infection  Cardiovascular: normal position and quality of the apical impulse,  regular rhythm, normal first and paradoxically split second heart sounds, no rubs. S3 gallop present, 2 / 6 holosystolic murmur at the left lower sternal border  Abdomen: no tenderness or distention, no masses by palpation, no abnormal pulsatility or arterial bruits, normal bowel sounds, no hepatosplenomegaly  Extremities: no clubbing, cyanosis or edema; 2+ radial, ulnar and brachial pulses bilaterally; 2+ right femoral, posterior tibial and dorsalis pedis pulses; 2+ left femoral, posterior tibial and dorsalis pedis pulses; no subclavian or femoral bruits  Roughly 3 cm in diameter crusted yellowish ulceration on the left anterior shin. There is erythema and swelling in the surrounding soft tissue, slight warmth Neurological: grossly nonfocal Psych: euthymic mood, full affect   EKG:  EKG is not ordered today.   Recent Labs: 08/17/2014: TSH 2.250 08/18/2014: Hemoglobin 11.4*; Platelets 134* 04/08/2015: ALT 17; BUN 53*; Creat 1.41*; Potassium 3.6; Sodium 134*    Lipid Panel    Component Value Date/Time   CHOL 152 12/13/2012 0950   TRIG 159* 12/13/2012 0950   HDL 28* 12/13/2012 0950   CHOLHDL 5.4 12/13/2012 0950   VLDL 32 12/13/2012 0950   LDLCALC 92 12/13/2012 0950      Wt Readings from Last 3 Encounters:  04/27/15 68.539 kg (151 lb 1.6 oz)  03/02/15 70.035 kg (154 lb 6.4 oz)  01/08/15 70.988 kg (156 lb 8 oz)     ASSESSMENT AND PLAN:  Chronic combined systolic and diastolic congestive heart failure, NYHA class 3 Has narrow margin of compensation. Continue to try to shoot for a dry weight of under 153 pounds. He will take metolazone twice weekly, on Saturday (after eating out) and Tuesday. If his weight is  under 146 pounds he will not take metolazone at all. If his weight is 146/150 pounds he will take 2.5 mg metolazone. If his weight is in excess of 150 pounds he will take 5 mg metolazone. Blood pressure prevents additional titration of ACE inhibitor.  Monthly Corvue downloads - enrolled in ICM.  Gout On allopurinol, no recent episodes. Reduce dose to account for reduced GFR. No gout attacks, these have occurred with aggressive diuresis in the past.  Biventricular cardiac pacemaker in situ Normal device function. LV lead pacing configuration (mid3 to RV ring). This configuration has a relatively high pacing threshold, but appears to provide optimal resynchronization.  Atrial fibrillation, permanent Appropriate warfarin anticoagulation with therapeutic levels.   CAD- CABG X 4 '95, patent grafts '09 No angina symptoms to suggest coronary insufficiency at this time. Gianno is not interested in any invasive procedures, so we decided not to pursue workup at this point in time.  Nonhealing wound, left anterior shin  Current medicines are reviewed at length with the patient today.  The patient does not have concerns regarding medicines.  The following changes have been made:  no change  Labs/ tests ordered today include:  No orders of the defined types were placed in this encounter.    Patient Instructions  On Tuesdays and Saturdays:  If weight is less than 146 do not take Metolazone                                                   If weight is 146.2 to 149.8  Take 2.5mg  of  Metolazone  If weight is equal to or greater than 150 take 5mg  of Metolazone  Dr. Sallyanne Kuster recommends that you schedule a follow-up appointment in: 6 weeks.      Mikael Spray, MD  04/27/2015 5:10 PM    Dylan Klein, MD, [facility] [phone] office [phone] pager

## 2015-04-27 NOTE — Telephone Encounter (Signed)
DECLINED MEDICATIONS MEDICATION WAS FILLED ON 04/22/15

## 2015-04-27 NOTE — Telephone Encounter (Signed)
Error

## 2015-04-28 ENCOUNTER — Telehealth: Payer: Self-pay | Admitting: *Deleted

## 2015-04-28 DIAGNOSIS — L97821 Non-pressure chronic ulcer of other part of left lower leg limited to breakdown of skin: Secondary | ICD-10-CM | POA: Diagnosis not present

## 2015-04-28 DIAGNOSIS — I872 Venous insufficiency (chronic) (peripheral): Secondary | ICD-10-CM | POA: Diagnosis not present

## 2015-04-28 DIAGNOSIS — Z79899 Other long term (current) drug therapy: Secondary | ICD-10-CM

## 2015-04-28 LAB — CUP PACEART INCLINIC DEVICE CHECK
Lead Channel Impedance Value: 560 Ohm
Lead Channel Pacing Threshold Amplitude: 1 V
Lead Channel Pacing Threshold Amplitude: 1.25 V
Lead Channel Pacing Threshold Pulse Width: 0.4 ms
Lead Channel Pacing Threshold Pulse Width: 0.7 ms
Lead Channel Setting Pacing Amplitude: 2.5 V
Lead Channel Setting Sensing Sensitivity: 6 mV
MDC IDC MSMT BATTERY REMAINING PERCENTAGE: 95 % — AB
MDC IDC MSMT BATTERY VOLTAGE: 2.96 V
MDC IDC MSMT LEADCHNL RV IMPEDANCE VALUE: 450 Ohm
MDC IDC PG MODEL: 3242
MDC IDC SESS DTM: 20160614113841
MDC IDC SET LEADCHNL LV PACING PULSEWIDTH: 0.7 ms
MDC IDC SET LEADCHNL RV PACING AMPLITUDE: 2.5 V
MDC IDC SET LEADCHNL RV PACING PULSEWIDTH: 0.4 ms
MDC IDC STAT BRADY RV PERCENT PACED: 98 %
Pulse Gen Serial Number: 7515957

## 2015-04-28 NOTE — Telephone Encounter (Signed)
LVM for [name] (daughter) to call concerning labs that need to be drawn prior to Mr. Shoffners' next visit.  Order placed for BMP and Dig level.

## 2015-05-05 DIAGNOSIS — I872 Venous insufficiency (chronic) (peripheral): Secondary | ICD-10-CM | POA: Diagnosis not present

## 2015-05-05 DIAGNOSIS — L97821 Non-pressure chronic ulcer of other part of left lower leg limited to breakdown of skin: Secondary | ICD-10-CM | POA: Diagnosis not present

## 2015-05-07 ENCOUNTER — Other Ambulatory Visit: Payer: Self-pay | Admitting: Cardiovascular Disease

## 2015-05-07 NOTE — Telephone Encounter (Signed)
REFILL 

## 2015-05-12 ENCOUNTER — Encounter: Payer: Self-pay | Admitting: Cardiovascular Disease

## 2015-05-12 DIAGNOSIS — I872 Venous insufficiency (chronic) (peripheral): Secondary | ICD-10-CM | POA: Diagnosis not present

## 2015-05-12 DIAGNOSIS — L97821 Non-pressure chronic ulcer of other part of left lower leg limited to breakdown of skin: Secondary | ICD-10-CM | POA: Diagnosis not present

## 2015-05-19 ENCOUNTER — Encounter (HOSPITAL_BASED_OUTPATIENT_CLINIC_OR_DEPARTMENT_OTHER): Payer: Medicare Other | Attending: General Surgery

## 2015-05-19 DIAGNOSIS — I87332 Chronic venous hypertension (idiopathic) with ulcer and inflammation of left lower extremity: Secondary | ICD-10-CM | POA: Insufficient documentation

## 2015-05-19 DIAGNOSIS — L97221 Non-pressure chronic ulcer of left calf limited to breakdown of skin: Secondary | ICD-10-CM | POA: Insufficient documentation

## 2015-05-25 ENCOUNTER — Ambulatory Visit (INDEPENDENT_AMBULATORY_CARE_PROVIDER_SITE_OTHER): Payer: Medicare Other | Admitting: Pharmacist Clinician (PhC)/ Clinical Pharmacy Specialist

## 2015-05-25 DIAGNOSIS — I482 Chronic atrial fibrillation: Secondary | ICD-10-CM

## 2015-05-25 DIAGNOSIS — I4821 Permanent atrial fibrillation: Secondary | ICD-10-CM

## 2015-05-25 DIAGNOSIS — I4891 Unspecified atrial fibrillation: Secondary | ICD-10-CM | POA: Diagnosis not present

## 2015-05-25 DIAGNOSIS — Z7901 Long term (current) use of anticoagulants: Secondary | ICD-10-CM | POA: Diagnosis not present

## 2015-05-25 LAB — POCT INR: INR: 1.7

## 2015-05-26 DIAGNOSIS — L97221 Non-pressure chronic ulcer of left calf limited to breakdown of skin: Secondary | ICD-10-CM | POA: Diagnosis not present

## 2015-05-26 DIAGNOSIS — I87332 Chronic venous hypertension (idiopathic) with ulcer and inflammation of left lower extremity: Secondary | ICD-10-CM | POA: Diagnosis not present

## 2015-05-28 ENCOUNTER — Ambulatory Visit (INDEPENDENT_AMBULATORY_CARE_PROVIDER_SITE_OTHER): Payer: Medicare Other | Admitting: *Deleted

## 2015-05-28 ENCOUNTER — Telehealth: Payer: Self-pay | Admitting: *Deleted

## 2015-05-28 DIAGNOSIS — I5043 Acute on chronic combined systolic (congestive) and diastolic (congestive) heart failure: Secondary | ICD-10-CM

## 2015-05-28 DIAGNOSIS — Z950 Presence of cardiac pacemaker: Secondary | ICD-10-CM | POA: Diagnosis not present

## 2015-05-28 NOTE — Telephone Encounter (Signed)
ICM transmission received. I left a message for the patient's daughter, [name], that I would call her back tomorrow.

## 2015-05-29 NOTE — Telephone Encounter (Signed)
I spoke with the patients daughter

## 2015-05-29 NOTE — Telephone Encounter (Signed)
I left a message for the patient's daughter to call. 

## 2015-05-29 NOTE — Progress Notes (Signed)
EPIC Encounter for ICM Monitoring  Patient Name: [name] is a 81 y.o. male Date: 05/29/2015 Primary Care Physican: [name],[name] Primary Cardiologist: [name] Electrophysiologist: [name] Dry Weight: < 153 lbs  Bi-V pacing: 98%       In the past month, have you:  1. Gained more than 2 pounds in a day or more than 5 pounds in a week? I spoke with the patient's daughter today. She reports that the patient's weights have shown some slight flucuations over the last few weeks. He did require 5 mg of metolazone on Saturday. This correlates with a slight increase in his corvue trends from 7/7-7/10. He was 144.7 lbs yesterday and 147 lbs today. If the patient's weight is at 150 lbs, [name] feels that she can see lower extremity edema and that the patient appears more tired.   2. Had changes in your medications (with verification of current medications)? Yes. The patient saw Dr. Sallyanne Kuster on 04/27/15 and recommendations were made for metolazone dosing: wt < 146 lbs - no metolazone, wt 146-150 lbs - take metolazone 2.5 mg once daily, wt >150 lbs- take metolazone 5 mg once daily.  3. Had more shortness of breath than is usual for you? no  4. Limited your activity because of shortness of breath? No. He has been active per [name] and was out on the tractor this week.   5. Not been able to sleep because of shortness of breath? no  6. Had increased swelling in your feet or ankles? Occasional if weight is 150 lbs or more.  7. Had symptoms of dehydration (dizziness, dry mouth, increased thirst, decreased urine output) no  8. Had changes in sodium restriction? no  9. Been compliant with medication? Yes   ICM trend:   Follow-up plan: ICM clinic phone appointment: 07/23/15. The patient is doing well today per his daughter, [name]. She is very meticulous on monitoring the patient's weights and dosing his metolazone appropriately. I commended her efforts today as the patient's corvue readings look very  good as of late. He did have a slight increase in corvue measurements from ~ 7/7-7/10. [name] reports that she did have to give the patient at total of 5 mg of metolazone on Saturday 05/22/15. Trends are stable at present. Goal weight has been < 153 lbs. [name] feels that if the patient reaches 150 lbs, he is having swelling and increased fatigue. She has been following metolazone dosing instructions as per Dr. Sallyanne Kuster. The patient is due to follow up 06/17/15 with Dr. Sallyanne Kuster. I advised no changes today- continue present dosing regimen. I will follow up with the patient 4 weeks after he sees Dr. Sallyanne Kuster.    Copy of note sent to patient's primary care physician, primary cardiologist, and device following physician.  [name], RN, BSN 05/29/2015 11:04 AM

## 2015-06-15 ENCOUNTER — Ambulatory Visit: Payer: Medicare Other | Admitting: Pharmacist Clinician (PhC)/ Clinical Pharmacy Specialist

## 2015-06-17 ENCOUNTER — Ambulatory Visit (INDEPENDENT_AMBULATORY_CARE_PROVIDER_SITE_OTHER): Payer: Medicare Other | Admitting: Cardiovascular Disease

## 2015-06-17 ENCOUNTER — Ambulatory Visit (INDEPENDENT_AMBULATORY_CARE_PROVIDER_SITE_OTHER): Payer: Medicare Other | Admitting: Pharmacist Clinician (PhC)/ Clinical Pharmacy Specialist

## 2015-06-17 ENCOUNTER — Encounter: Payer: Self-pay | Admitting: Cardiovascular Disease

## 2015-06-17 VITALS — BP 106/72 | HR 72 | Ht 71.0 in | Wt 149.0 lb

## 2015-06-17 DIAGNOSIS — I480 Paroxysmal atrial fibrillation: Secondary | ICD-10-CM | POA: Diagnosis not present

## 2015-06-17 DIAGNOSIS — I4891 Unspecified atrial fibrillation: Secondary | ICD-10-CM | POA: Diagnosis not present

## 2015-06-17 DIAGNOSIS — Z950 Presence of cardiac pacemaker: Secondary | ICD-10-CM

## 2015-06-17 DIAGNOSIS — Z79899 Other long term (current) drug therapy: Secondary | ICD-10-CM | POA: Diagnosis not present

## 2015-06-17 DIAGNOSIS — I5043 Acute on chronic combined systolic (congestive) and diastolic (congestive) heart failure: Secondary | ICD-10-CM

## 2015-06-17 DIAGNOSIS — I442 Atrioventricular block, complete: Secondary | ICD-10-CM | POA: Diagnosis not present

## 2015-06-17 DIAGNOSIS — I482 Chronic atrial fibrillation: Secondary | ICD-10-CM

## 2015-06-17 DIAGNOSIS — I5042 Chronic combined systolic (congestive) and diastolic (congestive) heart failure: Secondary | ICD-10-CM

## 2015-06-17 DIAGNOSIS — Z7901 Long term (current) use of anticoagulants: Secondary | ICD-10-CM | POA: Diagnosis not present

## 2015-06-17 DIAGNOSIS — I4821 Permanent atrial fibrillation: Secondary | ICD-10-CM

## 2015-06-17 DIAGNOSIS — I255 Ischemic cardiomyopathy: Secondary | ICD-10-CM | POA: Diagnosis not present

## 2015-06-17 LAB — POCT INR: INR: 2.4

## 2015-06-17 NOTE — Patient Instructions (Signed)
Your physician wants you to follow-up in: 3 Months with Pacer Check. You will receive a reminder letter in the mail two months in advance. If you don't receive a letter, please call our office to schedule the follow-up appointment.  Your physician recommends that you return for lab work CMP

## 2015-06-17 NOTE — Progress Notes (Signed)
Patient ID: [name], male   DOB: [birth year], 81 y.o.   MRN: [id number]     Cardiology Office Note   Date:  06/19/2015   ID:  [name], DOB 1926-05-27, MRN [id number]  PCP:  Thalia Party  Cardiologist:   [name], MD   Chief Complaint  Patient presents with  . [place] UP    Patient has felt light headed and dizzy.      History of Present Illness: [name] is a 81 y.o. male who presents for f/u of stage D heart failure, complete heart block s/p CRT-P, permanent atrial fibrillation, CAD.  He has done remarkably well recently, with steady fluid balance as measured by clinical status, weight and thoracic impedance. He takes daily loop diuretic and metolazone twice weekly and has not required any additional doses recently. Chronic NYHA class II-III dyspnea on exertion. No syncope. Target weight 153 lb or less on home scale.  Pacemaker check shows 98% BiV pacing (pacer dependent, 2% PVCs). No VT. Generator longevity estimated at > 7 years.    Past Medical History  Diagnosis Date  . Ischemic cardiomyopathy     EF-35-45%by echo in 2011; first dx in the 90's but again in 2009 & rec'd 1st BiV ICD   . Paroxysmal atrial fibrillation     sinus rhythm on amiodarone  . Asbestos exposure     plaques on CT  . Compression fracture     T7  . Hypertension   . Coronary artery disease     last cath 2009, hx CABG  . Automatic implantable cardioverter-defibrillator in situ     now explanted  . Hypothyroidism   . CHF (congestive heart failure)     hx.  . Myocardial infarction     INFERIOR [phone] wth PTCA; Ant MI 1995 wth PTCA   . Shortness of breath     "only when external pacemaker wasn't working right" (06/17/2013)  . Arthritis     "a touch all over" (06/17/2013)  . COPD (chronic obstructive pulmonary disease)     "a touch" (8/4//2014)  . Atrial fibrillation, permanent 07/22/2013  . Biventricular cardiac pacemaker in situ 06/2013    St. Jude Allura (pt no longer  wished for ICD portion)  . Pacemaker   . CHB (complete heart block) 11/26/2014    Past Surgical History  Procedure Laterality Date  . Inguinal hernia repair Right   . Coronary artery bypass graft  11/01/1994    SVG to first diagonal, to distal RCA, to the ramus intermedius artery, and LIMA the LAD  . 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 Cataract extraction w/ intraocular lens  implant, bilateral Bilateral   . Icd lead removal Left 05/27/2013    Procedure: ICD LEAD REMOVAL;  Surgeon: Evans Lance, MD;  Location: [facility];  Service: Cardiovascular;  Laterality: Left;  . Bi-ventricular pacemaker insertion (crt-p) Right 06/17/2013    St Jude  . Pacemaker removal Left 05/2013  . Coronary angioplasty with stent placement  12/17/2001    to VG-diag wth Zeta stent  . Cardiac catheterization  2009    Patent LIMA-LAD; patent VG-diag wth mild in-stent restenosis; patent VG-ramus intemedius; patent VG-RCA wth 70% mid post lat stenosis  . Cardiac catheterization  03/16/2006  patent LIMA, patent VGs;  EF 30%  . Cardioversion  10/12/2010    successful cardioversion  . Coronary angioplasty  [phone]    LAD;Inf MI PTCA-RCA; ant MI PTCA- LAD  . Implantable cardioverter defibrillator (icd) generator change N/A 10/26/2012    Procedure: ICD GENERATOR CHANGE;  Surgeon: [name], MD;  Location: [facility] CATH LAB;  Service: Cardiovascular;  Laterality: N/A;  . Bi-ventricular pacemaker insertion N/A 06/17/2013    Procedure: BI-VENTRICULAR PACEMAKER INSERTION (CRT-P);  Surgeon: Evans Lance, MD;  Location: [facility] CATH LAB;  Service: Cardiovascular;  Laterality: N/A;     Current Outpatient Prescriptions  Medication Sig Dispense Refill  . allopurinol (ZYLOPRIM) 300 MG tablet Take 0.5 tablets (150 mg total) by  mouth daily. 30 tablet 6  . bimatoprost (LUMIGAN) 0.03 % ophthalmic solution Place 1 drop into both eyes at bedtime.     . budesonide-formoterol (SYMBICORT) 160-4.5 MCG/ACT inhaler Inhale 2 puffs into the lungs 2 (two) times daily. 1 Inhaler 5  . captopril (CAPOTEN) 12.5 MG tablet Take 0.5 tablets (6.25 mg total) by mouth 2 (two) times daily. 30 tablet 10  . digoxin (LANOXIN) 0.125 MG tablet Take 1 tablet (0.125 mg total) by mouth at bedtime. 90 tablet 3  . docusate sodium (COLACE) 100 MG capsule Take 300 mg by mouth at bedtime.     . dorzolamide-timolol (COSOPT) 22.3-6.8 MG/ML ophthalmic solution Place 1 drop into both eyes 2 (two) times daily.     Marland Kitchen FIBER PO Take 5 capsules by mouth at bedtime.    . furosemide (LASIX) 40 MG tablet Take 1 tablet (40 mg total) by mouth every evening. 90 tablet 3  . Iron-Vitamin C (VITRON-C PO) Take 1 tablet by mouth 2 (two) times daily.     Marland Kitchen levothyroxine (SYNTHROID, LEVOTHROID) 125 MCG tablet Take 125 mcg by mouth daily before breakfast.    . magnesium oxide (MAG-OX) 400 MG tablet Take 400 mg by mouth daily.    . metolazone (ZAROXOLYN) 2.5 MG tablet TAKE 2 TIMES A WEEK OR AS NEEDED TO KEEP WEIGHT DOWN TO LESS THAN 160 LBS. (Patient taking differently: TAKE 2 TIMES A WEEK OR AS NEEDED TO KEEP WEIGHT DOWN TO LESS THAN 153 LBS.) 30 tablet 4  . Omega 3-6-9 Fatty Acids (TRIPLE OMEGA-3-6-9) CAPS Take 1 capsule by mouth 2 (two) times daily.     . pantoprazole (PROTONIX) 40 MG tablet Take 1 tablet (40 mg total) by mouth daily. 30 tablet 11  . potassium chloride (K-DUR,KLOR-CON) 10 MEQ tablet Take 4 tablets (40 meq ) by mouth twice daily with an additional 10 meq by mouth twice daily on metolazone days or as needed 300 tablet 6  . warfarin (COUMADIN) 5 MG tablet Take 2.5-5 mg by mouth daily. Takes 1 tablet (5mg ) daily except 1/2 tablet (2.5 mg) every Tues and Sat.     No current facility-administered medications for this visit.    Allergies:   Chocolate flavor;  Ciprofloxacin; and Other    Social History:  The patient  reports that he has never smoked. He has never used smokeless tobacco. He reports that he drinks about 8.4 oz of alcohol per week. He reports that he does not use illicit drugs.   Family History:  The patient's family history includes Heart disease in his father.    ROS:  Please see the history of present illness.    Otherwise, review of systems positive for none.   All other systems are reviewed and negative.    PHYSICAL  EXAM: VS:  BP 106/72 mmHg  Pulse 72  Ht 5\' 11"  (1.803 m)  Wt 149 lb (67.586 kg)  BMI 20.79 kg/m2 , BMI Body mass index is 20.79 kg/(m^2).  General: Alert, oriented x3, no acute distress  Head: no evidence of trauma, PERRL, EOMI, no exophtalmos or lid lag, no myxedema, no xanthelasma; normal ears, nose and oropharynx  Neck: jugular venous pulsations are elevated 4-5 cm and there is prompt hepatojugular reflux; v waves are very prominent; brisk carotid pulses without delay and no carotid bruits  Chest: clear to auscultation, no signs of consolidation by percussion or palpation, normal fremitus, symmetrical and full respiratory excursions. Both the old device sites and the new CRT pacemaker site appeared healthy without evidence of infection  Cardiovascular: normal position and quality of the apical impulse, regular rhythm, normal first and paradoxically split second heart sounds, no rubs. S3 gallop present, 2 / 6 holosystolic murmur at the left lower sternal border  Abdomen: no tenderness or distention, no masses by palpation, no abnormal pulsatility or arterial bruits, normal bowel sounds, no hepatosplenomegaly  Extremities: no clubbing, cyanosis or edema; 2+ radial, ulnar and brachial pulses bilaterally; 2+ right femoral, posterior tibial and dorsalis pedis pulses; 2+ left femoral, posterior tibial and dorsalis pedis pulses; no subclavian or femoral bruits  Roughly 3 cm in diameter crusted yellowish  ulceration on the left anterior shin. There is erythema and swelling in the surrounding soft tissue, slight warmth Neurological: grossly nonfocal Psych: euthymic mood, full affect   EKG:  EKG is not ordered today.   Recent Labs: 08/17/2014: TSH 2.250 08/18/2014: Hemoglobin 11.4*; Platelets 134* 04/08/2015: ALT 17; BUN 53*; Creat 1.41*; Potassium 3.6; Sodium 134*    Lipid Panel    Component Value Date/Time   CHOL 152 12/13/2012 0950   TRIG 159* 12/13/2012 0950   HDL 28* 12/13/2012 0950   CHOLHDL 5.4 12/13/2012 0950   VLDL 32 12/13/2012 0950   LDLCALC 92 12/13/2012 0950      Wt Readings from Last 3 Encounters:  06/17/15 149 lb (67.586 kg)  04/27/15 151 lb 1.6 oz (68.539 kg)  03/02/15 154 lb 6.4 oz (70.035 kg)   .   ASSESSMENT AND PLAN:  Chronic combined systolic and diastolic congestive heart failure, NYHA class 3 Has narrow margin of compensation. Continue to try to shoot for a dry weight of under 153 pounds. He will take metolazone twice weekly, on Saturday (after eating out) and Tuesday. If his weight is under 146 pounds he will not take metolazone at all. If his weight is 146/150 pounds he will take 2.5 mg metolazone. If his weight is in excess of 150 pounds he will take 5 mg metolazone. Blood pressure prevents additional titration of ACE inhibitor.  Monthly Corvue downloads - enrolled in ICM.  Gout On allopurinol, no recent episodes.   Biventricular cardiac pacemaker in situ Normal device function. LV lead pacing configuration (mid3 to RV ring). This configuration has a relatively high pacing threshold, but appears to provide optimal resynchronization.  Atrial fibrillation, permanent Appropriate warfarin anticoagulation with therapeutic levels.   CAD- CABG X 4 '95, patent grafts '09 No angina symptoms to suggest coronary insufficiency at this time. Jaymon is not interested in any invasive procedures, so we decided not to pursue workup at this point in  time.    Current medicines are reviewed at length with the patient today.  The patient does not have concerns regarding medicines.  The following changes have been made:  no change  Labs/ tests ordered today include:  Orders Placed This Encounter  Procedures  . COMPLETE METABOLIC PANEL WITH GFR    Patient Instructions  Your physician wants you to follow-up in: 3 Months with Pacer Check. You will receive a reminder letter in the mail two months in advance. If you don't receive a letter, please call our office to schedule the follow-up appointment.  Your physician recommends that you return for lab work CMP       Signed, [name], MD  06/19/2015 9:11 PM    [name], MD, [facility] [phone] office [phone] pager

## 2015-07-01 LAB — CUP PACEART INCLINIC DEVICE CHECK
Battery Voltage: 2.98 V
Date Time Interrogation Session: 20160817145518
Lead Channel Impedance Value: 460 Ohm
Lead Channel Impedance Value: 580 Ohm
Lead Channel Pacing Threshold Amplitude: 1 V
Lead Channel Pacing Threshold Pulse Width: 0.4 ms
Lead Channel Pacing Threshold Pulse Width: 0.7 ms
Lead Channel Setting Pacing Amplitude: 2.5 V
Lead Channel Setting Pacing Amplitude: 2.5 V
Lead Channel Setting Pacing Pulse Width: 0.4 ms
Lead Channel Setting Pacing Pulse Width: 0.7 ms
MDC IDC MSMT BATTERY REMAINING PERCENTAGE: 95 % — AB
MDC IDC MSMT LEADCHNL LV PACING THRESHOLD AMPLITUDE: 1.25 V
MDC IDC PG MODEL: 3242
MDC IDC SET LEADCHNL RV SENSING SENSITIVITY: 6 mV
Pulse Gen Serial Number: 7515957

## 2015-07-08 ENCOUNTER — Ambulatory Visit (INDEPENDENT_AMBULATORY_CARE_PROVIDER_SITE_OTHER): Payer: Medicare Other | Admitting: Pharmacist Clinician (PhC)/ Clinical Pharmacy Specialist

## 2015-07-08 ENCOUNTER — Encounter: Payer: Self-pay | Admitting: Cardiovascular Disease

## 2015-07-08 DIAGNOSIS — I4821 Permanent atrial fibrillation: Secondary | ICD-10-CM

## 2015-07-08 DIAGNOSIS — I482 Chronic atrial fibrillation: Secondary | ICD-10-CM

## 2015-07-08 DIAGNOSIS — Z7901 Long term (current) use of anticoagulants: Secondary | ICD-10-CM

## 2015-07-08 DIAGNOSIS — I4891 Unspecified atrial fibrillation: Secondary | ICD-10-CM | POA: Diagnosis not present

## 2015-07-08 LAB — POCT INR: INR: 1.7

## 2015-07-09 ENCOUNTER — Encounter: Payer: Self-pay | Admitting: Cardiovascular Disease

## 2015-07-11 ENCOUNTER — Other Ambulatory Visit: Payer: Self-pay | Admitting: Pharmacist Clinician (PhC)/ Clinical Pharmacy Specialist

## 2015-07-22 ENCOUNTER — Ambulatory Visit (INDEPENDENT_AMBULATORY_CARE_PROVIDER_SITE_OTHER): Payer: Medicare Other | Admitting: Pharmacist Clinician (PhC)/ Clinical Pharmacy Specialist

## 2015-07-22 DIAGNOSIS — I482 Chronic atrial fibrillation: Secondary | ICD-10-CM | POA: Diagnosis not present

## 2015-07-22 DIAGNOSIS — I4891 Unspecified atrial fibrillation: Secondary | ICD-10-CM | POA: Diagnosis not present

## 2015-07-22 DIAGNOSIS — I4821 Permanent atrial fibrillation: Secondary | ICD-10-CM

## 2015-07-22 DIAGNOSIS — Z7901 Long term (current) use of anticoagulants: Secondary | ICD-10-CM | POA: Diagnosis not present

## 2015-07-22 LAB — POCT INR: INR: 2

## 2015-07-23 ENCOUNTER — Ambulatory Visit (INDEPENDENT_AMBULATORY_CARE_PROVIDER_SITE_OTHER): Payer: Medicare Other

## 2015-07-23 DIAGNOSIS — I5042 Chronic combined systolic (congestive) and diastolic (congestive) heart failure: Secondary | ICD-10-CM | POA: Diagnosis not present

## 2015-07-23 DIAGNOSIS — Z95810 Presence of automatic (implantable) cardiac defibrillator: Secondary | ICD-10-CM

## 2015-07-27 ENCOUNTER — Telehealth: Payer: Self-pay

## 2015-07-27 NOTE — Telephone Encounter (Signed)
ICM transmission received.  Attempted call to daughter, [name], at home and no answer. Call to cell phone and spoke with daughter.

## 2015-07-27 NOTE — Progress Notes (Signed)
EPIC Encounter for ICM Monitoring  Patient Name: [name] is a 81 y.o. male Date: 07/27/2015 Primary Care Physican: [name],[name] Primary Cardiologist: [name] Electrophysiologist: [name] Dry Weight: 145 lbs       In the past month, have you:  1. Gained more than 2 pounds in a day or more than 5 pounds in a week? no  2. Had changes in your medications (with verification of current medications)? no  3. Had more shortness of breath than is usual for you? no  4. Limited your activity because of shortness of breath? no  5. Not been able to sleep because of shortness of breath? no  6. Had increased swelling in your feet or ankles? no  7. Had symptoms of dehydration (dizziness, dry mouth, increased thirst, decreased urine output) no  8. Had changes in sodium restriction? no  9. Been compliant with medication? Yes   ICM trend: 07/23/2015   Follow-up plan: ICM clinic phone appointment 08/27/2015.  [facility]vue transmission trending along baseline.  Spoke with daughter [name].  She stated patient is doing well and has no complaints at this time.  No changes today.  Copy of note sent to patient's primary care physician, primary cardiologist, and device following physician.  Rosalene Billings, RN, CCM 07/27/2015 1:34 PM

## 2015-07-30 ENCOUNTER — Ambulatory Visit (INDEPENDENT_AMBULATORY_CARE_PROVIDER_SITE_OTHER): Payer: Medicare Other | Admitting: Podiatry

## 2015-07-30 DIAGNOSIS — M79675 Pain in left toe(s): Secondary | ICD-10-CM

## 2015-07-30 DIAGNOSIS — B351 Tinea unguium: Secondary | ICD-10-CM | POA: Diagnosis not present

## 2015-07-30 DIAGNOSIS — M79674 Pain in right toe(s): Secondary | ICD-10-CM | POA: Diagnosis not present

## 2015-07-30 NOTE — Progress Notes (Signed)
Patient ID: [name], male   DOB: 11/24/1925, 81 y.o.   MRN: [id number] Complaint:  Visit Type: Patient returns to my office for continued preventative foot care services. Complaint: Patient states" my nails have grown long and thick and become painful to walk and wear shoes"  The patient presents for preventative foot care services. No changes to ROS  Podiatric Exam: Vascular: dorsalis pedis and posterior tibial pulses are palpable bilateral. Capillary return is immediate. Temperature gradient is WNL. Skin turgor WNL  Sensorium: Normal Semmes Weinstein monofilament test. Normal tactile sensation bilaterally. Nail Exam: Pt has thick disfigured discolored nails with subungual debris noted bilateral entire nail hallux through fifth toenails Ulcer Exam: There is no evidence of ulcer or pre-ulcerative changes or infection. Orthopedic Exam: Muscle tone and strength are WNL. No limitations in general ROM. No crepitus or effusions noted. Foot type and digits show no abnormalities. Bony prominences are unremarkable. Skin: No Porokeratosis. No infection or ulcers  Diagnosis:  Onychomycosis, , Pain in right toe, pain in left toes  Treatment & Plan Procedures and Treatment: Consent by patient was obtained for treatment procedures. The patient understood the discussion of treatment and procedures well. All questions were answered thoroughly reviewed. Debridement of mycotic and hypertrophic toenails, 1 through 5 bilateral and clearing of subungual debris. No ulceration, no infection noted.  Return Visit-Office Procedure: Patient instructed to return to the office for a follow up visit 3 months for continued evaluation and treatment.

## 2015-08-18 ENCOUNTER — Encounter: Payer: Self-pay | Admitting: Cardiovascular Disease

## 2015-08-19 ENCOUNTER — Ambulatory Visit (INDEPENDENT_AMBULATORY_CARE_PROVIDER_SITE_OTHER): Payer: Medicare Other | Admitting: Pharmacist Clinician (PhC)/ Clinical Pharmacy Specialist

## 2015-08-19 DIAGNOSIS — I4891 Unspecified atrial fibrillation: Secondary | ICD-10-CM

## 2015-08-19 DIAGNOSIS — I4821 Permanent atrial fibrillation: Secondary | ICD-10-CM

## 2015-08-19 DIAGNOSIS — Z7901 Long term (current) use of anticoagulants: Secondary | ICD-10-CM | POA: Diagnosis not present

## 2015-08-19 DIAGNOSIS — I482 Chronic atrial fibrillation: Secondary | ICD-10-CM | POA: Diagnosis not present

## 2015-08-19 LAB — POCT INR: INR: 1.8

## 2015-08-21 ENCOUNTER — Telehealth: Payer: Self-pay

## 2015-08-21 NOTE — Telephone Encounter (Signed)
I agree. Take the metolazone as recommended, call over weekend if that does not work

## 2015-08-21 NOTE — Telephone Encounter (Signed)
Attempted return call to daughter [name] and left message to call back.    Received voice mail message from daughter [name] asking if a transmission can be done today instead of next week.  She reported patient may be developing fluid retention.

## 2015-08-21 NOTE — Telephone Encounter (Signed)
Received call from [name], patients daughter for ICM follow up due to patients weight gain and leg swelling. . She stated patient's weight is up to 148-149 and has been fluctuating several pounds over the last few weeks.  She stated he can be up 2-3 pounds overnight and his legs are slightly swollen.  Metolazone can be taken on Tuesday and Saturdays and is base off his weight.  Advised to follow her directions for the Metolazone and to call if the leg swelling and weight gain does not resolve.  She had tried to have patient send a transmission today but he was unable to operate the monitor.  She will be at his house on Sunday and will send a transmission.   Advised I would send note to Dr Sallyanne Kuster as an Juluis Rainier and if he has any recommendations, I would call her back.

## 2015-08-24 ENCOUNTER — Ambulatory Visit (INDEPENDENT_AMBULATORY_CARE_PROVIDER_SITE_OTHER): Payer: Medicare Other

## 2015-08-24 ENCOUNTER — Telehealth: Payer: Self-pay

## 2015-08-24 DIAGNOSIS — I5042 Chronic combined systolic (congestive) and diastolic (congestive) heart failure: Secondary | ICD-10-CM

## 2015-08-24 DIAGNOSIS — Z95810 Presence of automatic (implantable) cardiac defibrillator: Secondary | ICD-10-CM

## 2015-08-24 NOTE — Telephone Encounter (Signed)
Spoke with daughter. See ICM note

## 2015-08-24 NOTE — Telephone Encounter (Signed)
Received call from daughter, [name] she stated she sent transmission yesterday and I advised her I did not receive it. She stated her sister was still at her fathers house and asked if I could call to help get a transmission sent.  Daughter is Lattie Haw at [phone].  She stated patient was slightly confused on Saturday but is clear today.  She stated his weight on Saturday was 152.8 and he had gained 3 lbs in a day and took 2 Metolazone pills as instructed for weight > 150.  She stated it dropped his weight to 149 on yesterday, 08/23/2015, and he was given 1 Metolzaone on Sunday morning and is weight today is 148 lbs.  She stated he has leg fluid.  I advised he may need to have an appointment made for follow up but she wanted a transmission sent today.     Call to [facility] and attempted to troubleshoot [employer] Monitor but unable to get transmission sent.  Provided [employer] and she was going to call.  Will check if transmission gets sent.

## 2015-08-24 NOTE — Progress Notes (Signed)
Cardiology Office Note   Date:  08/25/2015   ID:  [name], DOB Feb 17, 1926, MRN [id number]  PCP:  [name],[name]  Cardiologist:  Dr. [name]   Electrophysiologist:  n/a  No chief complaint on file.    History of Present Illness: [name] is a 81 y.o. male with a hx of ischemic CM, systolic HF, CAD s/p CABG, CHB s/p CRT-P, chronic AFib, HTN.  Last seen by Dr. Dani Gobble [name] 8/16.   When last seen, it was noted the patient did not want any invasive procedures.    He contacted the [facility] clinic yesterday with 3 lbs weight gain, increased swelling.  Corevue was checked and did not suggest significant trend above baseline.  He was added on for further evaluation.    He is here with his daughter and caregiver today.  His weights have been pretty stable until late September.  He started to have sudden weight gain requiring extra doses of Metolazone on several occasions since.  He took a dose of 5 mg and then a 2nd dose of 2.5 mg over this past weekend. He took his usual dose today.  His LE edema is unchanged with taking extra Metolazone.  His R leg is > than his L.  He denies significant changes in his breathing.  He is NYHA 2b-3.  Denies chest pain, syncope, orthopnea, PND.  No cough, wheezing. No bleeding issues.    Studies/Reports Reviewed Today:  Echo 12/27/13 EF 25-30%, diff HK, anteroseptal and apical DK, inf-lat HK, mild AI, mild to mod MR, MAC, severe LAE, mild RVE, mildly reduced RVSF, severe RAE, mod to severe TR, PASP 31 mmHg  LHC 1/09 LM:  Ok LAD:   totally occluded.  LCx: occluded RCA:  Occluded PLA: 70% midvessel lesion whichis unchanged. LIMA-LAD patent SVG-diagonal patent with 30% ISR SVG-R eye patent SVG-RCA patent EF 25% with global hypokinesis.  Myoview 6/06 Ant-apical, septal, inf scar; no ischemia, EF 35%   Past Medical History  Diagnosis Date  . Ischemic cardiomyopathy     EF-35-45%by echo in 2011; first dx in the 90's but again in 2009  & rec'd 1st BiV ICD   . Paroxysmal atrial fibrillation (HCC)     sinus rhythm on amiodarone  . Asbestos exposure     plaques on CT  . Compression fracture     T7  . Hypertension   . Coronary artery disease     last cath 2009, hx CABG  . Automatic implantable cardioverter-defibrillator in situ     now explanted  . Hypothyroidism   . CHF (congestive heart failure) (HCC)     hx.  . Myocardial infarction [facility])     INFERIOR IR-6789 wth PTCA; Ant MI 1995 wth PTCA   . Shortness of breath     "only when external pacemaker wasn't working right" (06/17/2013)  . Arthritis     "a touch all over" (06/17/2013)  . COPD (chronic obstructive pulmonary disease) ([place])     "a touch" (8/4//2014)  . Atrial fibrillation, permanent ([place]) 07/22/2013  . Biventricular cardiac pacemaker in situ 06/2013    St. Jude Allura (pt no longer wished for ICD portion)  . Pacemaker   . CHB (complete heart block) ([place]) 11/26/2014    Past Surgical History  Procedure Laterality Date  . Inguinal hernia repair Right   . Coronary artery bypass graft  11/01/1994    SVG to first diagonal, to distal RCA, to the ramus intermedius artery, and LIMA the  LAD  . 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 Cataract extraction w/ intraocular lens  implant, bilateral Bilateral   . Icd lead removal Left 05/27/2013    Procedure: ICD LEAD REMOVAL;  Surgeon: Evans Lance, MD;  Location: [place];  Service: Cardiovascular;  Laterality: Left;  . Bi-ventricular pacemaker insertion (crt-p) Right 06/17/2013    St Jude  . Pacemaker removal Left 05/2013  . Coronary angioplasty with stent placement  12/17/2001    to VG-diag wth Zeta stent  . Cardiac catheterization  2009    Patent LIMA-LAD; patent VG-diag wth mild in-stent restenosis; patent VG-ramus  intemedius; patent VG-RCA wth 70% mid post lat stenosis  . Cardiac catheterization  03/16/2006    patent LIMA, patent VGs;  EF 30%  . Cardioversion  10/12/2010    successful cardioversion  . Coronary angioplasty  [id number]    LAD;Inf MI PTCA-RCA; ant MI PTCA- LAD  . Implantable cardioverter defibrillator (icd) generator change N/A 10/26/2012    Procedure: ICD GENERATOR CHANGE;  Surgeon: [name], MD;  Location: [facility] CATH LAB;  Service: Cardiovascular;  Laterality: N/A;  . Bi-ventricular pacemaker insertion N/A 06/17/2013    Procedure: BI-VENTRICULAR PACEMAKER INSERTION (CRT-P);  Surgeon: Evans Lance, MD;  Location: [facility] CATH LAB;  Service: Cardiovascular;  Laterality: N/A;     Current Outpatient Prescriptions  Medication Sig Dispense Refill  . allopurinol (ZYLOPRIM) 300 MG tablet Take 0.5 tablets (150 mg total) by mouth daily. 30 tablet 6  . bimatoprost (LUMIGAN) 0.03 % ophthalmic solution Place 1 drop into both eyes at bedtime.     . budesonide-formoterol (SYMBICORT) 160-4.5 MCG/ACT inhaler Inhale 2 puffs into the lungs 2 (two) times daily. 1 Inhaler 5  . captopril (CAPOTEN) 12.5 MG tablet Take 0.5 tablets (6.25 mg total) by mouth 2 (two) times daily. 30 tablet 10  . digoxin (LANOXIN) 0.125 MG tablet Take 1 tablet (0.125 mg total) by mouth at bedtime. 90 tablet 3  . docusate sodium (COLACE) 100 MG capsule Take 300 mg by mouth at bedtime.     . dorzolamide-timolol (COSOPT) 22.3-6.8 MG/ML ophthalmic solution Place 1 drop into both eyes 2 (two) times daily.     Marland Kitchen FIBER PO Take 5 capsules by mouth at bedtime.    . furosemide (LASIX) 40 MG tablet Take 1 tablet (40 mg total) by mouth every evening. 90 tablet 3  . furosemide (LASIX) 80 MG tablet Take 1 tablet (80 mg total) by mouth daily. 60 tablet 11  . Iron-Vitamin C (VITRON-C PO) Take 1 tablet by mouth 2 (two) times daily.     Marland Kitchen levothyroxine (SYNTHROID, LEVOTHROID) 125 MCG tablet Take 125 mcg by mouth daily before breakfast.    .  LUMIGAN 0.01 % SOLN PLACE 1 DROP INTO BOTH EYES AT BEDTIME  4  . magnesium oxide (MAG-OX) 400 MG tablet Take 400 mg by mouth daily.    . metolazone (ZAROXOLYN) 2.5 MG tablet TAKE 2 TIMES A WEEK OR AS NEEDED TO KEEP WEIGHT DOWN TO LESS THAN 160 LBS. (Patient taking differently: TAKE 2 TIMES A WEEK OR AS NEEDED TO KEEP WEIGHT DOWN TO LESS THAN 153 LBS.) 30 tablet 4  . Omega 3-6-9 Fatty Acids (TRIPLE OMEGA-3-6-9) CAPS Take 1 capsule by mouth 2 (two) times daily.     Marland Kitchen  pantoprazole (PROTONIX) 40 MG tablet Take 1 tablet (40 mg total) by mouth daily. 30 tablet 11  . potassium chloride (K-DUR,KLOR-CON) 10 MEQ tablet Take 5 tablets (50 meq ) in the AM and 40 meq in the PM; take extra 10 meq on metolazone days 260 tablet 6  . warfarin (COUMADIN) 5 MG tablet Take 2.5-5 mg by mouth daily. Takes 1 tablet (5mg ) daily except 1/2 tablet (2.5 mg) every Tues and Sat.    . warfarin (COUMADIN) 5 MG tablet TAKE 1 TABLET BY MOUTH DAILY AS DIRECTED 90 tablet 1   No current facility-administered medications for this visit.    Allergies:   Chocolate flavor; Ciprofloxacin; and Other    Social History:  The patient  reports that he has never smoked. He has never used smokeless tobacco. He reports that he drinks about 8.4 oz of alcohol per week. He reports that he does not use illicit drugs.   Family History:  The patient's family history includes Heart disease in his father.    ROS:   Please see the history of present illness.   Review of Systems  Cardiovascular: Positive for leg swelling.  All other systems reviewed and are negative.     PHYSICAL EXAM: VS:  BP 112/64 mmHg  Pulse 75  Ht 5\' 11"  (1.803 m)  Wt 152 lb 1.9 oz (69.001 kg)  BMI 21.23 kg/m2  SpO2 98%    Wt Readings from Last 3 Encounters:  08/25/15 152 lb 1.9 oz (69.001 kg)  06/17/15 149 lb (67.586 kg)  04/27/15 151 lb 1.6 oz (68.539 kg)     GEN: Well nourished, well developed, in no acute distress HEENT: normal Neck:  JVP 10 cm,  no  masses Cardiac:  Normal W2/X9, RRR; 2/6 systolic murmur LSB,  no rubs or gallops, 1+ RLE edema, tight trace RLE edema Respiratory:  Decreased breath sounds bilaterally, no wheezing, rhonchi or rales. GI: soft, nontender,  Somewhat distended, + BS MS: no deformity or atrophy Skin: warm and dry  Neuro:  CNs II-XII intact, Strength and sensation are intact Psych: Normal affect   EKG:  EKG is ordered today.  It demonstrates:   V paced HR 75   Recent Labs: 04/08/2015: ALT 17; BUN 53*; Creat 1.41*; Potassium 3.6; Sodium 134*    Lipid Panel    Component Value Date/Time   CHOL 152 12/13/2012 0950   TRIG 159* 12/13/2012 0950   HDL 28* 12/13/2012 0950   CHOLHDL 5.4 12/13/2012 0950   VLDL 32 12/13/2012 0950   LDLCALC 92 12/13/2012 0950      ASSESSMENT AND PLAN:  1. Acute on Chronic Systolic CHF:  Weight has been trending up and his edema has increased.  His CoreVue measurements do not correlate with volume excess.  However, his exam is indicative of volume excess.  He is not eating extra salt or drinking too much fluid.  His target weight is 146.    -  Labs: BMET, BNP, CBC  -  Increase Lasix to 80 mg bid  -  Increase K+ to 50 mEq in A and 40 mEq in P  -  BMET in 1 week  -  Take extra dose of Metolazone tomorrow (2.5 mg).  If weight under 146, will not give extra Metolazone.  -  Close FU next week.  2. Ischemic CM:  Continue ACE inhibitor, Digoxin.  3. CAD s/p CABG:  No angina.  He is not on ASA as he is on Coumadin.  4. S/p  CRT-P:  FU with EP as planned.  5. Chronic Atrial Fibrillation:  Rate controlled.  Continue Coumadin.  6. CKD:  Will keep close eye on renal function and K+ with adjustments in diuretics.       Medication Changes: Current medicines are reviewed at length with the patient today.  Concerns regarding medicines are as outlined above.  The following changes have been made:   Discontinued Medications   No medications on file   Modified Medications    Modified Medication Previous Medication   FUROSEMIDE (LASIX) 80 MG TABLET furosemide (LASIX) 80 MG tablet      Take 1 tablet (80 mg total) by mouth daily.    Take 80 mg by mouth daily.    POTASSIUM CHLORIDE (K-DUR,KLOR-CON) 10 MEQ TABLET potassium chloride (K-DUR,KLOR-CON) 10 MEQ tablet      Take 5 tablets (50 meq ) in the AM and 40 meq in the PM; take extra 10 meq on metolazone days    Take 4 tablets (40 meq ) by mouth twice daily with an additional 10 meq by mouth twice daily on metolazone days or as needed   New Prescriptions   No medications on file   Labs/ tests ordered today include:   Orders Placed This Encounter  Procedures  . Basic Metabolic Panel (BMET)  . CBC w/Diff  . B Nat Peptide  . Basic Metabolic Panel (BMET)  . EKG 12-Lead      Disposition:    FU with Dr. Dani Gobble [name] or PA/NP in 1 week.     Signed, Versie Starks, MHS 08/25/2015 4:54 PM    [facility] [address], [place], [place]  [address] Phone: [phone]; Fax: [phone]

## 2015-08-24 NOTE — Progress Notes (Signed)
EPIC Encounter for ICM Monitoring  Patient Name: [name] is a 81 y.o. male Date: 08/24/2015 Primary Care Physican: [name],[name] Primary Cardiologist: [name] Electrophysiologist: [name] Dry Weight: 148 lb       In the past month, have you:  1. Gained more than 2 pounds in a day or more than 5 pounds in a week? Yes, gain of 3 lbs overnight.  Dry weight is normally ~144-145 lbs.  His weight on Saturday 08/22/2015 was 152.8  2. Had changes in your medications (with verification of current medications)? No  3. Had more shortness of breath than is usual for you? no  4. Limited your activity because of shortness of breath? no  5. Not been able to sleep because of shortness of breath? no  6. Had increased swelling in your feet or ankles? Yes, in legs and feet  7. Had symptoms of dehydration (dizziness, dry mouth, increased thirst, decreased urine output) no  8. Had changes in sodium restriction? no  9. Been compliant with medication? Yes   ICM trend: 08/24/2015   Follow-up plan: ICM clinic phone appointment on 09/17/2015.  Repeat transmission sent at the request of patients daughter, [name] due to patient is having weight gain and leg swelling (slight indentation when pressed) .  She stated patient received Metolazone 2.5mg  - 2 tablets as prescribed on Saturday, 08/22/2015, due to his weight was 152.8 (he takes Metolazone if wt is >150lbs) and 3 lbs was overnight.  She stated the weight dropped to 149lb and he was given extra Metolazone 2.5mg  on Sunday, 08/23/2015, and weight dropped to 148lbs.  Daughter stated this weight is still high for him.  She stated patient tends to worsen rapidly and that is her concern.     Corvue transmission revealed he is trending along baseline and does not appear to correlate with the symptoms the patient is having.     Recommended to make an appointment with Dr Sallyanne Kuster for follow up since he is symptomatic.  Advised I would check with the office  scheduler for appointments available and call her back.    Call back to daughter and informed her no appointments available at the [facility] office and informed her there is an appointment with Richardson Dopp PA-C, 08/25/2015 at 11:00am at the [facility] office.  She stated that would be fine.   Copy of note sent to patient's primary care physician, primary cardiologist, and device following physician.  Rosalene Billings, RN, CCM 08/24/2015 1:47 PM

## 2015-08-25 ENCOUNTER — Ambulatory Visit (INDEPENDENT_AMBULATORY_CARE_PROVIDER_SITE_OTHER): Payer: Medicare Other | Admitting: Physician Assistant

## 2015-08-25 ENCOUNTER — Encounter: Payer: Self-pay | Admitting: Physician Assistant

## 2015-08-25 VITALS — BP 112/64 | HR 75 | Ht 71.0 in | Wt 152.1 lb

## 2015-08-25 DIAGNOSIS — I2510 Atherosclerotic heart disease of native coronary artery without angina pectoris: Secondary | ICD-10-CM | POA: Diagnosis not present

## 2015-08-25 DIAGNOSIS — R600 Localized edema: Secondary | ICD-10-CM

## 2015-08-25 DIAGNOSIS — I5023 Acute on chronic systolic (congestive) heart failure: Secondary | ICD-10-CM | POA: Diagnosis not present

## 2015-08-25 DIAGNOSIS — N183 Chronic kidney disease, stage 3 (moderate): Secondary | ICD-10-CM

## 2015-08-25 DIAGNOSIS — I5043 Acute on chronic combined systolic (congestive) and diastolic (congestive) heart failure: Secondary | ICD-10-CM

## 2015-08-25 DIAGNOSIS — I255 Ischemic cardiomyopathy: Secondary | ICD-10-CM | POA: Diagnosis not present

## 2015-08-25 DIAGNOSIS — Z950 Presence of cardiac pacemaker: Secondary | ICD-10-CM

## 2015-08-25 DIAGNOSIS — I482 Chronic atrial fibrillation, unspecified: Secondary | ICD-10-CM

## 2015-08-25 LAB — BASIC METABOLIC PANEL
BUN: 44 mg/dL — AB (ref 7–25)
CHLORIDE: 88 mmol/L — AB (ref 98–110)
CO2: 35 mmol/L — AB (ref 20–31)
CREATININE: 1.19 mg/dL — AB (ref 0.70–1.11)
Calcium: 9.4 mg/dL (ref 8.6–10.3)
Glucose, Bld: 70 mg/dL (ref 65–99)
POTASSIUM: 3.3 mmol/L — AB (ref 3.5–5.3)
Sodium: 134 mmol/L — ABNORMAL LOW (ref 135–146)

## 2015-08-25 LAB — CBC WITH DIFFERENTIAL/PLATELET
BASOS ABS: 0 10*3/uL (ref 0.0–0.1)
Basophils Relative: 1 % (ref 0–1)
EOS ABS: 0 10*3/uL (ref 0.0–0.7)
EOS PCT: 1 % (ref 0–5)
HEMATOCRIT: 36.4 % — AB (ref 39.0–52.0)
Hemoglobin: 12.7 g/dL — ABNORMAL LOW (ref 13.0–17.0)
LYMPHS ABS: 0.6 10*3/uL — AB (ref 0.7–4.0)
LYMPHS PCT: 14 % (ref 12–46)
MCH: 31.5 pg (ref 26.0–34.0)
MCHC: 34.9 g/dL (ref 30.0–36.0)
MCV: 90.3 fL (ref 78.0–100.0)
MONOS PCT: 12 % (ref 3–12)
MPV: 10.1 fL (ref 8.6–12.4)
Monocytes Absolute: 0.5 10*3/uL (ref 0.1–1.0)
Neutro Abs: 3 10*3/uL (ref 1.7–7.7)
Neutrophils Relative %: 72 % (ref 43–77)
PLATELETS: 113 10*3/uL — AB (ref 150–400)
RBC: 4.03 MIL/uL — ABNORMAL LOW (ref 4.22–5.81)
RDW: 15.9 % — AB (ref 11.5–15.5)
WBC: 4.2 10*3/uL (ref 4.0–10.5)

## 2015-08-25 MED ORDER — FUROSEMIDE 80 MG PO TABS: 80.0000 mg | ORAL_TABLET | Freq: Every day | ORAL | Status: AC

## 2015-08-25 MED ORDER — POTASSIUM CHLORIDE CRYS ER 10 MEQ PO TBCR: EXTENDED_RELEASE_TABLET | ORAL | Status: DC

## 2015-08-25 NOTE — Patient Instructions (Signed)
Medication Instructions:  1. INCREASE POTASSIUM TO 50 MEQ IN THE MORNING AND 40 MEQ IN  THE PM  2. INCREASE LASIX TO 80 MG TWICE DAILY  Labwork: 1. TODAY BMET, CBC W/DIFF, BNP  2. IN 1 WEEK REPEAT BMET  Testing/Procedures: NONE  Follow-Up: YOU WILL NEED TO FOLLOW UP WITH DR. Sallyanne Kuster 10/19 OR HIS PA/NP THAT SAME DAY HE IS IN THE OFFICE; YOU HAVE A COUMADIN APPT THAT DAY AS WELL ALREADY SCHEDULED   Any Other Special Instructions Will Be Listed Below (If Applicable). 1. TAKE METOLAZONE 2.5 MG TOMORROW ON 08/26/15 ONLY IF YOUR WEIGHT IS NOT BACK TO BASELINE 146 LB'S; IF YOUR WEIGHT IS 146 OR BELOW TOMORROW MORNING THEN DO NOT TAKE THE METOLAZONE   2. AFTER 08/26/15 YOU WILL RESUME YOUR REGULAR DOSE OF METOLAZONE

## 2015-08-26 ENCOUNTER — Telehealth: Payer: Self-pay | Admitting: *Deleted

## 2015-08-26 DIAGNOSIS — R600 Localized edema: Secondary | ICD-10-CM

## 2015-08-26 DIAGNOSIS — I5023 Acute on chronic systolic (congestive) heart failure: Secondary | ICD-10-CM

## 2015-08-26 DIAGNOSIS — I255 Ischemic cardiomyopathy: Secondary | ICD-10-CM

## 2015-08-26 DIAGNOSIS — E876 Hypokalemia: Secondary | ICD-10-CM

## 2015-08-26 LAB — BRAIN NATRIURETIC PEPTIDE: Brain Natriuretic Peptide: 401.8 pg/mL — ABNORMAL HIGH (ref 0.0–100.0)

## 2015-08-26 MED ORDER — POTASSIUM CHLORIDE CRYS ER 10 MEQ PO TBCR: EXTENDED_RELEASE_TABLET | ORAL | Status: DC

## 2015-08-26 NOTE — Telephone Encounter (Signed)
DPR for daughter [name] who has been notified of lab results. Advised need to increase K+ to 60 meq BID today only; as of 10/13 change K+ to 50 meq BID. BMET 10/14. Daughter agreeable to plan of car and said she will let [facility] know of change.

## 2015-08-28 ENCOUNTER — Other Ambulatory Visit (INDEPENDENT_AMBULATORY_CARE_PROVIDER_SITE_OTHER): Payer: Medicare Other | Admitting: *Deleted

## 2015-08-28 DIAGNOSIS — R600 Localized edema: Secondary | ICD-10-CM

## 2015-08-28 DIAGNOSIS — E876 Hypokalemia: Secondary | ICD-10-CM

## 2015-08-28 DIAGNOSIS — I255 Ischemic cardiomyopathy: Secondary | ICD-10-CM

## 2015-08-28 DIAGNOSIS — I5023 Acute on chronic systolic (congestive) heart failure: Secondary | ICD-10-CM

## 2015-08-28 LAB — BASIC METABOLIC PANEL
BUN: 53 mg/dL — ABNORMAL HIGH (ref 7–25)
CALCIUM: 9.4 mg/dL (ref 8.6–10.3)
CO2: 31 mmol/L (ref 20–31)
Chloride: 90 mmol/L — ABNORMAL LOW (ref 98–110)
Creat: 1.45 mg/dL — ABNORMAL HIGH (ref 0.70–1.11)
Glucose, Bld: 111 mg/dL — ABNORMAL HIGH (ref 65–99)
Potassium: 3.1 mmol/L — ABNORMAL LOW (ref 3.5–5.3)
SODIUM: 134 mmol/L — AB (ref 135–146)

## 2015-08-31 ENCOUNTER — Telehealth: Payer: Self-pay | Admitting: *Deleted

## 2015-08-31 DIAGNOSIS — R600 Localized edema: Secondary | ICD-10-CM

## 2015-08-31 DIAGNOSIS — E876 Hypokalemia: Secondary | ICD-10-CM

## 2015-08-31 DIAGNOSIS — I5023 Acute on chronic systolic (congestive) heart failure: Secondary | ICD-10-CM

## 2015-08-31 DIAGNOSIS — I255 Ischemic cardiomyopathy: Secondary | ICD-10-CM

## 2015-08-31 MED ORDER — POTASSIUM CHLORIDE CRYS ER 10 MEQ PO TBCR: EXTENDED_RELEASE_TABLET | ORAL | Status: DC

## 2015-08-31 NOTE — Telephone Encounter (Signed)
No answer, I will try later to reach daughter for pt's lab results and med changes.

## 2015-08-31 NOTE — Telephone Encounter (Signed)
Pt's daughter [name] has been notified of lab results and med changes. Increase K+ 60 meq BID, increase extra K+ to 20 meq on metolazone days. BMET 10/19 at appt. Daughter [name] verbalized understanding to plan of care.

## 2015-09-01 ENCOUNTER — Other Ambulatory Visit: Payer: Medicare Other

## 2015-09-01 NOTE — Progress Notes (Signed)
Cardiology Office Note   Date:  09/02/2015   ID:  [name], DOB 11/28/1925, MRN [id number]  PCP:  [name],Nealy Hickmon  Cardiologist:  Dr. [name]   Electrophysiologist:  n/a  Chief Complaint  Patient presents with  . Follow-up  . Congestive Heart Failure     History of Present Illness: [name] is a 81 y.o. male with a hx of ischemic CM, systolic HF, CAD s/p CABG, CHB s/p CRT-P, chronic AFib, HTN.  Last seen by Dr. Dani Gobble [name] 8/16.   When last seen, it was noted the patient did not want any invasive procedures.    I saw him last week after he contacted the [facility] clinic with 3 lbs weight gain, increased swelling.  Corevue was checked and did not suggest significant trend above baseline.  His daughter was with him and she keeps a close eye on his weights. They had been trending up as well as his edema. I adjusted his diuretics further. He returns for close follow-up.  His weight responded to the increased dose of Lasix but it has crept back up again. He really denies significant dyspnea.  He was quite active this past weekend.  He denies orthopnea, PND.  He denies chest pain.  His daughter notes that his LE edema is not really responding much to the increased dose of diuretics.     Studies/Reports Reviewed Today:  Echo 12/27/13 EF 25-30%, diff HK, anteroseptal and apical DK, inf-lat HK, mild AI, mild to mod MR, MAC, severe LAE, mild RVE, mildly reduced RVSF, severe RAE, mod to severe TR, PASP 31 mmHg  LHC 1/09 LM:  Ok LAD:   totally occluded.  LCx: occluded RCA:  Occluded PLA: 70% midvessel lesion whichis unchanged. LIMA-LAD patent SVG-diagonal patent with 30% ISR SVG-RI patent SVG-RCA patent EF 25% with global hypokinesis.  Myoview 6/06 Ant-apical, septal, inf scar; no ischemia, EF 35%   Past Medical History  Diagnosis Date  . Ischemic cardiomyopathy     EF-35-45%by echo in 2011; first dx in the 90's but again in 2009 & rec'd 1st BiV ICD   .  Paroxysmal atrial fibrillation (HCC)     sinus rhythm on amiodarone  . Asbestos exposure     plaques on CT  . Compression fracture     T7  . Hypertension   . Coronary artery disease     last cath 2009, hx CABG  . Automatic implantable cardioverter-defibrillator in situ     now explanted  . Hypothyroidism   . CHF (congestive heart failure) (HCC)     hx.  . Myocardial infarction [facility])     INFERIOR [id number] wth PTCA; Ant MI 1995 wth PTCA   . Shortness of breath     "only when external pacemaker wasn't working right" (06/17/2013)  . Arthritis     "a touch all over" (06/17/2013)  . COPD (chronic obstructive pulmonary disease) ([place])     "a touch" (8/4//2014)  . Atrial fibrillation, permanent ([place]) 07/22/2013  . Biventricular cardiac pacemaker in situ 06/2013    St. Jude Allura (pt no longer wished for ICD portion)  . Pacemaker   . CHB (complete heart block) ([place]) 11/26/2014    Past Surgical History  Procedure Laterality Date  . Inguinal hernia repair Right   . Coronary artery bypass graft  11/01/1994    SVG to first diagonal, to distal RCA, to the ramus intermedius artery, and LIMA the LAD  . 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 Cataract extraction w/ intraocular lens  implant, bilateral Bilateral   . Icd lead removal Left 05/27/2013    Procedure: ICD LEAD REMOVAL;  Surgeon: Evans Lance, MD;  Location: [place];  Service: Cardiovascular;  Laterality: Left;  . Bi-ventricular pacemaker insertion (crt-p) Right 06/17/2013    St Jude  . Pacemaker removal Left 05/2013  . Coronary angioplasty with stent placement  12/17/2001    to VG-diag wth Zeta stent  . Cardiac catheterization  2009    Patent LIMA-LAD; patent VG-diag wth mild in-stent restenosis; patent VG-ramus intemedius; patent VG-RCA wth 70%  mid post lat stenosis  . Cardiac catheterization  03/16/2006    patent LIMA, patent VGs;  EF 30%  . Cardioversion  10/12/2010    successful cardioversion  . Coronary angioplasty  [phone]    LAD;Inf MI PTCA-RCA; ant MI PTCA- LAD  . Implantable cardioverter defibrillator (icd) generator change N/A 10/26/2012    Procedure: ICD GENERATOR CHANGE;  Surgeon: [name], MD;  Location: [facility] CATH LAB;  Service: Cardiovascular;  Laterality: N/A;  . Bi-ventricular pacemaker insertion N/A 06/17/2013    Procedure: BI-VENTRICULAR PACEMAKER INSERTION (CRT-P);  Surgeon: Evans Lance, MD;  Location: [facility] C[facility] LAB;  Service: Cardiovascular;  Laterality: N/A;     Current Outpatient Prescriptions  Medication Sig Dispense Refill  . allopurinol (ZYLOPRIM) 300 MG tablet Take 0.5 tablets (150 mg total) by mouth daily. 30 tablet 6  . bimatoprost (LUMIGAN) 0.03 % ophthalmic solution Place 1 drop into both eyes at bedtime.     . budesonide-formoterol (SYMBICORT) 160-4.5 MCG/ACT inhaler Inhale 2 puffs into the lungs 2 (two) times daily. 1 Inhaler 5  . captopril (CAPOTEN) 12.5 MG tablet Take 0.5 tablets (6.25 mg total) by mouth 2 (two) times daily. 30 tablet 10  . digoxin (LANOXIN) 0.125 MG tablet Take 1 tablet (0.125 mg total) by mouth at bedtime. 90 tablet 3  . docusate sodium (COLACE) 100 MG capsule Take 300 mg by mouth at bedtime.     . dorzolamide-timolol (COSOPT) 22.3-6.8 MG/ML ophthalmic solution Place 1 drop into both eyes 2 (two) times daily.     Marland Kitchen FIBER PO Take 5 capsules by mouth at bedtime.    . furosemide (LASIX) 40 MG tablet Take 1 tablet (40 mg total) by mouth every evening. 90 tablet 3  . furosemide (LASIX) 80 MG tablet Take 1 tablet (80 mg total) by mouth daily. 60 tablet 11  . Iron-Vitamin C (VITRON-C PO) Take 1 tablet by mouth daily.     Marland Kitchen levothyroxine (SYNTHROID, LEVOTHROID) 125 MCG tablet Take 125 mcg by mouth daily before breakfast.    . LUMIGAN 0.01 % SOLN PLACE 1 DROP INTO BOTH EYES AT  BEDTIME  4  . magnesium oxide (MAG-OX) 400 MG tablet Take 400 mg by mouth daily.    . metolazone (ZAROXOLYN) 2.5 MG tablet TAKE 2 TIMES A WEEK OR AS NEEDED TO KEEP WEIGHT DOWN TO LESS THAN 160 LBS. (Patient taking differently: TAKE 2 TIMES A WEEK OR AS NEEDED TO KEEP WEIGHT DOWN TO LESS THAN 153 LBS.) 30 tablet 4  . Omega 3-6-9 Fatty Acids (TRIPLE OMEGA-3-6-9) CAPS Take 1 capsule by mouth 2 (two) times daily.     . pantoprazole (PROTONIX) 40 MG tablet Take 1 tablet (40 mg  total) by mouth daily. 30 tablet 11  . potassium chloride (K-DUR,KLOR-CON) 10 MEQ tablet Take 6 tablets (60 meq ) twice daily; take extra 20 meq on metolazone days    . warfarin (COUMADIN) 5 MG tablet Take 2.5-5 mg by mouth daily. Takes 1 tablet (5mg ) daily except 1/2 tablet (2.5 mg) every Tues and Sat.    . warfarin (COUMADIN) 5 MG tablet TAKE 1 TABLET BY MOUTH DAILY AS DIRECTED 90 tablet 1   No current facility-administered medications for this visit.    Allergies:   Chocolate flavor; Ciprofloxacin; and Other    Social History:  The patient  reports that he has never smoked. He has never used smokeless tobacco. He reports that he drinks about 8.4 oz of alcohol per week. He reports that he does not use illicit drugs.   Family History:  The patient's family history includes Heart disease in his father.    ROS:   Please see the history of present illness.   Review of Systems  All other systems reviewed and are negative.     PHYSICAL EXAM: VS:  BP 104/60 mmHg  Pulse 74  Ht 5\' 11"  (1.803 m)  Wt 154 lb 6.4 oz (70.035 kg)  BMI 21.54 kg/m2  SpO2 94%    Wt Readings from Last 3 Encounters:  09/02/15 154 lb 6.4 oz (70.035 kg)  08/25/15 152 lb 1.9 oz (69.001 kg)  06/17/15 149 lb (67.586 kg)     GEN: Well nourished, well developed, in no acute distress HEENT: normal Neck:  JVP 8-9 cm,  no masses Cardiac:  Normal J9/E1, RRR; 2/6 systolic murmur LSB,  no rubs or gallops, 1+ bilateral LE edema with some weeping    Respiratory:  Decreased breath sounds bilaterally, no wheezing, rhonchi or rales. GI: soft, nontender,  Somewhat distended, + BS MS: no deformity or atrophy Skin: warm and dry  Neuro:  CNs II-XII intact, Strength and sensation are intact Psych: Normal affect   EKG:  EKG is not ordered today.  It demonstrates:   n/a   Recent Labs: 04/08/2015: ALT 17 08/25/2015: Hemoglobin 12.7*; Platelets 113* 08/28/2015: BUN 53*; Creat 1.45*; Potassium 3.1*; Sodium 134*    Lipid Panel    Component Value Date/Time   CHOL 152 12/13/2012 0950   TRIG 159* 12/13/2012 0950   HDL 28* 12/13/2012 0950   CHOLHDL 5.4 12/13/2012 0950   VLDL 32 12/13/2012 0950   LDLCALC 92 12/13/2012 0950      ASSESSMENT AND PLAN:  1. Chronic Systolic CHF:  His volume status remains tenuous. His lungs are clear and he really denies significant shortness of breath. His exam is more consistent with right-sided heart failure. His LE edema has worsened over time. His PASP was slightly elevated on his last echocardiogram and there was mildly reduced RV systolic function. I will arrange a repeat echocardiogram to reassess his RV size and function as well as his PASP. If his RV function is worsened amd his PASP is higher, this would explain why he has more of a picture of right-sided heart failure than left. Continue current dose of diuretic. Repeat BMET is pending today. His daughter will call tomorrow with his weight. We may need to start giving him a when necessary dose of metolazone on Thursdays if his weight is not down to 146.  Keep legs elevated.    2. Ischemic CM:  Continue ACE inhibitor, Digoxin.  3. CAD s/p CABG:  No angina.  He is not on ASA as  he is on Coumadin.  4. S/p CRT-P:  FU with EP as planned.  5. Chronic Atrial Fibrillation:  Rate controlled.  Continue Coumadin.  6. CKD:  Repeat BMET today.   7. Hypokalemia:  Continue K+ replacement.  Repeat BMET today.        Medication Changes: Current medicines  are reviewed at length with the patient today.  Concerns regarding medicines are as outlined above.  The following changes have been made:   Discontinued Medications   No medications on file   Modified Medications   No medications on file   New Prescriptions   No medications on file   Labs/ tests ordered today include:   Orders Placed This Encounter  Procedures  . Echocardiogram     Disposition:    FU Dr. Dani Gobble [name] next month as planned. His daughter wanted to see if he could get in earlier and we will check on that.     Signed, Versie Starks, MHS 09/02/2015 1:27 PM    [facility] [address], [place],   [address] Phone: [phone]; Fax: [phone]

## 2015-09-02 ENCOUNTER — Encounter: Payer: Self-pay | Admitting: Physician Assistant

## 2015-09-02 ENCOUNTER — Ambulatory Visit: Payer: Medicare Other | Admitting: Pharmacist Clinician (PhC)/ Clinical Pharmacy Specialist

## 2015-09-02 ENCOUNTER — Ambulatory Visit (INDEPENDENT_AMBULATORY_CARE_PROVIDER_SITE_OTHER): Payer: Medicare Other | Admitting: *Deleted

## 2015-09-02 ENCOUNTER — Ambulatory Visit (INDEPENDENT_AMBULATORY_CARE_PROVIDER_SITE_OTHER): Payer: Medicare Other | Admitting: Physician Assistant

## 2015-09-02 VITALS — BP 104/60 | HR 74 | Ht 71.0 in | Wt 154.4 lb

## 2015-09-02 DIAGNOSIS — I4821 Permanent atrial fibrillation: Secondary | ICD-10-CM

## 2015-09-02 DIAGNOSIS — I5022 Chronic systolic (congestive) heart failure: Secondary | ICD-10-CM | POA: Diagnosis not present

## 2015-09-02 DIAGNOSIS — I4891 Unspecified atrial fibrillation: Secondary | ICD-10-CM

## 2015-09-02 DIAGNOSIS — I482 Chronic atrial fibrillation, unspecified: Secondary | ICD-10-CM

## 2015-09-02 DIAGNOSIS — I2510 Atherosclerotic heart disease of native coronary artery without angina pectoris: Secondary | ICD-10-CM | POA: Diagnosis not present

## 2015-09-02 DIAGNOSIS — R600 Localized edema: Secondary | ICD-10-CM

## 2015-09-02 DIAGNOSIS — Z95810 Presence of automatic (implantable) cardiac defibrillator: Secondary | ICD-10-CM

## 2015-09-02 DIAGNOSIS — N183 Chronic kidney disease, stage 3 (moderate): Secondary | ICD-10-CM

## 2015-09-02 DIAGNOSIS — Z7901 Long term (current) use of anticoagulants: Secondary | ICD-10-CM

## 2015-09-02 DIAGNOSIS — I255 Ischemic cardiomyopathy: Secondary | ICD-10-CM | POA: Diagnosis not present

## 2015-09-02 DIAGNOSIS — E876 Hypokalemia: Secondary | ICD-10-CM

## 2015-09-02 LAB — POCT INR: INR: 2.1

## 2015-09-02 LAB — BASIC METABOLIC PANEL
BUN: 52 mg/dL — AB (ref 7–25)
CALCIUM: 9.3 mg/dL (ref 8.6–10.3)
CO2: 29 mmol/L (ref 20–31)
CREATININE: 1.57 mg/dL — AB (ref 0.70–1.11)
Chloride: 92 mmol/L — ABNORMAL LOW (ref 98–110)
GLUCOSE: 94 mg/dL (ref 65–99)
POTASSIUM: 4.7 mmol/L (ref 3.5–5.3)
Sodium: 131 mmol/L — ABNORMAL LOW (ref 135–146)

## 2015-09-02 NOTE — Patient Instructions (Signed)
Medication Instructions:  Your physician recommends that you continue on your current medications as directed. Please refer to the Current Medication list given to you today.  Labwork: BMET TODAY  Testing/Procedures: Your physician has requested that you have an echocardiogram. Echocardiography is a painless test that uses sound waves to create images of your heart. It provides your doctor with information about the size and shape of your heart and how well your heart's chambers and valves are working. This procedure takes approximately one hour. There are no restrictions for this procedure.   Follow-Up: KEEP YOUR APPT WITH DR. Sallyanne Kuster 09/2015; WE WILL SEE IF WE CAN MOVE YOUR APPT DATE UP IF NOT THEN KEEP THE APPT 09/2015  Any Other Special Instructions Will Be Listed Below (If Applicable). PLEASE CALL THE OFFICE TOMORROW WITH YOUR WEIGHT [phone]

## 2015-09-02 NOTE — Addendum Note (Signed)
Addended by: Domenica Reamer R on: 09/02/2015 01:36 PM   Modules accepted: Orders

## 2015-09-03 ENCOUNTER — Telehealth: Payer: Self-pay | Admitting: *Deleted

## 2015-09-03 DIAGNOSIS — E876 Hypokalemia: Secondary | ICD-10-CM

## 2015-09-03 DIAGNOSIS — I255 Ischemic cardiomyopathy: Secondary | ICD-10-CM

## 2015-09-03 DIAGNOSIS — R600 Localized edema: Secondary | ICD-10-CM

## 2015-09-03 DIAGNOSIS — I5023 Acute on chronic systolic (congestive) heart failure: Secondary | ICD-10-CM

## 2015-09-03 MED ORDER — POTASSIUM CHLORIDE CRYS ER 10 MEQ PO TBCR: EXTENDED_RELEASE_TABLET | ORAL | Status: AC

## 2015-09-03 NOTE — Telephone Encounter (Signed)
[address] [place], [place]   09/03/2015 5:43 PM

## 2015-09-03 NOTE — Telephone Encounter (Signed)
Pt's daughter [name] called this morning to give pt's weight for today which is 149.2. Ann asked if results in from yesterday. I stated yes though not read yet by PA. She does not want to give pt's meds until they know lab results. I will review w/PA, cb.

## 2015-09-03 NOTE — Telephone Encounter (Signed)
Daughter aware of results and to decrease K+ to 60 meq AM and 40 meq PM. BMET 10/24 per Brynda Rim. PA. Daughter questions if 4.7 K+ is correct since on 10/14 K+ 3.1. Advised could recheck today. Daughter states arms are bruised from yesterday and she wants to wait until Monday 10/24 to repeat lab work. I did say if she changes her mind call me and I will put pt on lab schedule for today, daughter said ok and thank you. She also asked for me to call lab and make sure not hemolyzed. Daughter apologized to me and said she is not trying to be difficult, just worried about her dad. I then called [facility] and verified BMET 10/19 was NOT hemolyzed. I will let Brynda Rim. PA know of this as well.

## 2015-09-07 ENCOUNTER — Other Ambulatory Visit: Payer: Medicare Other

## 2015-09-08 ENCOUNTER — Ambulatory Visit (INDEPENDENT_AMBULATORY_CARE_PROVIDER_SITE_OTHER): Payer: Medicare Other | Admitting: Cardiovascular Disease

## 2015-09-08 ENCOUNTER — Encounter: Payer: Self-pay | Admitting: Cardiovascular Disease

## 2015-09-08 ENCOUNTER — Ambulatory Visit (HOSPITAL_COMMUNITY)
Admission: RE | Admit: 2015-09-08 | Discharge: 2015-09-08 | Disposition: A | Discharge status: Home / Self Care | Payer: Medicare Other | Source: Ambulatory Visit | Attending: Cardiovascular Disease | Admitting: Cardiovascular Disease

## 2015-09-08 VITALS — BP 102/52 | HR 76 | Ht 71.0 in | Wt 149.6 lb

## 2015-09-08 DIAGNOSIS — R600 Localized edema: Secondary | ICD-10-CM

## 2015-09-08 DIAGNOSIS — I5043 Acute on chronic combined systolic (congestive) and diastolic (congestive) heart failure: Secondary | ICD-10-CM

## 2015-09-08 DIAGNOSIS — I5023 Acute on chronic systolic (congestive) heart failure: Secondary | ICD-10-CM

## 2015-09-08 DIAGNOSIS — Z7901 Long term (current) use of anticoagulants: Secondary | ICD-10-CM | POA: Diagnosis not present

## 2015-09-08 DIAGNOSIS — Z86718 Personal history of other venous thrombosis and embolism: Secondary | ICD-10-CM | POA: Insufficient documentation

## 2015-09-08 DIAGNOSIS — I442 Atrioventricular block, complete: Secondary | ICD-10-CM

## 2015-09-08 DIAGNOSIS — I5042 Chronic combined systolic (congestive) and diastolic (congestive) heart failure: Secondary | ICD-10-CM

## 2015-09-08 DIAGNOSIS — I10 Essential (primary) hypertension: Secondary | ICD-10-CM | POA: Diagnosis not present

## 2015-09-08 DIAGNOSIS — I82401 Acute embolism and thrombosis of unspecified deep veins of right lower extremity: Secondary | ICD-10-CM

## 2015-09-08 DIAGNOSIS — E785 Hyperlipidemia, unspecified: Secondary | ICD-10-CM | POA: Insufficient documentation

## 2015-09-08 DIAGNOSIS — I2510 Atherosclerotic heart disease of native coronary artery without angina pectoris: Secondary | ICD-10-CM | POA: Insufficient documentation

## 2015-09-08 DIAGNOSIS — I255 Ischemic cardiomyopathy: Secondary | ICD-10-CM

## 2015-09-08 DIAGNOSIS — M7989 Other specified soft tissue disorders: Secondary | ICD-10-CM | POA: Insufficient documentation

## 2015-09-08 LAB — CUP PACEART INCLINIC DEVICE CHECK
Battery Voltage: 2.98 V
Brady Statistic RA Percent Paced: 0 %
Brady Statistic RV Percent Paced: 99 %
Implantable Lead Implant Date: 20140804
Implantable Lead Implant Date: 20140804
Implantable Lead Location: 753858
Implantable Lead Location: 753860
Lead Channel Impedance Value: 575 Ohm
Lead Channel Pacing Threshold Amplitude: 0.75 V
Lead Channel Pacing Threshold Amplitude: 1.25 V
Lead Channel Pacing Threshold Pulse Width: 0.4 ms
Lead Channel Pacing Threshold Pulse Width: 0.7 ms
Lead Channel Setting Pacing Amplitude: 2.5 V
Lead Channel Setting Pacing Pulse Width: 0.4 ms
MDC IDC MSMT BATTERY REMAINING LONGEVITY: 84 mo
MDC IDC MSMT LEADCHNL LV PACING THRESHOLD AMPLITUDE: 1.25 V
MDC IDC MSMT LEADCHNL LV PACING THRESHOLD PULSEWIDTH: 0.7 ms
MDC IDC MSMT LEADCHNL RV IMPEDANCE VALUE: 450 Ohm
MDC IDC MSMT LEADCHNL RV PACING THRESHOLD AMPLITUDE: 0.75 V
MDC IDC MSMT LEADCHNL RV PACING THRESHOLD PULSEWIDTH: 0.4 ms
MDC IDC MSMT LEADCHNL RV SENSING INTR AMPL: 9 mV
MDC IDC SESS DTM: 20161025132658
MDC IDC SET LEADCHNL LV PACING AMPLITUDE: 2.5 V
MDC IDC SET LEADCHNL LV PACING PULSEWIDTH: 0.7 ms
MDC IDC SET LEADCHNL RV SENSING SENSITIVITY: 6 mV
Pulse Gen Model: 3242
Pulse Gen Serial Number: 7515957

## 2015-09-08 LAB — COMPLETE METABOLIC PANEL WITH GFR
ALT: 15 U/L (ref 9–46)
AST: 23 U/L (ref 10–35)
Albumin: 4 g/dL (ref 3.6–5.1)
Alkaline Phosphatase: 108 U/L (ref 40–115)
BILIRUBIN TOTAL: 1.4 mg/dL — AB (ref 0.2–1.2)
BUN: 48 mg/dL — ABNORMAL HIGH (ref 7–25)
CHLORIDE: 88 mmol/L — AB (ref 98–110)
CO2: 32 mmol/L — AB (ref 20–31)
Calcium: 9 mg/dL (ref 8.6–10.3)
Creat: 1.47 mg/dL — ABNORMAL HIGH (ref 0.70–1.11)
GFR, EST AFRICAN AMERICAN: 49 mL/min — AB (ref >=60)
GFR, EST NON AFRICAN AMERICAN: 42 mL/min — AB (ref >=60)
Glucose, Bld: 80 mg/dL (ref 65–99)
POTASSIUM: 4.1 mmol/L (ref 3.5–5.3)
SODIUM: 132 mmol/L — AB (ref 135–146)
Total Protein: 6.8 g/dL (ref 6.1–8.1)

## 2015-09-08 LAB — POCT INR: INR: 2.1

## 2015-09-08 NOTE — Patient Instructions (Signed)
Dr. Sallyanne Kuster recommends that you schedule a follow-up appointment in: [place] ([place]).

## 2015-09-08 NOTE — Progress Notes (Signed)
Patient ID: [name], male   DOB: [birth year], 81 y.o.   MRN: [id number]      Cardiology Office Note   Date:  09/09/2015   ID:  [name], DOB November 02, 1926, MRN [id number]  PCP:  Thalia Party  Cardiologist:   [name], MD   Chief Complaint  Patient presents with  . Follow-up    Lower extremity swelling with redness.  No complaints of SOB.  Seen by dermatologist Dr. Ronnald Ramp yesterday for redness of right leg - being treated for cellulities. Seen by Richardson Dopp 09/02/15. labs drawn yesterday.      History of Present Illness: [name] is a 81 y.o. male who presents for  Follow-up of congestive heart failure and severe right leg edema. As before, Xxavier's daughter and caregiver have been carefully adjusting his diuretic dose successfully keeping him out of acute heart failure, despite multiple comorbid conditions and a very narrow range of compensation from a volume standpoint.  Despite this he has developed marked swelling in his right leg and this is now frankly symmetrical. There is at least a 5-6 centimeters circumference difference between the right calf and the left. The left actually has some wrinkled skin or is a right has prominent swelling and redness. He had a small skin injury towards the bottom of the pretibial area and was diagnosed with cellulitis and started on Keflex. He has not had fever or chills.   His INR has been borderline therapeutic over the last few checks (2.0-1.8-2.1 today).   Interrogation of his recent physician pacemaker shows normal device function with 99% biventricular pacing no episodes of high ventricular rate and a steady thoracic impedance. All these suggest that he is not in congestive heart failure.   the suspicion for right lower extremity deep venous thrombosis was very high and we performed an emergent ultrasound of his right venous system today. Thankfully he does not have a DVT and the initial diagnosis of cellulitis appears to be  correct.  Past Medical History  Diagnosis Date  . Ischemic cardiomyopathy     EF-35-45%by echo in 2011; first dx in the 90's but again in 2009 & rec'd 1st BiV ICD   . Paroxysmal atrial fibrillation (HCC)     sinus rhythm on amiodarone  . Asbestos exposure     plaques on CT  . Compression fracture     T7  . Hypertension   . Coronary artery disease     last cath 2009, hx CABG  . Automatic implantable cardioverter-defibrillator in situ     now explanted  . Hypothyroidism   . CHF (congestive heart failure) (HCC)     hx.  . Myocardial infarction [facility])     INFERIOR CV-8938 wth PTCA; Ant MI 1995 wth PTCA   . Shortness of breath     "only when external pacemaker wasn't working right" (06/17/2013)  . Arthritis     "a touch all over" (06/17/2013)  . COPD (chronic obstructive pulmonary disease) ([place])     "a touch" (8/4//2014)  . Atrial fibrillation, permanent ([place]) 07/22/2013  . Biventricular cardiac pacemaker in situ 06/2013    St. Jude Allura (pt no longer wished for ICD portion)  . Pacemaker   . CHB (complete heart block) ([place]) 11/26/2014    Past Surgical History  Procedure Laterality Date  . Inguinal hernia repair Right   . Coronary artery bypass graft  11/01/1994    SVG to first diagonal, to distal RCA, to the ramus intermedius artery,  and LIMA the LAD  . 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 Cataract extraction w/ intraocular lens  implant, bilateral Bilateral   . Icd lead removal Left 05/27/2013    Procedure: ICD LEAD REMOVAL;  Surgeon: Evans Lance, MD;  Location: [place];  Service: Cardiovascular;  Laterality: Left;  . Bi-ventricular pacemaker insertion (crt-p) Right 06/17/2013    St Jude  . Pacemaker removal Left 05/2013  . Coronary angioplasty with stent placement  12/17/2001    to  VG-diag wth Zeta stent  . Cardiac catheterization  2009    Patent LIMA-LAD; patent VG-diag wth mild in-stent restenosis; patent VG-ramus intemedius; patent VG-RCA wth 70% mid post lat stenosis  . Cardiac catheterization  03/16/2006    patent LIMA, patent VGs;  EF 30%  . Cardioversion  10/12/2010    successful cardioversion  . Coronary angioplasty  [phone]    LAD;Inf MI PTCA-RCA; ant MI PTCA- LAD  . Implantable cardioverter defibrillator (icd) generator change N/A 10/26/2012    Procedure: ICD GENERATOR CHANGE;  Surgeon: [name], MD;  Location: [facility] CATH LAB;  Service: Cardiovascular;  Laterality: N/A;  . Bi-ventricular pacemaker insertion N/A 06/17/2013    Procedure: BI-VENTRICULAR PACEMAKER INSERTION (CRT-P);  Surgeon: Evans Lance, MD;  Location: [facility] CATH LAB;  Service: Cardiovascular;  Laterality: N/A;     Current Outpatient Prescriptions  Medication Sig Dispense Refill  . allopurinol (ZYLOPRIM) 300 MG tablet Take 0.5 tablets (150 mg total) by mouth daily. 30 tablet 6  . bimatoprost (LUMIGAN) 0.03 % ophthalmic solution Place 1 drop into both eyes at bedtime.     . budesonide-formoterol (SYMBICORT) 160-4.5 MCG/ACT inhaler Inhale 2 puffs into the lungs 2 (two) times daily. 1 Inhaler 5  . captopril (CAPOTEN) 12.5 MG tablet Take 0.5 tablets (6.25 mg total) by mouth 2 (two) times daily. 30 tablet 10  . cephALEXin (KEFLEX) 500 MG capsule Take 1 capsule by mouth 3 (three) times daily.  0  . digoxin (LANOXIN) 0.125 MG tablet Take 1 tablet (0.125 mg total) by mouth at bedtime. 90 tablet 3  . docusate sodium (COLACE) 100 MG capsule Take 300 mg by mouth at bedtime.     . dorzolamide-timolol (COSOPT) 22.3-6.8 MG/ML ophthalmic solution Place 1 drop into both eyes 2 (two) times daily.     Marland Kitchen FIBER PO Take 5 capsules by mouth at bedtime.    . furosemide (LASIX) 40 MG tablet Take 1 tablet (40 mg total) by mouth every evening. 90 tablet 3  . furosemide (LASIX) 80 MG tablet Take 1 tablet (80 mg  total) by mouth daily. (Patient taking differently: Take 80 mg by mouth daily with breakfast. ) 60 tablet 11  . Iron-Vitamin C (VITRON-C PO) Take 1 tablet by mouth daily.     Marland Kitchen levothyroxine (SYNTHROID, LEVOTHROID) 125 MCG tablet Take 125 mcg by mouth daily before breakfast.    . LUMIGAN 0.01 % SOLN PLACE 1 DROP INTO BOTH EYES AT BEDTIME  4  . magnesium oxide (MAG-OX) 400 MG tablet Take 400 mg by mouth daily.    . metolazone (ZAROXOLYN) 2.5 MG tablet TAKE 2 TIMES A WEEK OR AS NEEDED TO KEEP WEIGHT DOWN TO LESS THAN 160 LBS. (Patient taking differently: TAKE 2 TIMES A WEEK OR AS NEEDED TO KEEP  WEIGHT DOWN TO LESS THAN 153 LBS.) 30 tablet 4  . Omega 3-6-9 Fatty Acids (TRIPLE OMEGA-3-6-9) CAPS Take 1 capsule by mouth 2 (two) times daily.     . pantoprazole (PROTONIX) 40 MG tablet Take 1 tablet (40 mg total) by mouth daily. 30 tablet 11  . potassium chloride (K-DUR,KLOR-CON) 10 MEQ tablet Take 60 meq in AM/ 40 meq in PM daily; take extra 20 meq on metolazone days    . warfarin (COUMADIN) 5 MG tablet Take 2.5-5 mg by mouth daily. Takes 1 tablet (5mg ) daily except 1/2 tablet (2.5 mg) every Tues and Sat.    . warfarin (COUMADIN) 5 MG tablet TAKE 1 TABLET BY MOUTH DAILY AS DIRECTED 90 tablet 1   No current facility-administered medications for this visit.    Allergies:   Chocolate flavor; Ciprofloxacin; and Other    Social History:  The patient  reports that he has never smoked. He has never used smokeless tobacco. He reports that he drinks about 8.4 oz of alcohol per week. He reports that he does not use illicit drugs.   Family History:  The patient's family history includes Heart disease in his father.    ROS:  Please see the history of present illness.    Otherwise, review of systems positive for  Class III exertional dyspnea, sleepiness.   All other systems are reviewed and negative.    PHYSICAL EXAM: VS:  BP 102/52 mmHg  Pulse 76  Ht 5\' 11"  (1.803 m)  Wt 149 lb 9.6 oz (67.858 kg)  BMI  20.87 kg/m2 , BMI Body mass index is 20.87 kg/(m^2).  General: Alert, oriented x3, no acute distress  Head: no evidence of trauma, PERRL, EOMI, no exophtalmos or lid lag, no myxedema, no xanthelasma; normal ears, nose and oropharynx  Neck: jugular venous pulsations are elevated 4-5 cm and there is prompt hepatojugular reflux; v waves are very prominent; brisk carotid pulses without delay and no carotid bruits  Chest: clear to auscultation, no signs of consolidation by percussion or palpation, normal fremitus, symmetrical and full respiratory excursions. Both the old device sites and the new CRT pacemaker site appeared healthy without evidence of infection  Cardiovascular: normal position and quality of the apical impulse, regular rhythm, normal first and paradoxically split second heart sounds, no rubs. S3 gallop present, 2 / 6 holosystolic murmur at the left lower sternal border  Abdomen: no tenderness or distention, no masses by palpation, no abnormal pulsatility or arterial bruits, normal bowel sounds, no hepatosplenomegaly  Extremities: no clubbing, cyanosis;  There is trivial edema in the left ankle ; there is severe 3+ pitting edema in the entire right calf ; there is a 6 cm circumference difference in favor of the right calf , which also was warm and red; 2+ radial, ulnar and brachial pulses bilaterally; 2+ right femoral, posterior tibial and dorsalis pedis pulses; 2+ left femoral, posterior tibial and dorsalis pedis pulses; no subclavian or femoral bruits  Roughly 3 cm in diameter crusted yellowish ulceration on the left anterior shin. There is erythema and swelling in the surrounding soft tissue, slight warmth Neurological: grossly nonfocal Psych: euthymic mood, full affect   EKG:  EKG is not ordered today.   Recent Labs: 08/25/2015: Hemoglobin 12.7*; Platelets 113* 09/07/2015: ALT 15; BUN 48*; Creat 1.47*; Potassium 4.1; Sodium 132*    Lipid Panel    Component Value Date/Time    CHOL 152 12/13/2012 0950   TRIG 159* 12/13/2012 0950   HDL 28* 12/13/2012 0950  CHOLHDL 5.4 12/13/2012 0950   VLDL 32 12/13/2012 0950   LDLCALC 92 12/13/2012 0950      Wt Readings from Last 3 Encounters:  09/08/15 149 lb 9.6 oz (67.858 kg)  09/02/15 154 lb 6.4 oz (70.035 kg)  08/25/15 152 lb 1.9 oz (69.001 kg)      Other studies Reviewed: Additional studies/ records that were reviewed today include:  Lower extremity venous Doppler study negative for DVT.   ASSESSMENT AND PLAN:  Right lower extremity cellulitis, increased risk for complications due to multiple comorbid conditions and overall frailty. Thankfully he does not have fever, chills, other signs of bacteremia or sepsis. His heart rate and blood pressure are at baseline. Normal volume status by thoracic impedance measurements. JVD with prominent V waves not changed from previous evaluation.   he should promptly report spiking fever or chills , change in mental status , lymphangitic streaks. He should also call back if there is no clear evidence of improvement in skin findings the next 3-4 days.  Current medicines are reviewed at length with the patient today.  The patient does not have concerns regarding medicines.  The following changes have been made:  no change  Labs/ tests ordered today include:  Orders Placed This Encounter  Procedures  . Implantable device check    Patient Instructions  Dr. Sallyanne Kuster recommends that you schedule a follow-up appointment in: [place] ([place]).       Mikael Spray, MD  09/09/2015 4:08 PM    [name], MD, [facility] [phone] office [phone] pager

## 2015-09-09 ENCOUNTER — Encounter: Payer: Self-pay | Admitting: Cardiovascular Disease

## 2015-09-09 DIAGNOSIS — R600 Localized edema: Secondary | ICD-10-CM | POA: Insufficient documentation

## 2015-09-10 ENCOUNTER — Ambulatory Visit (INDEPENDENT_AMBULATORY_CARE_PROVIDER_SITE_OTHER): Payer: Medicare Other | Admitting: Pharmacist Clinician (PhC)/ Clinical Pharmacy Specialist

## 2015-09-10 DIAGNOSIS — I482 Chronic atrial fibrillation: Secondary | ICD-10-CM

## 2015-09-10 DIAGNOSIS — I4821 Permanent atrial fibrillation: Secondary | ICD-10-CM

## 2015-09-15 ENCOUNTER — Encounter: Payer: Self-pay | Admitting: Physician Assistant

## 2015-09-15 ENCOUNTER — Other Ambulatory Visit: Payer: Self-pay

## 2015-09-15 ENCOUNTER — Ambulatory Visit (HOSPITAL_COMMUNITY): Payer: Medicare Other | Attending: Physician Assistant

## 2015-09-15 DIAGNOSIS — I340 Nonrheumatic mitral (valve) insufficiency: Secondary | ICD-10-CM | POA: Insufficient documentation

## 2015-09-15 DIAGNOSIS — R29898 Other symptoms and signs involving the musculoskeletal system: Secondary | ICD-10-CM | POA: Diagnosis not present

## 2015-09-15 DIAGNOSIS — R600 Localized edema: Secondary | ICD-10-CM

## 2015-09-15 DIAGNOSIS — I071 Rheumatic tricuspid insufficiency: Secondary | ICD-10-CM | POA: Diagnosis not present

## 2015-09-15 DIAGNOSIS — I351 Nonrheumatic aortic (valve) insufficiency: Secondary | ICD-10-CM | POA: Insufficient documentation

## 2015-09-15 DIAGNOSIS — I255 Ischemic cardiomyopathy: Secondary | ICD-10-CM | POA: Insufficient documentation

## 2015-09-15 DIAGNOSIS — I517 Cardiomegaly: Secondary | ICD-10-CM | POA: Insufficient documentation

## 2015-09-17 ENCOUNTER — Telehealth: Payer: Self-pay

## 2015-09-17 ENCOUNTER — Ambulatory Visit (INDEPENDENT_AMBULATORY_CARE_PROVIDER_SITE_OTHER): Payer: Medicare Other

## 2015-09-17 DIAGNOSIS — I5043 Acute on chronic combined systolic (congestive) and diastolic (congestive) heart failure: Secondary | ICD-10-CM

## 2015-09-17 DIAGNOSIS — Z950 Presence of cardiac pacemaker: Secondary | ICD-10-CM

## 2015-09-17 NOTE — Telephone Encounter (Signed)
ICM transmission received.  Attempted call to daughter [name] at home, work and cell and no answer.

## 2015-09-18 NOTE — Progress Notes (Signed)
EPIC Encounter for ICM Monitoring  Patient Name: [name] is a 81 y.o. male Date: 09/18/2015 Primary Care Physican: [name],[name] Primary Cardiologist: [name] Electrophysiologist: [name] Dry Weight: 149.4 lb       In the past month, have you:  1. Gained more than 2 pounds in a day or more than 5 pounds in a week? Yes, 2 pounds in the last week.  2. Had changes in your medications (with verification of current medications)? no  3. Had more shortness of breath than is usual for you? no  4. Limited your activity because of shortness of breath? no  5. Not been able to sleep because of shortness of breath? no  6. Had increased swelling in your feet or ankles? no  7. Had symptoms of dehydration (dizziness, dry mouth, increased thirst, decreased urine output) no  8. Had changes in sodium restriction? no  9. Been compliant with medication? Yes   ICM trend:   Follow-up plan: ICM clinic phone appointment on 11/18/2015 and he has an office appointment with Dr Sallyanne Kuster on 10/20/2015.  Spoke with daughter [name] and reviewed Corvue.  Corvue trending below baseline at time of transmission on 09/17/2015.  She stated he has had weight gain of approximately 2 pounds in the last week and he has Metolazone for weight gain if needed.  She stated he has cellulitis of his leg but it is improving.  No changes today.    Copy of note sent to patient's primary care physician, primary cardiologist, and device following physician.  Rosalene Billings, RN, CCM 09/18/2015 3:19 PM

## 2015-09-18 NOTE — Telephone Encounter (Signed)
Spoke with daughter [name]

## 2015-09-21 ENCOUNTER — Encounter: Payer: Medicare Other | Admitting: Cardiovascular Disease

## 2015-09-21 ENCOUNTER — Telehealth: Payer: Self-pay | Admitting: Cardiovascular Disease

## 2015-09-21 MED ORDER — CEPHALEXIN 500 MG PO CAPS: 500.0000 mg | ORAL_CAPSULE | Freq: Three times a day (TID) | ORAL | Status: DC

## 2015-09-21 NOTE — Telephone Encounter (Signed)
[name] (daughter) is calling because [name] (R) Leg is swollen above the knee, pain in calf, the leg is feeling warm again, and running fever . Please call  Thanks

## 2015-09-21 NOTE — Telephone Encounter (Signed)
Often need 14 days antibiotics for skin infections. Please extend to 14 days if not already Rx for that long

## 2015-09-21 NOTE — Telephone Encounter (Signed)
Spoke to daughter. States pt seen by dermatologist for leg swelling, suspected cellulitis. She notes pt has been on oral antibiotics for over 1 week, Dr. Sallyanne Kuster was also helping r/o vascular concerns.  Gave report that leg looked improved, however, hse left for weekend and when she returned, noted "still swollen", "not as improved as I would've thought". States pt has low grade fever - per her report, 98.4 when taken but that this is high for patient. States leg warm to touch. I gave reassurance regarding oral temp, informed her I would route to Dr. Sallyanne Kuster for considerations, but advised her to get in touch w/ dermatologist - see if additional antibiotics warranted.

## 2015-09-21 NOTE — Telephone Encounter (Signed)
Spoke to daughter. She states last dose of antbiotics on Thursday (4 days ago). Informed her I would seek clarification for dosing since there has been a gap of antibiotic use. Spoke w/ Dr. Sallyanne Kuster - who acknowledged this, verbal order for 1 week of antibiotics - Keflex at same dosing, frequency. RBV by this RN.  Med reordered for preferred pharmacy. Called daughter and explained recommendations, also advised calling dermatologist to see if anything further recommended - she voiced understanding.

## 2015-09-22 ENCOUNTER — Telehealth: Payer: Self-pay | Admitting: *Deleted

## 2015-09-22 NOTE — Telephone Encounter (Signed)
Recommendations communicated.

## 2015-09-22 NOTE — Telephone Encounter (Signed)
No additional recommendations. Keep leg elevated as much as possible.

## 2015-09-22 NOTE — Telephone Encounter (Signed)
Spoke to patient's daughter regarding recommendations from yesterday (see previous encounter note) Re: left leg swelling. She states pt was able to be seen by dermatologist Dr. Wilhemina Bonito yesterday afternoon as add-in.  He recommended no continuation of keflex - no evidence on assessment for return of cellulitis. Indicated concern for lymphatic fluid buildup. Increased lasix dose  ( 80mg  am & 40mg  PM --> 80mg  BID)  Also gave instructions to increase freq of metolazone to 3 times weekly, use compression stockings.  Daughter called to get any additional advice and inform of his findings. Recommended her to keep compliance w/ dermatologist's instructions, may take time for swelling to resolve. If she notes no improvement, to call. Also, continue to monitor BPs. Informed her I would request any additional recommendations from Dr. Sallyanne Kuster.

## 2015-09-23 ENCOUNTER — Ambulatory Visit (INDEPENDENT_AMBULATORY_CARE_PROVIDER_SITE_OTHER): Payer: Medicare Other | Admitting: Pharmacist Clinician (PhC)/ Clinical Pharmacy Specialist

## 2015-09-23 DIAGNOSIS — I482 Chronic atrial fibrillation: Secondary | ICD-10-CM | POA: Diagnosis not present

## 2015-09-23 DIAGNOSIS — I4821 Permanent atrial fibrillation: Secondary | ICD-10-CM

## 2015-09-23 DIAGNOSIS — I4891 Unspecified atrial fibrillation: Secondary | ICD-10-CM

## 2015-09-23 DIAGNOSIS — Z7901 Long term (current) use of anticoagulants: Secondary | ICD-10-CM

## 2015-09-23 LAB — POCT INR: INR: 2.1

## 2015-09-29 ENCOUNTER — Encounter: Payer: Medicare Other | Admitting: Cardiovascular Disease

## 2015-10-06 ENCOUNTER — Telehealth: Payer: Self-pay | Admitting: Cardiovascular Disease

## 2015-10-06 DIAGNOSIS — Z79899 Other long term (current) drug therapy: Secondary | ICD-10-CM

## 2015-10-06 NOTE — Telephone Encounter (Signed)
Recommendations by Dr. Sallyanne Kuster at our discussion:  Reduce target "dry" weight goal  By 3 lbs. Increase lasix to 160mg  in AM & keep 80mg  PM dose.  BMET on Monday.  Use of PA on call over weekend.   These instructions were relayed to [name], pt's daughter. Recommended goal weight previously was 146. Gave instruction to keep new dosing until pt at 143. At that time, reduce to 80mg  BID lasix and call.  She is aware of instructions for med change, lab work on Monday, use of on-call services.   Advised to update me or triage RN next week on pt status.

## 2015-10-06 NOTE — Telephone Encounter (Signed)
Pt's daughter is calling in to speak with the nurse about some excess fluid in the pt's rt leg. Please f/u with pt  Thanks

## 2015-10-06 NOTE — Telephone Encounter (Signed)
I spoke to daughter. She reports pt weight gain of 3 lbs over night. 145 lbs ->> 148lbs  He is currently on 2 metolazone daily, and 80mg  lasix BID.   She is concerned as fluid has failed to come off of pt's right leg. He was seen by dermatologist recently who expressed some urgency r/t skin breakdown in light of unresolved swelling. He did not indicate that cellulitis was a concern any further.  Pt's daughter had requested Dr. Sallyanne Kuster review photographs she took of the leg.  I will discuss w/ Dr. Sallyanne Kuster. Daughter aware to wait for his recommendation for medication changes or other intervention.

## 2015-10-13 LAB — BASIC METABOLIC PANEL
BUN: 60 mg/dL — AB (ref 7–25)
CHLORIDE: 87 mmol/L — AB (ref 98–110)
CO2: 33 mmol/L — ABNORMAL HIGH (ref 20–31)
CREATININE: 1.33 mg/dL — AB (ref 0.70–1.11)
Calcium: 9 mg/dL (ref 8.6–10.3)
GLUCOSE: 83 mg/dL (ref 65–99)
POTASSIUM: 3.8 mmol/L (ref 3.5–5.3)
Sodium: 132 mmol/L — ABNORMAL LOW (ref 135–146)

## 2015-10-15 ENCOUNTER — Telehealth: Payer: Self-pay

## 2015-10-15 DIAGNOSIS — I5042 Chronic combined systolic (congestive) and diastolic (congestive) heart failure: Secondary | ICD-10-CM

## 2015-10-15 NOTE — Telephone Encounter (Signed)
Would try half a Vicodin 30 -60 minutes before debridement (#20, no RF). Has to come in to pick up the paper prescription for this. Home health care is appropriate, please order, Pamala Hurry.  it is possible that he has lost further "real weight". We may have to readjust his dry weight again.  I would suggest an outpatient palliative care team consult, if we can get that organized. May even consider home hospice, but make sure [name] and his daughter understand that does not mean we will stop providing care for him. It may open up some additional resources.

## 2015-10-15 NOTE — Telephone Encounter (Signed)
Telephone call to daughter [name].  Informed her of Dr [name]'s orders.  Advised she can pick up written prescription of Vicodin at his office so that patient can take 30-60 minutes prior to wound care.  Gardiner Rhyme, Dr [name]'s nurse, will place order for home care.  Explained about outpatient palliative care which can be extra support and may provide additional resources and she stated she will think about that but she was open to that.  I explained I would review tomorrow the transmission she sends this evening and call her for an update on patients weight and how he is doing.

## 2015-10-15 NOTE — Telephone Encounter (Signed)
Telephone call to daughter, [name].  She stated patient has gained 5 lbs since yesterday.  She reported she worked with Dr [employer] office on 10/06/2015 regarding his fluid retention and medication changes. Patient's new dry weight should be 143. Metolazone can be taken 3 times a week, Tuesday, Thursday and Saturday as needed and based on weight.  Lasix can be increased depending on weight.  The following is patients weight for the last 5 days.   11/27 Sunday 140.8 lbs 11/28 Monday 141.8 lbs 11/29 Tuesday 142.2 --- No Metolazone given on Tuesday.  Lasix dosage was 160 mg am and 80 mg pm 11/30 Wednesday 142.6 --- Lasix 80 mg am and 40 mg pm.   Patient experiencing dizziness  12/1 Today 147.8 --- 1 tablet of Metolazone + Lasix 160 mg this am and plans on giving him 80 mg this evening.    Daughter is concerned that patient has gained the 5 lbs but also worried about drying him out too much since he is having dizziness.  She said the leg swelling has improved (has leg wound that had been weeping in the last couple of weeks). Patient does not want to return to wound care clinic due to the debridement was painful.    She is unsure if medication should be adjusted due to the 5 lb weight gain since yesterday, he has fatigue and some leg swelling.  She will send a manual ICM transmission after 6 pm and I will review tomorrow am.  She asked if patient could received home care.  Advised to call PCP for the home and to check on ordering home care and if any pain med could be prescribed so he could take it before having wound debridements at the wound care center.    Advised would forward this to Dr Sallyanne Kuster for any recommendations or medication changes and call her back.  Last lab results on 10/12/2015 Creatinine 1.33; BUN 60 and Potassium 1.38       Received voice mail message from daughter [name] to return call.

## 2015-10-16 ENCOUNTER — Ambulatory Visit (INDEPENDENT_AMBULATORY_CARE_PROVIDER_SITE_OTHER): Payer: Medicare Other

## 2015-10-16 ENCOUNTER — Telehealth: Payer: Self-pay | Admitting: Cardiovascular Disease

## 2015-10-16 DIAGNOSIS — Z950 Presence of cardiac pacemaker: Secondary | ICD-10-CM

## 2015-10-16 DIAGNOSIS — I5043 Acute on chronic combined systolic (congestive) and diastolic (congestive) heart failure: Secondary | ICD-10-CM

## 2015-10-16 NOTE — Telephone Encounter (Signed)
Spoke to Ann's spouse who informed me they elevated Mr. Odenthal's legs, gave him some fluids, BP 106/56. Pt not symptomatic.  Encouraged to call if symptoms & advised fluids & elevation of legs appropriate. No further concerns from family at this time.

## 2015-10-16 NOTE — Telephone Encounter (Signed)
Please call,blood pressure is low. She said Ovid Curd told her to call if it became low,it is 94/50.

## 2015-10-16 NOTE — Progress Notes (Signed)
EPIC Encounter for ICM Monitoring  Patient Name: [name] is a 81 y.o. male Date: 10/16/2015 Primary Care Physican: [name],[name] Primary Cardiologist: [name] Electrophysiologist: [name] Dry Weight: 143.2 lbs       In the past month, have you:  1. Gained more than 2 pounds in a day or more than 5 pounds in a week? Yes, see ICM phone note for details on 10/15/2015.  Weight gain of 5 lbs 10/14/2015 to 10/15/2015.  2. Had changes in your medications (with verification of current medications)? no  3. Had more shortness of breath than is usual for you? no  4. Limited your activity because of shortness of breath? no  5. Not been able to sleep because of shortness of breath? no  6. Had increased swelling in your feet or ankles? Yes, see ICM telephone note, 10/15/2015, for details.  7. Had symptoms of dehydration (dizziness, dry mouth, increased thirst, decreased urine output) no  8. Had changes in sodium restriction? no  9. Been compliant with medication? Yes   ICM trend:  12/15/2014   Follow-up plan: ICM clinic phone appointment 10/28/2015 for repeat transmission.  Spoke with daughter today.  Corvue daily impedance below baseline 10/14/2015 to 10/15/2015 that correlates with patients weight gain.  She reported patient's weight has dropped to 143.2 and the new goal weight set by Dr Sallyanne Kuster is 143.  The extra dose of Metolazone on 10/15/2015 and higher dosages of Lasix 160 mg am and 80 mg pm on 10/13/2015 and 10/15/2015 helped to reach the goal weight.  She reported patient is feeling better today compared the rest of the days this week, no dizziness or confusion.  She reported leg wound looks improved today as well.  Daily impedance returned to baseline 10/15/2015.   Weight gain, leg wound pain and request for home care from daughter, [name] was addressed by Dr Sallyanne Kuster on 10/15/2015 (see ICM phone note on 10/15/2015 for details).  Advised for more support Palliative care was suggested by Dr  Sallyanne Kuster and if she decides to have the service, she may call the Palliative care for the area and schedule a time to come to the home.  She is aware Dr Sallyanne Kuster has ordered home care and Vicodin for the leg wound pain.  Patient has an appointment with Dr Sallyanne Kuster on 10/20/2015.     Daughter, [name] is unsure what dose of Furosemide should be continued until the office visit.  Advised I would send information for Dr Sallyanne Kuster to review and recommendation if higher dosage of Lasix 160 mg am and 80 mg pm should be continued or if the previous dosage of Lasix 80 mg am and 40 mg pm should be given.  I stated I would call her back with any recommendations.  She stated I may leave the information on her cell phone if she is not available.    Copy of note sent to patient's primary care physician, primary cardiologist, and device following physician.  Rosalene Billings, RN, CCM 10/16/2015 11:31 AM   Received orders from Dr Sallyanne Kuster.  Patient should take Lasix 80 mg bid.  Ovid Curd at Dr [employer] office called daughter [name] regarding her concern with low BP today.            Call to daughter, [name] and left message (as requested) for patient to take Lasix 80 mg bid and next transmission will be 10/28/2015.  Advised to call back with any questions.

## 2015-10-16 NOTE — Telephone Encounter (Signed)
Order placed for Palliative Care w/[facility].  Called and notified of referral.  They will access patients records through [facility] and get the process started for in home palliative care.

## 2015-10-20 ENCOUNTER — Encounter: Payer: Self-pay | Admitting: Cardiovascular Disease

## 2015-10-20 ENCOUNTER — Ambulatory Visit (INDEPENDENT_AMBULATORY_CARE_PROVIDER_SITE_OTHER): Payer: Medicare Other | Admitting: Cardiovascular Disease

## 2015-10-20 ENCOUNTER — Ambulatory Visit (INDEPENDENT_AMBULATORY_CARE_PROVIDER_SITE_OTHER): Payer: Medicare Other | Admitting: Pharmacist Clinician (PhC)/ Clinical Pharmacy Specialist

## 2015-10-20 ENCOUNTER — Telehealth: Payer: Self-pay | Admitting: Cardiovascular Disease

## 2015-10-20 VITALS — BP 92/54 | HR 72 | Resp 22 | Ht 71.0 in | Wt 147.1 lb

## 2015-10-20 DIAGNOSIS — I4891 Unspecified atrial fibrillation: Secondary | ICD-10-CM

## 2015-10-20 DIAGNOSIS — I5043 Acute on chronic combined systolic (congestive) and diastolic (congestive) heart failure: Secondary | ICD-10-CM

## 2015-10-20 DIAGNOSIS — I4821 Permanent atrial fibrillation: Secondary | ICD-10-CM

## 2015-10-20 DIAGNOSIS — Z7901 Long term (current) use of anticoagulants: Secondary | ICD-10-CM | POA: Diagnosis not present

## 2015-10-20 DIAGNOSIS — I442 Atrioventricular block, complete: Secondary | ICD-10-CM

## 2015-10-20 DIAGNOSIS — I482 Chronic atrial fibrillation: Secondary | ICD-10-CM | POA: Diagnosis not present

## 2015-10-20 DIAGNOSIS — I255 Ischemic cardiomyopathy: Secondary | ICD-10-CM | POA: Diagnosis not present

## 2015-10-20 DIAGNOSIS — I5042 Chronic combined systolic (congestive) and diastolic (congestive) heart failure: Secondary | ICD-10-CM

## 2015-10-20 DIAGNOSIS — Z950 Presence of cardiac pacemaker: Secondary | ICD-10-CM

## 2015-10-20 DIAGNOSIS — I5084 End stage heart failure: Secondary | ICD-10-CM

## 2015-10-20 DIAGNOSIS — I509 Heart failure, unspecified: Secondary | ICD-10-CM

## 2015-10-20 DIAGNOSIS — Z7189 Other specified counseling: Secondary | ICD-10-CM

## 2015-10-20 LAB — POCT INR: INR: 3.7

## 2015-10-20 MED ORDER — HYDROCODONE-ACETAMINOPHEN 5-325 MG PO TABS: 0.5000 | ORAL_TABLET | ORAL | Status: AC | PRN

## 2015-10-20 NOTE — Progress Notes (Signed)
Patient ID: [name], male   DOB: [birth year], 81 y.o.   MRN: [id number]      Cardiology Office Note   Date:  10/22/2015   ID:  [name], DOB [birth year], MRN [id number]  PCP:  Thalia Party  Cardiologist:    [name], MD   Chief Complaint  Patient presents with  . Follow-up    no chest pain, no shortness of breath, has edema, has pain and cramping in the legs, has lightheadedness and dizziness      History of Present Illness: [name] is a 81 y.o. male who presents for advanced heart failure  Over the last several months he has been declining and it is harder and harder to achieve euvolemic state and symptom relief. He continues to have problems with persistent swelling and poor wound healing in the right leg. He has dyspnea with minimal activity, but CHF meds and diuretics are limited by symptomatic hypotension. The frequency of diuretic dose adjustment has increased drastically and we are corresponding with his daughter [name] almost daily to help keep him balanced. [name] is troubled a lot by neck and back pain.  After lengthy consideration, [name] has agreed to consider palliative care. She is afraid how her father will react to the "H word" (hospice), but I believe Ransom understands quite well where his illness is going. We have enlisted palliative care services, but she wants to be sure she is present at the initial evaluation.  BiV pacemaker checked today shows almost 100% CRT, pacemaker dependent, >6 years battery longevity, normal lead parameters. Steady Corvue thoracic impedance lately, but a slow inexorable decline over the last 12 months suggests chronic pulmonary edema.    Past Medical History  Diagnosis Date  . Ischemic cardiomyopathy     EF-35-45%by echo in 2011; first dx in the 90's but again in 2009 & rec'd 1st BiV ICD   . Paroxysmal atrial fibrillation (HCC)     sinus rhythm on amiodarone  . Asbestos exposure     plaques on CT  . Compression fracture      T7  . Hypertension   . Coronary artery disease     last cath 2009, hx CABG  . Automatic implantable cardioverter-defibrillator in situ     now explanted  . Hypothyroidism   . CHF (congestive heart failure) (HCC)     hx.  . Myocardial infarction [facility])     INFERIOR [id number] wth PTCA; Ant MI 1995 wth PTCA   . Shortness of breath     "only when external pacemaker wasn't working right" (06/17/2013)  . Arthritis     "a touch all over" (06/17/2013)  . COPD (chronic obstructive pulmonary disease) ([place])     "a touch" (8/4//2014)  . Atrial fibrillation, permanent ([place]) 07/22/2013  . Biventricular cardiac pacemaker in situ 06/2013    St. Jude Allura (pt no longer wished for ICD portion)  . Pacemaker   . CHB (complete heart block) ([place]) 11/26/2014  . History of echocardiogram     Echo 11/16: EF 20-25%, diffuse HK with inferior AK, probable mild AF, severe MR, severe BAE, moderate TR, PASP 33 mmHg    Past Surgical History  Procedure Laterality Date  . Inguinal hernia repair Right   . Coronary artery bypass graft  11/01/1994    SVG to first diagonal, to distal RCA, to the ramus intermedius artery, and LIMA the LAD  . 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 Cataract extraction w/ intraocular lens  implant, bilateral Bilateral   . Icd lead removal Left 05/27/2013    Procedure: ICD LEAD REMOVAL;  Surgeon: Evans Lance, MD;  Location: [place];  Service: Cardiovascular;  Laterality: Left;  . Bi-ventricular pacemaker insertion (crt-p) Right 06/17/2013    St Jude  . Pacemaker removal Left 05/2013  . Coronary angioplasty with stent placement  12/17/2001    to VG-diag wth Zeta stent  . Cardiac catheterization  2009    Patent LIMA-LAD; patent VG-diag wth mild in-stent restenosis; patent VG-ramus intemedius;  patent VG-RCA wth 70% mid post lat stenosis  . Cardiac catheterization  03/16/2006    patent LIMA, patent VGs;  EF 30%  . Cardioversion  10/12/2010    successful cardioversion  . Coronary angioplasty  [phone]    LAD;Inf MI PTCA-RCA; ant MI PTCA- LAD  . Implantable cardioverter defibrillator (icd) generator change N/A 10/26/2012    Procedure: ICD GENERATOR CHANGE;  Surgeon: [name], MD;  Location: [facility] CATH LAB;  Service: Cardiovascular;  Laterality: N/A;  . Bi-ventricular pacemaker insertion N/A 06/17/2013    Procedure: BI-VENTRICULAR PACEMAKER INSERTION (CRT-P);  Surgeon: Evans Lance, MD;  Location: [facility] CATH LAB;  Service: Cardiovascular;  Laterality: N/A;     Current Outpatient Prescriptions  Medication Sig Dispense Refill  . allopurinol (ZYLOPRIM) 300 MG tablet Take 0.5 tablets (150 mg total) by mouth daily. 30 tablet 6  . bimatoprost (LUMIGAN) 0.03 % ophthalmic solution Place 1 drop into both eyes at bedtime.     . budesonide-formoterol (SYMBICORT) 160-4.5 MCG/ACT inhaler Inhale 2 puffs into the lungs 2 (two) times daily. 1 Inhaler 5  . captopril (CAPOTEN) 12.5 MG tablet Take 0.5 tablets (6.25 mg total) by mouth 2 (two) times daily. 30 tablet 10  . digoxin (LANOXIN) 0.125 MG tablet Take 1 tablet (0.125 mg total) by mouth at bedtime. 90 tablet 3  . docusate sodium (COLACE) 100 MG capsule Take 300 mg by mouth at bedtime.     . dorzolamide-timolol (COSOPT) 22.3-6.8 MG/ML ophthalmic solution Place 1 drop into both eyes 2 (two) times daily.     Marland Kitchen FIBER PO Take 5 capsules by mouth at bedtime.    . furosemide (LASIX) 80 MG tablet Take 1 tablet (80 mg total) by mouth daily. (Patient taking differently: Take 80 mg by mouth daily with breakfast. ) 60 tablet 11  . Iron-Vitamin C (VITRON-C PO) Take 1 tablet by mouth daily.     Marland Kitchen levothyroxine (SYNTHROID, LEVOTHROID) 125 MCG tablet Take 125 mcg by mouth daily before breakfast.    . LUMIGAN 0.01 % SOLN PLACE 1 DROP INTO BOTH EYES AT  BEDTIME  4  . magnesium oxide (MAG-OX) 400 MG tablet Take 400 mg by mouth daily.    . metolazone (ZAROXOLYN) 2.5 MG tablet TAKE 2 TIMES A WEEK OR AS NEEDED TO KEEP WEIGHT DOWN TO LESS THAN 160 LBS. (Patient taking differently: TAKE 2 TIMES A WEEK OR AS NEEDED TO KEEP WEIGHT DOWN TO LESS THAN 153 LBS.) 30 tablet 4  . Omega 3-6-9 Fatty Acids (TRIPLE OMEGA-3-6-9) CAPS Take 1 capsule by mouth 2 (two) times daily.     . pantoprazole (PROTONIX) 40 MG tablet Take 1 tablet (40 mg total) by mouth daily. 30 tablet 11  . potassium chloride (K-DUR,KLOR-CON) 10 MEQ tablet Take  60 meq in AM/ 40 meq in PM daily; take extra 20 meq on metolazone days    . warfarin (COUMADIN) 5 MG tablet Take 2.5-5 mg by mouth daily. Takes 1 tablet (5mg ) daily except 1/2 tablet (2.5 mg) every Tues and Sat.    . warfarin (COUMADIN) 5 MG tablet TAKE 1 TABLET BY MOUTH DAILY AS DIRECTED 90 tablet 1  . HYDROcodone-acetaminophen (NORCO/VICODIN) 5-325 MG tablet Take 0.5-1 tablets by mouth every 4 (four) hours as needed for moderate pain. 60 tablet 0   No current facility-administered medications for this visit.    Allergies:   Chocolate flavor; Ciprofloxacin; and Other    Social History:  The patient  reports that he has never smoked. He has never used smokeless tobacco. He reports that he drinks about 8.4 oz of alcohol per week. He reports that he does not use illicit drugs.   Family History:  The patient's family history includes Heart disease in his father.    ROS:  Please see the history of present illness.    Otherwise, review of systems positive for fatigue, somnolence, joint pain.   All other systems are reviewed and negative.    PHYSICAL EXAM: VS:  BP 92/54 mmHg  Pulse 72  Resp 22  Ht 5\' 11"  (1.803 m)  Wt 147 lb 1 oz (66.707 kg)  BMI 20.52 kg/m2 , BMI Body mass index is 20.52 kg/(m^2).  General: Alert, oriented x3, no acute distress  Head: no evidence of trauma, PERRL, EOMI, no exophtalmos or lid lag, no myxedema,  no xanthelasma; normal ears, nose and oropharynx  Neck: jugular venous pulsations are elevated 4-5 cm and there is prompt hepatojugular reflux; v waves are very prominent; brisk carotid pulses without delay and no carotid bruits  Chest: clear to auscultation, no signs of consolidation by percussion or palpation, normal fremitus, symmetrical and full respiratory excursions. Both the old device sites and the new CRT pacemaker site appeared healthy without evidence of infection  Cardiovascular: normal position and quality of the apical impulse, regular rhythm, normal first and paradoxically split second heart sounds, no rubs. S3 gallop present, 2 / 6 holosystolic murmur at the left lower sternal border  Abdomen: no tenderness or distention, no masses by palpation, no abnormal pulsatility or arterial bruits, normal bowel sounds, no hepatosplenomegaly  Extremities: no clubbing, cyanosis; There is trivial edema in the left ankle ; there is severe 3+ pitting edema in the entire right calf ; there is a 6 cm circumference difference in favor of the right calf , which also was warm and red; 2+ radial, ulnar and brachial pulses bilaterally; 2+ right femoral, posterior tibial and dorsalis pedis pulses; 2+ left femoral, posterior tibial and dorsalis pedis pulses; no subclavian or femoral bruits  Roughly 3 cm in diameter crusted yellowish ulceration on the left anterior shin. There is erythema and swelling in the surrounding soft tissue, slight warmth Neurological: grossly nonfocal Psych: euthymic mood, full affect    Recent Labs: 08/25/2015: Hemoglobin 12.7*; Platelets 113* 09/07/2015: ALT 15 10/12/2015: BUN 60*; Creat 1.33*; Potassium 3.8; Sodium 132*    Lipid Panel    Component Value Date/Time   CHOL 152 12/13/2012 0950   TRIG 159* 12/13/2012 0950   HDL 28* 12/13/2012 0950   CHOLHDL 5.4 12/13/2012 0950   VLDL 32 12/13/2012 0950   LDLCALC 92 12/13/2012 0950      Wt Readings from Last 3  Encounters:  10/20/15 147 lb 1 oz (66.707 kg)  09/08/15 149 lb 9.6  oz (67.858 kg)  09/02/15 154 lb 6.4 oz (70.035 kg)      ASSESSMENT AND PLAN:  Stage D (terminal) CHF despite aggressive attempts tat medical management with tailored diuretic therapy and device therapy (CRT, optimized via echo) in an elderly gentleman with many serious comorbid conditions.  I believe palliative care is appropriate, but will continue all treatments that provide symptom relief. Have to stop meds with adverse effects and he can no longer tolerate ACEi due to hypotension.  Discussed goals of care with Najee and his daughter, ANN. Will have a meeting with palliative care ( I spoke with Merrily Pew, hospice NP by phone) and then review his meds and treatments one by one.  Analgesics, including opiates are appropriate for his pain.    Current medicines are reviewed at length with the patient today.  The patient does not have concerns regarding medicines.  The following changes have been made:  Stop captopril - begin by stopping the morning dose, stop the PM dose as well if BP still low  Labs/ tests ordered today include:  Orders Placed This Encounter  Procedures  . Implantable device check    Patient Instructions  Dr. Sallyanne Kuster recommends that you schedule a follow-up appointment in: [place], Macil Crady, MD  10/22/2015 11:05 AM    [name], MD, [facility] [phone] office [phone] pager

## 2015-10-20 NOTE — Telephone Encounter (Signed)
Spoke with Billey Chang, NP. Gave verbal order for hospice referral. He will coordinate with the patient and his daughter, [name]. Larena Sox that family did not refuse services, but wanted for both the patient and his daughter to be present when the assessment was made.

## 2015-10-20 NOTE — Patient Instructions (Signed)
Dr. [name] recommends that you schedule a follow-up appointment in: 3 MONTHS   

## 2015-10-20 NOTE — Telephone Encounter (Signed)
Incoming call from [place], NP w/ [facility].  He is calling to get update regarding pt and recommendations - if hospice order can be resent.  States he received an initial order but family had refused services the 1st time. Wanted to get update on what was discussed today at visit.   Dr. Victorino December office visit note is not finished so I will defer to him to address. Merrily Pew may be reached at [phone]

## 2015-10-21 ENCOUNTER — Telehealth: Payer: Self-pay | Admitting: Cardiovascular Disease

## 2015-10-21 LAB — CUP PACEART INCLINIC DEVICE CHECK
Brady Statistic RV Percent Paced: 99.08 %
Date Time Interrogation Session: 20161206162900
Implantable Lead Implant Date: 20140804
Implantable Lead Location: 753858
Lead Channel Impedance Value: 562.5 Ohm
Lead Channel Pacing Threshold Amplitude: 1 V
Lead Channel Pacing Threshold Pulse Width: 0.4 ms
Lead Channel Pacing Threshold Pulse Width: 0.7 ms
Lead Channel Pacing Threshold Pulse Width: 0.7 ms
Lead Channel Setting Pacing Amplitude: 2.5 V
Lead Channel Setting Pacing Amplitude: 2.5 V
Lead Channel Setting Pacing Pulse Width: 0.4 ms
Lead Channel Setting Pacing Pulse Width: 0.7 ms
Lead Channel Setting Sensing Sensitivity: 6 mV
MDC IDC LEAD IMPLANT DT: 20140804
MDC IDC LEAD LOCATION: 753860
MDC IDC MSMT BATTERY REMAINING LONGEVITY: 79.2
MDC IDC MSMT BATTERY VOLTAGE: 2.96 V
MDC IDC MSMT LEADCHNL LV PACING THRESHOLD AMPLITUDE: 1 V
MDC IDC MSMT LEADCHNL RV IMPEDANCE VALUE: 437.5 Ohm
MDC IDC MSMT LEADCHNL RV PACING THRESHOLD AMPLITUDE: 0.75 V
MDC IDC MSMT LEADCHNL RV PACING THRESHOLD AMPLITUDE: 0.75 V
MDC IDC MSMT LEADCHNL RV PACING THRESHOLD PULSEWIDTH: 0.4 ms
MDC IDC MSMT LEADCHNL RV SENSING INTR AMPL: 11.2 mV
MDC IDC STAT BRADY RA PERCENT PACED: 0 %
Pulse Gen Model: 3242
Pulse Gen Serial Number: 7515957

## 2015-10-21 NOTE — Telephone Encounter (Signed)
Returned call to Amy with Hospice.Dr.[name]'s recommendations given.

## 2015-10-21 NOTE — Telephone Encounter (Signed)
Received a call from [name] with Hospice.She wanted to ask Dr.[name] if he wanted to be attending physician and does he want Hospice to do symptom management or does he want to.Message sent to Dr.Croitiru for advice.

## 2015-10-21 NOTE — Telephone Encounter (Signed)
I can be the attending physician. They can manage symptom management. Thanks

## 2015-10-22 ENCOUNTER — Encounter: Payer: Self-pay | Admitting: Cardiovascular Disease

## 2015-10-22 ENCOUNTER — Telehealth: Payer: Self-pay

## 2015-10-22 DIAGNOSIS — I5084 End stage heart failure: Secondary | ICD-10-CM | POA: Insufficient documentation

## 2015-10-22 DIAGNOSIS — Z7189 Other specified counseling: Secondary | ICD-10-CM | POA: Insufficient documentation

## 2015-10-22 NOTE — Telephone Encounter (Signed)
Received incoming call from daughter [name].  She reported patient was accepted into palliative care.  His dizziness and BP have improved since the Captopril dosage was adjusted.  She stated his leg sores are not infected which are good news.  She wanted to call and thank me for helping her and patient.  She stated everyone has given such excellent care.  She stated Dr Sallyanne Kuster will continue to be his physician and manage his care.  I advised will contact her after the the next transmission.

## 2015-10-26 ENCOUNTER — Telehealth: Payer: Self-pay | Admitting: Pharmacist Clinician (PhC)/ Clinical Pharmacy Specialist

## 2015-10-26 NOTE — Telephone Encounter (Signed)
I guess hold one lasix dose then

## 2015-10-26 NOTE — Telephone Encounter (Signed)
Yes, stop captopril. He had a lot of leg edema when I saw him in clinic. Is it really all gone?

## 2015-10-26 NOTE — Telephone Encounter (Signed)
Nurse seems to think so, but I don't know her...  His weight in the office last week was at 147 and she reports it at 139 today.  That seems like a huge difference in less than a week, even accounting for scale variability.

## 2015-10-26 NOTE — Telephone Encounter (Signed)
Sheri from Hospice called, she will be following [name].  Concerned today about low BP 84/50.  Also about dose of furosemide at 80 mg bid.  She feels that he looks "dry" today, with no edema or concerns.  She has a dry weight listed at 143 lbs, however he is at 139 lb at her visit today.  Advised her that based on office visit with Dr. Loletha Grayer last week, she can discontinue the captopril and I will pass information about furosemide to MD.  Also she will repeat INR for [place] on Wednesday at her return visit.

## 2015-10-27 NOTE — Telephone Encounter (Signed)
Information relayed to [facility].  She will see patient Wednesday for follow up weight, BP and INR

## 2015-10-28 ENCOUNTER — Ambulatory Visit (INDEPENDENT_AMBULATORY_CARE_PROVIDER_SITE_OTHER): Payer: Medicare Other | Admitting: Pharmacist Clinician (PhC)/ Clinical Pharmacy Specialist

## 2015-10-28 ENCOUNTER — Telehealth: Payer: Self-pay | Admitting: Cardiology

## 2015-10-28 ENCOUNTER — Ambulatory Visit (INDEPENDENT_AMBULATORY_CARE_PROVIDER_SITE_OTHER): Payer: Medicare Other

## 2015-10-28 DIAGNOSIS — Z950 Presence of cardiac pacemaker: Secondary | ICD-10-CM | POA: Diagnosis not present

## 2015-10-28 DIAGNOSIS — I255 Ischemic cardiomyopathy: Secondary | ICD-10-CM

## 2015-10-28 DIAGNOSIS — I5043 Acute on chronic combined systolic (congestive) and diastolic (congestive) heart failure: Secondary | ICD-10-CM | POA: Diagnosis not present

## 2015-10-28 DIAGNOSIS — I482 Chronic atrial fibrillation: Secondary | ICD-10-CM

## 2015-10-28 DIAGNOSIS — I4821 Permanent atrial fibrillation: Secondary | ICD-10-CM

## 2015-10-28 DIAGNOSIS — R600 Localized edema: Secondary | ICD-10-CM

## 2015-10-28 LAB — PROTIME-INR: INR: 3 — AB (ref <=1.1)

## 2015-10-28 NOTE — Telephone Encounter (Signed)
Confirmed remote transmission w/ pt daughter.   

## 2015-10-30 ENCOUNTER — Telehealth: Payer: Self-pay | Admitting: *Deleted

## 2015-10-30 NOTE — Telephone Encounter (Signed)
Signed orders for Hospice/Palliative care for assessment and treat with plan of care faxed [phone].  Spoke with daughter, [name], to see how Mr. Carbary is accepting Hospice/Palliative care.  States she understands this is important and time for help.

## 2015-10-30 NOTE — Progress Notes (Signed)
EPIC Encounter for ICM Monitoring  Patient Name: [name] is a 81 y.o. male Date: 10/30/2015 Primary Care Physican: [name],[name] Primary Cardiologist: [name] Electrophysiologist: [name] Dry Weight: 143.6 lb       In the past month, have you:  1. Gained more than 2 pounds in a day or more than 5 pounds in a week? Yes, 138 lbs to 143.6 lbs  2. Had changes in your medications (with verification of current medications)? no  3. Had more shortness of breath than is usual for you? no  4. Limited your activity because of shortness of breath? no  5. Not been able to sleep because of shortness of breath? no  6. Had increased swelling in your feet or ankles? no  7. Had symptoms of dehydration (dizziness, dry mouth, increased thirst, decreased urine output) no  8. Had changes in sodium restriction? no  9. Been compliant with medication? Yes   ICM trend: 1 year view   ICM trend 3 month view   Follow-up plan: ICM clinic phone appointment on 11/18/2015. Spoke with daughter, [name].  Jeannine Kitten daily impedance below baseline 10/18/2015 to 10/28/2015 with 1 day back to baseline suggesting fluid retention.  She reported patients weight in last week had dropped to 138 and daughter decreased Furosemide to 40 mg a day for a couple of days and no Metolazone in last week.  Today's weight is 143.6 and he will be dosed with Furosemide and Metolazone today.   She denied he has any leg swelling.   Advised with dose of Metolazone and prescribed Furosemide dose today, should improve fluid readings on device.  Advised to continue to follow medication plan from Dr Sallyanne Kuster based on goal weight of 143 lbs.  No changes today and education given to call if needed.  She stated Palliative care has been very helpful.    Copy of note sent to patient's primary care physician, primary cardiologist, and device following physician.  Rosalene Billings, RN, CCM 10/30/2015 1:11 PM

## 2015-11-04 ENCOUNTER — Ambulatory Visit (INDEPENDENT_AMBULATORY_CARE_PROVIDER_SITE_OTHER): Payer: Medicare Other | Admitting: Pharmacist Clinician (PhC)/ Clinical Pharmacy Specialist

## 2015-11-04 ENCOUNTER — Telehealth: Payer: Self-pay | Admitting: *Deleted

## 2015-11-04 DIAGNOSIS — I482 Chronic atrial fibrillation: Secondary | ICD-10-CM

## 2015-11-04 DIAGNOSIS — I4821 Permanent atrial fibrillation: Secondary | ICD-10-CM

## 2015-11-04 LAB — POCT INR: INR: 2.4

## 2015-11-04 NOTE — Telephone Encounter (Signed)
Ann notified of normal Dig level.  States her dad is doing OK. May get out a bit today if he's up to it.  Asked that she call if there is anything we can do.  Voiced appreciation and understanding.

## 2015-11-04 NOTE — Telephone Encounter (Signed)
Signed order for SN and lab draw faxed 11/03/15 faxed to [place].

## 2015-11-10 ENCOUNTER — Ambulatory Visit: Payer: Medicare Other | Admitting: Podiatry

## 2015-11-11 ENCOUNTER — Telehealth: Payer: Self-pay | Admitting: Cardiovascular Disease

## 2015-11-11 ENCOUNTER — Ambulatory Visit (INDEPENDENT_AMBULATORY_CARE_PROVIDER_SITE_OTHER): Payer: Medicare Other | Admitting: Pharmacist Clinician (PhC)/ Clinical Pharmacy Specialist

## 2015-11-11 DIAGNOSIS — I4821 Permanent atrial fibrillation: Secondary | ICD-10-CM

## 2015-11-11 DIAGNOSIS — I482 Chronic atrial fibrillation: Secondary | ICD-10-CM

## 2015-11-11 LAB — POCT INR: INR: 3

## 2015-11-11 NOTE — Telephone Encounter (Signed)
Sherri from hospice called wanted to give message to [place]: INR is 3.0  Patient has been taking quite a bit of tylenol for his left leg stasis ulcer.  Wife undersood that tylenol can affect INR so on Sunday she gave him 2.5 mg of coumadin instead of 5 mg  Routed to [employer]

## 2015-11-11 NOTE — Telephone Encounter (Signed)
[name] is calling in wanting to speak with a nurse about a pt. Please f/u with her   Thanks

## 2015-11-13 ENCOUNTER — Telehealth: Payer: Self-pay | Admitting: Pharmacist Clinician (PhC)/ Clinical Pharmacy Specialist

## 2015-11-13 ENCOUNTER — Ambulatory Visit: Payer: Medicare Other | Admitting: Podiatry

## 2015-11-13 ENCOUNTER — Encounter: Payer: Self-pay | Admitting: Cardiovascular Disease

## 2015-11-13 NOTE — Telephone Encounter (Signed)
Spoke with Jan at [facility], Mr. Layman has worsening cellulitis on his leg and nurse would like order for antibiotic as well as permission to clean/debride his ulcers.  Daughter took picture of wounds this am and sent to Hospice, who then forwarded them to me.    Reviewed with Dr. Oval Linsey and viewed pictures.    LMOM with Hospice, ok to start patient on cephalexin 500 mg qid x 7 days, will review need for further antibiotic at that time.  Also approved to clean and debride his ulcers.

## 2015-11-18 ENCOUNTER — Ambulatory Visit (INDEPENDENT_AMBULATORY_CARE_PROVIDER_SITE_OTHER): Payer: Medicare Other

## 2015-11-18 ENCOUNTER — Telehealth: Payer: Self-pay | Admitting: Cardiology

## 2015-11-18 ENCOUNTER — Ambulatory Visit (INDEPENDENT_AMBULATORY_CARE_PROVIDER_SITE_OTHER): Payer: Medicare Other | Admitting: Pharmacist Clinician (PhC)/ Clinical Pharmacy Specialist

## 2015-11-18 DIAGNOSIS — I4821 Permanent atrial fibrillation: Secondary | ICD-10-CM

## 2015-11-18 DIAGNOSIS — I482 Chronic atrial fibrillation: Secondary | ICD-10-CM

## 2015-11-18 DIAGNOSIS — Z950 Presence of cardiac pacemaker: Secondary | ICD-10-CM

## 2015-11-18 DIAGNOSIS — I5043 Acute on chronic combined systolic (congestive) and diastolic (congestive) heart failure: Secondary | ICD-10-CM | POA: Diagnosis not present

## 2015-11-18 LAB — POCT INR: INR: 2.5

## 2015-11-18 NOTE — Telephone Encounter (Signed)
Confirmed remote transmission w/ pt daughter.   

## 2015-11-19 ENCOUNTER — Telehealth: Payer: Self-pay | Admitting: *Deleted

## 2015-11-19 NOTE — Telephone Encounter (Signed)
PT/INR and wound care RLL order signed and faxed.

## 2015-11-20 NOTE — Progress Notes (Signed)
EPIC Encounter for ICM Monitoring  Patient Name: [name] is a 82 y.o. male Date: 11/20/2015 Primary Care Physican: [name],[name] Primary Cardiologist: [name] Electrophysiologist: Lovena Le Dry Weight: 143 lb       In the past month, have you:  1. Gained more than 2 pounds in a day or more than 5 pounds in a week? no  2. Had changes in your medications (with verification of current medications)? Yes in last week and was resolved by Metolazone dose  3. Had more shortness of breath than is usual for you? no  4. Limited your activity because of shortness of breath? no  5. Not been able to sleep because of shortness of breath? no  6. Had increased swelling in your feet or ankles? no  7. Had symptoms of dehydration (dizziness, dry mouth, increased thirst, decreased urine output) no  8. Had changes in sodium restriction? no  9. Been compliant with medication? Yes   ICM trend: 3 view month   ICM Trend:  1 year view  Follow-up plan: ICM clinic phone appointment on 12/24/2015. Spoke with daughter [name].  Corvue impedance below baseline 11/15/2015 to 11/17/2015 suggesting fluid retention.  She stated he had 3 lb weight gain and SOB at night when lying down.   She stated the extra Metolazone resolved weight gain and dyspnea.  She stated he has been feeling better the last couple of days.  She stated hospice/palliative care is coming to the home.  No changes today.   Copy of note sent to patient's primary care physician, primary cardiologist, and device following physician.  Rosalene Billings, RN, CCM 11/20/2015 4:01 PM

## 2015-11-23 ENCOUNTER — Encounter: Attending: Surgery | Admitting: Surgery

## 2015-11-23 DIAGNOSIS — Z950 Presence of cardiac pacemaker: Secondary | ICD-10-CM | POA: Diagnosis not present

## 2015-11-23 DIAGNOSIS — E039 Hypothyroidism, unspecified: Secondary | ICD-10-CM | POA: Diagnosis not present

## 2015-11-23 DIAGNOSIS — I872 Venous insufficiency (chronic) (peripheral): Secondary | ICD-10-CM | POA: Insufficient documentation

## 2015-11-23 DIAGNOSIS — I255 Ischemic cardiomyopathy: Secondary | ICD-10-CM | POA: Insufficient documentation

## 2015-11-23 DIAGNOSIS — J449 Chronic obstructive pulmonary disease, unspecified: Secondary | ICD-10-CM | POA: Diagnosis not present

## 2015-11-23 DIAGNOSIS — I4891 Unspecified atrial fibrillation: Secondary | ICD-10-CM | POA: Diagnosis not present

## 2015-11-23 DIAGNOSIS — H409 Unspecified glaucoma: Secondary | ICD-10-CM | POA: Insufficient documentation

## 2015-11-23 DIAGNOSIS — I83212 Varicose veins of right lower extremity with both ulcer of calf and inflammation: Secondary | ICD-10-CM | POA: Insufficient documentation

## 2015-11-23 DIAGNOSIS — N189 Chronic kidney disease, unspecified: Secondary | ICD-10-CM | POA: Diagnosis not present

## 2015-11-23 DIAGNOSIS — I5022 Chronic systolic (congestive) heart failure: Secondary | ICD-10-CM | POA: Insufficient documentation

## 2015-11-24 ENCOUNTER — Other Ambulatory Visit
Admission: RE | Admit: 2015-11-24 | Discharge: 2015-11-24 | Disposition: A | Discharge status: Home / Self Care | Source: Ambulatory Visit | Attending: Surgery | Admitting: Surgery

## 2015-11-24 DIAGNOSIS — L97509 Non-pressure chronic ulcer of other part of unspecified foot with unspecified severity: Secondary | ICD-10-CM | POA: Diagnosis not present

## 2015-11-24 NOTE — Progress Notes (Signed)
THARUN, PUCK ([id number]) Visit Report for 11/23/2015 Allergy List Details Patient Name: [name], [name] Date of Service: 11/23/2015 1:30 PM Medical Record Number: [id number] Patient Account Number: [id number] Date of Birth/Sex: Nov 06, 1926 (82 y.o. Male) Treating RN: Macarthur Critchley Primary Care Physician: Thalia Party Other Clinician: Referring Physician: Treating Physician/Extender: [name] in Treatment: 0 Electronic Signature(s) Signed: 11/23/2015 4:18:47 PM By: Rebecca Eaton, RN, Sendra Entered By: Rebecca Eaton RN, Sendra on 11/23/2015 13:56:31 [name], [name] ([id number]) -------------------------------------------------------------------------------- Arrival Information Details Patient Name: [name] Date of Service: 11/23/2015 1:30 PM Medical Record Number: [id number] Patient Account Number: [id number] Date of Birth/Sex: 04/03/1926 (82 y.o. Male) Treating RN: Macarthur Critchley Primary Care Physician: Thalia Party Other Clinician: Referring Physician: Treating Physician/Extender: [name] in Treatment: 0 Visit Information Patient Arrived: Ambulatory Arrival Time: 13:47 Accompanied By: son Transfer Assistance: None Patient Identification Verified: Yes Secondary Verification Process Yes Completed: Patient Requires Transmission- No Based Precautions: Patient Has Alerts: Yes Patient Alerts: non compressible 11/23/15 Electronic Signature(s) Signed: 11/23/2015 4:18:47 PM By: Rebecca Eaton, RN, Sendra Entered By: Rebecca Eaton RN, Sendra on 11/23/2015 14:11:07 [name], [name] ([id number]) -------------------------------------------------------------------------------- Clinic Level of Care Assessment Details Patient Name: [name] Date of Service: 11/23/2015 1:30 PM Medical Record Number: [id number] Patient Account Number: [id number] Date of Birth/Sex: 1925-12-11 (82 y.o. Male) Treating RN: Ahmed Prima Primary Care Physician: Thalia Party Other  Clinician: Referring Physician: Treating Physician/Extender: [name] in Treatment: 0 Clinic Level of Care Assessment Items TOOL 1 Quantity Score []  - Use when EandM and Procedure is performed on INITIAL visit 0 ASSESSMENTS - Nursing Assessment / Reassessment []  - General Physical Exam (combine w/ comprehensive assessment (listed just 0 below) when performed on new pt. evals) []  - Comprehensive Assessment (HX, ROS, Risk Assessments, Wounds Hx, etc.) 0 ASSESSMENTS - Wound and Skin Assessment / Reassessment []  - Dermatologic / Skin Assessment (not related to wound area) 0 ASSESSMENTS - Ostomy and/or Continence Assessment and Care []  - Incontinence Assessment and Management 0 []  - Ostomy Care Assessment and Management (repouching, etc.) 0 PROCESS - Coordination of Care []  - Simple Patient / Family Education for ongoing care 0 []  - Complex (extensive) Patient / Family Education for ongoing care 0 []  - Staff obtains Programmer, systems, Records, Test Results / Process Orders 0 []  - Staff telephones HHA, Nursing Homes / Clarify orders / etc 0 []  - Routine Transfer to another Facility (non-emergent condition) 0 []  - Routine Hospital Admission (non-emergent condition) 0 []  - New Admissions / Biomedical engineer / Ordering NPWT, Apligraf, etc. 0 []  - Emergency Hospital Admission (emergent condition) 0 PROCESS - Special Needs []  - Pediatric / Minor Patient Management 0 []  - Isolation Patient Management 0 [name], [name] ([id number]) []  - Hearing / Language / Visual special needs 0 []  - Assessment of Community assistance (transportation, D/C planning, etc.) 0 []  - Additional assistance / Altered mentation 0 []  - Support Surface(s) Assessment (bed, cushion, seat, etc.) 0 INTERVENTIONS - Miscellaneous []  - External ear exam 0 []  - Patient Transfer (multiple staff / Civil Service fast streamer / Similar devices) 0 []  - Simple Staple / Suture removal (25 or less) 0 []  - Complex Staple / Suture removal  (26 or more) 0 []  - Hypo/Hyperglycemic Management (do not check if billed separately) 0 []  - Ankle / Brachial Index (ABI) - do not check if billed separately 0 Has the patient been seen at the hospital within the last three years: Yes Total Score: 0 Level Of Care: ____ Electronic Signature(s) Signed: 11/23/2015  4:59:01 PM By: Alric Quan Entered By: Alric Quan on 11/23/2015 14:46:58 [name], [name] ([id number]) -------------------------------------------------------------------------------- Encounter Discharge Information Details Patient Name: [name] Date of Service: 11/23/2015 1:30 PM Medical Record Number: [id number] Patient Account Number: [id number] Date of Birth/Sex: May 28, 1926 (82 y.o. Male) Treating RN: Macarthur Critchley Primary Care Physician: Thalia Party Other Clinician: Referring Physician: Treating Physician/Extender: [name] in Treatment: 0 Encounter Discharge Information Items Discharge Pain Level: 3 Discharge Condition: Stable Ambulatory Status: Ambulatory Discharge Destination: Home Transportation: Private Auto Accompanied By: son Schedule Follow-up Appointment: Yes Medication Reconciliation completed and provided to Patient/Care Yes [name]: Provided on Clinical Summary of Care: 11/23/2015 Form Type Recipient Paper Patient JS Electronic Signature(s) Signed: 11/23/2015 2:59:50 PM By: Ruthine Dose Entered By: Ruthine Dose on 11/23/2015 14:59:50 [name], [name] ([id number]) -------------------------------------------------------------------------------- General Visit Notes Details Patient Name: [name] Date of Service: 11/23/2015 1:30 PM Medical Record Number: [id number] Patient Account Number: [id number] Date of Birth/Sex: Jan 09, 1926 (82 y.o. Male) Treating RN: Macarthur Critchley Primary Care Physician: Thalia Party Other Clinician: Referring Physician: Treating Physician/Extender: [name] in Treatment:  0 Notes Spoke with Judeen Hammans with hospice, informed staff that client has been a patient of hospice for 1 month for CHF/ end of life expectancy. Family to discuss care and decide course of treatment. Electronic Signature(s) Signed: 11/23/2015 4:18:47 PM By: Rebecca Eaton, RN, Sendra Entered By: Rebecca Eaton RN, Sendra on 11/23/2015 14:58:01 Stevick, [name] ([id number]) -------------------------------------------------------------------------------- Lower Extremity Assessment Details Patient Name: [name] Date of Service: 11/23/2015 1:30 PM Medical Record Number: [id number] Patient Account Number: [id number] Date of Birth/Sex: [birth year] (82 y.o. Male) Treating RN: Macarthur Critchley Primary Care Physician: Thalia Party Other Clinician: Referring Physician: Treating Physician/Extender: [name] in Treatment: 0 Edema Assessment Assessed: [Left: No] [Right: No] Edema: [Left: Ye] [Right: s] Calf Left: Right: Point of Measurement: 35 cm From Medial Instep cm 32.5 cm Ankle Left: Right: Point of Measurement: 11 cm From Medial Instep cm 22.7 cm Vascular Assessment Pulses: Posterior Tibial Palpable: [Right:Yes] Dorsalis Pedis Palpable: [Right:Yes] Extremity colors, hair growth, and conditions: Extremity Color: [Right:Mottled] Hair Growth on Extremity: [Right:No] Temperature of Extremity: [Right:Cool] Capillary Refill: [Right:< 3 seconds] Dependent Rubor: [Right:No] Blanched when Elevated: [Right:No] Lipodermatosclerosis: [Right:No] Toe Nail Assessment Left: Right: Thick: Yes Discolored: No Deformed: No Improper Length and Hygiene: No Electronic Signature(s) [name], [name] ([id number]) Signed: 11/23/2015 4:18:47 PM By: Rebecca Eaton, RN, Sendra Entered By: Rebecca Eaton RN, Sendra on 11/23/2015 14:10:35 Gellert, [name] ([id number]) -------------------------------------------------------------------------------- Multi Wound Chart Details Patient Name: [name] Date  of Service: 11/23/2015 1:30 PM Medical Record Number: [id number] Patient Account Number: [id number] Date of Birth/Sex: 1926-03-26 (82 y.o. Male) Treating RN: Ahmed Prima Primary Care Physician: Thalia Party Other Clinician: Referring Physician: Treating Physician/Extender: [name] in Treatment: 0 Vital Signs Height(in): 71 Pulse(bpm): 72 Weight(lbs): 146 Blood Pressure 99/54 (mmHg): Body Mass Index(BMI): 20 Temperature(F): Respiratory Rate (breaths/min): Photos: [N/A:N/A] Wound Location: Right Lower Leg - Left Lower Leg - Distal N/A Proximal Wounding Event: Blister Blister N/A Primary Etiology: Venous Leg Ulcer Venous Leg Ulcer N/A Comorbid History: Cataracts, Glaucoma, Cataracts, Glaucoma, N/A Hypertension Hypertension Date Acquired: 10/15/2015 10/15/2015 N/A Weeks of Treatment: 0 0 N/A Wound Status: Open Open N/A Clustered Wound: No Yes N/A Measurements L x W x D 4.1x3.9x0.2 6x4.9x0.3 N/A (cm) Area (cm) : 12.559 23.091 N/A Volume (cm) : 2.512 6.927 N/A Classification: Full Thickness Without Full Thickness Without N/A Exposed Support Exposed Support Structures Structures Exudate Amount: Small Small N/A Exudate Type: Serosanguineous Serosanguineous  N/A Exudate Color: red, brown red, brown N/A Wound Margin: Flat and Intact Flat and Intact N/A Granulation Amount: None Present (0%) None Present (0%) N/A Necrotic Amount: Large (67-100%) Large (67-100%) N/A Laing, Dylan KARMEL. ([id number]) Epithelialization: None None N/A Periwound Skin Texture: No Abnormalities Noted No Abnormalities Noted N/A Periwound Skin Maceration: No Maceration: No N/A Moisture: Moist: No Moist: No Dry/Scaly: No Dry/Scaly: No Periwound Skin Color: Erythema: Yes Hemosiderin Staining: Yes N/A Hemosiderin Staining: Yes Erythema Location: Circumferential N/A N/A Temperature: No Abnormality N/A N/A Tenderness on Yes Yes N/A Palpation: Wound Preparation: Ulcer Cleansing: Ulcer  Cleansing: N/A Rinsed/Irrigated with Rinsed/Irrigated with Saline Saline Topical Anesthetic Topical Anesthetic Applied: Other: lidocaine Applied: Other: lidocaine 4% 4% Treatment Notes Electronic Signature(s) Signed: 11/23/2015 4:59:01 PM By: Alric Quan Entered By: Alric Quan on 11/23/2015 14:20:55 [place]combe, [name] ([id number]) -------------------------------------------------------------------------------- [facility] Details Patient Name: [name] Date of Service: 11/23/2015 1:30 PM Medical Record Number: [id number] Patient Account Number: [id number] Date of Birth/Sex: 02-Apr-1926 (82 y.o. Male) Treating RN: Ahmed Prima Primary Care Physician: Thalia Party Other Clinician: Referring Physician: Treating Physician/Extender: [name] in Treatment: 0 Active Inactive Electronic Signature(s) Signed: 11/23/2015 4:59:01 PM By: Alric Quan Entered By: Alric Quan on 11/23/2015 14:20:31 Cartwright, [name] ([id number]) -------------------------------------------------------------------------------- Pain Assessment Details Patient Name: [name] Date of Service: 11/23/2015 1:30 PM Medical Record Number: [id number] Patient Account Number: [id number] Date of Birth/Sex: [birth year] (82 y.o. Male) Treating RN: Macarthur Critchley Primary Care Physician: Thalia Party Other Clinician: Referring Physician: Treating Physician/Extender: [name] in Treatment: 0 Active Problems Location of Pain Severity and Description of Pain Patient Has Paino No Site Locations Rate the pain. Current Pain Level: 0 Pain Management and Medication Current Pain Management: Electronic Signature(s) Signed: 11/23/2015 4:18:47 PM By: Rebecca Eaton, RN, Sendra Entered By: Rebecca Eaton RN, Sendra on 11/23/2015 13:48:52 Delatorre, [name] ([id number]) -------------------------------------------------------------------------------- Patient/Caregiver Education  Details Patient Name: [name] Date of Service: 11/23/2015 1:30 PM Medical Record Number: [id number] Patient Account Number: [id number] Date of Birth/Gender: [birth year] (82 y.o. Male) Treating RN: Macarthur Critchley Primary Care Physician: Thalia Party Other Clinician: Referring Physician: Treating Physician/Extender: [name] in Treatment: 0 Education Assessment Education Provided To: Patient and Caregiver Education Topics Provided Venous: Handouts: Managing Venous Disease and Related Ulcers Methods: Explain/Verbal Responses: State content correctly Welcome To [facility]: Handouts: Welcome To [facility] Methods: Explain/Verbal Responses: State content correctly Wound/Skin Impairment: Handouts: Caring for Your Ulcer, Skin Care Do's and Dont's Methods: Explain/Verbal Responses: State content correctly Electronic Signature(s) Signed: 11/23/2015 4:18:47 PM By: Rebecca Eaton, RN, Sendra Entered By: Rebecca Eaton, RN, Sendra on 11/23/2015 14:14:25 Bouillon, [name] ([id number]) -------------------------------------------------------------------------------- Wound Assessment Details Patient Name: [name] Date of Service: 11/23/2015 1:30 PM Medical Record Number: [id number] Patient Account Number: [id number] Date of Birth/Sex: [birth year] (82 y.o. Male) Treating RN: Macarthur Critchley Primary Care Physician: Thalia Party Other Clinician: Referring Physician: Treating Physician/Extender: [name] in Treatment: 0 Wound Status Wound Number: 1 Primary Etiology: Venous Leg Ulcer Wound Location: Right Lower Leg - Proximal Wound Status: Open Wounding Event: Blister Comorbid Cataracts, Glaucoma, History: Hypertension Date Acquired: 10/15/2015 Weeks Of Treatment: 0 Clustered Wound: No Photos Photo Uploaded By: Montey Hora on 11/23/2015 14:20:28 Wound Measurements Length: (cm) 4.1 Width: (cm) 3.9 Depth: (cm) 0.2 Area: (cm)  12.559 Volume: (cm) 2.512 % Reduction in Area: % Reduction in Volume: Epithelialization: None Tunneling: No Undermining: No Wound Description Full Thickness Without Exposed Foul Odor After Classification: Support Structures Wound Margin:  Flat and Intact Exudate Small Amount: Exudate Type: Serosanguineous Exudate Color: red, brown Cleansing: No Wound Bed Granulation Amount: None Present (0%) Necrotic Amount: Large (67-100%) Necrotic Quality: [address], [place]. ([id number]) Periwound Skin Texture Texture Color No Abnormalities Noted: No No Abnormalities Noted: No Erythema: Yes Moisture Erythema Location: Circumferential No Abnormalities Noted: No Hemosiderin Staining: Yes Dry / Scaly: No Maceration: No Temperature / Pain Moist: No Temperature: No Abnormality Tenderness on Palpation: Yes Wound Preparation Ulcer Cleansing: Rinsed/Irrigated with Saline Topical Anesthetic Applied: Other: lidocaine 4%, Treatment Notes Wound #1 (Right, Proximal Lower Leg) 1. Cleansed with: Clean wound with Normal Saline 2. Anesthetic Topical Lidocaine 4% cream to wound bed prior to debridement 3. Peri-wound Care: Barrier cream 4. Dressing Applied: Santyl Ointment 5. Secondary Dressing Applied Guaze, ABD and kerlix/Conform Notes patient and family to talk over care and decide course of treatment Electronic Signature(s) Signed: 11/23/2015 4:18:47 PM By: Rebecca Eaton, RN, Sendra Entered By: Rebecca Eaton, RN, Sendra on 11/23/2015 14:07:20 Levick, [name] ([id number]) -------------------------------------------------------------------------------- Wound Assessment Details Patient Name: [name] Date of Service: 11/23/2015 1:30 PM Medical Record Number: [id number] Patient Account Number: [id number] Date of Birth/Sex: 1925-12-17 (82 y.o. Male) Treating RN: Macarthur Critchley Primary Care Physician: Thalia Party Other Clinician: Referring Physician: Treating  Physician/Extender: [name] in Treatment: 0 Wound Status Wound Number: 2 Primary Etiology: Venous Leg Ulcer Wound Location: Left Lower Leg - Distal Wound Status: Open Wounding Event: Blister Comorbid Cataracts, Glaucoma, History: Hypertension Date Acquired: 10/15/2015 Weeks Of Treatment: 0 Clustered Wound: Yes Photos Photo Uploaded By: Montey Hora on 11/23/2015 14:20:29 Wound Measurements Length: (cm) 6 Width: (cm) 4.9 Depth: (cm) 0.3 Area: (cm) 23.091 Volume: (cm) 6.927 % Reduction in Area: % Reduction in Volume: Epithelialization: None Tunneling: No Undermining: No Wound Description Full Thickness Without Exposed Foul Odor After Classification: Support Structures Wound Margin: Flat and Intact Exudate Small Amount: Exudate Type: Serosanguineous Exudate Color: red, brown Cleansing: No Wound Bed Granulation Amount: None Present (0%) Necrotic Amount: Large (67-100%) Necrotic Quality: [address], [place]. ([id number]) Periwound Skin Texture Texture Color No Abnormalities Noted: No No Abnormalities Noted: No Hemosiderin Staining: Yes Moisture No Abnormalities Noted: No Temperature / Pain Dry / Scaly: No Tenderness on Palpation: Yes Maceration: No Moist: No Wound Preparation Ulcer Cleansing: Rinsed/Irrigated with Saline Topical Anesthetic Applied: Other: lidocaine 4%, Treatment Notes Wound #2 (Left, Distal Lower Leg) 1. Cleansed with: Clean wound with Normal Saline 2. Anesthetic Topical Lidocaine 4% cream to wound bed prior to debridement 3. Peri-wound Care: Barrier cream 4. Dressing Applied: Santyl Ointment 5. Secondary Dressing Applied Guaze, ABD and kerlix/Conform Notes patient and family to talk over care and decide course of treatment Electronic Signature(s) Signed: 11/23/2015 4:18:47 PM By: Rebecca Eaton, RN, Sendra Entered By: Rebecca Eaton, RN, Sendra on 11/23/2015 14:08:27 Schoenfelder, [name]  ([id number]) -------------------------------------------------------------------------------- [place] Details Patient Name: [name] Date of Service: 11/23/2015 1:30 PM Medical Record Number: [id number] Patient Account Number: [id number] Date of Birth/Sex: 05/02/1926 (82 y.o. Male) Treating RN: Macarthur Critchley Primary Care Physician: Thalia Party Other Clinician: Referring Physician: Treating Physician/Extender: [name] in Treatment: 0 Vital Signs Time Taken: 13:49 Pulse (bpm): 72 Height (in): 71 Blood Pressure (mmHg): 99/54 Source: Stated Reference Range: 80 - 120 mg / dl Weight (lbs): 146 Source: Stated Body Mass Index (BMI): 20.4 Electronic Signature(s) Signed: 11/23/2015 4:18:47 PM By: Rebecca Eaton RN, Sendra Entered By: Rebecca Eaton RN, Sendra on 11/23/2015 13:51:05

## 2015-11-24 NOTE — Progress Notes (Signed)
Dylan, [name] ([id number]) Visit Report for 11/23/2015 Abuse/Suicide Risk Screen Details Patient Name: [name], [name] Date of Service: 11/23/2015 1:30 PM Medical Record Number: [id number] Patient Account Number: [id number] Date of Birth/Sex: 09-18-1926 (82 y.o. Male) Treating RN: Macarthur Critchley Primary Care Physician: Thalia Party Other Clinician: Referring Physician: Treating Physician/Extender: Frann Rider in Treatment: 0 Abuse/Suicide Risk Screen Items Answer ABUSE/SUICIDE RISK SCREEN: Has anyone close to you tried to hurt or harm you recentlyo No Do you feel uncomfortable with anyone in your familyo No Has anyone forced you do things that you didnot want to doo No Do you have any thoughts of harming yourselfo No Patient displays signs or symptoms of abuse and/or neglect. No Electronic Signature(s) Signed: 11/23/2015 4:18:47 PM By: Rebecca Eaton, RN, [place] Entered By: Rebecca Eaton RN, Sendra on 11/23/2015 14:01:32 Parrilla, Bufford Lope ([id number]) -------------------------------------------------------------------------------- Activities of Daily Living Details Patient Name: [name] Date of Service: 11/23/2015 1:30 PM Medical Record Number: [id number] Patient Account Number: [id number] Date of Birth/Sex: 09-30-1926 (82 y.o. Male) Treating RN: Macarthur Critchley Primary Care Physician: Thalia Party Other Clinician: Referring Physician: Treating Physician/Extender: Frann Rider in Treatment: 0 Activities of Daily Living Items Answer Activities of Daily Living (Please select one for each item) Drive Automobile Not Able Take Medications Completely Able Use Telephone Completely Able Care for Appearance Need Assistance Use Toilet Completely Able Bath / Shower Need Assistance Dress Self Completely Able Feed Self Completely Able Walk Completely Able Get In / Out Bed Completely [place] for Self Need Assistance Electronic Signature(s) Signed: 11/23/2015 4:18:47 PM By: Rebecca Eaton, RN, Sendra Entered By: Rebecca Eaton, RN, Sendra on 11/23/2015 14:02:29 [place], Bufford Lope ([id number]) -------------------------------------------------------------------------------- Education Assessment Details Patient Name: [name] Date of Service: 11/23/2015 1:30 PM Medical Record Number: [id number] Patient Account Number: [id number] Date of Birth/Sex: Jun 08, 1926 (82 y.o. Male) Treating RN: Macarthur Critchley Primary Care Physician: Thalia Party Other Clinician: Referring Physician: Treating Physician/Extender: Frann Rider in Treatment: 0 Primary Learner Assessed: Patient Learning Preferences/Education Level/Primary Language Learning Preference: Explanation Highest Education Level: High School Cognitive Barrier Assessment/Beliefs Language Barrier: No Translator Needed: No Memory Deficit: No Emotional Barrier: No Cultural/Religious Beliefs Affecting Medical No Care: Physical Barrier Assessment Impaired Vision: Yes Glasses Impaired Hearing: No Decreased Hand dexterity: No Knowledge/Comprehension Assessment Knowledge Level: High Comprehension Level: High Ability to understand written Medium instructions: Ability to understand verbal Medium instructions: Motivation Assessment Anxiety Level: Calm Cooperation: Cooperative Education Importance: Acknowledges Need Interest in Health Problems: Asks Questions Perception: Coherent Willingness to Engage in Self- High Management Activities: Readiness to Engage in Self- High Management Activities: Electronic Signature(s) Signed: 11/23/2015 4:18:47 PM By: Rebecca Eaton, RN, [address], [place] ([id number]) Entered By: Rebecca Eaton RN, Sendra on 11/23/2015 14:02:59 Raben, Bufford Lope ([id number]) -------------------------------------------------------------------------------- Fall Risk Assessment Details Patient Name:  [name] Date of Service: 11/23/2015 1:30 PM Medical Record Number: [id number] Patient Account Number: [id number] Date of Birth/Sex: January 25, 1926 (82 y.o. Male) Treating RN: Macarthur Critchley Primary Care Physician: Thalia Party Other Clinician: Referring Physician: Treating Physician/Extender: Frann Rider in Treatment: 0 Fall Risk Assessment Items Have you had 2 or more falls in the last 12 monthso 0 No Have you had any fall that resulted in injury in the last 12 monthso 0 No FALL RISK ASSESSMENT: History of falling - immediate or within 3 months 0 No Secondary diagnosis 15 Yes Ambulatory aid None/bed rest/wheelchair/nurse 0 No Crutches/cane/walker 0 No Furniture 0 No IV Access/Saline Lock 0  No Gait/Training Normal/bed rest/immobile 0 Yes Weak 0 No Impaired 0 No Mental Status Oriented to own ability 0 Yes Electronic Signature(s) Signed: 11/23/2015 4:18:47 PM By: Rebecca Eaton, RN, [place] Entered By: Rebecca Eaton, RN, Sendra on 11/23/2015 14:03:25 Ghazarian, Bufford Lope ([id number]) -------------------------------------------------------------------------------- Foot Assessment Details Patient Name: [name] Date of Service: 11/23/2015 1:30 PM Medical Record Number: [id number] Patient Account Number: [id number] Date of Birth/Sex: Sep 09, 1926 (82 y.o. Male) Treating RN: Macarthur Critchley Primary Care Physician: Thalia Party Other Clinician: Referring Physician: Treating Physician/Extender: Frann Rider in Treatment: 0 Foot Assessment Items Site Locations + = Sensation present, - = Sensation absent, C = Callus, U = Ulcer R = Redness, W = Warmth, M = Maceration, PU = Pre-ulcerative lesion F = Fissure, S = Swelling, D = Dryness Assessment Right: Left: Other Deformity: No No Prior Foot Ulcer: No No Prior Amputation: No No Charcot Joint: No No Ambulatory Status: Ambulatory Without Help Gait: Electronic Signature(s) Signed: 11/23/2015 4:18:47 PM By: Rebecca Eaton, RN,  [place] Entered By: Rebecca Eaton, RN, Sendra on 11/23/2015 14:12:06 [place], Bufford Lope ([id number]) -------------------------------------------------------------------------------- Nutrition Risk Assessment Details Patient Name: [name] Date of Service: 11/23/2015 1:30 PM Medical Record Number: [id number] Patient Account Number: [id number] Date of Birth/Sex: 20-Sep-1926 (82 y.o. Male) Treating RN: Macarthur Critchley Primary Care Physician: Thalia Party Other Clinician: Referring Physician: Treating Physician/Extender: Frann Rider in Treatment: 0 Height (in): 71 Weight (lbs): 146 Body Mass Index (BMI): 20.4 Nutrition Risk Assessment Items NUTRITION RISK SCREEN: I have an illness or condition that made me change the kind and/or 0 No amount of food I eat I eat fewer than two meals per day 0 No I eat few fruits and vegetables, or milk products 0 No I have three or more drinks of beer, liquor or wine almost every day 0 No I have tooth or mouth problems that make it hard for me to eat 0 No I don't always have enough money to buy the food I need 0 No I eat alone most of the time 0 No I take three or more different prescribed or over-the-counter drugs a 1 Yes day Without wanting to, I have lost or gained 10 pounds in the last six 0 No months I am not always physically able to shop, cook and/or feed myself 2 Yes Nutrition Protocols Good Risk Protocol Provide education on Moderate Risk Protocol 0 nutrition Electronic Signature(s) Signed: 11/23/2015 4:18:47 PM By: Rebecca Eaton RN, Sendra Entered By: Rebecca Eaton RN, Sendra on 11/23/2015 14:03:46

## 2015-11-25 ENCOUNTER — Telehealth: Payer: Self-pay

## 2015-11-25 NOTE — Progress Notes (Addendum)
DYLLAN, SCHLEETER ([id number]) Visit Report for 11/23/2015 Chief Complaint Document Details Patient Name: [name], [name] Date of Service: 11/23/2015 1:30 PM Medical Record Number: [id number] Patient Account Number: [id number] Date of Birth/Sex: [birth year] (82 y.o. Male) Treating RN: Macarthur Critchley Primary Care Physician: Thalia Party Other Clinician: Referring Physician: Treating Physician/Extender: Frann Rider in Treatment: 0 Information Obtained from: Patient Chief Complaint Patient presents for treatment of an open ulcer due to venous insufficiency and advanced necrotic ulcers on the right lower extremity for several months Electronic Signature(s) Signed: 11/23/2015 2:52:44 PM By: Christin Fudge MD, FACS Entered By: Christin Fudge on 11/23/2015 14:52:44 Aguillard, Bufford Lope ([id number]) -------------------------------------------------------------------------------- Debridement Details Patient Name: [name] Date of Service: 11/23/2015 1:30 PM Medical Record Number: [id number] Patient Account Number: [id number] Date of Birth/Sex: 1926-07-18 (82 y.o. Male) Treating RN: Ahmed Prima Primary Care Physician: Thalia Party Other Clinician: Referring Physician: Treating Physician/Extender: Frann Rider in Treatment: 0 Debridement Performed for Wound #2 Left,Distal Lower Leg Assessment: Performed By: Physician Christin Fudge, MD Debridement: Debridement Pre-procedure Yes Verification/Time Out Taken: Start Time: 02:35 Pain Control: Lidocaine 5% topical ointment Level: Skin/Subcutaneous Tissue Total Area Debrided (L x 2 (cm) x 2 (cm) = 4 (cm) W): Tissue and other Viable, Non-Viable, Fibrin/Slough, Skin, Subcutaneous material debrided: Instrument: Forceps, Scissors Bleeding: Minimum Hemostasis Achieved: Pressure End Time: 02:38 Procedural Pain: 7 Post Procedural Pain: 0 Response to Treatment: Procedure was tolerated well Post Debridement Measurements of  Total Wound Length: (cm) 6 Width: (cm) 4.9 Depth: (cm) 0.3 Volume: (cm) 6.927 Post Procedure Diagnosis Same as Pre-procedure Notes once a month of necrotic debris was removed cultures were taken from the depths of the wound Electronic Signature(s) Signed: 11/23/2015 4:59:01 PM By: Alric Quan Signed: 11/24/2015 4:32:01 PM By: Christin Fudge MD, FACS Previous Signature: 11/23/2015 2:52:22 PM Version By: Christin Fudge MD, FACS Previous Signature: 11/23/2015 4:18:47 PM Version By: Rebecca Eaton RN, Sendra Entered By: Alric Quan on 11/23/2015 16:40:44 Trim, Bufford Lope ([id number]) Anspaugh, Bufford Lope ([id number]) -------------------------------------------------------------------------------- HPI Details Patient Name: [name] Date of Service: 11/23/2015 1:30 PM Medical Record Number: [id number] Patient Account Number: [id number] Date of Birth/Sex: 07/03/1926 (82 y.o. Male) Treating RN: Macarthur Critchley Primary Care Physician: Thalia Party Other Clinician: Referring Physician: Treating Physician/Extender: Frann Rider in Treatment: 0 History of Present Illness Location: right lower extremity ulcerations Quality: Patient reports experiencing a sharp pain to affected area(s). Severity: Patient states wound are getting worse. Duration: Patient has had the wound for > 3 months prior to seeking treatment at the wound center Timing: Pain in wound is constant (hurts all the time) Context: The wound appeared gradually over time Modifying Factors: Other treatment(s) tried include:has been seeing hospice nurses for palliative care and for local dressing Associated Signs and Symptoms: Patient reports having difficulty standing for long periods. HPI Description: 82 year old gentleman who was seen by Dr. Marjie Skiff his family medicine PCP and has been last seen in July 2016. In the past the patient has been seen for venous stasis dermatitis of both lower extremities, chronic  systolic congestive heart failure and other symptomatic care. most of the details have been obtained from computer records and it has been noted that the patient has been treated for past medical history of complete heart block, status post pacemaker, atrial fibrillation, ischemic cardiomyopathy, hypothyroidism, COPD, chronic kidney disease and glaucoma. he has never been a smoker. The son Gene was at the bedside is not very certain about his entire management but says the father is under  the care of hospice nurses. Though he the patient nor the son is not entirely clear why hospices treating him we were able to talk to the hospice notes and she told the patient had an advance cardiac problem and is not expected to live more than 6 months. However the patient the family members are not entirely clear about this. Electronic Signature(s) Signed: 11/23/2015 2:54:54 PM By: Christin Fudge MD, FACS Previous Signature: 11/23/2015 2:02:36 PM Version By: Christin Fudge MD, FACS Entered By: Christin Fudge on 11/23/2015 14:54:54 Bonini, Bufford Lope ([id number]) -------------------------------------------------------------------------------- Physical Exam Details Patient Name: [name] Date of Service: 11/23/2015 1:30 PM Medical Record Number: [id number] Patient Account Number: [id number] Date of Birth/Sex: 08/12/1926 (82 y.o. Male) Treating RN: Macarthur Critchley Primary Care Physician: Thalia Party Other Clinician: Referring Physician: Treating Physician/Extender: Frann Rider in Treatment: 0 Constitutional . Pulse regular. Respirations normal and unlabored. Afebrile. . Eyes Nonicteric. Reactive to light. Ears, Nose, Mouth, and Throat Lips, teeth, and gums WNL.Marland Kitchen Moist mucosa without lesions. Neck supple and nontender. No palpable supraclavicular or cervical adenopathy. Normal sized without goiter. Respiratory WNL. No retractions.. Cardiovascular Pedal Pulses WNL. ABIs could not be  measured as it is noncompressible. No clubbing, cyanosis or edema. Gastrointestinal (GI) Abdomen without masses or tenderness.. No liver or spleen enlargement or tenderness.. Lymphatic No adneopathy. No adenopathy. No adenopathy. Musculoskeletal Adexa without tenderness or enlargement.. Digits and nails w/o clubbing, cyanosis, infection, petechiae, ischemia, or inflammatory conditions.. Integumentary (Hair, Skin) No suspicious lesions. No crepitus or fluctuance. No peri-wound warmth or erythema. No masses.Marland Kitchen Psychiatric Judgement and insight Intact.. No evidence of depression, anxiety, or agitation.. Notes extremely necrotic debris over 2 large ulcerations on the right lower extremity with some surrounding cellulitis. Debridement was tried with forceps and scissors but very minimal area was debrided before the patient started having significant amount of pain. Pus cultures were taken from the depths of the wound. Electronic Signature(s) Signed: 11/23/2015 2:56:12 PM By: Christin Fudge MD, FACS Entered By: Christin Fudge on 11/23/2015 14:56:11 Blancett, Bufford Lope ([id number]) -------------------------------------------------------------------------------- Physician Orders Details Patient Name: [name] Date of Service: 11/23/2015 1:30 PM Medical Record Number: [id number] Patient Account Number: [id number] Date of Birth/Sex: 06/14/1926 (82 y.o. Male) Treating RN: Ahmed Prima Primary Care Physician: Thalia Party Other Clinician: Referring Physician: Treating Physician/Extender: Frann Rider in Treatment: 0 Verbal / Phone Orders: Yes ClinicianCarolyne Fiscal, Debi Read Back and Verified: Yes Diagnosis Coding Wound Cleansing Wound #1 Right,Proximal Lower Leg o Clean wound with Normal Saline. Wound #2 Left,Distal Lower Leg o Clean wound with Normal Saline. Anesthetic Wound #1 Right,Proximal Lower Leg o Topical Lidocaine 4% cream applied to wound bed prior to  debridement Wound #2 Left,Distal Lower Leg o Topical Lidocaine 4% cream applied to wound bed prior to debridement Skin Barriers/Peri-Wound Care Wound #1 Right,Proximal Lower Leg o Barrier cream Wound #2 Left,Distal Lower Leg o Barrier cream Primary Wound Dressing Wound #1 Right,Proximal Lower Leg o Santyl Ointment Wound #2 Left,Distal Lower Leg o Santyl Ointment Secondary Dressing Wound #1 Right,Proximal Lower Leg o ABD pad o Gauze and Kerlix/Conform Wound #2 Left,Distal Lower Leg o ABD pad o Gauze and Kerlix/Conform Yamaguchi, Ronie W. ([id number]) Dressing Change Frequency Wound #1 Right,Proximal Lower Leg o Change dressing every day. Wound #2 Left,Distal Lower Leg o Change dressing every day. Follow-up Appointments Wound #1 Right,Proximal Lower Leg o Return Appointment in 1 week. Wound #2 Left,Distal Lower Leg o Return Appointment in 1 week. Laboratory o Culture and Sensitivity - from wound Notes  Pt is currently on Hospice care. Pt is to talk over wound care and what things need to be done with wound and wound healing with family and Hospice so we can decide on how to treat the wound. Electronic Signature(s) Signed: 11/23/2015 4:09:33 PM By: Christin Fudge MD, FACS Signed: 11/23/2015 4:59:01 PM By: Alric Quan Entered By: Alric Quan on 11/23/2015 14:46:44 Runkles, Bufford Lope ([id number]) -------------------------------------------------------------------------------- Problem List Details Patient Name: [name] Date of Service: 11/23/2015 1:30 PM Medical Record Number: [id number] Patient Account Number: [id number] Date of Birth/Sex: 1926/03/08 (82 y.o. Male) Treating RN: Macarthur Critchley Primary Care Physician: Thalia Party Other Clinician: Referring Physician: Treating Physician/Extender: Frann Rider in Treatment: 0 Active Problems ICD-10 Encounter Code Description Active Date Diagnosis I83.212 Varicose veins of  right lower extremity with both ulcer of 11/23/2015 Yes calf and inflammation [id number] Chronic systolic (congestive) heart failure 11/23/2015 Yes Inactive Problems Resolved Problems Electronic Signature(s) Signed: 11/23/2015 2:50:50 PM By: Christin Fudge MD, FACS Entered By: Christin Fudge on 11/23/2015 14:50:50 Jungman, Bufford Lope ([id number]) -------------------------------------------------------------------------------- Progress Note Details Patient Name: [name] Date of Service: 11/23/2015 1:30 PM Medical Record Number: [id number] Patient Account Number: [id number] Date of Birth/Sex: 1925/11/30 (82 y.o. Male) Treating RN: Macarthur Critchley Primary Care Physician: Thalia Party Other Clinician: Referring Physician: Treating Physician/Extender: Frann Rider in Treatment: 0 Subjective Chief Complaint Information obtained from Patient Patient presents for treatment of an open ulcer due to venous insufficiency and advanced necrotic ulcers on the right lower extremity for several months History of Present Illness (HPI) The following HPI elements were documented for the patient's wound: Location: right lower extremity ulcerations Quality: Patient reports experiencing a sharp pain to affected area(s). Severity: Patient states wound are getting worse. Duration: Patient has had the wound for > 3 months prior to seeking treatment at the wound center Timing: Pain in wound is constant (hurts all the time) Context: The wound appeared gradually over time Modifying Factors: Other treatment(s) tried include:has been seeing hospice nurses for palliative care and for local dressing Associated Signs and Symptoms: Patient reports having difficulty standing for long periods. 82 year old gentleman who was seen by Dr. Marjie Skiff his family medicine PCP and has been last seen in July 2016. In the past the patient has been seen for venous stasis dermatitis of both lower extremities, chronic systolic  congestive heart failure and other symptomatic care. most of the details have been obtained from computer records and it has been noted that the patient has been treated for past medical history of complete heart block, status post pacemaker, atrial fibrillation, ischemic cardiomyopathy, hypothyroidism, COPD, chronic kidney disease and glaucoma. he has never been a smoker. The son Gene was at the bedside is not very certain about his entire management but says the father is under the care of hospice nurses. Though he the patient nor the son is not entirely clear why hospices treating him we were able to talk to the hospice notes and she told the patient had an advance cardiac problem and is not expected to live more than 6 months. However the patient the family members are not entirely clear about this. Wound History Patient presents with 1 open wound that has been present for approximately 1 month. Patient has been treating wound in the following manner: santyl. Laboratory tests have not been performed in the last month. Patient reportedly has not tested positive for an antibiotic resistant organism. Patient reportedly has not tested positive for osteomyelitis. Patient reportedly has had testing  performed to evaluate circulation in the legs. OLUWATAMILORE, MONN ([id number]) Patient History Information obtained from Patient, Caregiver. Allergies No allergies have been documented for the patient General Notes: unable to verify allergies Social History Never smoker, Marital Status - Widowed, Alcohol Use - Daily, Drug Use - No History, Caffeine Use - Moderate. Medical History Eyes Patient has history of Cataracts, Glaucoma Denies history of Optic Neuritis Ear/Nose/Mouth/Throat Denies history of Chronic sinus problems/congestion, Middle ear problems Hematologic/Lymphatic Denies history of Anemia, Hemophilia, Human Immunodeficiency Virus, Lymphedema, Sickle Cell Disease Respiratory Denies  history of Aspiration, Asthma, Chronic Obstructive Pulmonary Disease (COPD), Pneumothorax, Sleep Apnea, Tuberculosis Cardiovascular Patient has history of Hypertension Gastrointestinal Denies history of Cirrhosis , Colitis, Crohn s, Hepatitis A, Hepatitis B, Hepatitis C Endocrine Denies history of Type I Diabetes, Type II Diabetes Genitourinary Denies history of End Stage Renal Disease Immunological Denies history of Lupus Erythematosus, Raynaud s, Scleroderma Integumentary (Skin) Denies history of History of Burn, History of pressure wounds Musculoskeletal Denies history of Gout, Rheumatoid Arthritis, Osteoarthritis, Osteomyelitis Neurologic Denies history of Dementia, Neuropathy, Quadriplegia, Paraplegia, Seizure Disorder Oncologic Denies history of Received Chemotherapy, Received Radiation Psychiatric Denies history of Anorexia/bulimia, Confinement Anxiety Medical And Surgical History Notes Cardiovascular bypass Review of Systems (ROS) Constitutional Symptoms (General Health) The patient has no complaints or symptoms. Caterino, ELDWIN DORAN ([id number]) Eyes The patient has no complaints or symptoms. Ear/Nose/Mouth/Throat The patient has no complaints or symptoms. Hematologic/Lymphatic The patient has no complaints or symptoms. Respiratory The patient has no complaints or symptoms. Cardiovascular Complains or has symptoms of LE edema - right leg. Gastrointestinal The patient has no complaints or symptoms. Endocrine The patient has no complaints or symptoms. Genitourinary The patient has no complaints or symptoms. Immunological The patient has no complaints or symptoms. Integumentary (Skin) Complains or has symptoms of Wounds - right leg. Musculoskeletal The patient has no complaints or symptoms. Neurologic The patient has no complaints or symptoms. Oncologic The patient has no complaints or symptoms. Psychiatric The patient has no complaints or  symptoms. Medications warfarin 5 mg tablet oral 1 1 tablet oral Symbicort 160 mcg-4.5 mcg/actuation HFA aerosol inhaler inhalation HFA aerosol inhaler inhalation digoxin 125 mcg tablet oral 1 1 tablet oral allopurinol 300 mg tablet oral tablet oral Stool Softener 100 mg tablet oral 4 4 tablet oral furosemide 80 mg tablet oral 1 1 tablet oral Klor-Con 10 mEq tablet,extended release oral 4 4 tablet extended release oral Protonix 40 mg tablet,delayed release oral 1 1 tablet,delayed release (DR/EC) oral metolazone 2.5 mg tablet oral 1 1 tablet oral Synthroid 125 mcg tablet oral 1 1 tablet oral Objective Iezzi, Angad W. ([id number]) Constitutional Pulse regular. Respirations normal and unlabored. Afebrile. Vitals Time Taken: 1:49 PM, Height: 71 in, Source: Stated, Weight: 146 lbs, Source: Stated, BMI: 20.4, Pulse: 72 bpm, Blood Pressure: 99/54 mmHg. Eyes Nonicteric. Reactive to light. Ears, Nose, Mouth, and Throat Lips, teeth, and gums WNL.Marland Kitchen Moist mucosa without lesions. Neck supple and nontender. No palpable supraclavicular or cervical adenopathy. Normal sized without goiter. Respiratory WNL. No retractions.. Cardiovascular Pedal Pulses WNL. ABIs could not be measured as it is noncompressible. No clubbing, cyanosis or edema. Gastrointestinal (GI) Abdomen without masses or tenderness.. No liver or spleen enlargement or tenderness.. Lymphatic No adneopathy. No adenopathy. No adenopathy. Musculoskeletal Adexa without tenderness or enlargement.. Digits and nails w/o clubbing, cyanosis, infection, petechiae, ischemia, or inflammatory conditions.Marland Kitchen Psychiatric Judgement and insight Intact.. No evidence of depression, anxiety, or agitation.. General Notes: extremely necrotic debris over 2 large ulcerations on the right  lower extremity with some surrounding cellulitis. Debridement was tried with forceps and scissors but very minimal area was debrided before the patient started having  significant amount of pain. Pus cultures were taken from the depths of the wound. Integumentary (Hair, Skin) No suspicious lesions. No crepitus or fluctuance. No peri-wound warmth or erythema. No masses.. Wound #1 status is Open. Original cause of wound was Blister. The wound is located on the Right,Proximal Lower Leg. The wound measures 4.1cm length x 3.9cm width x 0.2cm depth; 12.559cm^2 area and 2.512cm^3 volume. There is no tunneling or undermining noted. There is a small amount of serosanguineous drainage noted. The wound margin is flat and intact. There is no granulation within the wound bed. There is a large (67-100%) amount of necrotic tissue within the wound bed including Adherent Slough. The periwound skin appearance exhibited: Hemosiderin Staining, Erythema. The periwound skin Modi, ZACHARAY DRAGONE. ([id number]) appearance did not exhibit: Dry/Scaly, Maceration, Moist. The surrounding wound skin color is noted with erythema which is circumferential. Periwound temperature was noted as No Abnormality. The periwound has tenderness on palpation. Wound #2 status is Open. Original cause of wound was Blister. The wound is located on the Left,Distal Lower Leg. The wound measures 6cm length x 4.9cm width x 0.3cm depth; 23.091cm^2 area and 6.927cm^3 volume. There is no tunneling or undermining noted. There is a small amount of serosanguineous drainage noted. The wound margin is flat and intact. There is no granulation within the wound bed. There is a large (67-100%) amount of necrotic tissue within the wound bed including Adherent Slough. The periwound skin appearance exhibited: Hemosiderin Staining. The periwound skin appearance did not exhibit: Dry/Scaly, Maceration, Moist. The periwound has tenderness on palpation. Assessment Active Problems ICD-10 I83.212 - Varicose veins of right lower extremity with both ulcer of calf and inflammation [id number] - Chronic systolic (congestive) heart  failure Procedures Wound #2 Wound #2 is a Venous Leg Ulcer located on the Left,Distal Lower Leg . There was a Skin/Subcutaneous Tissue Debridement [id number]) debridement with total area of 4 sq cm performed by Christin Fudge, MD. with the following instrument(s): Forceps and Scissors to remove Viable and Non-Viable tissue/material including Fibrin/Slough, Skin, and Subcutaneous after achieving pain control using Lidocaine 5% topical ointment. A time out was conducted prior to the start of the procedure. A Minimum amount of bleeding was controlled with Pressure. The procedure was tolerated well with a pain level of 7 throughout and a pain level of 0 following the procedure. Post Debridement Measurements: 6cm length x 4.9cm width x 0.3cm depth; 6.927cm^3 volume. Post procedure Diagnosis Wound #2: Same as Pre-Procedure General Notes: once a month of necrotic debris was removed cultures were taken from the depths of the wound. Plan Goyer, ALVYN CRYTZER. ([id number]) Wound Cleansing: Wound #1 Right,Proximal Lower Leg: Clean wound with Normal Saline. Wound #2 Left,Distal Lower Leg: Clean wound with Normal Saline. Anesthetic: Wound #1 Right,Proximal Lower Leg: Topical Lidocaine 4% cream applied to wound bed prior to debridement Wound #2 Left,Distal Lower Leg: Topical Lidocaine 4% cream applied to wound bed prior to debridement Skin Barriers/Peri-Wound Care: Wound #1 Right,Proximal Lower Leg: Barrier cream Wound #2 Left,Distal Lower Leg: Barrier cream Primary Wound Dressing: Wound #1 Right,Proximal Lower Leg: Santyl Ointment Wound #2 Left,Distal Lower Leg: Santyl Ointment Secondary Dressing: Wound #1 Right,Proximal Lower Leg: ABD pad Gauze and Kerlix/Conform Wound #2 Left,Distal Lower Leg: ABD pad Gauze and Kerlix/Conform Dressing Change Frequency: Wound #1 Right,Proximal Lower Leg: Change dressing every day. Wound #2 Left,Distal Lower Leg: Change dressing  every day. Follow-up  Appointments: Wound #1 Right,Proximal Lower Leg: Return Appointment in 1 week. Wound #2 Left,Distal Lower Leg: Return Appointment in 1 week. Laboratory ordered were: Culture and Sensitivity - from wound General Notes: Pt is currently on Hospice care. Pt is to talk over wound care and what things need to be done with wound and wound healing with family and Hospice so we can decide on how to treat the wound. This 82 year old gentleman who comes as a self-referral to the wound center was unable to provide as much history and the son was at the bedside does not seem to know very much about the father's KYVEN, LUTTON. ([id number]) condition. We were unable to get hold of her sister who knows more of his medical care. We also spoke to one of the hospice nurses over the phone who tells [place] that the patient has advanced cardiac problem and hence was referred to hospice as his longevity is not more than 6 months. The patient and his son at the bedside are not aware of this. If he is to continue hospice care then Santyl ointment locally and local compression will be appropriate. however if aggressive therapy is desired then I would recommend: 1. Arterial and venous duplex studies of the lower extremity. 2. Surgical debridement in the operating room under IV sedation 3. Once vascular reports are available appropriate compression therapy to be applied for venous stasis ulcers and once clean we could use skin substitutes to further help healing. 4. Wound culture has been taken from the depths today and depending on this appropriate antibiotic to be given I've asked the patient, son and daughter to have a family discussion and let is know how he wants to proceed. Electronic Signature(s) Signed: 11/24/2015 4:34:10 PM By: Christin Fudge MD, FACS Previous Signature: 11/23/2015 3:00:22 PM Version By: Christin Fudge MD, FACS Entered By: Christin Fudge on 11/24/2015 16:34:10 Sevillano, Bufford Lope  ([id number]) -------------------------------------------------------------------------------- ROS/PFSH Details Patient Name: [name] Date of Service: 11/23/2015 1:30 PM Medical Record Number: [id number] Patient Account Number: [id number] Date of Birth/Sex: 02-02-1926 (82 y.o. Male) Treating RN: Macarthur Critchley Primary Care Physician: Thalia Party Other Clinician: Referring Physician: Treating Physician/Extender: Frann Rider in Treatment: 0 Information Obtained From Patient Caregiver Wound History Do you currently have one or more open woundso Yes How many open wounds do you currently haveo 1 Approximately how long have you had your woundso 1 month How have you been treating your wound(s) until nowo santyl Has your wound(s) ever healed and then re-openedo No Have you had any lab work done in the past montho No Have you tested positive for an antibiotic resistant organism (MRSA, VRE)o No Have you tested positive for osteomyelitis (bone infection)o No Have you had any tests for circulation on your legso Yes Cardiovascular Complaints and Symptoms: Positive for: LE edema - right leg Medical History: Positive for: Hypertension Past Medical History Notes: bypass Integumentary (Skin) Complaints and Symptoms: Positive for: Wounds - right leg Medical History: Negative for: History of Burn; History of pressure wounds Constitutional Symptoms (General Health) Complaints and Symptoms: No Complaints or Symptoms Eyes Complaints and Symptoms: No Complaints or Symptoms Medical History: ISABEL, MCGRUE ([id number]) Positive for: Cataracts; Glaucoma Negative for: Optic Neuritis Ear/Nose/Mouth/Throat Complaints and Symptoms: No Complaints or Symptoms Medical History: Negative for: Chronic sinus problems/congestion; Middle ear problems Hematologic/Lymphatic Complaints and Symptoms: No Complaints or Symptoms Medical History: Negative for: Anemia; Hemophilia; Human  Immunodeficiency Virus; Lymphedema; Sickle Cell Disease Respiratory Complaints and Symptoms:  No Complaints or Symptoms Medical History: Negative for: Aspiration; Asthma; Chronic Obstructive Pulmonary Disease (COPD); Pneumothorax; Sleep Apnea; Tuberculosis Gastrointestinal Complaints and Symptoms: No Complaints or Symptoms Medical History: Negative for: Cirrhosis ; Colitis; Crohnos; Hepatitis A; Hepatitis B; Hepatitis C Endocrine Complaints and Symptoms: No Complaints or Symptoms Medical History: Negative for: Type I Diabetes; Type II Diabetes Genitourinary Complaints and Symptoms: No Complaints or Symptoms Medical History: Negative for: End Stage Renal Disease Franklyn, DANEAL LAROCK. ([id number]) Immunological Complaints and Symptoms: No Complaints or Symptoms Medical History: Negative for: Lupus Erythematosus; Raynaudos; Scleroderma Musculoskeletal Complaints and Symptoms: No Complaints or Symptoms Medical History: Negative for: Gout; Rheumatoid Arthritis; Osteoarthritis; Osteomyelitis Neurologic Complaints and Symptoms: No Complaints or Symptoms Medical History: Negative for: Dementia; Neuropathy; Quadriplegia; Paraplegia; Seizure Disorder Oncologic Complaints and Symptoms: No Complaints or Symptoms Medical History: Negative for: Received Chemotherapy; Received Radiation Psychiatric Complaints and Symptoms: No Complaints or Symptoms Medical History: Negative for: Anorexia/bulimia; Confinement Anxiety HBO Extended History Items Eyes: Eyes: Cataracts Glaucoma Immunizations Immunization Notes: up to date Family and Social History Never smoker; Marital Status - Widowed; Alcohol Use: Daily; Drug Use: No History; Caffeine Use: Moderate; Financial Concerns: No; Food, Clothing or Shelter Needs: No; Support System Lacking: No; Copland, IVORY THORMAN. ([id number]) Transportation Concerns: No; Advanced Directives: Yes (Not Provided); Patient does not want information on Advanced  Directives; Medical Power of Attorney: Yes (Not Provided) Physician Affirmation I have reviewed and agree with the above information. Electronic Signature(s) Signed: 11/23/2015 2:19:12 PM By: Christin Fudge MD, FACS Signed: 11/23/2015 4:18:47 PM By: Rebecca Eaton RN, [place] Entered By: Christin Fudge on 11/23/2015 14:19:11 Williard, Bufford Lope ([id number]) -------------------------------------------------------------------------------- SuperBill Details Patient Name: [name] Date of Service: 11/23/2015 Medical Record Number: [id number] Patient Account Number: [id number] Date of Birth/Sex: 1926-06-30 (82 y.o. 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 [id number] Chronic systolic (congestive) heart  failure Physician Procedures CPT4: Description Modifier Quantity Code [id number] - WC PHYS LEVEL 4 - NEW PT 1 ICD-10 Description Diagnosis I83.212 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

## 2015-11-25 NOTE — Telephone Encounter (Signed)
Faxed signed order to hospice

## 2015-11-27 ENCOUNTER — Telehealth: Payer: Self-pay | Admitting: *Deleted

## 2015-11-27 ENCOUNTER — Encounter: Payer: Self-pay | Admitting: Cardiovascular Disease

## 2015-11-27 NOTE — Telephone Encounter (Signed)
Signed order for wound care, S/N, PT/INR and Medical Social Worker consultation faxed to [facility].

## 2015-11-28 ENCOUNTER — Telehealth: Payer: Self-pay | Admitting: Adult Health

## 2015-11-28 LAB — WOUND CULTURE

## 2015-11-28 NOTE — Telephone Encounter (Signed)
Received phone call from hospice RN caring for patient. . She reports increased swelling, dyspnea, and decreased urine output.   She has contacted the hospice physician , who has asked them to call us concerning increasing his doses of metolazone. He is currently on metolazone 2.5 mg 3 times a week. He is also on Lasix 80 mg by mouth twice a day.   I have given her orders for oxygen 2 L, to increase the metolazone to 5 mg 3 times a day and make sure he receives his Lasix as he had not received if that morning. He will have a follow-up BMET on Monday, January 16. Results will be called to our office.

## 2015-11-30 ENCOUNTER — Telehealth: Payer: Self-pay | Admitting: Cardiovascular Disease

## 2015-11-30 NOTE — Telephone Encounter (Signed)
ON Friday FLARE OF CHF SHORTNESS OF BREATH Had extra metolazone Family has opted completely comfort care and no medications Looks good tiday Has Morphine and ativan as needed Declined labs He has stopped coumadin [name] with [facility] called with this report

## 2015-12-01 ENCOUNTER — Ambulatory Visit: Payer: Medicare Other | Admitting: Surgery

## 2015-12-02 ENCOUNTER — Telehealth: Payer: Self-pay

## 2015-12-02 NOTE — Telephone Encounter (Signed)
Call to daughter.  She reported they are now only providing comfort measures to patient and he is at end of life.  Hospice continues in the home.  She requested no further remote transmissions at this point.  She thanked me for assisting her and patient and feels this program has been so beneficial for her.  She stated Dr Sallyanne Kuster has been very good and was appreciative of all he has done.  She stated she thinks her father is ready to go and expecting him to pass in the next few days.  She stated she will call and let us know when that happens.    Received voice mail message from daughter [name].  She requested a call back.

## 2015-12-04 ENCOUNTER — Telehealth: Payer: Self-pay | Admitting: Cardiovascular Disease

## 2015-12-04 MED ORDER — LORAZEPAM 0.5 MG PO TABS: 0.5000 mg | ORAL_TABLET | Freq: Four times a day (QID) | ORAL | Status: AC | PRN

## 2015-12-04 NOTE — Telephone Encounter (Signed)
Please refill. #30. One tab every 6 hours as needed

## 2015-12-04 NOTE — Telephone Encounter (Signed)
°*  STAT* If patient is at the pharmacy, call can be transferred to refill team.   1. Which medications need to be refilled? (please list name of each medication and dose if known) Ativan 0.5mg    2. Which pharmacy/location (including street and city if local pharmacy) is medication to be sent to? [place] [facility] [phone] 3. Do they need a 30 day or 90 day supply? [place]

## 2015-12-04 NOTE — Telephone Encounter (Signed)
Refill sent to the pharmacy electronically.  

## 2015-12-04 NOTE — Telephone Encounter (Signed)
Will forward to dr croitoru for approval

## 2015-12-07 ENCOUNTER — Telehealth: Payer: Self-pay | Admitting: Cardiovascular Disease

## 2015-12-07 NOTE — Telephone Encounter (Signed)
A nurse from [facility] called in stating that the pt expired on 1/21 @ 9:28pm.

## 2015-12-07 NOTE — Telephone Encounter (Addendum)
12/07/2015 Received Death Certificate from [place] for Dr. Sallyanne Kuster to fill out on patient I then gave death certificate to [facility] for Dr. Loletha Grayer to fill out. cbr  01//24/2017 Received signed Death Certificate from Dr. Sallyanne Kuster, I then made copy for our records and called [facility] to pick up. cbr

## 2015-12-11 ENCOUNTER — Telehealth: Payer: Self-pay | Admitting: *Deleted

## 2015-12-11 NOTE — Telephone Encounter (Signed)
Order signed and  faxed for SN wound care d/c'd on 12/15/2015.  Signed orders for medication changes faxed 12/03/15 and 12/11/15.

## 2015-12-14 ENCOUNTER — Encounter (HOSPITAL_BASED_OUTPATIENT_CLINIC_OR_DEPARTMENT_OTHER): Payer: Medicare Other

## 2015-12-16 DEATH — deceased

## 2016-01-19 ENCOUNTER — Encounter: Payer: Medicare Other | Admitting: Cardiovascular Disease

## 2016-01-23 ENCOUNTER — Other Ambulatory Visit: Payer: Self-pay | Admitting: Cardiovascular Disease

## 2016-12-12 IMAGING — CR DG TIBIA/FIBULA 2V*L*
3 series · 3 of 3 positions shown · non-contrast
Comparison: None.

CLINICAL DATA: Nonhealing ulcer anterior and posterior distal left
tibia

EXAM:
LEFT TIBIA AND FIBULA - 2 VIEW

[view not recorded (1 of 3)]
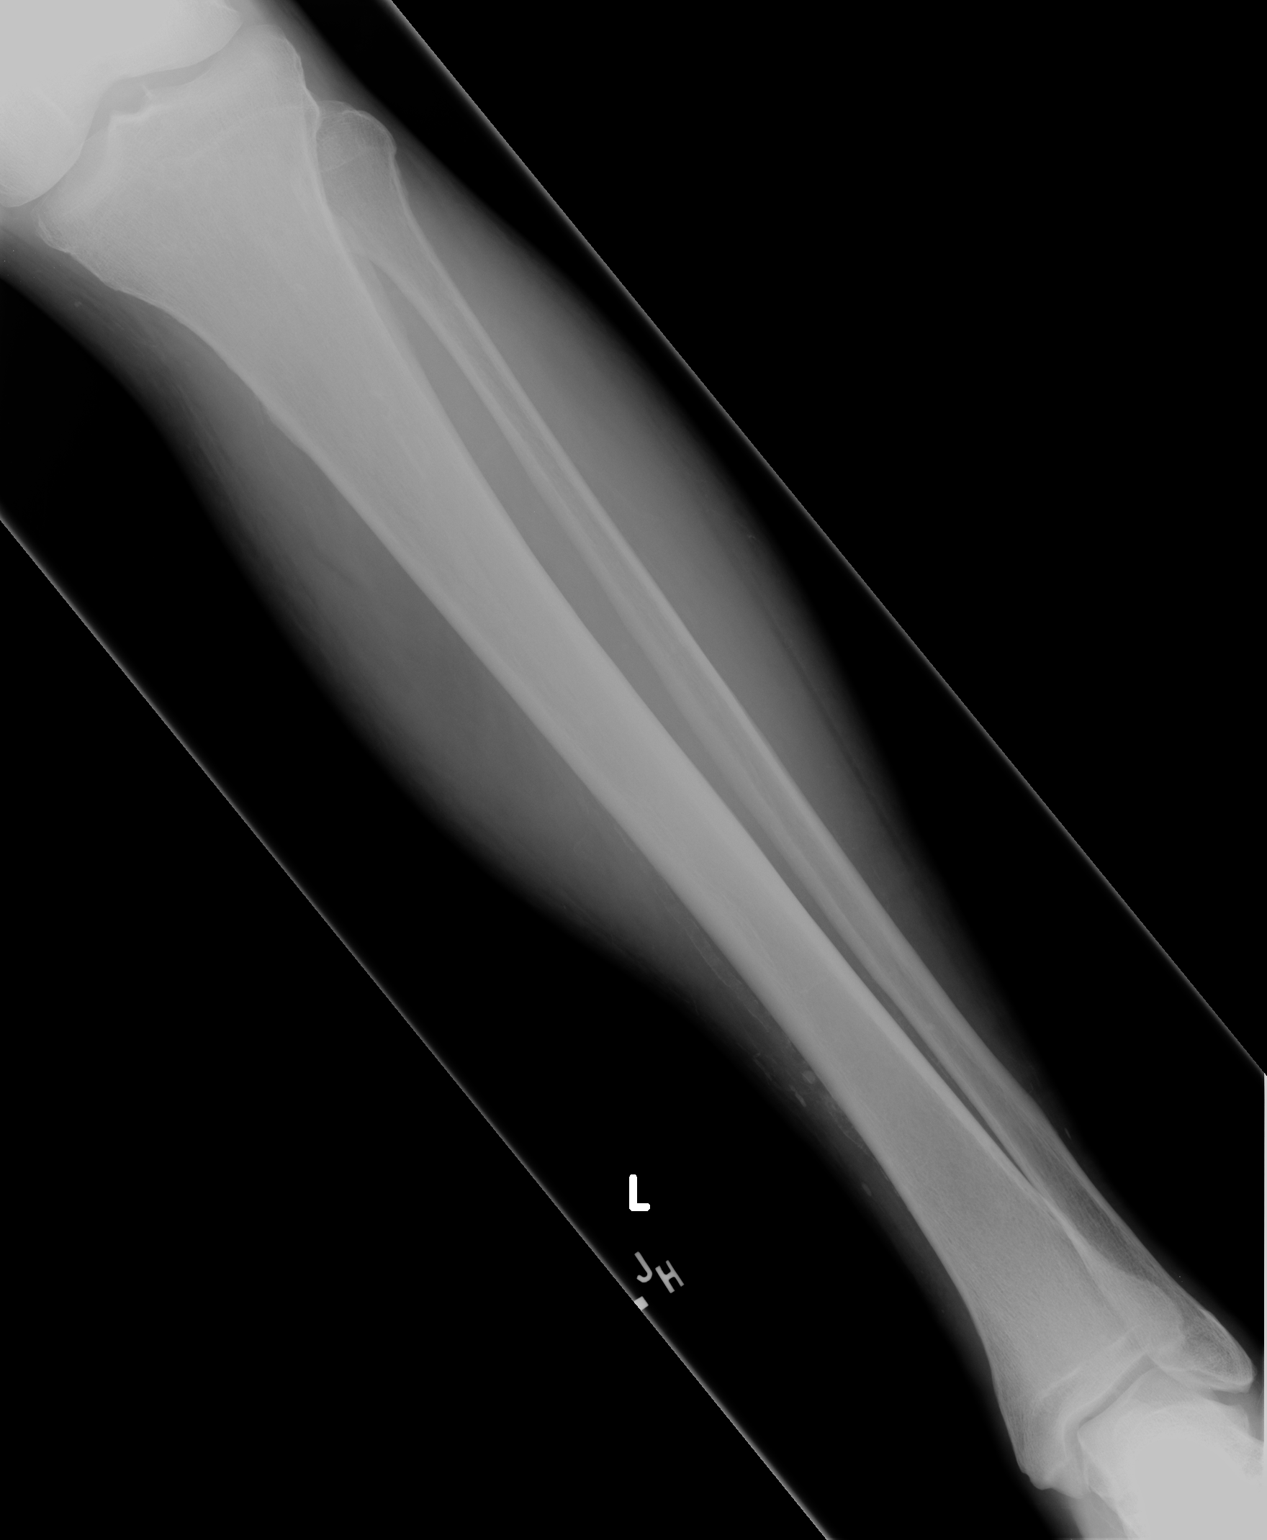

[view not recorded (2 of 3)]
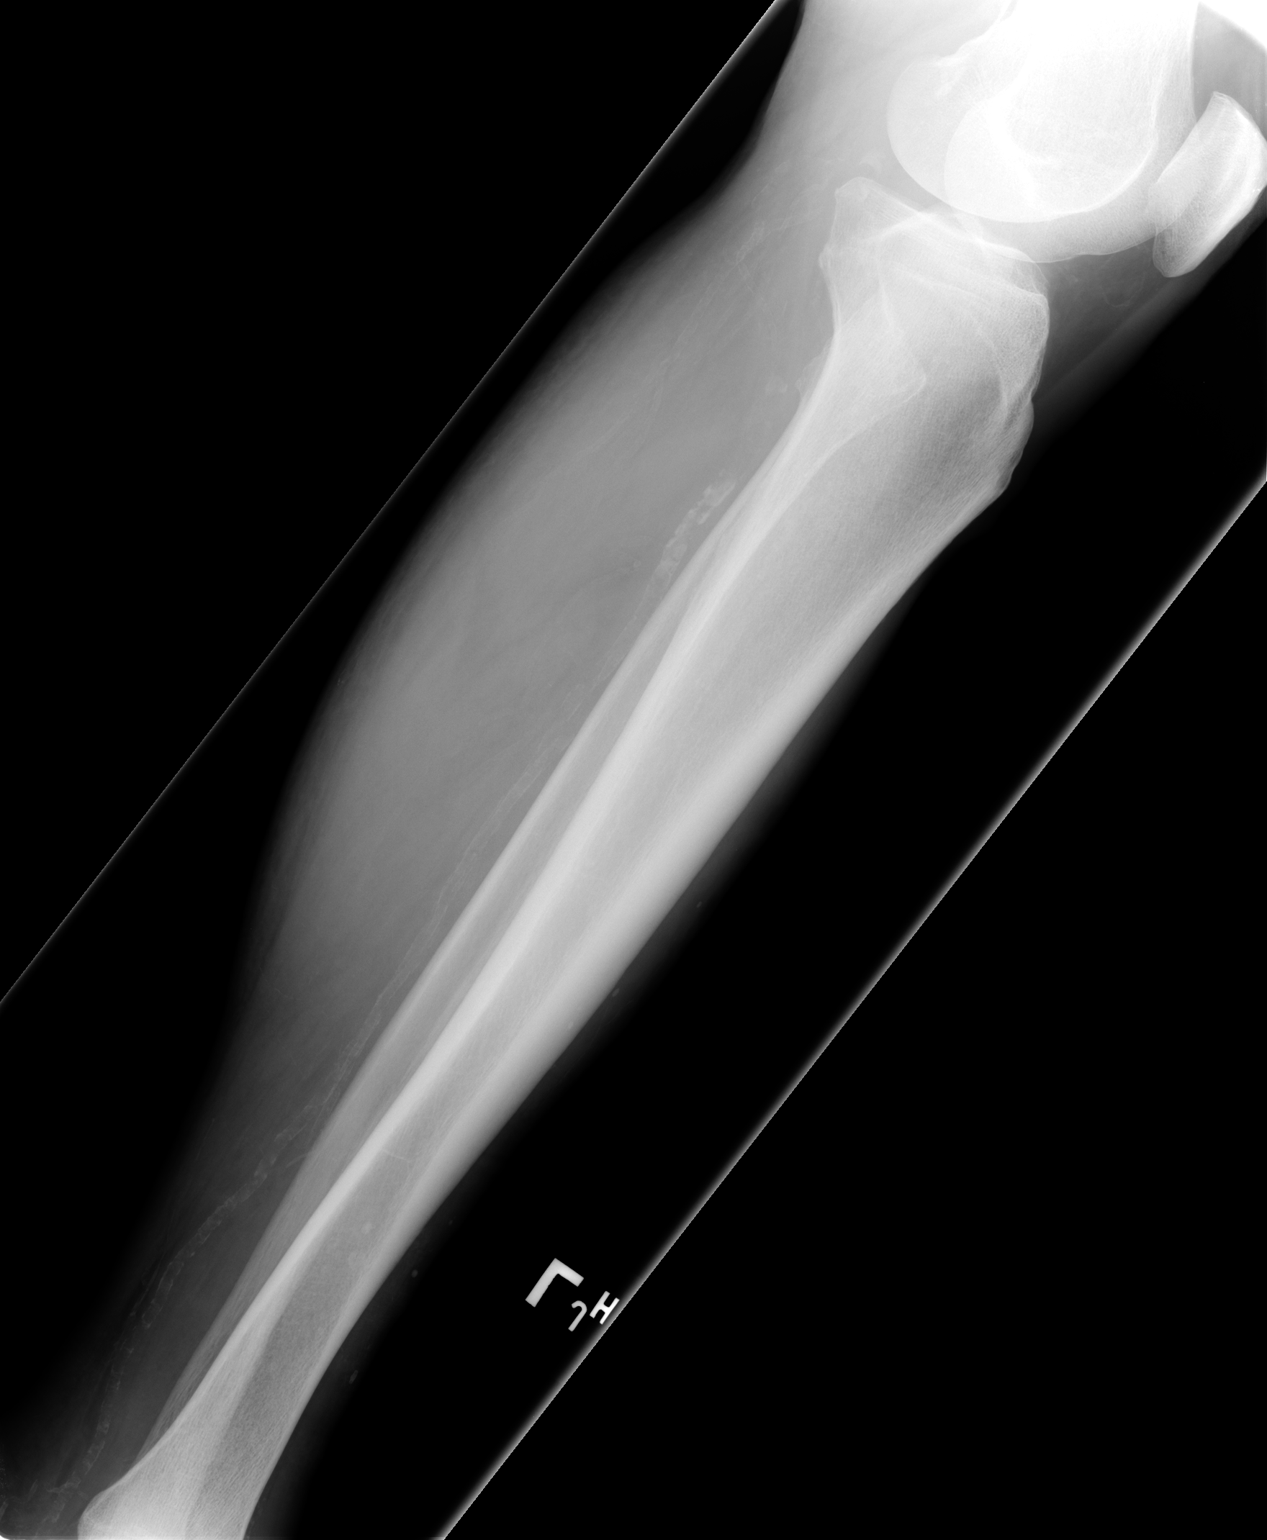

[view not recorded (3 of 3)]
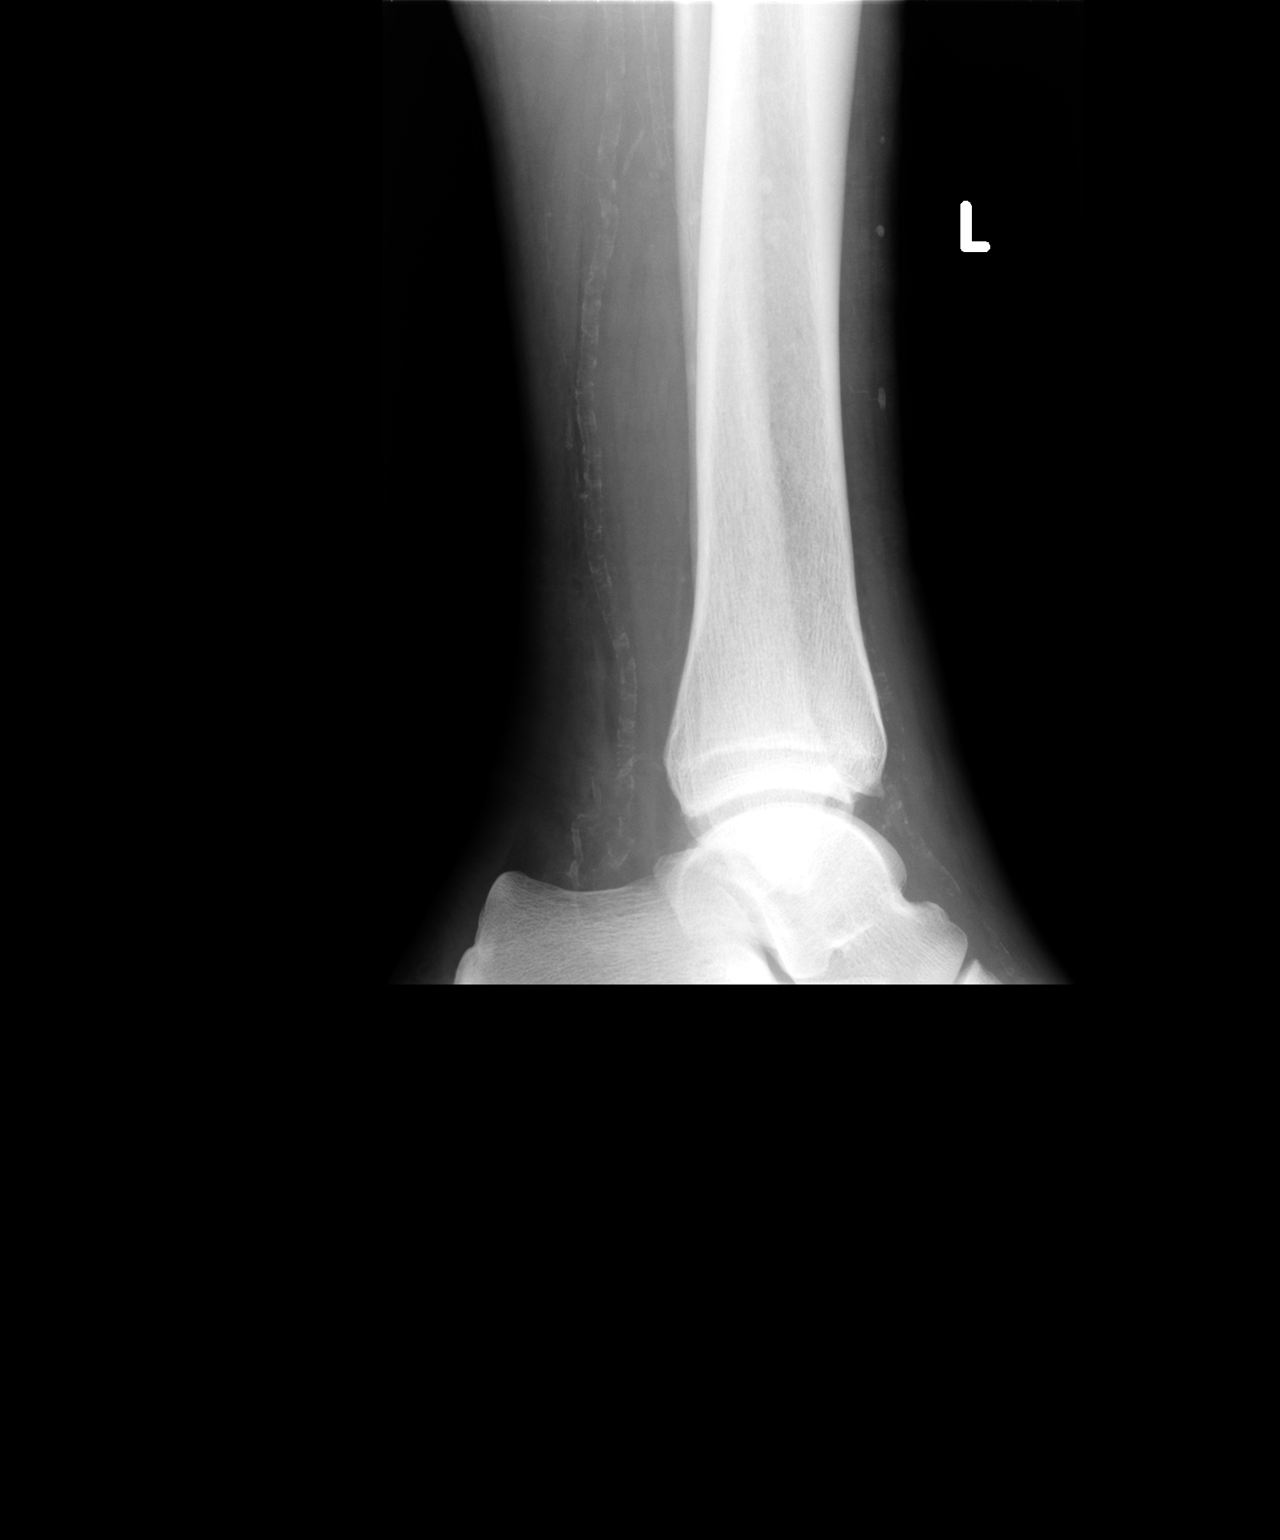

[3 of 3 positions shown; findings below may reference images not displayed]

FINDINGS: Three views of the left tibia fibula submitted. No acute fracture or
subluxation. Atherosclerotic vascular calcifications are noted. No
bone destruction or periosteal reaction to suggest osteomyelitis.
IMPRESSION: No acute fracture or subluxation. No evidence of osteomyelitis or
bone destruction. Atherosclerotic vascular calcifications.
